# Patient Record
Sex: Male | Born: 1945 | Race: White | Hispanic: No | Smoking: Former smoker
Health system: Southern US, Community
[De-identification: ages and names within clinical notes are randomized; demographics above are authoritative.]

## PROBLEM LIST (undated history)

## (undated) DIAGNOSIS — I509 Heart failure, unspecified: Secondary | ICD-10-CM

## (undated) DIAGNOSIS — J84112 Idiopathic pulmonary fibrosis: Secondary | ICD-10-CM

## (undated) DIAGNOSIS — J449 Chronic obstructive pulmonary disease, unspecified: Secondary | ICD-10-CM

## (undated) DIAGNOSIS — E7800 Pure hypercholesterolemia, unspecified: Secondary | ICD-10-CM

## (undated) DIAGNOSIS — E119 Type 2 diabetes mellitus without complications: Secondary | ICD-10-CM

## (undated) DIAGNOSIS — R011 Cardiac murmur, unspecified: Secondary | ICD-10-CM

## (undated) DIAGNOSIS — F329 Major depressive disorder, single episode, unspecified: Secondary | ICD-10-CM

## (undated) DIAGNOSIS — I119 Hypertensive heart disease without heart failure: Secondary | ICD-10-CM

## (undated) DIAGNOSIS — Z659 Problem related to unspecified psychosocial circumstances: Secondary | ICD-10-CM

## (undated) DIAGNOSIS — R079 Chest pain, unspecified: Secondary | ICD-10-CM

## (undated) DIAGNOSIS — I10 Essential (primary) hypertension: Secondary | ICD-10-CM

## (undated) DIAGNOSIS — F32A Depression, unspecified: Secondary | ICD-10-CM

## (undated) DIAGNOSIS — K219 Gastro-esophageal reflux disease without esophagitis: Secondary | ICD-10-CM

## (undated) DIAGNOSIS — I2510 Atherosclerotic heart disease of native coronary artery without angina pectoris: Secondary | ICD-10-CM

## (undated) DIAGNOSIS — R001 Bradycardia, unspecified: Secondary | ICD-10-CM

## (undated) DIAGNOSIS — F419 Anxiety disorder, unspecified: Secondary | ICD-10-CM

## (undated) HISTORY — DX: Anxiety disorder, unspecified: F41.9

## (undated) HISTORY — PX: LUNG BIOPSY: SHX232

## (undated) HISTORY — DX: Pure hypercholesterolemia, unspecified: E78.00

## (undated) HISTORY — DX: Chest pain, unspecified: R07.9

## (undated) HISTORY — PX: CARDIAC CATHETERIZATION: SHX172

## (undated) HISTORY — DX: Major depressive disorder, single episode, unspecified: F32.9

## (undated) HISTORY — DX: Depression, unspecified: F32.A

## (undated) HISTORY — DX: Bradycardia, unspecified: R00.1

## (undated) HISTORY — DX: Hypertensive heart disease without heart failure: I11.9

## (undated) HISTORY — DX: Problem related to unspecified psychosocial circumstances: Z65.9

## (undated) HISTORY — PX: CORONARY ANGIOPLASTY: SHX604

## (undated) HISTORY — DX: Idiopathic pulmonary fibrosis: J84.112

## (undated) HISTORY — PX: OTHER SURGICAL HISTORY: SHX169

## (undated) HISTORY — PX: TESTICLE SURGERY: SHX794

## (undated) HISTORY — PX: EYE SURGERY: SHX253

## (undated) HISTORY — DX: Gastro-esophageal reflux disease without esophagitis: K21.9

## (undated) HISTORY — DX: Atherosclerotic heart disease of native coronary artery without angina pectoris: I25.10

---

## 2003-04-02 ENCOUNTER — Other Ambulatory Visit: Payer: Self-pay

## 2004-01-28 ENCOUNTER — Other Ambulatory Visit: Payer: Self-pay

## 2004-07-29 ENCOUNTER — Other Ambulatory Visit: Payer: Self-pay

## 2004-07-29 ENCOUNTER — Emergency Department: Payer: Self-pay | Admitting: Emergency Medicine

## 2005-02-02 DIAGNOSIS — F419 Anxiety disorder, unspecified: Secondary | ICD-10-CM | POA: Insufficient documentation

## 2005-02-02 DIAGNOSIS — F329 Major depressive disorder, single episode, unspecified: Secondary | ICD-10-CM | POA: Insufficient documentation

## 2006-01-27 ENCOUNTER — Encounter: Payer: Self-pay | Admitting: Cardiovascular Disease

## 2006-04-11 ENCOUNTER — Ambulatory Visit: Payer: Self-pay | Admitting: Ophthalmology

## 2006-04-18 ENCOUNTER — Ambulatory Visit: Payer: Self-pay | Admitting: Ophthalmology

## 2006-06-01 ENCOUNTER — Encounter: Payer: Self-pay | Admitting: Cardiovascular Disease

## 2006-06-07 ENCOUNTER — Encounter: Payer: Self-pay | Admitting: Cardiovascular Disease

## 2006-06-14 ENCOUNTER — Encounter: Payer: Self-pay | Admitting: Cardiovascular Disease

## 2006-06-14 ENCOUNTER — Inpatient Hospital Stay (HOSPITAL_COMMUNITY): Admission: EM | Admit: 2006-06-14 | Discharge: 2006-06-16 | Payer: Self-pay | Admitting: Emergency Medicine

## 2006-06-18 ENCOUNTER — Encounter: Payer: Self-pay | Admitting: Cardiovascular Disease

## 2006-07-05 ENCOUNTER — Encounter: Payer: Self-pay | Admitting: Cardiovascular Disease

## 2006-07-13 ENCOUNTER — Encounter: Payer: Self-pay | Admitting: Cardiovascular Disease

## 2006-09-10 ENCOUNTER — Encounter: Payer: Self-pay | Admitting: Cardiovascular Disease

## 2006-11-01 ENCOUNTER — Encounter: Payer: Self-pay | Admitting: Cardiovascular Disease

## 2006-11-19 ENCOUNTER — Inpatient Hospital Stay: Payer: Self-pay | Admitting: *Deleted

## 2006-11-19 ENCOUNTER — Other Ambulatory Visit: Payer: Self-pay

## 2006-11-26 ENCOUNTER — Encounter: Payer: Self-pay | Admitting: Cardiovascular Disease

## 2006-11-29 ENCOUNTER — Encounter: Payer: Self-pay | Admitting: Cardiovascular Disease

## 2007-01-24 ENCOUNTER — Encounter: Payer: Self-pay | Admitting: Cardiovascular Disease

## 2007-01-29 ENCOUNTER — Encounter: Payer: Self-pay | Admitting: Cardiovascular Disease

## 2007-02-05 ENCOUNTER — Ambulatory Visit: Payer: Self-pay | Admitting: *Deleted

## 2007-02-05 ENCOUNTER — Encounter: Payer: Self-pay | Admitting: Cardiovascular Disease

## 2007-02-13 ENCOUNTER — Other Ambulatory Visit: Payer: Self-pay

## 2007-02-14 ENCOUNTER — Inpatient Hospital Stay: Payer: Self-pay | Admitting: Internal Medicine

## 2007-04-02 ENCOUNTER — Encounter: Payer: Self-pay | Admitting: Cardiovascular Disease

## 2007-04-11 ENCOUNTER — Other Ambulatory Visit: Payer: Self-pay

## 2007-04-11 ENCOUNTER — Inpatient Hospital Stay: Payer: Self-pay | Admitting: *Deleted

## 2007-05-01 ENCOUNTER — Encounter: Payer: Self-pay | Admitting: Cardiovascular Disease

## 2007-06-05 ENCOUNTER — Encounter: Payer: Self-pay | Admitting: Cardiovascular Disease

## 2007-06-25 ENCOUNTER — Emergency Department: Payer: Self-pay | Admitting: Emergency Medicine

## 2007-06-25 ENCOUNTER — Other Ambulatory Visit: Payer: Self-pay

## 2007-06-26 ENCOUNTER — Encounter: Payer: Self-pay | Admitting: Cardiovascular Disease

## 2007-11-04 ENCOUNTER — Other Ambulatory Visit: Payer: Self-pay

## 2007-11-04 ENCOUNTER — Inpatient Hospital Stay: Payer: Self-pay | Admitting: *Deleted

## 2007-11-05 ENCOUNTER — Encounter: Payer: Self-pay | Admitting: Cardiovascular Disease

## 2007-11-05 LAB — CONVERTED CEMR LAB
Glucose, Bld: 84 mg/dL
HDL: 28 mg/dL
LDL Cholesterol: 24 mg/dL
Sodium: 142 meq/L

## 2007-11-18 ENCOUNTER — Encounter: Payer: Self-pay | Admitting: Cardiovascular Disease

## 2007-11-18 LAB — CONVERTED CEMR LAB
Chloride: 102 meq/L
Potassium: 4.1 meq/L

## 2007-11-20 ENCOUNTER — Ambulatory Visit: Payer: Self-pay | Admitting: Cardiovascular Disease

## 2007-11-20 ENCOUNTER — Encounter: Payer: Self-pay | Admitting: Cardiovascular Disease

## 2007-12-02 ENCOUNTER — Encounter: Payer: Self-pay | Admitting: Cardiovascular Disease

## 2007-12-31 ENCOUNTER — Encounter: Payer: Self-pay | Admitting: Cardiovascular Disease

## 2007-12-31 LAB — CONVERTED CEMR LAB
HDL: 32 mg/dL
Triglyceride fasting, serum: 109 mg/dL

## 2008-02-27 ENCOUNTER — Emergency Department: Payer: Self-pay | Admitting: Emergency Medicine

## 2008-04-01 ENCOUNTER — Encounter: Payer: Self-pay | Admitting: Cardiovascular Disease

## 2008-04-01 LAB — CONVERTED CEMR LAB
ALT: 23 units/L
AST: 27 units/L
Cholesterol: 117 mg/dL
LDL Cholesterol: 59 mg/dL
Total Bilirubin: 0.5 mg/dL
Total Protein: 7.3 g/dL
Triglyceride fasting, serum: 80 mg/dL

## 2008-04-03 ENCOUNTER — Encounter: Payer: Self-pay | Admitting: Cardiovascular Disease

## 2008-04-10 ENCOUNTER — Encounter: Payer: Self-pay | Admitting: Cardiovascular Disease

## 2008-07-13 ENCOUNTER — Ambulatory Visit: Payer: Self-pay | Admitting: Family Medicine

## 2008-07-20 ENCOUNTER — Encounter: Payer: Self-pay | Admitting: Cardiovascular Disease

## 2008-07-20 LAB — CONVERTED CEMR LAB
BUN: 12 mg/dL
Calcium: 9.7 mg/dL
Chloride: 104 meq/L
Cholesterol: 95 mg/dL
Creatinine, Ser: 1.2 mg/dL
Glucose, Bld: 85 mg/dL
HDL: 32 mg/dL
LDL Cholesterol: 36 mg/dL
Sodium: 142 meq/L
Triglyceride fasting, serum: 137 mg/dL

## 2008-08-13 ENCOUNTER — Encounter: Payer: Self-pay | Admitting: Cardiovascular Disease

## 2009-03-01 ENCOUNTER — Encounter: Payer: Self-pay | Admitting: Cardiovascular Disease

## 2009-03-01 LAB — CONVERTED CEMR LAB
BUN: 10 mg/dL
Creatinine, Ser: 1.11 mg/dL
Glucose, Bld: 98 mg/dL
Sodium: 147 meq/L

## 2009-03-08 ENCOUNTER — Encounter: Payer: Self-pay | Admitting: Cardiovascular Disease

## 2009-04-27 ENCOUNTER — Encounter: Payer: Self-pay | Admitting: Cardiovascular Disease

## 2009-05-31 ENCOUNTER — Encounter: Payer: Self-pay | Admitting: Cardiovascular Disease

## 2009-06-16 ENCOUNTER — Encounter: Payer: Self-pay | Admitting: Cardiovascular Disease

## 2009-06-18 ENCOUNTER — Encounter: Payer: Self-pay | Admitting: Cardiovascular Disease

## 2009-07-01 ENCOUNTER — Ambulatory Visit: Payer: Self-pay | Admitting: Cardiovascular Disease

## 2009-07-01 DIAGNOSIS — I2510 Atherosclerotic heart disease of native coronary artery without angina pectoris: Secondary | ICD-10-CM | POA: Insufficient documentation

## 2009-07-01 DIAGNOSIS — E785 Hyperlipidemia, unspecified: Secondary | ICD-10-CM | POA: Insufficient documentation

## 2009-07-01 DIAGNOSIS — R0602 Shortness of breath: Secondary | ICD-10-CM | POA: Insufficient documentation

## 2009-07-01 DIAGNOSIS — R0789 Other chest pain: Secondary | ICD-10-CM

## 2009-07-02 ENCOUNTER — Encounter: Payer: Self-pay | Admitting: Cardiovascular Disease

## 2009-07-12 ENCOUNTER — Telehealth (INDEPENDENT_AMBULATORY_CARE_PROVIDER_SITE_OTHER): Payer: Self-pay | Admitting: *Deleted

## 2009-07-13 ENCOUNTER — Ambulatory Visit: Payer: Self-pay

## 2009-07-13 ENCOUNTER — Encounter (HOSPITAL_COMMUNITY): Admission: RE | Admit: 2009-07-13 | Discharge: 2009-09-01 | Payer: Self-pay | Admitting: Cardiovascular Disease

## 2009-07-13 ENCOUNTER — Ambulatory Visit (HOSPITAL_COMMUNITY): Admission: RE | Admit: 2009-07-13 | Discharge: 2009-07-13 | Payer: Self-pay | Admitting: Cardiovascular Disease

## 2009-07-13 ENCOUNTER — Encounter: Payer: Self-pay | Admitting: Cardiovascular Disease

## 2009-07-13 ENCOUNTER — Ambulatory Visit: Payer: Self-pay | Admitting: Cardiology

## 2009-08-09 ENCOUNTER — Telehealth: Payer: Self-pay | Admitting: Cardiovascular Disease

## 2009-11-01 ENCOUNTER — Encounter: Payer: Self-pay | Admitting: Cardiovascular Disease

## 2009-11-02 ENCOUNTER — Telehealth (INDEPENDENT_AMBULATORY_CARE_PROVIDER_SITE_OTHER): Payer: Self-pay | Admitting: *Deleted

## 2009-11-09 ENCOUNTER — Ambulatory Visit: Payer: Self-pay | Admitting: Cardiovascular Disease

## 2009-11-11 ENCOUNTER — Encounter: Payer: Self-pay | Admitting: Cardiovascular Disease

## 2009-11-11 ENCOUNTER — Emergency Department: Payer: Medicare Other | Admitting: Emergency Medicine

## 2009-11-12 ENCOUNTER — Encounter: Payer: Self-pay | Admitting: Cardiovascular Disease

## 2009-11-15 ENCOUNTER — Telehealth: Payer: Self-pay | Admitting: Cardiovascular Disease

## 2009-11-15 ENCOUNTER — Encounter: Payer: Self-pay | Admitting: Cardiovascular Disease

## 2009-11-16 ENCOUNTER — Encounter: Payer: Self-pay | Admitting: Cardiovascular Disease

## 2010-02-03 ENCOUNTER — Ambulatory Visit: Payer: Medicare Other | Admitting: Family Medicine

## 2010-02-03 ENCOUNTER — Encounter: Payer: Self-pay | Admitting: Cardiovascular Disease

## 2010-03-09 ENCOUNTER — Telehealth: Payer: Self-pay | Admitting: Cardiovascular Disease

## 2010-03-09 ENCOUNTER — Ambulatory Visit: Payer: Self-pay | Admitting: Cardiology

## 2010-03-09 ENCOUNTER — Ambulatory Visit: Payer: Self-pay

## 2010-03-09 ENCOUNTER — Encounter: Payer: Self-pay | Admitting: Cardiovascular Disease

## 2010-03-09 ENCOUNTER — Ambulatory Visit (HOSPITAL_COMMUNITY): Admission: RE | Admit: 2010-03-09 | Discharge: 2010-03-09 | Payer: Self-pay

## 2010-03-14 ENCOUNTER — Telehealth: Payer: Self-pay | Admitting: Cardiovascular Disease

## 2010-05-05 ENCOUNTER — Encounter: Payer: Self-pay | Admitting: Cardiovascular Disease

## 2010-05-11 ENCOUNTER — Ambulatory Visit: Payer: Self-pay | Admitting: Cardiovascular Disease

## 2010-05-12 ENCOUNTER — Encounter: Payer: Self-pay | Admitting: Cardiovascular Disease

## 2010-05-12 DIAGNOSIS — R0989 Other specified symptoms and signs involving the circulatory and respiratory systems: Secondary | ICD-10-CM | POA: Insufficient documentation

## 2010-05-13 ENCOUNTER — Encounter: Payer: Self-pay | Admitting: Cardiovascular Disease

## 2010-05-13 LAB — CONVERTED CEMR LAB
ALT: 26 units/L (ref 0–53)
Bilirubin, Direct: 0.1 mg/dL (ref 0.0–0.3)
HDL: 33 mg/dL — ABNORMAL LOW (ref >39)

## 2010-05-16 ENCOUNTER — Encounter: Payer: Medicare Other | Admitting: Family Medicine

## 2010-05-24 ENCOUNTER — Telehealth: Payer: Self-pay | Admitting: Cardiovascular Disease

## 2010-05-29 ENCOUNTER — Encounter: Payer: Medicare Other | Admitting: Family Medicine

## 2010-05-31 ENCOUNTER — Telehealth: Payer: Self-pay | Admitting: Cardiovascular Disease

## 2010-06-04 ENCOUNTER — Emergency Department: Payer: Medicare Other | Admitting: Emergency Medicine

## 2010-06-07 ENCOUNTER — Ambulatory Visit: Admission: RE | Admit: 2010-06-07 | Discharge: 2010-06-07 | Payer: Self-pay | Source: Home / Self Care

## 2010-06-07 ENCOUNTER — Encounter: Payer: Self-pay | Admitting: Cardiovascular Disease

## 2010-06-13 ENCOUNTER — Encounter: Payer: Self-pay | Admitting: Cardiovascular Disease

## 2010-06-14 ENCOUNTER — Telehealth (INDEPENDENT_AMBULATORY_CARE_PROVIDER_SITE_OTHER): Payer: Self-pay | Admitting: *Deleted

## 2010-06-28 NOTE — Progress Notes (Signed)
Summary: [facility]  [facility]   Imported By: Harlon Flor 06/21/2009 11:45:34  _____________________________________________________________________  External Attachment:    Type:   Image     Comment:   External Document

## 2010-06-28 NOTE — Progress Notes (Signed)
Summary: Nuclear Pre-Procedure  Phone Note Outgoing Call   Call placed by: Milana Na, EMT-P,  July 12, 2009 4:02 PM Summary of Call: Reviewed information on Myoview Information Sheet (see scanned document for further details).  Spoke with patient.     Nuclear Med Background Indications for Stress Test: Evaluation for Ischemia   History: Asthma, COPD, Heart Catheterization, Myocardial Perfusion Study, Stents  History Comments: '04 STENTS numerous LAD  '08 Heart Cath EF 60% DX, distal LAD, residual RCA, CFX '08 MPS anterior ischemia '08 STENTS Prox/mid LAD  Symptoms: Chest Pain, DOE, Near Syncope, SOB    Nuclear Pre-Procedure Cardiac Risk Factors: Family History - CAD, Hypertension, Lipids Height (in): 69  Nuclear Med Study Referring MD:  T.Gollan

## 2010-06-28 NOTE — Progress Notes (Signed)
Summary: [facility]   Imported By: Harlon Flor 06/21/2009 10:42:20  _____________________________________________________________________  External Attachment:    Type:   Image     Comment:   External Document

## 2010-06-28 NOTE — Progress Notes (Signed)
Summary: [facility]  [facility]   Imported By: Harlon Flor 06/21/2009 11:50:28  _____________________________________________________________________  External Attachment:    Type:   Image     Comment:   External Document

## 2010-06-28 NOTE — Progress Notes (Signed)
Summary: The [facility]  The [facility]   Imported By: Harlon Flor 06/21/2009 11:39:53  _____________________________________________________________________  External Attachment:    Type:   Image     Comment:   External Document

## 2010-06-28 NOTE — Assessment & Plan Note (Signed)
Summary: ROV/GLC   Visit Type:  Follow-up Primary Provider:  Ezekiel Slocumb, MD  CC:  Chest pain and sob with exertion and left arm tingling.  History of Present Illness: Shane Reid is a 65 year old gentleman with a history of coronary artery disease, PCI with numerous stents, history of chronic chest pain which is musculoskeletal, history of traumatic injury to the chest, history of esophageal strictures, status post esophageal stretching several months ago, who presents for pain in his left neck radiating to the arm, numbness and tingling in his fingers.  Shane Reid states that his biggest complaint is pain radiating down the left side of his neck. If he turns his head far to the left he gets pain in a particular spot, this is worse with pushing on it. He has difficulty lifting his left arm straight up in the air that if he gets it with his good arm, on the right, he is able to lift it only up into the air. There is pain in his left neck when he raises his left arm.  He does report occasional episodes of chest discomfort, some shortness of breath with heavy exertion.  He had a stress test in February of 2011 that was negative.  Echocardiogram at the same time showed normal systolic function but no other significant abnormalities. this testing was done prior to his esophageal stretching and his symptoms seemed to resolve after his esophageal dilatation.  Current Medications (verified): 1)  Plavix 75 Mg Tabs (Clopidogrel Bisulfate) .... Take One Tablet By Mouth Daily 2)  Aspirin Ec 325 Mg Tbec (Aspirin) .... Take One Tablet By Mouth Daily 3)  Amlodipine Besy-Benazepril Hcl 5-10 Mg Caps (Amlodipine Besy-Benazepril Hcl) .[name].. 1 Tablet By Mouth Once Daily 4)  Crestor 20 Mg Tabs (Rosuvastatin Calcium) .... Take One Tablet By Mouth Daily. 5)  Zetia 10 Mg Tabs (Ezetimibe) .... Take One Tablet By Mouth Daily. 6)  Albuterol Sulfate (2.5 Mg/15ml) 0.083% Nebu (Albuterol Sulfate) 7)  Nitrostat 0.4 Mg  Subl (Nitroglycerin) .... As Needed 8)  Daily Vitamins  Tabs (Multiple Vitamin) .[name].. 1 Tab By Mouth Once Daily 9)  Omeprazole 20 Mg Cpdr (Omeprazole) .[name].. 1 Tab By Mouth Once Daily  Allergies (verified): 1)  ! Zoloft 2)  ! * Niaspan 3)  ! Metoprolol Succinate  Past History:  Past Medical History: Last updated: 06/18/2009 Anxiety Asthma C O P D - s/p stent 2008 Depression G E R D Social Stressors, wife with mental illness Hypercholesterolemia Bradycardia  Past Surgical History: Last updated: 07/01/2009 esophagus stretch testicle surgery  Family History: Last updated: 06/18/2009 Mother with CABG and CHF FH- Heart disease FH- Depression Father:  Mother:  Siblings:   Social History: Last updated: 06/18/2009 Divorced but lives with ex-wife On SSI disability, wife with mental illness Tobacco Use - No.  Alcohol Use - no  Risk Factors: Smoking Status: never (06/18/2009)  Review of Systems  The patient denies fever, weight loss, weight gain, vision loss, decreased hearing, hoarseness, chest pain, syncope, dyspnea on exertion, peripheral edema, prolonged cough, abdominal pain, incontinence, muscle weakness, depression, and enlarged lymph nodes.         left neck pain, arm numbness and tingling  Vital Signs:  Patient profile:   65 year old male Height:      69 inches Weight:      168 pounds BMI:     24.90 Pulse rate:   60 / minute BP sitting:   127 / 80  (left arm) Cuff size:  regular  Vitals Entered By: Bishop Dublin, CMA (November 09, 2009 2:30 PM)  Physical Exam  General:  Well developed, well nourished, in no acute distress. Head:  normocephalic and atraumatic Neck:  Neck supple, no JVD. No masses, thyromegaly or abnormal cervical nodes. pain with touching his left neck underneath his severe radiating halfway down the neck. Very tender to palpation. Chest Wall:  no deformities or breast masses noted Lungs:  Clear bilaterally to auscultation and  percussion. Heart:  Non-displaced PMI, chest non-tender; regular rate and rhythm, S1, S2 without murmurs, rubs or gallops. Carotid upstroke normal, no bruit.  Pedals normal pulses. No edema, no varicosities. Abdomen:  Bowel sounds positive; abdomen soft and non-tender without masses Msk:  Back normal, normal gait. Muscle strength and tone normal. Pulses:  pulses normal in all 4 extremities Extremities:  No clubbing or cyanosis. Neurologic:  Alert and oriented x 3. Skin:  Intact without lesions or rashes. Psych:  Normal affect.   New Orders:     1)  EKG w/ Interpretation (93000)   EKG  Procedure date:  11/09/2009  Findings:      sinus bradycardia with rate 54 beats a minute, no significant ST or T wave changes  Impression & Recommendations:  Problem # 1:  CAD, NATIVE VESSEL (ICD-414.01) history of coronary artery disease with intervention. Negative stress test in February 2011. Echo in February 2011 was essentially normal with normal systolic function. He did not have symptoms consistent today with angina. His main complaint is left neck pain which is clearly musculoskeletal. He has difficulty lifting his left arm, numbness and tingling and some of his fingers concerning for cervical disc disease. Have asked him to talk with his primary care physician or to seek a second opinion from orthopedics. it is okay for him to try ibuprofen on an occasional basis with his Vicodin and Flexeril that he already has. I also recommended that he ice his neck for discomfort.  His updated medication list for this problem includes:    Plavix 75 Mg Tabs (Clopidogrel bisulfate) .[name]... Take one tablet by mouth daily    Aspirin Ec 325 Mg Tbec (Aspirin) .[name]... Take one tablet by mouth daily    Amlodipine Besy-benazepril Hcl 5-10 Mg Caps (Amlodipine besy-benazepril hcl) .[name]... 1 tablet by mouth once daily    Nitrostat 0.4 Mg Subl (Nitroglycerin) .[name]... As needed  Orders: EKG w/ Interpretation  (93000)  Problem # 2:  HYPERLIPIDEMIA-MIXED (ICD-272.4) We'll continue him on his current medical regiment.  His updated medication list for this problem includes:    Crestor 20 Mg Tabs (Rosuvastatin calcium) .[name]... Take one tablet by mouth daily.    Zetia 10 Mg Tabs (Ezetimibe) .[name]... Take one tablet by mouth daily.  Patient Instructions: 1)  Your physician recommends that you continue on your current medications as directed. Please refer to the Current Medication list given to you today. 2)  Your physician wants you to follow-up in:   12 months You will receive a reminder letter in the mail two months in advance. If you don't receive a letter, please call our office to schedule the follow-up appointment.

## 2010-06-28 NOTE — Progress Notes (Signed)
Summary: The [facility]  [facility]   Imported By: Harlon Flor 06/21/2009 11:43:48  _____________________________________________________________________  External Attachment:    Type:   Image     Comment:   External Document

## 2010-06-28 NOTE — Progress Notes (Signed)
Summary: [facility]  [facility]   Imported By: Harlon Flor 07/01/2009 16:33:26  _____________________________________________________________________  External Attachment:    Type:   Image     Comment:   External Document

## 2010-06-28 NOTE — Assessment & Plan Note (Signed)
Summary: Cardiology Nuclear Study  Nuclear Med Background Indications for Stress Test: Evaluation for Ischemia   History: Asthma, COPD, Heart Catheterization, Myocardial Perfusion Study, Stents  History Comments: '04 STENTS numerous LAD  '08 Heart Cath EF 60% DX, distal LAD, residual RCA, CFX '08 MPS anterior ischemia '08 STENTS Prox/mid LAD  Symptoms: Chest Pain, DOE, Near Syncope, SOB    Nuclear Pre-Procedure Cardiac Risk Factors: Family History - CAD, Hypertension, Lipids Caffeine/Decaff Intake: None NPO After: 7:30 PM Lungs: clear IV 0.9% NS with Angio Cath: 18g     IV Site: (R) AC IV Started by: Stanton Kidney EMT-P Chest Size (in) 40     Height (in): 69 Weight (lb): 167 BMI: 24.75  Nuclear Med Study 1 or 2 day study:  1 day     Stress Test Type:  Eugenie Birks Reading MD:  Willa Rough, MD     Referring MD:  T.Gollan Resting Radionuclide:  Technetium 59m Tetrofosmin     Resting Radionuclide Dose:  11.0 mCi  Stress Radionuclide:  Technetium 37m Tetrofosmin     Stress Radionuclide Dose:  33.0 mCi   Stress Protocol   Lexiscan: 0.4 mg   Stress Test Technologist:  Milana Na EMT-P     Nuclear Technologist:  Harlow Asa CNMT  Rest Procedure  Myocardial perfusion imaging was performed at rest 45 minutes following the intravenous administration of Myoview Technetium 1m Tetrofosmin.  Stress Procedure  The patient received IV Lexiscan 0.4 mg over 15-seconds.  Myoview injected at 30-seconds.  There were no significant changes and a rare pac with infusion.  Quantitative spect images were obtained after a 45 minute delay.  QPS Raw Data Images:  Normal; no motion artifact; normal heart/lung ratio. Stress Images:  There is normal uptake in all areas. Rest Images:  Normal homogeneous uptake in all areas of the myocardium. Subtraction (SDS):  No evidence of ischemia. Transient Ischemic Dilatation:  1.00  (Normal <1.22)  Lung/Heart Ratio:  .31  (Normal  <0.45)  Quantitative Gated Spect Images QGS EDV:  107 ml QGS ESV:  45 ml QGS EF:  58 % QGS cine images:  Normal motion  Findings Normal nuclear study      Overall Impression  Exercise Capacity: lexiscan BP Response: Normal blood pressure response. Clinical Symptoms: SOB ECG Impression: No significant ST segment change suggestive of ischemia. Overall Impression: Normal stress nuclear study.

## 2010-06-28 NOTE — Progress Notes (Signed)
Summary: [facility]  The [facility]   Imported By: Harlon Flor 06/21/2009 11:49:56  _____________________________________________________________________  External Attachment:    Type:   Image     Comment:   External Document

## 2010-06-28 NOTE — Letter (Signed)
Summary: [facility]  [facility]   Imported By: Harlon Flor 11/17/2009 07:52:04  _____________________________________________________________________  External Attachment:    Type:   Image     Comment:   External Document

## 2010-06-28 NOTE — Miscellaneous (Signed)
  Clinical Lists Changes  Observations: Added new observation of TRIGLYCERIDE: 143 mg/dL (04/54/0981 19:14) Added new observation of HDL: 28 mg/dL (78/29/5621 30:86) Added new observation of LDL: 24 mg/dL (57/84/6962 95:28) Added new observation of CHOLESTEROL: 81 mg/dL (41/32/4401 02:72) Added new observation of CALCIUM: 8.5 mg/dL (53/66/4403 47:42) Added new observation of CREATININE: 1.20 mg/dL (59/56/3875 64:33) Added new observation of BUN: 9 mg/dL (29/51/8841 66:06) Added new observation of CO2 PLSM/SER: 29 meq/L (11/05/2007 12:35) Added new observation of CL SERUM: 107 meq/L (11/05/2007 12:35) Added new observation of K SERUM: 3.5 meq/L (11/05/2007 12:35) Added new observation of NA: 142 meq/L (11/05/2007 12:35) Added new observation of BG RANDOM: 84 mg/dL (30/16/0109 32:35)

## 2010-06-28 NOTE — Letter (Signed)
Summary: [facility]  [facility]   Imported By: Harlon Flor 11/17/2009 07:53:42  _____________________________________________________________________  External Attachment:    Type:   Image     Comment:   External Document

## 2010-06-28 NOTE — Progress Notes (Signed)
Summary: Pt has been in the ER  Phone Note Call from Patient   Caller: Spouse Summary of Call: The pt's wife called today stating the pt was seen in the [facility] ER on saturday she thought. She wants [place] to call Neysa Bonito -RN at the [facility] with Dr. Tenny Craw. Ms. Trevino states that Dr. Lucina Mellow told them the pt was very close to having a heart attack. I offered to make an appt for the pt. She wants [place] to get the hospital records first. I will try to contact Christy at [phone]. Initial call taken by: Sherri Rad, RN, BSN,  November 15, 2009 4:52 PM  Follow-up for Phone Call        I called the [facility]. Neysa Bonito has not been in today. I have left a message for Christy to call. Sherri Rad, RN, BSN  November 15, 2009 4:56 PM   recieved records from [place]. Benedict Needy, RN  November 18, 2009 10:53 AM

## 2010-06-28 NOTE — Progress Notes (Signed)
Summary: pt. calling chest and neck pain       Additional Follow-up for Phone Call Additional follow up Details #2::    cma s/w pt. regarding his chest and neck pain with exercise induced sob.  He was given a Rx. for Vicodin 5/500 mg and Flexeril by Dr. Butler Denmark at his appt. yesterday regarding neck and shoulder pain.  Patient states medication is not helping. He wants to see Dr. Mariah Milling for his chest and neck pain.  I suggested he go to ED if pain continues. The patient will call tomorrow if symptoms continue. Patient gave verbal understanding. Bishop Dublin, CMA  November 02, 2009 3:51 PM    Appended Document: pt. calling chest and neck pain         Additional Follow-up for Phone Call Additional follow up Details #2::    s/w pt's wife regarding pt's chest and neck pain with sob and today c/o left arm numbness and tingling.  s/w Dr. Mariah Milling regarding symptoms and he advised pt. to call Dr. Butler Denmark felt symptoms were non cardiac.  Pt's wife vebally understood. Bishop Dublin, CMA  November 03, 2009 4:34 PM

## 2010-06-28 NOTE — Letter (Signed)
Summary: [facility]   Imported By: Harlon Flor 02/11/2010 16:02:30  _____________________________________________________________________  External Attachment:    Type:   Image     Comment:   External Document

## 2010-06-28 NOTE — Procedures (Signed)
Summary: The [facility]  The [facility]   Imported By: Harlon Flor 06/21/2009 13:28:33  _____________________________________________________________________  External Attachment:    Type:   Image     Comment:   External Document

## 2010-06-28 NOTE — Progress Notes (Signed)
Summary: The [facility]  The [facility]   Imported By: Harlon Flor 06/21/2009 11:59:07  _____________________________________________________________________  External Attachment:    Type:   Image     Comment:   External Document

## 2010-06-28 NOTE — Cardiovascular Report (Signed)
Summary: The [facility]  The [facility]   Imported By: Harlon Flor 06/21/2009 13:09:54  _____________________________________________________________________  External Attachment:    Type:   Image     Comment:   External Document

## 2010-06-28 NOTE — Progress Notes (Signed)
Summary: [facility]  [facility]   Imported By: Harlon Flor 06/21/2009 10:42:58  _____________________________________________________________________  External Attachment:    Type:   Image     Comment:   External Document

## 2010-06-28 NOTE — Progress Notes (Signed)
Summary: The [facility] & [facility] Center  The [facility]   Imported By: Harlon Flor 06/21/2009 11:41:15  _____________________________________________________________________  External Attachment:    Type:   Image     Comment:   External Document

## 2010-06-28 NOTE — Progress Notes (Signed)
Summary: PHI  PHI   Imported By: Harlon Flor 07/01/2009 16:33:40  _____________________________________________________________________  External Attachment:    Type:   Image     Comment:   External Document

## 2010-06-28 NOTE — Progress Notes (Signed)
Summary: The [facility] & [facility] Center  The [facility]   Imported By: Harlon Flor 06/21/2009 11:52:54  _____________________________________________________________________  External Attachment:    Type:   Image     Comment:   External Document

## 2010-06-28 NOTE — Progress Notes (Signed)
Summary: The [facility] & [facility] Center  The [facility]   Imported By: Harlon Flor 06/21/2009 11:53:21  _____________________________________________________________________  External Attachment:    Type:   Image     Comment:   External Document

## 2010-06-28 NOTE — Letter (Signed)
Summary: [facility]   Imported By: Harlon Flor 11/17/2009 07:52:36  _____________________________________________________________________  External Attachment:    Type:   Image     Comment:   External Document

## 2010-06-28 NOTE — Progress Notes (Signed)
Summary: The [facility]  The [facility] Center   Imported By: Harlon Flor 06/21/2009 11:46:01  _____________________________________________________________________  External Attachment:    Type:   Image     Comment:   External Document

## 2010-06-28 NOTE — Progress Notes (Signed)
Summary: The [facility] & [facility] Center  The [facility]   Imported By: Harlon Flor 06/21/2009 11:48:52  _____________________________________________________________________  External Attachment:    Type:   Image     Comment:   External Document

## 2010-06-28 NOTE — Progress Notes (Signed)
  Phone Note Other Incoming   Summary of Call: 03/09/10--4pm--pt here for ECHO  Initial call taken by: Ledon Snare, RN,  March 09, 2010 4:57 PM  Follow-up for Phone Call        pt became dizzy in lobby when signing in for ECHO--pt states this happens 7-8 times per day and he just puts his head down until it passes--EKG done , which shows sinus rhythm--pulse 60--BP--150/80--pt taken to ECHO following EKG--appeared to be fine with no syncope --EKG and vitals shown to dr wall(DOD) and all information shared with dr Sallee Lange in [place]--per dr Sallee Lange, ok to send pt home after ECHO--nt Follow-up by: Ledon Snare, RN,  March 09, 2010 5:06 PM     Appended Document:  discussed, agree with plan.

## 2010-06-28 NOTE — Progress Notes (Signed)
Summary: The [facility]  The [facility]   Imported By: Harlon Flor 06/21/2009 11:53:49  _____________________________________________________________________  External Attachment:    Type:   Image     Comment:   External Document

## 2010-06-28 NOTE — Progress Notes (Signed)
Summary: The [facility]  The [facility]   Imported By: Harlon Flor 06/21/2009 11:52:29  _____________________________________________________________________  External Attachment:    Type:   Image     Comment:   External Document

## 2010-06-28 NOTE — Progress Notes (Signed)
Summary: The [facility]  The [facility]   Imported By: Harlon Flor 06/21/2009 11:58:33  _____________________________________________________________________  External Attachment:    Type:   Image     Comment:   External Document

## 2010-06-28 NOTE — Progress Notes (Signed)
Summary: The [facility] & [facility] Center  The [facility] Center   Imported By: Harlon Flor 06/21/2009 11:45:01  _____________________________________________________________________  External Attachment:    Type:   Image     Comment:   External Document

## 2010-06-28 NOTE — Progress Notes (Signed)
Summary: The [facility]  The [facility]   Imported By: Harlon Flor 06/21/2009 11:40:21  _____________________________________________________________________  External Attachment:    Type:   Image     Comment:   External Document

## 2010-06-28 NOTE — Progress Notes (Signed)
Summary: The [facility]  The [facility]   Imported By: Harlon Flor 06/21/2009 11:40:47  _____________________________________________________________________  External Attachment:    Type:   Image     Comment:   External Document

## 2010-06-28 NOTE — Progress Notes (Signed)
Summary: PROBLEMS  Phone Note Call from Patient Call back at Home Phone [phone]   Caller: [facility] Call For: [facility] Summary of Call: PTS WIFE SAID THAT THE PT HAS BEEN ACTING WIERD SINCE GETTING RADIATION FROM A PROCEDURE PUT IN HIM LAST WEEK-CANNOT USE THE BATHROOM-PLEASE CALL THE PT BUT DO NOT LET HIM KNOW THAT MARIA CALLED ABOUT HIM. Initial call taken by: Harlon Flor,  August 09, 2009 9:45 AM  Follow-up for Phone Call        Byrd Hesselbach states that since M. Blanke had stress test he is not aciting right- she is afraid for her safety.  pt is not eating, unable to have BM (is taking laxatives).  Informed Byrd Hesselbach that these are not side effects of lexiscan and that Mr. Utsey needs to be seen by PCP.  will call back and speak to Mr. Creely personally at 11:30. Follow-up by: Charlena Cross, RN, BSN,  August 09, 2009 10:01 AM  Additional Follow-up for Phone Call Additional follow up Details #1::        spoke with Mr. Milstein- states that he has had diarrhea and no appetite since Wednesday when he ate a TV dinner and nutty bar.  Instructed pt to increase fluid intake and that if symptoms persist to see PCP.   Additional Follow-up by: Charlena Cross, RN, BSN,  August 09, 2009 12:48 PM

## 2010-06-28 NOTE — Progress Notes (Signed)
Summary: [facility]  [facility]   Imported By: Harlon Flor 06/21/2009 10:44:02  _____________________________________________________________________  External Attachment:    Type:   Image     Comment:   External Document

## 2010-06-28 NOTE — Progress Notes (Signed)
Summary: APPT?  Phone Note Call from Patient Call back at Home Phone [phone]   Caller: SELF Call For: [facility] Summary of Call: PT WANTS TO KNOW IF HE NEEDS TO MAKE A F/U APPT BECAUSE [facility] CALLED AND TOLD THE PT THAT HIS TESTS WERE ABNORMAL Initial call taken by: Harlon Flor,  March 14, 2010 9:50 AM  Follow-up for Phone Call        [facility] Benedict Needy, RN  March 14, 2010 3:32 PM.  Mr. Kleve would like an appointment to discuss Echo & EKG.  He has an appt. for March 25, 2010. Follow-up by: Bishop Dublin, CMA,  March 15, 2010 9:31 AM

## 2010-06-28 NOTE — Progress Notes (Signed)
Summary: [facility]  [facility]   Imported By: Harlon Flor 06/21/2009 11:49:23  _____________________________________________________________________  External Attachment:    Type:   Image     Comment:   External Document

## 2010-06-28 NOTE — Medication Information (Signed)
Summary: MED LIST  MED LIST   Imported By: Harlon Flor 06/21/2009 12:20:55  _____________________________________________________________________  External Attachment:    Type:   Image     Comment:   External Document

## 2010-06-28 NOTE — Assessment & Plan Note (Signed)
Summary: NP6/AMD   Visit Type:  New Patient Primary Provider:  Ezekiel Slocumb, MD  CC:  chest pains, sob with little exertion, and feel like going to pass out - hard to catch breath.  History of Present Illness: Shane Reid is a 65 year old gentleman with a history of coronary artery disease, PCI with numerous stents, history of chronic chest pain which is musculoskeletal, history of traumatic injury to the chest, history of esophageal strictures, status post esophageal stretching, who presents for worsening shortness of breath.   Shane Reid states that his breathing is getting worse than his baseline. He is unable to walk from the office to his car or to get his mail without worsening shortness of breath. He has to take frequent breaks to recap she is breath. He also has been having worsening chest discomfort that is different from his chronic musculoskeletal chest pain. We took him off his metoprolol and his last clinic visit for bradycardia and this did not seem to help his symptoms. He has recently seen his physician and the retching of recurrence of his esophageal stricture was made no details on having a procedure done is not certain.  He describes the chest pain as in his left side of the chest, no radiation to the arm or neck. He states clearly that the shortness of breath is new and getting worse. He does have some component similar to how he presented before prior to stent placement.  Current Medications (verified): 1)  Plavix 75 Mg Tabs (Clopidogrel Bisulfate) .... Take One Tablet By Mouth Daily 2)  Aspirin Ec 325 Mg Tbec (Aspirin) .... Take One Tablet By Mouth Daily 3)  Amlodipine Besy-Benazepril Hcl 5-10 Mg Caps (Amlodipine Besy-Benazepril Hcl) .[name].. 1 Tablet By Mouth Once Daily 4)  Crestor 20 Mg Tabs (Rosuvastatin Calcium) .... Take One Tablet By Mouth Daily. 5)  Zetia 10 Mg Tabs (Ezetimibe) .... Take One Tablet By Mouth Daily. 6)  Albuterol Sulfate 2 Mg Tabs (Albuterol Sulfate) ....  As Needed 7)  Nitrostat 0.4 Mg Subl (Nitroglycerin) .... As Needed 8)  Daily Vitamins  Tabs (Multiple Vitamin) .[name].. 1 Tab By Mouth Once Daily 9)  Omeprazole 20 Mg Cpdr (Omeprazole) .[name].. 1 Tab By Mouth Once Daily  Allergies (verified): 1)  ! Zoloft 2)  ! * Niaspan  Past History:  Past Surgical History: esophagus stretch testicle surgery  Family History: Reviewed history from 06/18/2009 and no changes required. Mother with CABG and CHF FH- Heart disease FH- Depression Father:  Mother:  Siblings:   Social History: Reviewed history from 06/18/2009 and no changes required. Divorced but lives with ex-wife On SSI disability, wife with mental illness Tobacco Use - No.  Alcohol Use - no  Review of Systems       The patient complains of chest pain and dyspnea on exertion.  The patient denies anorexia, fever, weight loss, weight gain, vision loss, decreased hearing, hoarseness, syncope, peripheral edema, prolonged cough, headaches, hemoptysis, abdominal pain, melena, hematochezia, severe indigestion/heartburn, hematuria, incontinence, genital sores, muscle weakness, suspicious skin lesions, transient blindness, difficulty walking, depression, unusual weight change, abnormal bleeding, enlarged lymph nodes, angioedema, breast masses, and testicular masses.    Vital Signs:  Patient profile:   65 year old male Height:      69 inches Weight:      167.75 pounds BMI:     24.86 Pulse rate:   54 / minute Pulse rhythm:   regular BP sitting:   122 / 80  (left arm) Cuff size:  regular  Vitals Entered By: Shane Reid (July 01, 2009 11:02 AM)  Physical Exam  General:  thin gentleman appearing older than his stated age, in no apparent distress, HEENT exam is benign, oropharynx is clear, neck is supple with no JVP or carotid bruits, heart sounds are regular with normal S1-S2 and no murmurs appreciated, lung sounds are clear to auscultation with no wheezes Rales, abdominal exam is  benign with no tenderness on palpation otherwise benign, no significant lower extremity edema, pulses are equal and symmetrical in his upper and lower extremities, neurological exams grossly nonfocal, skin is warm and dry.   Impression & Recommendations:  Problem # 1:  SHORTNESS OF BREATH (ICD-786.05) Shane Reid has worsening shortness of breath and worsening episodes of chest discomfort concerning for angina. He has numerous stents and known coronary artery disease. We have set him up for an echocardiogram to rule out valvular pathology and new wall motion abnormality. We have scheduled him also for a pharmacologic stress test to rule out ischemia. We will call him with the results of the studies as soon as they are available. The following medications were removed from the medication list:    Metoprolol Succinate 25 Mg Xr24h-tab (Metoprolol succinate) .[name]... Take 1/2 tablet by mouth daily His updated medication list for this problem includes:    Aspirin Ec 325 Mg Tbec (Aspirin) .[name]... Take one tablet by mouth daily    Amlodipine Besy-benazepril Hcl 5-10 Mg Caps (Amlodipine besy-benazepril hcl) .[name]... 1 tablet by mouth once daily  Orders: Echocardiogram (Echo)  Problem # 2:  CHEST PAIN UNSPECIFIED (ICD-786.50) Shane Reid does have chronic chest pain is musculoskeletal in nature due to a history of traumatic injury. This is unchanged though his new presentation of shortness of breath and chest pain is different from his usual chest pain symptoms. The following medications were removed from the medication list:    Metoprolol Succinate 25 Mg Xr24h-tab (Metoprolol succinate) .[name]... Take 1/2 tablet by mouth daily His updated medication list for this problem includes:    Plavix 75 Mg Tabs (Clopidogrel bisulfate) .[name]... Take one tablet by mouth daily    Aspirin Ec 325 Mg Tbec (Aspirin) .[name]... Take one tablet by mouth daily    Amlodipine Besy-benazepril Hcl 5-10 Mg Caps (Amlodipine besy-benazepril hcl)  .[name]... 1 tablet by mouth once daily    Nitrostat 0.4 Mg Subl (Nitroglycerin) .[name]... As needed  Orders: Nuclear Stress Test (Nuc Stress Test)  Problem # 3:  CAD, NATIVE VESSEL (ICD-414.01) Mr. Donavan Foil and does have native vessel disease with history of PCI with numerous stents placed. The details of his catheterizations at [facility] are being faxed to the office for further evaluation. We will continue him on his aspirin and Plavix, metoprolol is being held due to bradycardia. The following medications were removed from the medication list:    Metoprolol Succinate 25 Mg Xr24h-tab (Metoprolol succinate) .[name]... Take 1/2 tablet by mouth daily His updated medication list for this problem includes:    Plavix 75 Mg Tabs (Clopidogrel bisulfate) .[name]... Take one tablet by mouth daily    Aspirin Ec 325 Mg Tbec (Aspirin) .[name]... Take one tablet by mouth daily    Amlodipine Besy-benazepril Hcl 5-10 Mg Caps (Amlodipine besy-benazepril hcl) .[name]... 1 tablet by mouth once daily    Nitrostat 0.4 Mg Subl (Nitroglycerin) .[name]... As needed  Problem # 4:  HYPERLIPIDEMIA-MIXED (ICD-272.4) we will try to obtain his most recent cholesterol panel from his prior office and from Dr. Butler Denmark  for our records. His updated medication list for this problem includes:    Crestor 20 Mg Tabs (Rosuvastatin calcium) .[name]... Take one tablet by mouth daily.    Zetia 10 Mg Tabs (Ezetimibe) .[name]... Take one tablet by mouth daily.  New Orders:     1)  Echocardiogram (Echo)     2)  Nuclear Stress Test (Nuc Stress Test)   Patient Instructions: 1)  Your physician recommends that you schedule a follow-up appointment in: 6 months 2)  Your physician has requested that you have an echocardiogram.  Echocardiography is a painless test that uses sound waves to create images of your heart. It provides your doctor with information about the size and shape of your heart and how well your heart's chambers and valves are working.  This procedure takes  approximately one hour. There are no restrictions for this procedure. 3)  Your physician has requested that you have a lexiscan myoview.  For further information please visit [email or web address]  Please follow instruction sheet, as given.  Appended Document: NP6/AMD    Clinical Lists Changes  Medications: Changed medication from ALBUTEROL SULFATE 2 MG TABS (ALBUTEROL SULFATE) as needed to ALBUTEROL SULFATE (2.5 MG/3ML) 0.083% NEBU (ALBUTEROL SULFATE)

## 2010-06-30 NOTE — Progress Notes (Signed)
Summary: Crestor  Phone Note Call from Patient Call back at Home Phone [phone]   Caller: Shane Reid Call For: Shane Reid Summary of Call: Pt has been having a headache ever since he was told to start taking Crestor.  Please press *[phone] before calling bc the phone number is private. Initial call taken by: Harlon Flor,  May 24, 2010 1:37 PM  Follow-up for Phone Call        Pt has been on Crestor >1year. We stopped Zetia and cut Crestor in HALF after lipid results were very low. Pt's wife states since then he has had HA's. Recc pt take Tylenol to see if resolves. Pt's wife states he refuses to take. Pt states BP is normal. Only c/o is HA. Advised he take Tylenol and if does not resolve pain then could look into further. Follow-up by: Lanny Hurst RN,  May 24, 2010 4:05 PM

## 2010-06-30 NOTE — Progress Notes (Signed)
Summary: Pt has cold symptoms  Phone Note Call from Patient Call back at Home Phone [phone]   Caller: Shane Reid (Wife) Call For: Shane Reid Summary of Call: Pt needs some medication for a bad cold.  Pt has tried to call [facility] and he cannot be seen.  Shane Reid wants the pt to call in [place].  Pt wants to know if quitting Zetia and taking half of a Crestor could cause these symptoms.  Pt is bleeding rectally from diarrhea. Initial call taken by: Harlon Flor,  May 31, 2010 3:21 PM  Follow-up for Phone Call        Spoke to pt's wife, she states pt saw pcp Dr. Lucina Mellow and received appropriate medication. Explained to her that his change in cholesterol meds should not have been causing his symptoms. She is ok with this. Follow-up by: Lanny Hurst RN,  June 03, 2010 9:36 AM

## 2010-06-30 NOTE — Letter (Signed)
Summary: Shane Reid [facility] [place]   Shane Reid [facility]   Imported By: Roderic Ovens 06/09/2010 13:57:05  _____________________________________________________________________  External Attachment:    Type:   Image     Comment:   External Document

## 2010-06-30 NOTE — Assessment & Plan Note (Signed)
Summary: F/U PER PCP   Visit Type:  Follow-up Primary Provider:  Ezekiel Slocumb, MD  CC:  c/o chest pain, head, and neck & jaw pain; has noticed for 5-6 months.  .  History of Present Illness: Shane Reid is a 65 year old gentleman with a history of coronary artery disease, PCI with numerous stents, history of chronic chest pain which is musculoskeletal, history of traumatic injury to the chest, history of esophageal strictures, status post esophageal stretching several months ago, who presents for pain in his left neck radiating to the arm, and shortness of breath.  He continues to have pain down the left side of his head into the left neck that is worse with turning his head and with palpation. He has been scheduled to start physical therapy next week. When asked about his chest pain, he did not comment that this was a significant problem at this time. he does comment on having significant shortness of breath and dizziness with exertion. He is unable to walk very far at all without having to stop. This has been coming on for over one year. He has not had pulmonary function tests. He does have a remote smoking history, significant exposure to kerosene smoke.  shortness of breath did not improve with diuretic. Echocardiogram 2 months ago was normal.  He was ambulated with monitoring in the office and maintained his oxygen level in the high 90s with exertion, heart rates stayed in the 70s. Blood pressure was stable with systolics in the 160s after exertion. Interestingly, he had significant symptoms of dizziness, severe shortness of breath and had to sit down.   He had a stress test in February of 2011 that was negative. Myoview study.  chest x-ray November 12 2009 shows hyperinflation of the lungs consistent with COPD, areas of fibrosis appear present as well  echocardiogram done in October 2011 showed normal systolic function, diastolic relaxation abnormality otherwise no significant  abnormalities  EKG shows normal sinus rhythm with rate 54 beats per minute, voltage consistent with LVH, no significant ST or T wave changes   Current Medications (verified): 1)  Plavix 75 Mg Tabs (Clopidogrel Bisulfate) .... Take One Tablet By Mouth Daily 2)  Aspirin Ec 325 Mg Tbec (Aspirin) .... Take One Tablet By Mouth Daily 3)  Amlodipine Besy-Benazepril Hcl 5-10 Mg Caps (Amlodipine Besy-Benazepril Hcl) .[name].. 1 Tablet By Mouth Once Daily 4)  Crestor 20 Mg Tabs (Rosuvastatin Calcium) .... Take One Tablet By Mouth Daily. 5)  Zetia 10 Mg Tabs (Ezetimibe) .... Take One Tablet By Mouth Daily. 6)  Albuterol Sulfate (2.5 Mg/19ml) 0.083% Nebu (Albuterol Sulfate) 7)  Nitrostat 0.4 Mg Subl (Nitroglycerin) .... As Needed 8)  Daily Vitamins  Tabs (Multiple Vitamin) .[name].. 1 Tab By Mouth Once Daily 9)  Omeprazole 20 Mg Cpdr (Omeprazole) .[name].. 1 Tab By Mouth Once Daily  Allergies (verified): 1)  ! Zoloft 2)  ! * Niaspan 3)  ! Metoprolol Succinate  Past History:  Past Surgical History: Last updated: 07/01/2009 esophagus stretch testicle surgery  Family History: Last updated: 06/18/2009 Mother with CABG and CHF FH- Heart disease FH- Depression Father:  Mother:  Siblings:   Social History: Last updated: 06/18/2009 Divorced but lives with ex-wife On SSI disability, wife with mental illness Tobacco Use - No.  Alcohol Use - no  Risk Factors: Smoking Status: never (06/18/2009)  Past Medical History: Anxiety Asthma CAD - s/p stent 2008 Depression [employer] Social Stressors, wife with mental illness Hypercholesterolemia Bradycardia  Review of  Systems       The patient complains of dyspnea on exertion.  The patient denies fever, weight loss, weight gain, vision loss, decreased hearing, hoarseness, chest pain, syncope, peripheral edema, prolonged cough, abdominal pain, incontinence, muscle weakness, depression, and enlarged lymph nodes.         Dizzy  Vital Signs:  Patient  profile:   65 year old male Height:      69 inches Weight:      172 pounds Pulse rate:   54 / minute BP sitting:   146 / 78  (left arm) Cuff size:   regular  Vitals Entered By: Lysbeth Galas CMA (May 12, 2010 10:09 AM)  Physical Exam  General:  Well developed, well nourished, in no acute distress. Head:  normocephalic and atraumatic Neck:  Neck supple, no JVD. No masses, thyromegaly or abnormal cervical nodes. pain with touching his left neck. Very tender to palpation. Lungs:  Clear bilaterally to auscultation and percussion. Heart:  Non-displaced PMI, chest non-tender; regular rate and rhythm, S1, S2 without murmurs, rubs or gallops. Carotid upstroke normal, 1+ bruit on the left.  Pedals normal pulses. No edema, no varicosities. Abdomen:  Bowel sounds positive; abdomen soft and non-tender without masses Msk:  Back normal, normal gait. Muscle strength and tone normal. Pulses:  pulses normal in all 4 extremities Extremities:  No clubbing or cyanosis. Neurologic:  Alert and oriented x 3. Skin:  Intact without lesions or rashes. Psych:  Normal affect.   Impression & Recommendations:  Problem # 1:  SHORTNESS OF BREATH (ICD-786.05) Cause of his shortness of breath is uncertain. He maintained his oxygen saturation and heart rate as well as blood pressure with exertion but was very symptomatic. He has had recent stress test and echocardiogram that looked normal. He does not have significant symptoms of chest pain with exertion at this time. he would not be able to perform a treadmill for further evaluation.  His updated medication list for this problem includes:    Aspirin Ec 325 Mg Tbec (Aspirin) .[name]... Take one tablet by mouth daily    Amlodipine Besy-benazepril Hcl 5-10 Mg Caps (Amlodipine besy-benazepril hcl) .[name]... 1 tablet by mouth once daily  Problem # 2:  CAROTID BRUIT, LEFT (ICD-785.9) he has a bruit on the left. We will order a carotid ultrasound.  Orders: Carotid Duplex  (Carotid Duplex)  Problem # 3:  CAD, NATIVE VESSEL (ICD-414.01) History of coronary artery disease with stent placed in 2008. Continue aggressive lipid management.  His updated medication list for this problem includes:    Plavix 75 Mg Tabs (Clopidogrel bisulfate) .[name]... Take one tablet by mouth daily    Aspirin Ec 325 Mg Tbec (Aspirin) .[name]... Take one tablet by mouth daily    Amlodipine Besy-benazepril Hcl 5-10 Mg Caps (Amlodipine besy-benazepril hcl) .[name]... 1 tablet by mouth once daily    Nitrostat 0.4 Mg Subl (Nitroglycerin) .[name]... As needed  Other Orders: EKG w/ Interpretation (93000) T-Lipid Profile ([id number]) T-Hepatic Function [id number])  Patient Instructions: 1)  Your physician recommends that you schedule a follow-up appointment in: 6 months 2)  Your physician recommends that you continue on your current medications as directed. Please refer to the Current Medication list given to you today. 3)  Your physician has requested that you have a carotid duplex. This test is an ultrasound of the carotid arteries in your neck. It looks at blood flow through these arteries that supply the brain with blood. Allow one hour for this exam. There are  no restrictions or special instructions.

## 2010-06-30 NOTE — Progress Notes (Signed)
Summary: u/s results  Phone Note Call from Patient Call back at Home Phone [phone]   Caller: Patient Call For: Jasmine December Summary of Call: pt wants to know results of u/s.  Initial call taken by: Lysbeth Galas CMA,  June 14, 2010 2:03 PM  Follow-up for Phone Call        Notified patient of Carotid U/S. Follow-up by: Bishop Dublin, CMA,  June 14, 2010 3:21 PM

## 2010-06-30 NOTE — Letter (Signed)
Summary: [employer] at [address]   [place], [place] [address]   Phone: [phone]  Fax: [phone]        June 13, 2010 MRN: [id number]    [facility] [address] [place], [place]  [address]    Dear Mr. Lacewell,  Dr. Mariah Milling has reviewed your carotid ultrasound and the results are normal.        Sincerely,  Lanny Hurst RN

## 2010-06-30 NOTE — Progress Notes (Signed)
Summary: Cold  Phone Note Call from Patient Call back at [phone]   Caller: Spouse Call For: Nurse/Gollan Summary of Call: Pt caught a cold from grandchildren and would like antibiotics. Pharmacy told them to call us. Initial call taken by: Lysbeth Galas CMA,  May 31, 2010 8:07 AM  Follow-up for Phone Call        Call PMD, Dr. Butler Denmark

## 2010-07-08 ENCOUNTER — Telehealth: Payer: Self-pay | Admitting: Cardiovascular Disease

## 2010-07-14 NOTE — Progress Notes (Signed)
Summary: RX problems  Phone Note Call from Patient Call back at Home Phone [phone]   Caller: Byrd Hesselbach Call For: Mariah Milling Summary of Call: Pt flushed Amlodepine, Crestor, and Plavix down the commode bc the RX was wrong.  Pt is supposed to take 1/2 a pill and the pharmacy won't fill it that way bc the RX does not say that.  [employer] # [phone] ask for Bufford Lope. Initial call taken by: [place],  July 08, 2010 1:47 PM  Follow-up for Phone Call        spoke with pharmacist needs to be on crestor 10mg  once daily.  The pharmacist will contact the patient. Follow-up by: Bishop Dublin, CMA,  July 08, 2010 2:28 PM    New/Updated Medications: CRESTOR 10 MG TABS (ROSUVASTATIN CALCIUM) one tablet once daily

## 2010-07-18 ENCOUNTER — Encounter: Payer: Self-pay | Admitting: Cardiovascular Disease

## 2010-07-19 LAB — PULMONARY FUNCTION TEST

## 2010-07-26 NOTE — Miscellaneous (Signed)
  Clinical Lists Changes  Medications: Removed medication of ZETIA 10 MG TABS (EZETIMIBE) Take one tablet by mouth daily.

## 2010-09-12 ENCOUNTER — Telehealth: Payer: Self-pay | Admitting: Cardiovascular Disease

## 2010-09-12 NOTE — Telephone Encounter (Signed)
Pt had a breathing test that revealed an enlarged pulmonary artery.  Pt states that Jasmine December was going to call him in regards as to what he needs to do.  Kendell Bane wants to send him to a different cardiologist.

## 2010-09-12 NOTE — Telephone Encounter (Signed)
Notified patient need to contact [facility] that I have already spoken to them and they were to contact him regarding this situation.

## 2010-09-21 ENCOUNTER — Telehealth: Payer: Self-pay | Admitting: Cardiovascular Disease

## 2010-09-21 NOTE — Telephone Encounter (Signed)
Dr. Mariah Milling has review his last test and has stated no cardiac intervention is needed.  We will ask him again about this information.

## 2010-09-21 NOTE — Telephone Encounter (Signed)
Pt would like a call from Jasmine December in regards to what he needs to do about [facility] wanting to refer him elsewhere.

## 2010-09-26 ENCOUNTER — Telehealth: Payer: Self-pay | Admitting: Cardiovascular Disease

## 2010-09-26 NOTE — Telephone Encounter (Signed)
Pt had a near episode of syncope over the weekend.  States that his lung is enlarged.  Having pain across his chest.  Pt's wife states that we need to do paperwork for him.  Please advise.

## 2010-09-27 NOTE — Telephone Encounter (Signed)
Spoke to pt, he does confirm that he nearly passed out over the weekend, and continues to have intermittent chest pain. Scheduled pt for follow up with Dr. Mariah Milling.

## 2010-09-28 ENCOUNTER — Encounter: Payer: Self-pay | Admitting: Cardiovascular Disease

## 2010-10-04 ENCOUNTER — Ambulatory Visit: Payer: Self-pay | Admitting: Cardiovascular Disease
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
## 2010-11-02 ENCOUNTER — Ambulatory Visit: Payer: Self-pay | Admitting: Cardiovascular Disease

## 2010-11-03 ENCOUNTER — Encounter: Payer: Self-pay | Admitting: Cardiovascular Disease

## 2010-11-08 ENCOUNTER — Ambulatory Visit (INDEPENDENT_AMBULATORY_CARE_PROVIDER_SITE_OTHER): Payer: Medicare Other | Admitting: Cardiovascular Disease

## 2010-11-08 ENCOUNTER — Encounter: Payer: Self-pay | Admitting: Cardiovascular Disease

## 2010-11-08 DIAGNOSIS — R079 Chest pain, unspecified: Secondary | ICD-10-CM

## 2010-11-08 DIAGNOSIS — R0989 Other specified symptoms and signs involving the circulatory and respiratory systems: Secondary | ICD-10-CM

## 2010-11-08 DIAGNOSIS — E785 Hyperlipidemia, unspecified: Secondary | ICD-10-CM

## 2010-11-08 DIAGNOSIS — R0602 Shortness of breath: Secondary | ICD-10-CM

## 2010-11-08 DIAGNOSIS — I2510 Atherosclerotic heart disease of native coronary artery without angina pectoris: Secondary | ICD-10-CM

## 2010-11-08 MED ORDER — AMLODIPINE BESY-BENAZEPRIL HCL 5-10 MG PO CAPS: 1.0000 | ORAL_CAPSULE | Freq: Every day | ORAL | Status: DC
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
## 2010-11-08 NOTE — Assessment & Plan Note (Signed)
Currently with no symptoms of angina. No further workup at this time. Continue current medication regimen. He has had chronic episodes of chest pain reproducible with pushing on his mediastinum that is  Noncardiac.

## 2010-11-08 NOTE — Patient Instructions (Signed)
  No medication changes were made. Please call us if you have new issues that need to be addressed before your next appt.  We will call you for a follow up Appt. In 6 months

## 2010-11-08 NOTE — Assessment & Plan Note (Signed)
Would continue aggressive cholesterol management with LDL goal less than 70.

## 2010-11-08 NOTE — Assessment & Plan Note (Addendum)
Recent CT scan of the chest shows hilar and mediastinal lymphadenopathy, diffuse interstitial lung disease. He has had abnormal PFTs. There was some mention her Mr. Goodie about a dilated pulmonary artery though this appears to be reasonable size. He does not have a dilated right ventricle, right atrium. Recent echocardiogram was normal and right ventricular systolic pressures were normal. This suggests no significant pulmonary hypertension. He has no abdominal fullness or lower extremity edema to suggest fluid overload.  I suspect he has underlying lung pathology contributing to his shortness of breath.  I suggested to him that he needs a high-resolution CT scan and further evaluation by pulmonology. Differential can include infiltrative disease and infection.

## 2010-11-08 NOTE — Assessment & Plan Note (Signed)
Bruit heard on auscultation the carotid ultrasound is negative for disease.

## 2010-11-09 ENCOUNTER — Telehealth: Payer: Self-pay | Admitting: *Deleted

## 2010-11-09 NOTE — Telephone Encounter (Signed)
Called pt to let him know Dr. Mariah Milling reviewed his CT scan and confirmed that pulmonary artery requires no intervention. Advised pt to f/u with pulmonologist per Dr. Mariah Milling because images showed some abnormalities with lymph nodes around lungs. Dr. Mariah Milling has discussed with pulmonologist, pt aware and states he already has appt.

## 2010-11-11 ENCOUNTER — Encounter: Payer: Self-pay | Admitting: Cardiovascular Disease

## 2010-11-12 DIAGNOSIS — N486 Induration penis plastica: Secondary | ICD-10-CM | POA: Insufficient documentation

## 2010-11-23 ENCOUNTER — Telehealth: Payer: Self-pay | Admitting: Cardiovascular Disease

## 2010-11-23 NOTE — Telephone Encounter (Signed)
Would like to discuss the surgery that [facility] wants to perform.  Patient's wife states that [facility] tells him that he is going to die if he does not have this surgery and that our office is prolonging it by not performing some tests here.  Patient's wife LMOM.  Please advise.

## 2010-11-23 NOTE — Telephone Encounter (Signed)
Notified patient of what type of surgery he needs to have done at [place] [facility].  He states the doctor at [facility] told him to have an ECHO. He said doctor at [facility] told him if does not have ECHO he is going to die.  Told patient to go ahead with the ECHO & to make sure they send [place] a copy so we will have for his chart. He is not having any type of surgery.

## 2010-11-25 ENCOUNTER — Telehealth: Payer: Self-pay | Admitting: *Deleted

## 2010-11-25 NOTE — Telephone Encounter (Signed)
Pt called this AM stating that he had an echo done at [facility], he will request these results be sent to our office for Dr. Mariah Milling to review.

## 2010-12-24 ENCOUNTER — Observation Stay: Payer: Medicare Other | Admitting: Internal Medicine

## 2010-12-24 DIAGNOSIS — R0602 Shortness of breath: Secondary | ICD-10-CM

## 2010-12-24 DIAGNOSIS — I2510 Atherosclerotic heart disease of native coronary artery without angina pectoris: Secondary | ICD-10-CM

## 2011-02-01 ENCOUNTER — Telehealth: Payer: Self-pay | Admitting: *Deleted

## 2011-02-01 NOTE — Telephone Encounter (Signed)
1330 Pt and his wife both on the phone calling stating that pt was mowing the yard and had chest pain, he took NTG x 2 and then went back out into the yard and he "fell." I advised that pt rest, check BP to make sure not too low and if he is symptomatic does not need to do any activity. Pt on the phone, I told pt if he is having CP or SOB actively and/or is unstable he will need to call 911. Pt hung up the phone.  1445 Pt's wife called again later requesting the doctor's name he saw at [facility], I gave her the name per our records, and she states "pt is stable at this time." Advised she call with any changes.

## 2011-02-06 ENCOUNTER — Telehealth: Payer: Self-pay

## 2011-02-06 ENCOUNTER — Other Ambulatory Visit: Payer: Self-pay | Admitting: *Deleted

## 2011-02-06 MED ORDER — ROSUVASTATIN CALCIUM 10 MG PO TABS: 10.0000 mg | ORAL_TABLET | Freq: Every day | ORAL | Status: DC

## 2011-02-06 NOTE — Telephone Encounter (Signed)
Refill sent for crestor to [facility] pharmacy.

## 2011-05-05 ENCOUNTER — Telehealth: Payer: Self-pay

## 2011-05-05 MED ORDER — CLOPIDOGREL BISULFATE 75 MG PO TABS: 75.0000 mg | ORAL_TABLET | Freq: Every day | ORAL | Status: DC

## 2011-05-05 NOTE — Telephone Encounter (Signed)
Refill sent for plavix  

## 2011-06-05 ENCOUNTER — Telehealth: Payer: Self-pay

## 2011-06-05 MED ORDER — AMLODIPINE BESY-BENAZEPRIL HCL 5-10 MG PO CAPS: 1.0000 | ORAL_CAPSULE | Freq: Every day | ORAL | Status: DC

## 2011-06-05 NOTE — Telephone Encounter (Signed)
Refill sent for amlodipine benazepril 5/10 mg take one tablet daily.

## 2011-06-14 ENCOUNTER — Telehealth: Payer: Self-pay | Admitting: *Deleted

## 2011-06-14 NOTE — Telephone Encounter (Signed)
Pt called and asked to schedule a f/u with Dr. Mariah Milling. Says his pulmonologist recc he see his cardiologist. I scheduled pt for f/u.

## 2011-06-27 ENCOUNTER — Ambulatory Visit (INDEPENDENT_AMBULATORY_CARE_PROVIDER_SITE_OTHER): Payer: Medicare Other | Admitting: Cardiovascular Disease

## 2011-06-27 ENCOUNTER — Encounter: Payer: Self-pay | Admitting: Cardiovascular Disease

## 2011-06-27 DIAGNOSIS — R079 Chest pain, unspecified: Secondary | ICD-10-CM

## 2011-06-27 DIAGNOSIS — R0602 Shortness of breath: Secondary | ICD-10-CM

## 2011-06-27 DIAGNOSIS — I2510 Atherosclerotic heart disease of native coronary artery without angina pectoris: Secondary | ICD-10-CM

## 2011-06-27 DIAGNOSIS — Z01810 Encounter for preprocedural cardiovascular examination: Secondary | ICD-10-CM

## 2011-06-27 DIAGNOSIS — E785 Hyperlipidemia, unspecified: Secondary | ICD-10-CM

## 2011-06-27 NOTE — Assessment & Plan Note (Signed)
Cholesterol is at goal on the current lipid regimen. No changes to the medications were made.  
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
## 2011-06-27 NOTE — Assessment & Plan Note (Signed)
He has had chronic chest pain symptoms. Worsening symptoms recently. Cardiac cath later this week

## 2011-06-27 NOTE — Patient Instructions (Signed)
Please decrease the aspirin to 81 mg x 2 with plavix We will schedule you for a cardiac cath on Friday 06/30/2011 at 10:30 AM in [place] at the Vascular center at [facility]  Please call us if you have new issues that need to be addressed before your next appt.  Your physician wants you to follow-up in: 6 months.  You will receive a reminder letter in the mail two months in advance. If you don't receive a letter, please call our office to schedule the follow-up appointment.

## 2011-06-27 NOTE — Assessment & Plan Note (Signed)
CT scan previously suggesting infiltrative lung disease and lymphadenopathy. We will complete a left and right heart catheterization this week to clear him from a cardiac perspective.

## 2011-06-27 NOTE — Assessment & Plan Note (Signed)
He reports worsening chest pain and shortness of breath. He would like to have his catheterization done through our office at a sooner date. He was scheduled for [facility] at the end of February. We have suggested he could be done at the end of this week and we would send the results to [facility]. We will do a left and right heart catheterization to rule out pulmonary hypertension as a cause of his shortness of breath.

## 2011-06-28 LAB — CBC WITH DIFFERENTIAL
Basos: 0 % (ref 0–3)
Eosinophils Absolute: 0.2 10*3/uL (ref 0.0–0.4)
HCT: 42.7 % (ref 37.5–51.0)
Hemoglobin: 14.8 g/dL (ref 12.6–17.7)
Lymphocytes Absolute: 1.8 10*3/uL (ref 0.7–4.5)
MCH: 28.7 pg (ref 26.6–33.0)
MCHC: 34.7 g/dL (ref 31.5–35.7)
MCV: 83 fL (ref 79–97)
Monocytes Absolute: 0.8 10*3/uL (ref 0.1–1.0)
Neutrophils Absolute: 5.2 10*3/uL (ref 1.8–7.8)
RDW: 12.9 % (ref 12.3–15.4)

## 2011-06-28 LAB — PROTIME-INR: INR: 1 (ref 0.8–1.2)

## 2011-06-28 LAB — BASIC METABOLIC PANEL
BUN/Creatinine Ratio: 12 (ref 10–22)
GFR calc Af Amer: 59 mL/min/{1.73_m2} — ABNORMAL LOW (ref >59)
GFR calc non Af Amer: 51 mL/min/{1.73_m2} — ABNORMAL LOW (ref >59)
Glucose: 124 mg/dL — ABNORMAL HIGH (ref 65–99)
Potassium: 4.1 mmol/L (ref 3.5–5.2)
Sodium: 143 mmol/L (ref 134–144)

## 2011-06-29 ENCOUNTER — Other Ambulatory Visit: Payer: Self-pay | Admitting: Cardiovascular Disease
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
## 2011-06-30 ENCOUNTER — Encounter (HOSPITAL_BASED_OUTPATIENT_CLINIC_OR_DEPARTMENT_OTHER)
Admission: RE | Disposition: A | Discharge status: Home / Self Care | Payer: Self-pay | Source: Ambulatory Visit | Attending: Cardiovascular Disease

## 2011-06-30 ENCOUNTER — Inpatient Hospital Stay (HOSPITAL_BASED_OUTPATIENT_CLINIC_OR_DEPARTMENT_OTHER)
Admission: RE | Admit: 2011-06-30 | Discharge: 2011-06-30 | Disposition: A | Discharge status: Home / Self Care | Payer: Medicare Other | Source: Ambulatory Visit | Attending: Cardiovascular Disease | Admitting: Cardiovascular Disease

## 2011-06-30 DIAGNOSIS — R599 Enlarged lymph nodes, unspecified: Secondary | ICD-10-CM | POA: Insufficient documentation

## 2011-06-30 DIAGNOSIS — Y831 Surgical operation with implant of artificial internal device as the cause of abnormal reaction of the patient, or of later complication, without mention of misadventure at the time of the procedure: Secondary | ICD-10-CM | POA: Insufficient documentation

## 2011-06-30 DIAGNOSIS — Z951 Presence of aortocoronary bypass graft: Secondary | ICD-10-CM | POA: Insufficient documentation

## 2011-06-30 DIAGNOSIS — R0789 Other chest pain: Secondary | ICD-10-CM | POA: Insufficient documentation

## 2011-06-30 DIAGNOSIS — I2510 Atherosclerotic heart disease of native coronary artery without angina pectoris: Secondary | ICD-10-CM | POA: Insufficient documentation

## 2011-06-30 DIAGNOSIS — T82897A Other specified complication of cardiac prosthetic devices, implants and grafts, initial encounter: Secondary | ICD-10-CM | POA: Insufficient documentation

## 2011-06-30 DIAGNOSIS — R0602 Shortness of breath: Secondary | ICD-10-CM | POA: Insufficient documentation

## 2011-06-30 LAB — POCT I-STAT 3, VENOUS BLOOD GAS (G3P V)
Acid-base deficit: 2 mmol/L (ref 0.0–2.0)
Bicarbonate: 23.2 meq/L (ref 20.0–24.0)
O2 Saturation: 62 %
TCO2: 24 mmol/L (ref 0–100)
pCO2, Ven: 40 mmHg — ABNORMAL LOW (ref 45.0–50.0)
pH, Ven: 7.371 — ABNORMAL HIGH (ref 7.250–7.300)
pO2, Ven: 33 mmHg (ref 30.0–45.0)

## 2011-06-30 LAB — POCT I-STAT 3, ART BLOOD GAS (G3+)
Acid-base deficit: 1 mmol/L (ref 0.0–2.0)
Bicarbonate: 21.9 meq/L (ref 20.0–24.0)
O2 Saturation: 99 %
TCO2: 23 mmol/L (ref 0–100)
pCO2 arterial: 30.1 mmHg — ABNORMAL LOW (ref 35.0–45.0)
pH, Arterial: 7.469 — ABNORMAL HIGH (ref 7.350–7.450)
pO2, Arterial: 119 mmHg — ABNORMAL HIGH (ref 80.0–100.0)

## 2011-06-30 SURGERY — JV LEFT AND RIGHT HEART CATHETERIZATION WITH CORONARY/GRAFT ANGIOGRAM
Anesthesia: Moderate Sedation | Laterality: Left

## 2011-06-30 MED ORDER — ONDANSETRON HCL 4 MG/2ML IJ SOLN: 4.0000 mg | Freq: Four times a day (QID) | INTRAMUSCULAR | Status: DC | PRN

## 2011-06-30 MED ORDER — ASPIRIN 81 MG PO CHEW: 324.0000 mg | CHEWABLE_TABLET | ORAL | Status: DC

## 2011-06-30 MED ORDER — SODIUM CHLORIDE 0.9 % IV SOLN: 250.0000 mL | INTRAVENOUS | Status: DC | PRN

## 2011-06-30 MED ORDER — ACETAMINOPHEN 325 MG PO TABS: 650.0000 mg | ORAL_TABLET | ORAL | Status: DC | PRN

## 2011-06-30 NOTE — OR Nursing (Signed)
Discharge instructions reviewed and signed, pt stated instructions,ambulated in hall without difficulty, site level 0, transported to wife's car via wheelchair

## 2011-06-30 NOTE — Op Note (Signed)
Cardiac Catheterization Procedure Note  Name: Shane Reid. MRN: [id number] DOB: 01/02/1946  Procedure: Left Heart Cath, Selective Coronary Angiography, LV angiography  Indication:    Procedural details: The right groin was prepped, draped, and anesthetized with 1% lidocaine. Using modified Seldinger technique, a 5 French sheath was introduced into the right femoral artery. Standard Judkins catheters were used for coronary angiography and left ventriculography. Catheter exchanges were performed over a guidewire. There were no immediate procedural complications. The patient was transferred to the post catheterization recovery area for further monitoring.  Procedural Findings:  Hemodynamics:     LV 121/12/24    Ao 132/73/98    RA 11/9/7    RV 43/4/12    PCW 15/15/12    PA 31/13/19    Ao saturation 99%, hemoglobin 14.8    PA saturation 62%    Resistances: Oxygen consumption 242.5, capacity 201.3    Cardiac output 3.3 L per minute,    cardiac index 1.7 L per minute/meter squared       Coronary angiography:  Coronary dominance: Right  Left mainstem:   Large vessel that bifurcates to the LAD and left circumflex. No significant disease noted.  Left anterior descending (LAD):   Moderate to large size vessel that extends to the apex. Proximal LAD stent is patent with mild in-stent restenosis at its proximal region, estimated at 30%. Moderate size diagonal branch with mild luminal irregularities.  Left circumflex (LCx):  Moderate size vessel with stent in the mid region that is patent with no significant in-stent restenosis. Moderate sized OM that takes off proximally with mild luminal irregularities.  Right coronary artery (RCA):  Dominant vessel that is large in size with distal PDA and PL. No significant disease noted.  Left ventriculography: Left ventricular systolic function is normal, LVEF is estimated at 55-65%, there is no significant mitral regurgitation,no significant  aortic valve stenosis.     Final Conclusions:  Right dominant coronary arterial system with mild in-stent restenosis of the proximal LAD stent, otherwise no significant disease. Etiology of his chest pain is likely noncardiac. Right heart catheterization shows normal pressures. Etiology of his shortness of breath he is not secondary to pulmonary hypertension.   Recommendations:  I suggested he stay on his current medication regimen. Routine groin check in one to 2 weeks   Julien Nordmann 06/30/2011, 2:11 PM

## 2011-07-07 ENCOUNTER — Telehealth: Payer: Self-pay | Admitting: Cardiovascular Disease

## 2011-07-07 NOTE — Telephone Encounter (Signed)
Error

## 2011-07-10 ENCOUNTER — Other Ambulatory Visit: Payer: Self-pay

## 2011-07-10 DIAGNOSIS — Z9889 Other specified postprocedural states: Secondary | ICD-10-CM

## 2011-07-10 DIAGNOSIS — R1031 Right lower quadrant pain: Secondary | ICD-10-CM

## 2011-07-11 ENCOUNTER — Encounter (INDEPENDENT_AMBULATORY_CARE_PROVIDER_SITE_OTHER): Payer: Medicare Other | Admitting: *Deleted

## 2011-07-11 DIAGNOSIS — M79609 Pain in unspecified limb: Secondary | ICD-10-CM

## 2011-07-11 DIAGNOSIS — R1031 Right lower quadrant pain: Secondary | ICD-10-CM

## 2011-07-11 DIAGNOSIS — Z9889 Other specified postprocedural states: Secondary | ICD-10-CM

## 2011-07-13 ENCOUNTER — Telehealth: Payer: Self-pay

## 2011-07-13 NOTE — Telephone Encounter (Signed)
Notified [facility] the amlodipine benaz hctz is approved.

## 2011-07-19 ENCOUNTER — Encounter: Payer: Medicare Other | Admitting: Cardiovascular Disease

## 2011-11-22 ENCOUNTER — Other Ambulatory Visit: Payer: Self-pay | Admitting: Cardiovascular Disease

## 2011-11-22 MED ORDER — CLOPIDOGREL BISULFATE 75 MG PO TABS: 75.0000 mg | ORAL_TABLET | Freq: Every day | ORAL | Status: DC

## 2011-11-22 NOTE — Telephone Encounter (Signed)
Refilled Clopidogrel. 

## 2012-01-23 ENCOUNTER — Other Ambulatory Visit: Payer: Self-pay

## 2012-01-23 MED ORDER — ROSUVASTATIN CALCIUM 10 MG PO TABS: 10.0000 mg | ORAL_TABLET | Freq: Every day | ORAL | Status: DC

## 2012-01-23 NOTE — Telephone Encounter (Signed)
Refill sent for crestor 10 mg  

## 2012-02-07 ENCOUNTER — Ambulatory Visit: Payer: Medicare Other | Admitting: Cardiovascular Disease

## 2012-02-26 ENCOUNTER — Other Ambulatory Visit: Payer: Self-pay

## 2012-02-26 MED ORDER — AMLODIPINE BESY-BENAZEPRIL HCL 5-10 MG PO CAPS: 1.0000 | ORAL_CAPSULE | Freq: Every day | ORAL | Status: DC

## 2012-02-26 NOTE — Telephone Encounter (Signed)
Refill sent for amlodipine/benazaepril 5/10 mg

## 2012-02-26 NOTE — Telephone Encounter (Signed)
Refill sent for amlodipine benazepril 5/10 mg take one tablet daily. 

## 2012-04-29 ENCOUNTER — Other Ambulatory Visit: Payer: Self-pay | Admitting: *Deleted

## 2012-04-29 MED ORDER — CLOPIDOGREL BISULFATE 75 MG PO TABS: 75.0000 mg | ORAL_TABLET | Freq: Every day | ORAL | Status: DC

## 2012-04-29 NOTE — Telephone Encounter (Signed)
LMTCB pt is overdue for 6 month f/u needs to reschedule a future appointment.Refilled Clopidogrel.

## 2012-05-02 ENCOUNTER — Ambulatory Visit (INDEPENDENT_AMBULATORY_CARE_PROVIDER_SITE_OTHER): Payer: Medicare Other | Admitting: Cardiovascular Disease

## 2012-05-02 ENCOUNTER — Encounter: Payer: Self-pay | Admitting: Cardiovascular Disease

## 2012-05-02 VITALS — BP 140/80 | HR 65 | Ht 69.0 in | Wt 167.0 lb

## 2012-05-02 DIAGNOSIS — M79609 Pain in unspecified limb: Secondary | ICD-10-CM

## 2012-05-02 DIAGNOSIS — R0789 Other chest pain: Secondary | ICD-10-CM

## 2012-05-02 DIAGNOSIS — I2510 Atherosclerotic heart disease of native coronary artery without angina pectoris: Secondary | ICD-10-CM

## 2012-05-02 DIAGNOSIS — E785 Hyperlipidemia, unspecified: Secondary | ICD-10-CM

## 2012-05-02 DIAGNOSIS — M79602 Pain in left arm: Secondary | ICD-10-CM

## 2012-05-02 DIAGNOSIS — R079 Chest pain, unspecified: Secondary | ICD-10-CM

## 2012-05-02 DIAGNOSIS — R0602 Shortness of breath: Secondary | ICD-10-CM

## 2012-05-02 MED ORDER — ROSUVASTATIN CALCIUM 10 MG PO TABS: 10.0000 mg | ORAL_TABLET | Freq: Every day | ORAL | Status: DC

## 2012-05-02 MED ORDER — ASPIRIN EC 325 MG PO TBEC: 325.0000 mg | DELAYED_RELEASE_TABLET | Freq: Every day | ORAL | Status: DC

## 2012-05-02 MED ORDER — ALBUTEROL SULFATE (2.5 MG/3ML) 0.083% IN NEBU: 2.5000 mg | INHALATION_SOLUTION | Freq: Four times a day (QID) | RESPIRATORY_TRACT | Status: DC | PRN

## 2012-05-02 MED ORDER — NITROGLYCERIN 0.4 MG SL SUBL: 0.4000 mg | SUBLINGUAL_TABLET | SUBLINGUAL | Status: DC | PRN

## 2012-05-02 MED ORDER — CLOPIDOGREL BISULFATE 75 MG PO TABS: 75.0000 mg | ORAL_TABLET | Freq: Every day | ORAL | Status: DC

## 2012-05-02 MED ORDER — AMLODIPINE BESY-BENAZEPRIL HCL 5-10 MG PO CAPS: 1.0000 | ORAL_CAPSULE | Freq: Every day | ORAL | Status: DC

## 2012-05-02 NOTE — Assessment & Plan Note (Signed)
Cholesterol is at goal on the current lipid regimen. No changes to the medications were made.  

## 2012-05-02 NOTE — Assessment & Plan Note (Signed)
Atypical chest pain. Also with neck, shoulder miscellaneous pains likely musculoskeletal.
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
## 2012-05-02 NOTE — Patient Instructions (Addendum)
You are doing well. No medication changes were made.  Please call us if you have new issues that need to be addressed before your next appt.  Your physician wants you to follow-up in: 6 months.  You will receive a reminder letter in the mail two months in advance. If you don't receive a letter, please call our office to schedule the follow-up appointment.   

## 2012-05-02 NOTE — Assessment & Plan Note (Signed)
Atypical chest pain, chronic issue. Prior cardiac catheterization earlier this year showing no progression of his coronary disease. No further workup needed.

## 2012-11-13 ENCOUNTER — Encounter: Payer: Self-pay | Admitting: Cardiovascular Disease

## 2012-11-13 ENCOUNTER — Ambulatory Visit (INDEPENDENT_AMBULATORY_CARE_PROVIDER_SITE_OTHER): Payer: Medicare Other | Admitting: Cardiovascular Disease

## 2012-11-13 VITALS — BP 120/70 | HR 59 | Ht 69.0 in | Wt 178.8 lb

## 2012-11-13 DIAGNOSIS — R079 Chest pain, unspecified: Secondary | ICD-10-CM

## 2012-11-13 DIAGNOSIS — I2510 Atherosclerotic heart disease of native coronary artery without angina pectoris: Secondary | ICD-10-CM

## 2012-11-13 DIAGNOSIS — E785 Hyperlipidemia, unspecified: Secondary | ICD-10-CM

## 2012-11-13 DIAGNOSIS — R0602 Shortness of breath: Secondary | ICD-10-CM

## 2012-11-13 NOTE — Assessment & Plan Note (Signed)
Does not report having any chest pain on today's visit
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
## 2012-11-13 NOTE — Patient Instructions (Addendum)
You are doing well. No medication changes were made.  We will check lipids today  Please call us if you have new issues that need to be addressed before your next appt.  Your physician wants you to follow-up in: 6 months.  You will receive a reminder letter in the mail two months in advance. If you don't receive a letter, please call our office to schedule the follow-up appointment.   

## 2012-11-13 NOTE — Assessment & Plan Note (Addendum)
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 He reports inhalers do not help. He has not had hypoxemia in the past. Would not qualify for oxygen based on previous numbers.

## 2012-11-13 NOTE — Assessment & Plan Note (Signed)
Encouraged him to stay on his Crestor. We will check his lipids today.

## 2012-11-13 NOTE — Assessment & Plan Note (Signed)
Currently with no symptoms of angina. No further workup at this time. Continue current medication regimen. 

## 2012-12-10 ENCOUNTER — Other Ambulatory Visit: Payer: Self-pay

## 2012-12-16 ENCOUNTER — Other Ambulatory Visit: Payer: Self-pay | Admitting: Cardiovascular Disease

## 2012-12-16 MED ORDER — CHOLINE FENOFIBRATE 135 MG PO CPDR: 135.0000 mg | DELAYED_RELEASE_CAPSULE | Freq: Every day | ORAL | Status: DC

## 2012-12-17 ENCOUNTER — Other Ambulatory Visit: Payer: Self-pay | Admitting: *Deleted

## 2012-12-17 MED ORDER — CHOLINE FENOFIBRATE 135 MG PO CPDR: 135.0000 mg | DELAYED_RELEASE_CAPSULE | Freq: Every day | ORAL | Status: DC

## 2013-03-31 ENCOUNTER — Emergency Department: Payer: Self-pay | Admitting: Emergency Medicine

## 2013-03-31 LAB — BASIC METABOLIC PANEL
Anion Gap: 3 — ABNORMAL LOW (ref 7–16)
BUN: 16 mg/dL (ref 7–18)
Chloride: 111 mmol/L — ABNORMAL HIGH (ref 98–107)
Co2: 27 mmol/L (ref 21–32)
Creatinine: 1.52 mg/dL — ABNORMAL HIGH (ref 0.60–1.30)
EGFR (African American): 54 — ABNORMAL LOW
EGFR (Non-African Amer.): 47 — ABNORMAL LOW
Sodium: 141 mmol/L (ref 136–145)

## 2013-03-31 LAB — CK TOTAL AND CKMB (NOT AT ARMC)
CK, Total: 260 U/L — ABNORMAL HIGH (ref 35–232)
CK-MB: 3.1 ng/mL (ref 0.5–3.6)

## 2013-03-31 LAB — CBC
MCHC: 34.4 g/dL (ref 32.0–36.0)
RBC: 4.53 10*6/uL (ref 4.40–5.90)
RDW: 13.4 % (ref 11.5–14.5)

## 2013-03-31 LAB — PROTIME-INR: INR: 1.1

## 2013-05-12 ENCOUNTER — Ambulatory Visit (INDEPENDENT_AMBULATORY_CARE_PROVIDER_SITE_OTHER): Payer: Medicare Other | Admitting: Cardiovascular Disease

## 2013-05-12 ENCOUNTER — Encounter: Payer: Self-pay | Admitting: Cardiovascular Disease

## 2013-05-12 VITALS — BP 120/72 | HR 59 | Ht 69.0 in | Wt 173.0 lb

## 2013-05-12 DIAGNOSIS — I2510 Atherosclerotic heart disease of native coronary artery without angina pectoris: Secondary | ICD-10-CM

## 2013-05-12 DIAGNOSIS — E785 Hyperlipidemia, unspecified: Secondary | ICD-10-CM

## 2013-05-12 DIAGNOSIS — R0602 Shortness of breath: Secondary | ICD-10-CM

## 2013-05-12 DIAGNOSIS — R079 Chest pain, unspecified: Secondary | ICD-10-CM

## 2013-05-12 MED ORDER — NAPROXEN 500 MG PO TBEC: 500.0000 mg | DELAYED_RELEASE_TABLET | Freq: Two times a day (BID) | ORAL | Status: DC | PRN

## 2013-05-12 NOTE — Patient Instructions (Addendum)
You are doing well. No medication changes were made.  When you have chest pain, take a naproxen as needed (you can take up to two at a time)  Please call us if you have new issues that need to be addressed before your next appt.  Your physician wants you to follow-up in: 6 months.  You will receive a reminder letter in the mail two months in advance. If you don't receive a letter, please call our office to schedule the follow-up appointment.

## 2013-05-12 NOTE — Assessment & Plan Note (Signed)
Currently with no symptoms of angina. No further workup at this time. Continue current medication regimen. Chest pain is atypical in nature. We have suggested he try naproxen

## 2013-05-12 NOTE — Progress Notes (Signed)
Patient ID: Shane Shirts., male    DOB: 1945-11-23, 67 y.o.   MRN: [id number]  HPI Comments: Shane Reid is a 67 year old gentleman with a history of coronary artery disease, PCI with LAD PCI in 2008, history of chronic chest pain which is musculoskeletal, history of traumatic injury to the chest, history of esophageal strictures, status post esophageal stretching, h/o left neck pain radiating to the arm Felt to be musculoskeletal, and Chronic shortness of breath With chest CT scan showing infiltrative disease and lymphadenopathy, presents for routine followup. 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   Recent evaluation in the emergency room 03/31/2013 for chest pain. He has periodic episodes of chest pain lasting for up to 2 or 3 days a time. In the center of his chest, reproducible with palpation. He did not stay for admission to the hospital, cardiac workup was essentially negative. In the past he has not wanted to take a pain medication or anti-inflammatory. Wife reports that he throws these medicines away.  cardiac catheterization in 2013 showing patent stent, no severe stenoses that would contribute to his chest pain. Right heart catheterization was done at the same time that showed normal right heart pressures (despite [facility] pulmonary insisting he had dilated pulmonary artery and high pressures).   he has had followup at [facility] pulmonary since then with right heart catheterization again confirming by his report, normal right heart pressures. He reports they do not know why he is short of breath .    He has not had recent followup at [facility] pulmonary. Reports his shortness of breath is chronic, stable. He does have previous exposure to coal dust, kerosene, worked in the [employer] [place] also worked with lumbar.  per his report has failed a 6 minute walk test. On this test, he had significant shortness of breath, lightheadedness, blurry vision.    On a previous trip to the office,He was ambulated  with monitoring and he maintained his oxygen level in the high 90s with exertion, heart rates stayed in the 70s. Blood pressure was stable with systolics in the 160s after exertion. Interestingly, he had significant symptoms of dizziness, severe shortness of breath and had to sit down.     He had a stress test in February of 2011 that was negative. Myoview study. Carotid ultrasound done  for bruits showed no significant atherosclerotic disease. chest x-ray November 12 2009 shows hyperinflation of the lungs consistent with COPD, areas of fibrosis appear present as well echocardiogram done in October 2011 showed normal systolic function, diastolic relaxation abnormality otherwise no significant abnormalities   EKG shows normal sinus rhythm with rate 59 beats per minute, voltage consistent with LVH, nonspecific ST abnormality      Outpatient Encounter Prescriptions as of 05/12/2013  Medication Sig  . albuterol (PROVENTIL) (2.5 MG/3ML) 0.083% nebulizer solution Take 3 mLs (2.5 mg total) by nebulization every 6 (six) hours as needed.  [name] amLODipine-benazepril (LOTREL) 5-10 MG per capsule Take 1 capsule by mouth daily.  [name] aspirin 81 MG tablet Take 81 mg by mouth daily.  . Choline Fenofibrate 135 MG capsule Take 1 capsule (135 mg total) by mouth daily.  . clopidogrel (PLAVIX) 75 MG tablet Take 1 tablet (75 mg total) by mouth daily.  . Multiple Vitamin (MULTIVITAMIN) tablet Take 1 tablet by mouth daily.    . nitroGLYCERIN (NITROSTAT) 0.4 MG SL tablet Place 1 tablet (0.4 mg total) under the tongue every 5 (five) minutes as needed.  . rosuvastatin (CRESTOR) 10 MG tablet  Take 1 tablet (10 mg total) by mouth daily.  . [DISCONTINUED] aspirin EC 325 MG tablet Take 1 tablet (325 mg total) by mouth daily.     Review of Systems  Constitutional: Negative.   HENT: Negative.   Eyes: Negative.   Respiratory: Positive for shortness of breath.   Cardiovascular: Positive for chest pain.  Gastrointestinal:  Negative.   Endocrine: Negative.   Musculoskeletal: Negative.   Skin: Negative.   Allergic/Immunologic: Negative.   Neurological: Negative.   Hematological: Negative.   Psychiatric/Behavioral: Positive for sleep disturbance.  All other systems reviewed and are negative.    BP 120/72  Pulse 59  Ht 5\' 9"  (1.753 m)  Wt 173 lb (78.472 kg)  BMI 25.54 kg/m2  Physical Exam  Nursing note and vitals reviewed. Constitutional: He is oriented to person, place, and time. He appears well-developed and well-nourished.  Reproducible chest pain with palpation.   HENT:  Head: Normocephalic.  Nose: Nose normal.  Mouth/Throat: Oropharynx is clear and moist.  Eyes: Conjunctivae are normal. Pupils are equal, round, and reactive to light.  Neck: Normal range of motion. Neck supple. No JVD present.  Cardiovascular: Normal rate, regular rhythm, S1 normal, S2 normal and intact distal pulses.  Exam reveals no gallop and no friction rub.   Murmur heard.  Crescendo systolic murmur is present with a grade of 1/6  Pulmonary/Chest: Effort normal and breath sounds normal. No respiratory distress. He has no wheezes. He has no rales. He exhibits no tenderness.  Abdominal: Soft. Bowel sounds are normal. He exhibits no distension. There is no tenderness.  Musculoskeletal: Normal range of motion. He exhibits no edema and no tenderness.  Lymphadenopathy:    He has no cervical adenopathy.  Neurological: He is alert and oriented to person, place, and time. Coordination normal.  Skin: Skin is warm and dry. No rash noted. No erythema.  Psychiatric: He has a normal mood and affect. His behavior is normal. Judgment and thought content normal.      Assessment and Plan

## 2013-05-12 NOTE — Assessment & Plan Note (Signed)
Prior trauma to his chest. No further workup at this time. Atypical chest pain lasting for days at a time. Suggested he take NSAIDs

## 2013-05-12 NOTE — Assessment & Plan Note (Addendum)
Suggest he continue on his Crestor. Goal LDL less than 70

## 2013-05-12 NOTE — Assessment & Plan Note (Signed)
Chronic shortness of breath, stable, likely from occupational exposure

## 2013-05-30 ENCOUNTER — Other Ambulatory Visit: Payer: Self-pay | Admitting: *Deleted

## 2013-05-30 MED ORDER — ROSUVASTATIN CALCIUM 10 MG PO TABS: 10.0000 mg | ORAL_TABLET | Freq: Every day | ORAL | Status: DC

## 2013-05-30 MED ORDER — CLOPIDOGREL BISULFATE 75 MG PO TABS: 75.0000 mg | ORAL_TABLET | Freq: Every day | ORAL | Status: DC

## 2013-05-30 MED ORDER — AMLODIPINE BESY-BENAZEPRIL HCL 5-10 MG PO CAPS: 1.0000 | ORAL_CAPSULE | Freq: Every day | ORAL | Status: DC

## 2013-05-30 NOTE — Telephone Encounter (Signed)
Requested Prescriptions   Signed Prescriptions Disp Refills  . amLODipine-benazepril (LOTREL) 5-10 MG per capsule 30 capsule 12    Sig: Take 1 capsule by mouth daily.    Authorizing Provider: Antonieta Iba    Ordering User: Iverson Alamin C  . clopidogrel (PLAVIX) 75 MG tablet 30 tablet 12    Sig: Take 1 tablet (75 mg total) by mouth daily.    Authorizing Provider: Antonieta Iba    Ordering User: Shawnie Dapper, MARINA C  . rosuvastatin (CRESTOR) 10 MG tablet 30 tablet 12    Sig: Take 1 tablet (10 mg total) by mouth daily.    Authorizing Provider: Antonieta Iba    Ordering User: Kendrick Fries

## 2013-06-26 ENCOUNTER — Other Ambulatory Visit: Payer: Self-pay

## 2013-06-26 MED ORDER — CLOPIDOGREL BISULFATE 75 MG PO TABS: 75.0000 mg | ORAL_TABLET | Freq: Every day | ORAL | Status: DC

## 2013-06-26 MED ORDER — CHOLINE FENOFIBRATE 135 MG PO CPDR: 135.0000 mg | DELAYED_RELEASE_CAPSULE | Freq: Every day | ORAL | Status: DC

## 2013-06-26 MED ORDER — AMLODIPINE BESY-BENAZEPRIL HCL 5-10 MG PO CAPS: 1.0000 | ORAL_CAPSULE | Freq: Every day | ORAL | Status: DC

## 2013-06-26 MED ORDER — ROSUVASTATIN CALCIUM 10 MG PO TABS: 10.0000 mg | ORAL_TABLET | Freq: Every day | ORAL | Status: DC

## 2013-06-26 NOTE — Telephone Encounter (Signed)
Requested Prescriptions   Signed Prescriptions Disp Refills  . amLODipine-benazepril (LOTREL) 5-10 MG per capsule 30 capsule 3    Sig: Take 1 capsule by mouth daily.    Authorizing Provider: Antonieta Iba    Ordering User: Iverson Alamin C  . clopidogrel (PLAVIX) 75 MG tablet 30 tablet 3    Sig: Take 1 tablet (75 mg total) by mouth daily.    Authorizing Provider: Antonieta Iba    Ordering User: Shawnie Dapper, MARINA C  . rosuvastatin (CRESTOR) 10 MG tablet 30 tablet 3    Sig: Take 1 tablet (10 mg total) by mouth daily.    Authorizing Provider: Antonieta Iba    Ordering User: Kendrick Fries Choline Fenofibrate 135 MG capsule 30 capsule 3    Sig: Take 1 capsule (135 mg total) by mouth daily.    Authorizing Provider: Antonieta Iba    Ordering User: Kendrick Fries

## 2013-08-05 ENCOUNTER — Telehealth: Payer: Self-pay

## 2013-08-05 MED ORDER — AMLODIPINE BESY-BENAZEPRIL HCL 5-10 MG PO CAPS: 1.0000 | ORAL_CAPSULE | Freq: Every day | ORAL | Status: DC

## 2013-08-05 NOTE — Telephone Encounter (Signed)
[employer] has approved the Amlodipine Besylate/Benazepril HCTZ 5/10 mg capsule; approved through 05/28/2014.

## 2013-08-06 ENCOUNTER — Emergency Department: Payer: Self-pay

## 2013-08-06 LAB — BASIC METABOLIC PANEL
Anion Gap: 4 — ABNORMAL LOW (ref 7–16)
BUN: 15 mg/dL (ref 7–18)
CALCIUM: 8.7 mg/dL (ref 8.5–10.1)
CO2: 24 mmol/L (ref 21–32)
Chloride: 110 mmol/L — ABNORMAL HIGH (ref 98–107)
Creatinine: 1.38 mg/dL — ABNORMAL HIGH (ref 0.60–1.30)
EGFR (African American): >60
EGFR (Non-African Amer.): 52 — ABNORMAL LOW
GLUCOSE: 92 mg/dL (ref 65–99)
Osmolality: 276 (ref 275–301)
POTASSIUM: 3.9 mmol/L (ref 3.5–5.1)
SODIUM: 138 mmol/L (ref 136–145)

## 2013-08-06 LAB — CBC
HCT: 40.3 % (ref 40.0–52.0)
HGB: 13.8 g/dL (ref 13.0–18.0)
MCH: 29.4 pg (ref 26.0–34.0)
MCHC: 34.3 g/dL (ref 32.0–36.0)
MCV: 86 fL (ref 80–100)
PLATELETS: 221 10*3/uL (ref 150–440)
RBC: 4.7 10*6/uL (ref 4.40–5.90)
RDW: 13.3 % (ref 11.5–14.5)
WBC: 9.5 10*3/uL (ref 3.8–10.6)

## 2013-08-06 LAB — TROPONIN I

## 2013-08-20 ENCOUNTER — Telehealth: Payer: Self-pay

## 2013-08-20 NOTE — Telephone Encounter (Signed)
Pt's wife called stating that pt was "in [place] about his private parts", had chest pain and was told to go immediately to [facility] ED. Pt went as instructed, but reports that the wait was too long and he left. Wife states that pt has been experiencing chest pain, his left arm has been numb and he "doesn't look good". Advised her to hang up the phone and call 911, but she states that "he's been laying there like that for the past 5 days" and "he'll kill me if I take him back to that dog doctor". Repeated the need for pt to seek immediate medical attention, but wife used profane language to describe how pt would react if she did so. She states that pt wants to see Dr. Mariah Milling today or tomorrow.  Advised her that we have no openings in the office and that pt does not need to wait. She states that she no longer drives and pt would drive himself.   Strongly advised against this.   Advised her to call 911 and have EMS perform an EKG and if sx are indeed heart related, have the ambulance take them to [facility]. She is not agreeable to this, as she states that EMS all work for [facility] and they will take him there. She refuses to seek attention, as she repeatedly states that "he'll beat my ass if he knows I'm calling". Again repeated the need to seek attention, but she refuses.   Spoke w/ Dr. Mariah Milling who recommends that pt take a nitro, as he has a long h/o chest pain that is not cardiac related, and speak w/ his PCP.  Called and spoke w/ pt.  Advised him of Dr. Windell Hummingbird recommendations.  He states that he felt a "pop" in his neck and now his left arm is numb. Advised him that this may be nerve related and that he should see his PCP. Pt states "no, no, no.  I know it's my heart cause my whole arm and fingers are numb.  I know what I gotta do.  I'll do it, I'll do it." Attempted to discuss w/ pt, but he raised his voice and told me I was wrong.  Pt is quite adamant that he is having a heart attack and will  call 911.

## 2013-10-23 ENCOUNTER — Emergency Department: Payer: Self-pay | Admitting: Emergency Medicine

## 2013-10-23 LAB — URINALYSIS, COMPLETE
Bacteria: NONE SEEN
Bilirubin,UR: NEGATIVE
Glucose,UR: NEGATIVE mg/dL (ref 0–75)
KETONE: NEGATIVE
Leukocyte Esterase: NEGATIVE
Nitrite: NEGATIVE
PROTEIN: NEGATIVE
Ph: 6 (ref 4.5–8.0)
RBC,UR: 4 /HPF (ref 0–5)
Specific Gravity: 1.02 (ref 1.003–1.030)
Squamous Epithelial: NONE SEEN
WBC UR: 4 /HPF (ref 0–5)

## 2013-10-23 LAB — COMPREHENSIVE METABOLIC PANEL
ALBUMIN: 3.6 g/dL (ref 3.4–5.0)
ANION GAP: 8 (ref 7–16)
AST: 24 U/L (ref 15–37)
Alkaline Phosphatase: 55 U/L
BILIRUBIN TOTAL: 0.5 mg/dL (ref 0.2–1.0)
BUN: 14 mg/dL (ref 7–18)
CALCIUM: 8.9 mg/dL (ref 8.5–10.1)
Chloride: 105 mmol/L (ref 98–107)
Co2: 25 mmol/L (ref 21–32)
Creatinine: 1.42 mg/dL — ABNORMAL HIGH (ref 0.60–1.30)
GFR CALC AF AMER: 58 — AB
GFR CALC NON AF AMER: 50 — AB
Glucose: 208 mg/dL — ABNORMAL HIGH (ref 65–99)
OSMOLALITY: 282 (ref 275–301)
Potassium: 4.2 mmol/L (ref 3.5–5.1)
SGPT (ALT): 32 U/L (ref 12–78)
SODIUM: 138 mmol/L (ref 136–145)
Total Protein: 7.3 g/dL (ref 6.4–8.2)

## 2013-10-23 LAB — CBC WITH DIFFERENTIAL/PLATELET
BASOS PCT: 0.4 %
Basophil #: 0 10*3/uL (ref 0.0–0.1)
Eosinophil #: 0.1 10*3/uL (ref 0.0–0.7)
Eosinophil %: 0.8 %
HCT: 41.2 % (ref 40.0–52.0)
HGB: 13.3 g/dL (ref 13.0–18.0)
Lymphocyte #: 1.7 10*3/uL (ref 1.0–3.6)
Lymphocyte %: 13.5 %
MCH: 28.1 pg (ref 26.0–34.0)
MCHC: 32.2 g/dL (ref 32.0–36.0)
MCV: 87 fL (ref 80–100)
Monocyte #: 0.9 x10 3/mm (ref 0.2–1.0)
Monocyte %: 7.2 %
Neutrophil #: 10.1 10*3/uL — ABNORMAL HIGH (ref 1.4–6.5)
Neutrophil %: 78.1 %
Platelet: 229 10*3/uL (ref 150–440)
RBC: 4.73 10*6/uL (ref 4.40–5.90)
RDW: 13.1 % (ref 11.5–14.5)
WBC: 12.9 10*3/uL — AB (ref 3.8–10.6)

## 2013-11-20 ENCOUNTER — Encounter: Payer: Self-pay | Admitting: Cardiovascular Disease

## 2013-11-20 ENCOUNTER — Ambulatory Visit (INDEPENDENT_AMBULATORY_CARE_PROVIDER_SITE_OTHER): Payer: Medicare Other | Admitting: Cardiovascular Disease

## 2013-11-20 VITALS — BP 130/70 | HR 56 | Ht 69.0 in | Wt 171.0 lb

## 2013-11-20 DIAGNOSIS — E785 Hyperlipidemia, unspecified: Secondary | ICD-10-CM

## 2013-11-20 DIAGNOSIS — I209 Angina pectoris, unspecified: Secondary | ICD-10-CM

## 2013-11-20 DIAGNOSIS — I2510 Atherosclerotic heart disease of native coronary artery without angina pectoris: Secondary | ICD-10-CM

## 2013-11-20 DIAGNOSIS — R262 Difficulty in walking, not elsewhere classified: Secondary | ICD-10-CM

## 2013-11-20 DIAGNOSIS — R0602 Shortness of breath: Secondary | ICD-10-CM

## 2013-11-20 DIAGNOSIS — I25118 Atherosclerotic heart disease of native coronary artery with other forms of angina pectoris: Secondary | ICD-10-CM

## 2013-11-20 DIAGNOSIS — R079 Chest pain, unspecified: Secondary | ICD-10-CM

## 2013-11-20 NOTE — Assessment & Plan Note (Signed)
Chronic shortness of breath. Prior exposure to kerosene fumes. Did not complain about shortness of breath on today's visit

## 2013-11-20 NOTE — Assessment & Plan Note (Signed)
Cholesterol is at goal on the current lipid regimen. No changes to the medications were made.  

## 2013-11-20 NOTE — Assessment & Plan Note (Signed)
Chest pain over the past several weeks. He has had numerous recurrences of chest pain over the years he reports this one is different. 3 years since his last stress test. He is unable to treadmill secondary to shortness of breath. Myoview has been ordered. Suspect atypical chest pain

## 2013-11-20 NOTE — Patient Instructions (Addendum)
We will schedule you for a lexiscan myoview Stop the amlodipine the night before and morning of the test  Please call us if you have new issues that need to be addressed before your next appt.  Your physician wants you to follow-up in: 6 months.  You will receive a reminder letter in the mail two months in advance. If you don't receive a letter, please call our office to schedule the follow-up appointment.  [facility] MYOVIEW  Your caregiver has ordered a Stress Test with nuclear imaging. The purpose of this test is to evaluate the blood supply to your heart muscle. This procedure is referred to as a "Non-Invasive Stress Test." This is because other than having an IV started in your vein, nothing is inserted or "invades" your body. Cardiac stress tests are done to find areas of poor blood flow to the heart by determining the extent of coronary artery disease (CAD). Some patients exercise on a treadmill, which naturally increases the blood flow to your heart, while others who are  unable to walk on a treadmill due to physical limitations have a pharmacologic/chemical stress agent called Lexiscan . This medicine will mimic walking on a treadmill by temporarily increasing your coronary blood flow.   Please note: these test may take anywhere between 2-4 hours to complete  PLEASE REPORT TO [facility]  THE VOLUNTEERS AT THE FIRST DESK WILL DIRECT YOU WHERE TO GO  Date of Procedure:_______Thursday, July 2____________  Arrival Time for Procedure:______9:45am______________  Instructions regarding medication:    __X__:  Hold other medications as follows:_____amlodipine-benazepril the night before and morning of your procedure__________________  PLEASE NOTIFY THE OFFICE AT LEAST 24 HOURS IN ADVANCE IF YOU ARE UNABLE TO KEEP YOUR APPOINTMENT.  [phone] AND  PLEASE NOTIFY NUCLEAR MEDICINE AT [facility] AT LEAST 24 HOURS IN ADVANCE IF YOU ARE UNABLE TO KEEP YOUR APPOINTMENT. [phone]  How  to prepare for your Myoview test:  1. Do not eat or drink after midnight 2. No caffeine for 24 hours prior to test 3. No smoking 24 hours prior to test. 4. Your medication may be taken with water.  If your doctor stopped a medication because of this test, do not take that medication. 5. Ladies, please do not wear dresses.  Skirts or pants are appropriate. Please wear a short sleeve shirt. 6. No perfume, cologne or lotion. 7. Wear comfortable walking shoes. No heels!

## 2013-11-20 NOTE — Progress Notes (Signed)
Patient ID: Shane Shirtsaymond S Elizondo Jr., male    DOB: 1945/12/13, 68 y.o.   MRN: [id number]  HPI Comments: Shane Reid is a 68 year old gentleman with a history of coronary artery disease, PCI with LAD PCI in 2008, history of chronic chest pain which is musculoskeletal, history of traumatic injury to the chest, history of esophageal strictures, status post esophageal stretching, h/o left neck pain radiating to the arm Felt to be musculoskeletal, and Chronic shortness of breath With chest CT scan showing infiltrative disease and lymphadenopathy, presents for routine followup. 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   In followup today, he reports that he has chest pain. He has had this for the past month, it has been chronic, at rest and with exertion. Some discomfort with palpation. He reports that it feels different from prior episodes of chest pain. Less reproducibility with palpation. He is sleeping in the heat at home, no air conditioning. He reports he has not seen pulmonary in one year. By his report, they did not know what was wrong with him, did not have a definitive diagnosis for his shortness of breath Reports having recent testicular pain may 2015. Ultrasound did not show torsion of the testicle Workup for chest pain 08/06/2013. Workup was negative  Previous trips to the emergency room 03/31/2013 for chest pain. He has periodic episodes of chest pain lasting for up to 2 or 3 days a time. In the center of his chest, reproducible with palpation. He did not stay for admission to the hospital, cardiac workup was essentially negative. In the past he has not wanted to take a pain medication or anti-inflammatory. Wife reports that he throws these medicines away.  cardiac catheterization in 2013 showing patent stent, no severe stenoses that would contribute to his chest pain. Right heart catheterization was done at the same time that showed normal right heart pressures (despite [facility] insisting he had  dilated pulmonary artery and high pressures).   [facility] since then with right heart catheterization again confirming by his report, normal right heart pressures. He reports they do not know why he is short of breath .     On a previous trip to the office,He was ambulated with monitoring and he maintained his oxygen level in the high 90s with exertion, heart rates stayed in the 70s. Blood pressure was stable with systolics in the 160s after exertion. Interestingly, he had significant symptoms of dizziness, severe shortness of breath and had to sit down.     He had a stress test in February of 2011 that was negative. Myoview study. Carotid ultrasound done  for bruits showed no significant atherosclerotic disease. chest x-ray November 12 2009 shows hyperinflation of the lungs consistent with COPD, areas of fibrosis appear present as well echocardiogram done in October 2011 showed normal systolic function, diastolic relaxation abnormality otherwise no significant abnormalities   EKG shows normal sinus rhythm with rate 56 beats per minute, voltage consistent with LVH, nonspecific ST abnormality      Outpatient Encounter Prescriptions as of 11/20/2013  Medication Sig  . albuterol (PROVENTIL) (2.5 MG/3ML) 0.083% nebulizer solution Take 3 mLs (2.5 mg total) by nebulization every 6 (six) hours as needed.  [name]. amLODipine-benazepril (LOTREL) 5-10 MG per capsule Take 1 capsule by mouth daily.  [name]. aspirin 81 MG tablet Take 81 mg by mouth daily.  . Choline Fenofibrate 135 MG capsule Take 1 capsule (135 mg total) by mouth daily.  . clopidogrel (PLAVIX) 75 MG  tablet Take 1 tablet (75 mg total) by mouth daily.  . Multiple Vitamin (MULTIVITAMIN) tablet Take 1 tablet by mouth daily.    . naproxen (EC NAPROSYN) 500 MG EC tablet Take 1 tablet (500 mg total) by mouth 2 (two) times daily as needed.  . nitroGLYCERIN (NITROSTAT) 0.4 MG SL tablet Place 1 tablet (0.4 mg total) under the tongue  every 5 (five) minutes as needed.  . rosuvastatin (CRESTOR) 10 MG tablet Take 1 tablet (10 mg total) by mouth daily.     Review of Systems  Constitutional: Negative.   HENT: Negative.   Eyes: Negative.   Respiratory: Positive for shortness of breath.   Cardiovascular: Positive for chest pain.  Gastrointestinal: Negative.   Endocrine: Negative.   Musculoskeletal: Negative.   Skin: Negative.   Allergic/Immunologic: Negative.   Neurological: Negative.   Hematological: Negative.   Psychiatric/Behavioral: Positive for sleep disturbance.  All other systems reviewed and are negative.   BP 130/70  Pulse 56  Ht 5\' 9"  (1.753 m)  Wt 171 lb (77.565 kg)  BMI 25.24 kg/m2  Physical Exam  Nursing note and vitals reviewed. Constitutional: He is oriented to person, place, and time. He appears well-developed and well-nourished.  HENT:  Head: Normocephalic.  Nose: Nose normal.  Mouth/Throat: Oropharynx is clear and moist.  Eyes: Conjunctivae are normal. Pupils are equal, round, and reactive to light.  Neck: Normal range of motion. Neck supple. No JVD present.  Cardiovascular: Normal rate, regular rhythm, S1 normal, S2 normal and intact distal pulses.  Exam reveals no gallop and no friction rub.   Murmur heard.  Crescendo systolic murmur is present with a grade of 1/6  Pulmonary/Chest: Effort normal and breath sounds normal. No respiratory distress. He has no wheezes. He has no rales. He exhibits no tenderness.  Abdominal: Soft. Bowel sounds are normal. He exhibits no distension. There is no tenderness.  Musculoskeletal: Normal range of motion. He exhibits no edema and no tenderness.  Lymphadenopathy:    He has no cervical adenopathy.  Neurological: He is alert and oriented to person, place, and time. Coordination normal.  Skin: Skin is warm and dry. No rash noted. No erythema.  Psychiatric: He has a normal mood and affect. His behavior is normal. Judgment and thought content normal.       Assessment and Plan

## 2013-11-27 ENCOUNTER — Other Ambulatory Visit: Payer: Self-pay

## 2013-11-27 ENCOUNTER — Ambulatory Visit: Payer: Self-pay | Admitting: Cardiovascular Disease

## 2013-11-27 DIAGNOSIS — R079 Chest pain, unspecified: Secondary | ICD-10-CM

## 2013-11-27 MED ORDER — CHOLINE FENOFIBRATE 135 MG PO CPDR: 135.0000 mg | DELAYED_RELEASE_CAPSULE | Freq: Every day | ORAL | Status: DC

## 2013-11-27 NOTE — Telephone Encounter (Signed)
Refill sent for fenofibrate 134 mg

## 2013-12-01 ENCOUNTER — Telehealth: Payer: Self-pay

## 2013-12-01 ENCOUNTER — Inpatient Hospital Stay: Payer: Self-pay | Admitting: Specialist

## 2013-12-01 ENCOUNTER — Other Ambulatory Visit: Payer: Self-pay

## 2013-12-01 DIAGNOSIS — R262 Difficulty in walking, not elsewhere classified: Secondary | ICD-10-CM

## 2013-12-01 DIAGNOSIS — I25118 Atherosclerotic heart disease of native coronary artery with other forms of angina pectoris: Secondary | ICD-10-CM

## 2013-12-01 DIAGNOSIS — R079 Chest pain, unspecified: Secondary | ICD-10-CM

## 2013-12-01 DIAGNOSIS — R0602 Shortness of breath: Secondary | ICD-10-CM

## 2013-12-01 LAB — BASIC METABOLIC PANEL
ANION GAP: 9 (ref 7–16)
BUN: 12 mg/dL (ref 7–18)
CHLORIDE: 107 mmol/L (ref 98–107)
CREATININE: 1.69 mg/dL — AB (ref 0.60–1.30)
Calcium, Total: 8.5 mg/dL (ref 8.5–10.1)
Co2: 22 mmol/L (ref 21–32)
EGFR (African American): 47 — ABNORMAL LOW
GFR CALC NON AF AMER: 41 — AB
Glucose: 160 mg/dL — ABNORMAL HIGH (ref 65–99)
Osmolality: 279 (ref 275–301)
POTASSIUM: 3.6 mmol/L (ref 3.5–5.1)
SODIUM: 138 mmol/L (ref 136–145)

## 2013-12-01 LAB — URINALYSIS, COMPLETE
BACTERIA: NONE SEEN
Bilirubin,UR: NEGATIVE
Glucose,UR: NEGATIVE mg/dL (ref 0–75)
Ketone: NEGATIVE
Leukocyte Esterase: NEGATIVE
Nitrite: NEGATIVE
Ph: 5 (ref 4.5–8.0)
Protein: NEGATIVE
RBC,UR: 13 /HPF (ref 0–5)
Specific Gravity: 1.025 (ref 1.003–1.030)

## 2013-12-01 LAB — PRO B NATRIURETIC PEPTIDE: B-TYPE NATIURETIC PEPTID: 100 pg/mL (ref 0–125)

## 2013-12-01 LAB — CBC
HCT: 39.3 % — ABNORMAL LOW (ref 40.0–52.0)
HGB: 13.3 g/dL (ref 13.0–18.0)
MCH: 29.1 pg (ref 26.0–34.0)
MCHC: 33.7 g/dL (ref 32.0–36.0)
MCV: 86 fL (ref 80–100)
Platelet: 185 10*3/uL (ref 150–440)
RBC: 4.56 10*6/uL (ref 4.40–5.90)
RDW: 13.6 % (ref 11.5–14.5)
WBC: 8.1 10*3/uL (ref 3.8–10.6)

## 2013-12-01 LAB — CK TOTAL AND CKMB (NOT AT ARMC)
CK, Total: 120 U/L
CK-MB: 0.8 ng/mL (ref 0.5–3.6)

## 2013-12-01 LAB — PROTIME-INR
INR: 1.1
Prothrombin Time: 14.4 secs (ref 11.5–14.7)

## 2013-12-01 LAB — TROPONIN I: Troponin-I: <0.02 ng/mL

## 2013-12-01 LAB — MAGNESIUM: Magnesium: 1.5 mg/dL — ABNORMAL LOW

## 2013-12-01 NOTE — Telephone Encounter (Signed)
Pt called, states "I don't want to hear anything about my test results, I don't want to know my stress test results, don't call me"

## 2013-12-02 ENCOUNTER — Telehealth: Payer: Self-pay

## 2013-12-02 NOTE — Telephone Encounter (Signed)
Pt wife called and states pt is in hospital, states he "passed out at the ED door". States he had chest pain. Pt is in  Rm 108.

## 2013-12-03 ENCOUNTER — Ambulatory Visit (INDEPENDENT_AMBULATORY_CARE_PROVIDER_SITE_OTHER): Payer: Medicare Other | Admitting: Cardiovascular Disease

## 2013-12-03 ENCOUNTER — Encounter: Payer: Self-pay | Admitting: Cardiovascular Disease

## 2013-12-03 VITALS — BP 122/84 | HR 53 | Temp 97.6°F | Ht 68.0 in | Wt 169.0 lb

## 2013-12-03 DIAGNOSIS — I2510 Atherosclerotic heart disease of native coronary artery without angina pectoris: Secondary | ICD-10-CM

## 2013-12-03 DIAGNOSIS — I25118 Atherosclerotic heart disease of native coronary artery with other forms of angina pectoris: Secondary | ICD-10-CM

## 2013-12-03 DIAGNOSIS — R0602 Shortness of breath: Secondary | ICD-10-CM

## 2013-12-03 DIAGNOSIS — J069 Acute upper respiratory infection, unspecified: Secondary | ICD-10-CM

## 2013-12-03 DIAGNOSIS — I209 Angina pectoris, unspecified: Secondary | ICD-10-CM

## 2013-12-03 DIAGNOSIS — E785 Hyperlipidemia, unspecified: Secondary | ICD-10-CM

## 2013-12-03 LAB — URINE CULTURE

## 2013-12-03 MED ORDER — LEVOFLOXACIN 500 MG PO TABS: 500.0000 mg | ORAL_TABLET | Freq: Every day | ORAL | Status: DC

## 2013-12-03 NOTE — Assessment & Plan Note (Signed)
Hospital records were reviewed including lab work, discharge summary, x-rays. On clinical exam he has crackles at the right base concerning for pneumonia or bronchitis. He was not discharged on antibiotics. We will write a prescription for Levaquin 500 mg daily for the next 14 days.

## 2013-12-03 NOTE — Patient Instructions (Signed)
You likely have bronchitis or pneumonia Please start levaquin one pill a day for the next 2 weeks If symptoms do not start to improve,  Please call the office  Please call us if you have new issues that need to be addressed before your next appt.  Your physician wants you to follow-up in: 6 months.  You will receive a reminder letter in the mail two months in advance. If you don't receive a letter, please call our office to schedule the follow-up appointment.

## 2013-12-03 NOTE — Assessment & Plan Note (Signed)
Encouraged him to stay on his Crestor 

## 2013-12-03 NOTE — Assessment & Plan Note (Addendum)
Currently with no symptoms of angina. No further workup at this time. Continue current medication regimen. Recent negative stress test last week with no ischemia
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
## 2013-12-03 NOTE — Assessment & Plan Note (Signed)
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 Previous CT by pulmonary. No active cardiac issues

## 2013-12-06 LAB — CULTURE, BLOOD (SINGLE)

## 2013-12-08 ENCOUNTER — Encounter: Payer: Self-pay | Admitting: Cardiovascular Disease

## 2014-03-19 ENCOUNTER — Emergency Department: Payer: Self-pay | Admitting: Emergency Medicine

## 2014-03-23 ENCOUNTER — Emergency Department: Payer: Self-pay | Admitting: Emergency Medicine

## 2014-03-24 LAB — PRO B NATRIURETIC PEPTIDE: B-TYPE NATIURETIC PEPTID: 22 pg/mL (ref 0–125)

## 2014-03-24 LAB — BASIC METABOLIC PANEL
Anion Gap: 7 (ref 7–16)
BUN: 23 mg/dL — ABNORMAL HIGH (ref 7–18)
CALCIUM: 8.8 mg/dL (ref 8.5–10.1)
CHLORIDE: 105 mmol/L (ref 98–107)
CREATININE: 1.71 mg/dL — AB (ref 0.60–1.30)
Co2: 27 mmol/L (ref 21–32)
EGFR (African American): 52 — ABNORMAL LOW
GFR CALC NON AF AMER: 43 — AB
Glucose: 102 mg/dL — ABNORMAL HIGH (ref 65–99)
Osmolality: 281 (ref 275–301)
POTASSIUM: 4.6 mmol/L (ref 3.5–5.1)
SODIUM: 139 mmol/L (ref 136–145)

## 2014-03-24 LAB — CBC WITH DIFFERENTIAL/PLATELET
Basophil #: 0 10*3/uL (ref 0.0–0.1)
Basophil %: 0.4 %
Eosinophil #: 0.2 10*3/uL (ref 0.0–0.7)
Eosinophil %: 2 %
HCT: 42.6 % (ref 40.0–52.0)
HGB: 13.8 g/dL (ref 13.0–18.0)
LYMPHS ABS: 1.9 10*3/uL (ref 1.0–3.6)
LYMPHS PCT: 21.3 %
MCH: 28.3 pg (ref 26.0–34.0)
MCHC: 32.3 g/dL (ref 32.0–36.0)
MCV: 88 fL (ref 80–100)
MONO ABS: 1.3 x10 3/mm — AB (ref 0.2–1.0)
MONOS PCT: 13.9 %
Neutrophil #: 5.6 10*3/uL (ref 1.4–6.5)
Neutrophil %: 62.4 %
PLATELETS: 212 10*3/uL (ref 150–440)
RBC: 4.86 10*6/uL (ref 4.40–5.90)
RDW: 13 % (ref 11.5–14.5)
WBC: 9 10*3/uL (ref 3.8–10.6)

## 2014-03-24 LAB — TROPONIN I

## 2014-03-25 ENCOUNTER — Emergency Department: Payer: Self-pay | Admitting: Emergency Medicine

## 2014-05-11 ENCOUNTER — Telehealth: Payer: Self-pay | Admitting: Cardiovascular Disease

## 2014-05-11 MED ORDER — ROSUVASTATIN CALCIUM 10 MG PO TABS: 10.0000 mg | ORAL_TABLET | Freq: Every day | ORAL | Status: DC

## 2014-05-11 MED ORDER — CLOPIDOGREL BISULFATE 75 MG PO TABS: 75.0000 mg | ORAL_TABLET | Freq: Every day | ORAL | Status: DC

## 2014-05-11 MED ORDER — CHOLINE FENOFIBRATE 135 MG PO CPDR: 135.0000 mg | DELAYED_RELEASE_CAPSULE | Freq: Every day | ORAL | Status: DC

## 2014-05-11 MED ORDER — AMLODIPINE BESY-BENAZEPRIL HCL 5-10 MG PO CAPS: 1.0000 | ORAL_CAPSULE | Freq: Every day | ORAL | Status: DC

## 2014-05-11 NOTE — Telephone Encounter (Signed)
He'll need to see primary care for Xanax

## 2014-05-11 NOTE — Telephone Encounter (Signed)
Pt ex wife calling for patient needs zantac sent in for patient is not getting any sleep. So she gave him 3 pills of her supply of zantac but she did it to help him.   Pt needs refill on medication below.  Amlodipine Crestor change to Lipitor  Clopidogrel Bisulfate fenolivarate    While being on phone she knew pt needed an apt but he "Yelled" at her saying he would not come in. That we needed to just send in the RX and that would be it. Please call them if we have any questions.

## 2014-05-11 NOTE — Telephone Encounter (Signed)
Refill sent in for plavix, fenofibrate, lotrel and crestor. The patient will stay on crestor. The patient's wife states Shane Reid is not sleeping and needs some xanax not zantac to help him sleep. I told the patient Dr. Mariah Milling doesn't usually prescribe xanax. Please advise if you will send a Rx for xanax.

## 2014-05-12 NOTE — Telephone Encounter (Signed)
Spoke w/ Rosanne Ashing, pharmacist at [facility].  Advised him of Dr. Windell Hummingbird recommendation.  He reports that pt was told to try Zantac for his stomach, pt's wife misunderstood and gave him a Xanax, which worked for him and she is requesting rx for him.  Rosanne Ashing will relay message to them and have them call w/ any questions or concerns.

## 2014-06-22 ENCOUNTER — Encounter: Payer: Self-pay | Admitting: Cardiovascular Disease

## 2014-06-22 ENCOUNTER — Ambulatory Visit (INDEPENDENT_AMBULATORY_CARE_PROVIDER_SITE_OTHER): Payer: Medicare Other | Admitting: Cardiovascular Disease

## 2014-06-22 ENCOUNTER — Encounter (INDEPENDENT_AMBULATORY_CARE_PROVIDER_SITE_OTHER): Payer: Self-pay

## 2014-06-22 VITALS — BP 112/72 | HR 55 | Ht 69.0 in | Wt 166.0 lb

## 2014-06-22 DIAGNOSIS — R0789 Other chest pain: Secondary | ICD-10-CM

## 2014-06-22 DIAGNOSIS — J849 Interstitial pulmonary disease, unspecified: Secondary | ICD-10-CM

## 2014-06-22 DIAGNOSIS — R079 Chest pain, unspecified: Secondary | ICD-10-CM

## 2014-06-22 DIAGNOSIS — R0602 Shortness of breath: Secondary | ICD-10-CM

## 2014-06-22 DIAGNOSIS — E785 Hyperlipidemia, unspecified: Secondary | ICD-10-CM

## 2014-06-22 DIAGNOSIS — I25118 Atherosclerotic heart disease of native coronary artery with other forms of angina pectoris: Secondary | ICD-10-CM

## 2014-06-22 NOTE — Assessment & Plan Note (Signed)
Pain with palpation. No further workup at this time

## 2014-06-22 NOTE — Patient Instructions (Addendum)
You are doing well. No medication changes were made.  We will set up a visit with [facility] You are sched to see Dr. Dema Severin 06/23/14 @ 2:00  Please call us if you have new issues that need to be addressed before your next appt.  Your physician wants you to follow-up in: 6 months.  You will receive a reminder letter in the mail two months in advance. If you don't receive a letter, please call our office to schedule the follow-up appointment.

## 2014-06-22 NOTE — Assessment & Plan Note (Signed)
Symptomatic shortness of breath, crackling appreciated on exam worse on the right than the left, also chest x-ray with fibrosis/ILD. We'll refer to pulmonary in [place]
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
## 2014-06-22 NOTE — Assessment & Plan Note (Signed)
Chronic issue, getting worse over the past several years. Now with clearly appreciated crackles on exam. Previous right heart cath was normal. No ischemia. Seems to be a pulmonary issue. Will refer to pulmonary

## 2014-06-22 NOTE — Assessment & Plan Note (Signed)
Currently with no symptoms of angina. No further workup at this time. Continue current medication regimen. Recent negative stress test last week with no ischemia. He continues to have atypical chest pain

## 2014-06-22 NOTE — Assessment & Plan Note (Signed)
Encouraged him to stay on his Crestor. Goal LDL less than 70 

## 2014-06-23 ENCOUNTER — Encounter: Payer: Self-pay | Admitting: Internal Medicine

## 2014-06-23 ENCOUNTER — Ambulatory Visit (INDEPENDENT_AMBULATORY_CARE_PROVIDER_SITE_OTHER): Payer: Medicare Other | Admitting: Internal Medicine

## 2014-06-23 VITALS — BP 126/68 | HR 80 | Ht 69.0 in | Wt 167.6 lb

## 2014-06-23 DIAGNOSIS — J849 Interstitial pulmonary disease, unspecified: Secondary | ICD-10-CM

## 2014-06-23 DIAGNOSIS — R0602 Shortness of breath: Secondary | ICD-10-CM

## 2014-06-23 DIAGNOSIS — I25118 Atherosclerotic heart disease of native coronary artery with other forms of angina pectoris: Secondary | ICD-10-CM

## 2014-06-23 NOTE — Addendum Note (Signed)
Addended by: Alease Frame on: 06/23/2014 05:25 PM   Modules accepted: Orders

## 2014-06-23 NOTE — Patient Instructions (Signed)
Follow up with Dr. Dema Severin in 1 month 1. Pulmonary function testing, 6 minute walk test and high resolution CT chest scan with contrast prior to follow up visit. 2. Continue with your cardiac meds.
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
## 2014-06-29 ENCOUNTER — Telehealth: Payer: Self-pay | Admitting: Internal Medicine

## 2014-06-29 NOTE — Telephone Encounter (Signed)
Dr. Wilma Flavin order CT 1/26. Please advise PCC's thanks

## 2014-06-29 NOTE — Telephone Encounter (Signed)
Spoke with pt , he was confused about appts, he has a ct sched 07/01/14 @ [facility] location. And walk,pfts and appt 07/21/14 in [place]. Nothing cancelled, pt says he will be there .Kandice Hams'

## 2014-07-01 ENCOUNTER — Ambulatory Visit: Payer: Self-pay | Admitting: Internal Medicine

## 2014-07-21 ENCOUNTER — Ambulatory Visit: Payer: Medicare Other

## 2014-07-21 ENCOUNTER — Ambulatory Visit: Payer: Medicare Other | Admitting: Internal Medicine

## 2014-07-27 ENCOUNTER — Encounter: Payer: Self-pay | Admitting: Internal Medicine

## 2014-07-30 ENCOUNTER — Ambulatory Visit (INDEPENDENT_AMBULATORY_CARE_PROVIDER_SITE_OTHER): Payer: Medicare Other | Admitting: Internal Medicine

## 2014-07-30 ENCOUNTER — Ambulatory Visit: Payer: Medicare Other | Admitting: Internal Medicine

## 2014-07-30 ENCOUNTER — Ambulatory Visit: Payer: Medicare Other

## 2014-07-30 ENCOUNTER — Encounter: Payer: Self-pay | Admitting: Internal Medicine

## 2014-07-30 VITALS — BP 130/84 | HR 70 | Temp 97.9°F | Ht 69.0 in | Wt 166.0 lb

## 2014-07-30 DIAGNOSIS — J849 Interstitial pulmonary disease, unspecified: Secondary | ICD-10-CM

## 2014-07-30 DIAGNOSIS — I25118 Atherosclerotic heart disease of native coronary artery with other forms of angina pectoris: Secondary | ICD-10-CM

## 2014-07-30 DIAGNOSIS — R0602 Shortness of breath: Secondary | ICD-10-CM

## 2014-07-30 DIAGNOSIS — R0600 Dyspnea, unspecified: Secondary | ICD-10-CM

## 2014-07-30 LAB — PULMONARY FUNCTION TEST
DL/VA % pred: 85 %
DL/VA: 3.9 ml/min/mmHg/L
DLCO unc % pred: 57 %
DLCO unc: 17.95 ml/min/mmHg
FEF 25-75 Post: 5.13 L/sec
FEF 25-75 Pre: 4.19 L/sec
FEF2575-%Change-Post: 22 %
FEF2575-%PRED-POST: 212 %
FEF2575-%PRED-PRE: 173 %
FEV1-%Change-Post: 4 %
FEV1-%PRED-POST: 90 %
FEV1-%Pred-Pre: 86 %
FEV1-POST: 2.87 L
FEV1-Pre: 2.74 L
FEV1FVC-%Change-Post: 2 %
FEV1FVC-%PRED-PRE: 116 %
FEV6-%Change-Post: 2 %
FEV6-%Pred-Post: 80 %
FEV6-%Pred-Pre: 78 %
FEV6-Post: 3.26 L
FEV6-Pre: 3.19 L
FEV6FVC-%Change-Post: 0 %
FEV6FVC-%PRED-POST: 106 %
FEV6FVC-%Pred-Pre: 106 %
FVC-%Change-Post: 2 %
FVC-%Pred-Post: 76 %
FVC-%Pred-Pre: 74 %
FVC-PRE: 3.19 L
FVC-Post: 3.26 L
Post FEV1/FVC ratio: 88 %
Post FEV6/FVC ratio: 100 %
Pre FEV1/FVC ratio: 86 %
Pre FEV6/FVC Ratio: 100 %
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
## 2014-07-30 NOTE — Progress Notes (Signed)
PFT performed today. 

## 2014-08-01 NOTE — Assessment & Plan Note (Signed)
Chronic issue getting worse over the past several years.  Previous right heart cath was normal. No ischemia.   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 Dalbert Mayotte for UIP treatment plan of care and further workup

## 2014-08-01 NOTE — Assessment & Plan Note (Signed)
Chronic issue getting worse over the past several years.  Previous right heart cath was normal. No ischemia.   DDx - ILD, UIP  Plan: - secondary to underlying ILD in combination with dCHF - continue follow up with Dr. Dalbert Mayotte for [facility] treatment plan of care

## 2014-08-26 ENCOUNTER — Telehealth: Payer: Self-pay | Admitting: Internal Medicine
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
## 2014-08-27 ENCOUNTER — Other Ambulatory Visit: Payer: Self-pay | Admitting: Cardiovascular Disease
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
## 2014-09-03 NOTE — Telephone Encounter (Signed)
Pt rescheduled for 4/8. Nothing further needed at this time.

## 2014-09-04 ENCOUNTER — Ambulatory Visit (INDEPENDENT_AMBULATORY_CARE_PROVIDER_SITE_OTHER): Payer: Medicare Other | Admitting: Internal Medicine

## 2014-09-04 ENCOUNTER — Encounter: Payer: Self-pay | Admitting: Internal Medicine

## 2014-09-04 ENCOUNTER — Ambulatory Visit: Payer: Medicare Other | Admitting: Internal Medicine

## 2014-09-04 ENCOUNTER — Other Ambulatory Visit (INDEPENDENT_AMBULATORY_CARE_PROVIDER_SITE_OTHER): Payer: Medicare Other

## 2014-09-04 VITALS — BP 132/78 | HR 69 | Ht 69.0 in | Wt 166.6 lb

## 2014-09-04 DIAGNOSIS — J849 Interstitial pulmonary disease, unspecified: Secondary | ICD-10-CM | POA: Diagnosis not present

## 2014-09-04 DIAGNOSIS — I25118 Atherosclerotic heart disease of native coronary artery with other forms of angina pectoris: Secondary | ICD-10-CM | POA: Diagnosis not present

## 2014-09-04 DIAGNOSIS — R0602 Shortness of breath: Secondary | ICD-10-CM

## 2014-09-04 LAB — SEDIMENTATION RATE: Sed Rate: 17 mm/hr (ref 0–22)
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
## 2014-09-04 NOTE — Patient Instructions (Addendum)
ICD-9-CM ICD-10-CM   1. ILD (interstitial lung disease) 515 J84.9   2. Shortness of breath 786.05 R06.02      -  You have Interstitial Lung Disease (ILD) - This is definitely contributing to your shortness of breath  -  There are > 100 varieties of this - To narrow down possibilities and assess severity please do the following tests  - do autoimmune panel: Serum: ESR, ANA, ACE, DS-DNA, RF, anti-CCP, ssA, ssB, scl-70, ANCA, MPO and PR-3 antibodies, Total CK,  Aldolase,  Hypersensitivity Pneumonitis Panel  Followup  - next few weeks with Dr Chase Caller to discuss test results and followup

## 2014-09-05 LAB — RHEUMATOID FACTOR

## 2014-09-05 LAB — CK TOTAL AND CKMB (NOT AT ARMC)
CK, MB: 2.1 ng/mL (ref 0.0–5.0)
RELATIVE INDEX: 1.4 (ref 0.0–4.0)
Total CK: 145 U/L (ref 7–232)

## 2014-09-07 LAB — ANA: Anti Nuclear Antibody(ANA): NEGATIVE

## 2014-09-07 LAB — CYCLIC CITRUL PEPTIDE ANTIBODY, IGG: Cyclic Citrullin Peptide Ab: <2 U/mL (ref 0.0–5.0)

## 2014-09-07 LAB — ANTI-SCLERODERMA ANTIBODY: Scleroderma (Scl-70) (ENA) Antibody, IgG: <1

## 2014-09-07 LAB — ANGIOTENSIN CONVERTING ENZYME: Angiotensin-Converting Enzyme: 7 U/L — ABNORMAL LOW (ref 8–52)

## 2014-09-07 LAB — SJOGREN'S SYNDROME ANTIBODS(SSA + SSB)
SSA (Ro) (ENA) Antibody, IgG: <1
SSB (La) (ENA) Antibody, IgG: <1

## 2014-09-07 LAB — ANTI-DNA ANTIBODY, DOUBLE-STRANDED: ds DNA Ab: <1 IU/mL

## 2014-09-07 LAB — MPO/PR-3 (ANCA) ANTIBODIES
Myeloperoxidase Abs: <1
Serine Protease 3: <1

## 2014-09-07 LAB — ANCA SCREEN W REFLEX TITER
Atypical p-ANCA Screen: NEGATIVE
C-ANCA SCREEN: NEGATIVE
p-ANCA Screen: NEGATIVE

## 2014-09-08 LAB — ALDOLASE: Aldolase: 6.1 U/L (ref <=8.1)

## 2014-09-09 LAB — HYPERSENSITIVITY PNEUMONITIS
A. Pullulans Abs: NEGATIVE
A.Fumigatus #1 Abs: NEGATIVE
Micropolyspora faeni, IgG: NEGATIVE
Pigeon Serum Abs: NEGATIVE
Thermoact. Saccharii: NEGATIVE
Thermoactinomyces vulgaris, IgG: NEGATIVE

## 2014-09-19 NOTE — Discharge Summary (Signed)
PATIENT NAME:  Shane Reid, Shane Reid MR#:  [id number] DATE OF BIRTH:  1945/06/29  DATE OF ADMISSION:  12/01/2013 DATE OF DISCHARGE:  12/02/2013  The patient left AGAINST MEDICAL ADVICE the morning of 12/02/2013.   For a detailed note, please take a look at the history and physical done on admission by Dr. Angelica Ran.   BRIEF HOSPITAL COURSE: This is a 69 year old male with past medical history of GERD,  hypertension, asthma, coronary artery disease status post PCI and stent 2% who presented to the hospital due to a fever of 103 with a productive cough. The patient was admitted to the hospital with a working diagnosis of sepsis likely secondary to possible underlying pulmonary condition thought to be possible pneumonia, although the chest x-ray was negative. The patient was also noted to have acute renal failure and was started on IV fluid hydration. The patient was being maintained on IV ceftriaxone and Zithromax for underlying pneumonia. On the morning of 12/02/2013, the patient was feeling better, and before being seen by a physician, wanted to leave against medical advice, and therefore left on the morning of July 7. The patient did sign the AGAINST MEDICAL ADVICE form. He was not discharged on any antibiotics.   TIME SPENT: 35 minutes.    ____________________________ Rolly Pancake. Cherlynn Kaiser, MD vjs:ts D: 12/02/2013 15:44:12 ET T: 12/02/2013 18:46:08 ET JOB#: [id number]  cc: Rolly Pancake. Cherlynn Kaiser, MD, <Dictator> Houston Siren MD ELECTRONICALLY SIGNED 12/12/2013 20:32

## 2014-09-19 NOTE — H&P (Signed)
PATIENT NAME:  Shane Reid, Shane Reid MR#:  [id number] DATE OF BIRTH:  [birth year]  DATE OF ADMISSION:  12/01/2013  REFERRING PHYSICIAN: Margarita Grizzle.   PRIMARY CARE PHYSICIAN: Lucina Mellow.   CHIEF COMPLAINT: Shortness of breath.   HISTORY OF PRESENT ILLNESS: A 69 year old Caucasian gentleman with history of coronary artery disease status post PCI with stent placement, hypertension, asthma, presenting with fever, shortness of breath. Describes one day duration of fever and shortness of breath as well as associated cough productive of white sputum. Fever measured at home 103.5 degrees Fahrenheit. He has also been complaining of chest pain; however, this has been constant for 1 month's duration, unchanged, dull pressure, retrosternal in location, nonradiating. No worsening or relieving factors. Actually had stress test performed 11/27/2013 with Dr. Mariah Milling and had a normal stress.   REVIEW OF SYSTEMS:   CONSTITUTIONAL: Positive fevers, chills, fatigue. Denies weakness.  EYES: Denies blurred vision, double vision, eye pain.  EARS, NOSE, THROAT: Denies tinnitus, ear pain, hearing loss.   RESPIRATORY: Positive for cough, shortness of breath as described above.  CARDIOVASCULAR: Positive for chest pain as described above. Denies any palpitations, edema.  GASTROINTESTINAL: Denies nausea, vomiting, diarrhea, abdominal pain.  GENITOURINARY: Denies dysuria or hematuria.  ENDOCRINE: Denies nocturia or thyroid problems.  HEMATOLOGIC AND LYMPHATIC: Denies easy bruising or bleeding.  SKIN: Denies rash or lesion.  MUSCULOSKELETAL: Denies pain in neck, back, shoulder, knees, hips or arthritic symptoms.  NEUROLOGIC: Denies paralysis, paresthesias.  PSYCHIATRIC: Denies anxiety or depressive symptoms.   Otherwise, full review of systems performed by me is negative.   PAST MEDICAL HISTORY: Hypertension, asthma, GERD, coronary artery disease status post PCI and stent placement.   SOCIAL HISTORY: Remote tobacco use. Denies any  alcohol or drug use. Ex-construction worker with multiple dust exposures.   FAMILY HISTORY: Positive for coronary artery disease.   ALLERGIES: No known drug allergies.   HOME MEDICATIONS: Include aspirin 81 mg p.o. daily, Crestor 10 mg p.o. daily, fenofibrate 134 mg p.o. daily, amlodipine/benazepril 5/10 mg p.o. daily, Plavix 75 mg p.o. daily, multivitamin 1 tablet p.o. daily.   PHYSICAL EXAMINATION:  VITAL SIGNS: Temperature 103.1, heart rate 91, respirations 32, blood pressure 129/73, saturating 95% on room air. Weight 71.7 kg, BMI 23.4.  GENERAL: Well-nourished Caucasian gentleman, currently in no acute distress.  HEAD: Normocephalic, atraumatic.  EYES: Pupils equal, round and reactive to light. Extraocular muscles intact. No scleral icterus.  MOUTH: Moist mucosal membranes. Dentition intact. No abscess noted.  EARS, NOSE, THROAT: Clear without exudates. No external lesions.  NECK: Supple. No thyromegaly. No nodules. No JVD.  PULMONARY: Diffuse coarse breath sounds bilaterally with scattered rhonchi. No use of accessory muscles, though tachypneic. Good respiratory effort.  CHEST: Nontender to palpation.  CARDIOVASCULAR: S1, S2, regular rate and rhythm. No murmurs, rubs or gallops. No edema. Pedal pulses 2+ bilaterally.  GASTROINTESTINAL: Soft, nontender, nondistended. No masses. Positive bowel sounds. No hepatosplenomegaly.  MUSCULOSKELETAL: No swelling, clubbing or edema. Range of motion full in all extremities.  NEUROLOGIC: Cranial nerves II through XII intact. No gross focal neurologic deficits. Sensation intact. Reflexes intact.  SKIN: No ulceration, lesions, rash, cyanosis. Skin warm, dry. Turgor intact.  PSYCHIATRIC: Mood and affect within normal limits. The patient is awake, alert and oriented x 3. Insight and judgment intact.   LABORATORY DATA: Sodium 138, potassium 3.6, chloride 107, bicarb 22, BUN 12, creatinine 1.69, glucose 160. Magnesium 1.5. WBC 8.1, hemoglobin 13.3,  platelets 185. Urinalysis negative for evidence of infection. Chest x-ray performed revealing diffuse interstitial pattern  to the lungs, probably representing chronic fibrosis, as well as superimposed edema or pneumonitis not excluded.   ASSESSMENT AND PLAN: A 69 year old gentleman with history of coronary artery disease status post percutaneous coronary intervention and stent placement, presenting with fever and shortness of breath.  1. Sepsis: Meeting sepsis criteria by temperature, respiratory rate, heart rate. Panculture including blood and urine. Antibiotic coverage with azithromycin and ceftriaxone for presumptive pulmonary etiology, though no definitive pneumonia on chest x-ray. Intravenous fluid hydration. Keep mean arterial pressure greater than 65.  2. Acute kidney injury: Intravenous fluid hydration. Follow urine output and renal function.  3. Hypomagnesemia: Replace to goal of 2.  4. Hyperlipidemia: Continue statin therapy.  5. Venous thromboembolism prophylaxis with heparin subcutaneous.   The patient is FULL CODE.   TIME SPENT: 45 minutes.   ____________________________ Cletis Athens. Hower, MD dkh:gb D: 12/01/2013 23:14:08 ET T: 12/02/2013 01:34:26 ET JOB#: [id number]  cc: Cletis Athens. Hower, MD, <Dictator> DAVID Synetta Shadow MD ELECTRONICALLY SIGNED 12/03/2013 3:24

## 2014-09-25 NOTE — Progress Notes (Signed)
Quick Note:  Called and spoke to pt. Informed pt of the results and recs per MR. Pt verbalized understanding and denied any further questions or concerns at this time. ______ 

## 2014-10-05 ENCOUNTER — Encounter: Payer: Self-pay | Admitting: Internal Medicine

## 2014-10-05 ENCOUNTER — Ambulatory Visit (INDEPENDENT_AMBULATORY_CARE_PROVIDER_SITE_OTHER): Payer: Medicare Other | Admitting: Internal Medicine

## 2014-10-05 ENCOUNTER — Telehealth: Payer: Self-pay | Admitting: *Deleted

## 2014-10-05 VITALS — BP 118/70 | HR 65 | Ht 69.0 in | Wt 167.0 lb

## 2014-10-05 DIAGNOSIS — J849 Interstitial pulmonary disease, unspecified: Secondary | ICD-10-CM | POA: Diagnosis not present

## 2014-10-05 DIAGNOSIS — I25118 Atherosclerotic heart disease of native coronary artery with other forms of angina pectoris: Secondary | ICD-10-CM

## 2014-10-05 NOTE — Patient Instructions (Addendum)
ICD-9-CM ICD-10-CM   1. ILD (interstitial lung disease) 515 J84.9    Top suspicion for interstitial lung disease is either hypersensitivity pneumonitis or idiopathic pulmonary fibrosis  Autoimmune interstitial lung disease has been ruled out  Plan - Recommend surgical lung biopsy to differentiate between the 2 - Might need cardiac clearance prior to lung biopsy - We'll discuss with Dr. Vivien Rota and see if he can have this done at [facility]
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
## 2014-10-05 NOTE — Telephone Encounter (Signed)
Pt x wife called to let us know that pt just called her to let us know he was diagnosed with IPF lung disease.  She mention Dr Mariah Milling is there favorite doctor and they want him to know No need to call unless we have questions or concerns

## 2014-10-06 ENCOUNTER — Telehealth: Payer: Self-pay | Admitting: Internal Medicine

## 2014-10-06 DIAGNOSIS — J849 Interstitial pulmonary disease, unspecified: Secondary | ICD-10-CM

## 2014-10-06 NOTE — Telephone Encounter (Signed)
Shane Reid (cc to Dr Dema Severin)  Please refer him to Dr Inez Catalina - Chief Strategy Officer at [place], [place]. Biiopsy is to be sent to Dr Mearl Latin in [facility] or [facility] for opinion. Needs it in 3 lobes. Biopsy is NOT to be sent to [facility]. Patient is to come and see me 3-4 weeks after bx.   Also get me Dr Inez Catalina number  Thanks  Dr. Kalman Shan, M.D., [facility].C.P Pulmonary and Critical Care Medicine Staff Physician [facility] [place] [facility] Pager: [phone], If no answer or between  15:00h - 7:00h: call [phone]  10/06/2014 3:32 PM

## 2014-10-06 NOTE — Telephone Encounter (Signed)
Wife does not want husband to know she called.  Patient is outside mowing the yard and wife says he is not supposed to do this b/c he has lung disease and he needs surgery.  Per wife patient needs surgery and does not want to and is refusing to have it in  b/c he would rather be at [facility] as this is close to home and family.

## 2014-10-07 NOTE — Telephone Encounter (Signed)
Called and spoke to pt. Informed him of the recs per MR. Order placed. Pt verbalized understanding and is aware to contact us once a biopsy date has been placed. Nothing further needed.

## 2014-10-20 ENCOUNTER — Ambulatory Visit: Payer: Medicare Other | Admitting: Internal Medicine

## 2014-10-20 ENCOUNTER — Telehealth: Payer: Self-pay | Admitting: Internal Medicine

## 2014-10-20 NOTE — Telephone Encounter (Signed)
Spoke with Collette at [facility] and she will call pt today to give him appt time with Dr Thelma Barge.  Spoke with pt and informed him that she will be calling with appt.

## 2014-10-29 ENCOUNTER — Encounter: Payer: Self-pay | Admitting: Cardiothoracic Surgery

## 2014-10-29 ENCOUNTER — Other Ambulatory Visit: Payer: Self-pay | Admitting: Cardiothoracic Surgery

## 2014-10-29 ENCOUNTER — Inpatient Hospital Stay: Payer: Medicare Other | Attending: Cardiothoracic Surgery | Admitting: Cardiothoracic Surgery

## 2014-10-29 ENCOUNTER — Inpatient Hospital Stay: Payer: Medicare Other

## 2014-10-29 DIAGNOSIS — Z01818 Encounter for other preprocedural examination: Secondary | ICD-10-CM

## 2014-10-29 DIAGNOSIS — J849 Interstitial pulmonary disease, unspecified: Secondary | ICD-10-CM | POA: Insufficient documentation

## 2014-10-29 DIAGNOSIS — R911 Solitary pulmonary nodule: Secondary | ICD-10-CM

## 2014-10-29 DIAGNOSIS — I25118 Atherosclerotic heart disease of native coronary artery with other forms of angina pectoris: Secondary | ICD-10-CM

## 2014-10-29 LAB — CBC WITH DIFFERENTIAL/PLATELET
BASOS ABS: 0.1 10*3/uL (ref 0–0.1)
BASOS PCT: 1 %
EOS PCT: 3 %
Eosinophils Absolute: 0.3 10*3/uL (ref 0–0.7)
HEMATOCRIT: 42.8 % (ref 40.0–52.0)
HEMOGLOBIN: 14 g/dL (ref 13.0–18.0)
LYMPHS PCT: 25 %
Lymphs Abs: 2.2 10*3/uL (ref 1.0–3.6)
MCH: 28 pg (ref 26.0–34.0)
MCHC: 32.7 g/dL (ref 32.0–36.0)
MCV: 85.7 fL (ref 80.0–100.0)
MONO ABS: 0.9 10*3/uL (ref 0.2–1.0)
MONOS PCT: 10 %
Neutro Abs: 5.4 10*3/uL (ref 1.4–6.5)
Neutrophils Relative %: 61 %
Platelets: 243 10*3/uL (ref 150–440)
RBC: 4.99 MIL/uL (ref 4.40–5.90)
RDW: 14 % (ref 11.5–14.5)
WBC: 8.9 10*3/uL (ref 3.8–10.6)

## 2014-10-29 LAB — COMPREHENSIVE METABOLIC PANEL
ALT: 30 U/L (ref 17–63)
ANION GAP: 4 — AB (ref 5–15)
AST: 34 U/L (ref 15–41)
Albumin: 4 g/dL (ref 3.5–5.0)
Alkaline Phosphatase: 44 U/L (ref 38–126)
BUN: 12 mg/dL (ref 6–20)
CHLORIDE: 109 mmol/L (ref 101–111)
CO2: 25 mmol/L (ref 22–32)
Calcium: 8.5 mg/dL — ABNORMAL LOW (ref 8.9–10.3)
Creatinine, Ser: 1.24 mg/dL (ref 0.61–1.24)
GFR calc Af Amer: >60 mL/min (ref >60)
GFR calc non Af Amer: 58 mL/min — ABNORMAL LOW (ref >60)
GLUCOSE: 102 mg/dL — AB (ref 65–99)
POTASSIUM: 3.9 mmol/L (ref 3.5–5.1)
Sodium: 138 mmol/L (ref 135–145)
Total Bilirubin: 0.5 mg/dL (ref 0.3–1.2)
Total Protein: 7.4 g/dL (ref 6.5–8.1)

## 2014-10-29 LAB — PROTIME-INR
INR: 0.98
Prothrombin Time: 13.2 seconds (ref 11.4–15.0)

## 2014-10-29 LAB — APTT: aPTT: 27 seconds (ref 24–36)

## 2014-10-29 NOTE — Progress Notes (Signed)
Patient ID: Shane Shirts., male   DOB: July 29, 1945, 69 y.o.   MRN: [id number]  Chief Complaint  Patient presents with  . PT Initial Evaluation    ILD    HPI Location, Quality, Duration, Severity, Timing, Context, Modifying Factors, Associated Signs and Symptoms.  Shane Culleton. is a 69 y.o. male.  This patient is a 69 year old gentleman with a long-standing history of interstitial lung disease. The patient states that initially he was seen at [facility] in 2004 where he underwent some type of cardiac workup as well as some pulmonary function studies. He states that his pulmonary function studies were normal at that time. He was being evaluated for disability secondary to his underlying cardiac disease. Over the course of the last several years he is developing increasing shortness of breath occasionally accompanied by a cough. He's had no hemoptysis or weight loss. Over the course of these last 2 years he's been extensively evaluated by both his cardiologist and a pulmonologist. He seen several pulmonologist here in our office and he was ultimately recommended to undergo a CT scan of the chest. Revealed evidence of interstitial lung disease most consistent with pulmonary fibrosis. He is sent here today for consideration of an open lung biopsy. The patient does see Dr. Marcial Pacas: As his primary cardiologist and he does take aspirin and Plavix. He has not had any chest pain. He states that he is unable to walk more than 50 feet without significant shortness of breath. He was able to walk up one flight of stairs today and had significant discomfort with that. He does not use oxygen. He is aware of his underlying diagnosis and would like to pursue a lung biopsy for definitive diagnosis and management.   Past Medical History  Diagnosis Date  . Anxiety   . Asthma   . Depression   . GERD (gastroesophageal reflux disease)   . Hypercholesteremia   . Bradycardia   . Coronary artery disease      s/p stent 2008  . Other activity(E029.9)     Social stressor, wife with mental illness  . Anginal pain     Past Surgical History  Procedure Laterality Date  . Cardiac catheterization    . Testicle surgery    . Esophagus stretch      Family History  Problem Relation Age of Onset  . Coronary artery disease Mother     s/p CABG  . Heart failure Mother   . Heart disease Other   . Depression Other     Social History History  Substance Use Topics  . Smoking status: Former Smoker -- 0.50 packs/day for 1 years    Types: Cigarettes    Quit date: 05/09/1968  . Smokeless tobacco: Never Used  . Alcohol Use: No    Allergies  Allergen Reactions  . Metoprolol Succinate     REACTION: Intol.  . Niacin   . Sertraline Hcl     Current Outpatient Prescriptions  Medication Sig Dispense Refill  . albuterol (PROVENTIL HFA;VENTOLIN HFA) 108 (90 BASE) MCG/ACT inhaler Inhale 2 puffs into the lungs every 6 (six) hours as needed for wheezing or shortness of breath.    [name] albuterol (PROVENTIL) (2.5 MG/3ML) 0.083% nebulizer solution Take 3 mLs (2.5 mg total) by nebulization every 6 (six) hours as needed. 75 mL 11  . amLODipine-benazepril (LOTREL) 5-10 MG per capsule Take 1 capsule by mouth daily. 90 capsule 1  . aspirin 81 MG tablet Take 81 mg by mouth  daily.    . Choline Fenofibrate (FENOFIBRIC ACID) 135 MG CPDR TAKE ONE CAPSULE BY MOUTH DAILY 30 capsule 3  . clopidogrel (PLAVIX) 75 MG tablet TAKE ONE (1) TABLET BY MOUTH EVERY DAY 30 tablet 3  . CRESTOR 10 MG tablet TAKE ONE (1) TABLET BY MOUTH EVERY DAY 30 tablet 3  . Multiple Vitamin (MULTIVITAMIN) tablet Take 1 tablet by mouth daily.      . naproxen (EC NAPROSYN) 500 MG EC tablet Take 1 tablet (500 mg total) by mouth 2 (two) times daily as needed. 60 tablet 3  . nitroGLYCERIN (NITROSTAT) 0.4 MG SL tablet Place 1 tablet (0.4 mg total) under the tongue every 5 (five) minutes as needed. 25 tablet 6   No current facility-administered  medications for this visit.      Review of Systems A 10 point review of systems was asked and was negative except for the following positive findings hearing difficulty, frequent cough, shortness of breath  Blood pressure 146/79, pulse 61, temperature 96.7 F (35.9 C), temperature source Tympanic, resp. rate 20, height  (1.753 m), weight 168 lb 10.4 oz (76.5 kg), SpO2 98 %.  Physical Exam CONSTITUTIONAL:  Pleasant, well-developed, well-nourished, and in no acute distress. EYES: Pupils equal and reactive to light, Sclera non-icteric EARS, NOSE, MOUTH AND THROAT:  The oropharynx was clear.  Dentition is good repair.  Oral mucosa pink and moist. LYMPH NODES:  Lymph nodes in the neck and axillae were normal RESPIRATORY:  Lungs showed by basilar Velcro rales..  Normal respiratory effort without pathologic use of accessory muscles of respiration CARDIOVASCULAR: Heart was regular without murmurs.  There were no carotid bruits. GI: The abdomen was soft, nontender, and nondistended. There were no palpable masses. There was no hepatosplenomegaly. There were normal bowel sounds in all quadrants. GU:  Rectal deferred.   MUSCULOSKELETAL:  Normal muscle strength and tone.  No clubbing or cyanosis.   SKIN:  There were no pathologic skin lesions.  There were no nodules on palpation. NEUROLOGIC:  Sensation is normal.  Cranial nerves are grossly intact. PSYCH:  Oriented to person, place and time.  Mood and affect are normal.  Data Reviewed I have independently reviewed the patient's CT scan.  I have personally reviewed the patient's imaging, laboratory findings and medical records.    Assessment    I have independently reviewed the patient's CT scan. I believe he most likely has interstitial initial lung disease most consistent with idiopathic pulmonary fibrosis. I have recommended that he undergo a right thoracoscopy with lung biopsy. I explained to him the indications and risks of the surgery.  Risks of bleeding infection air leak and death were all reviewed. He would like [place] to proceed. A routine preoperative lab studies today. I will schedule his surgery as soon as he is able to stop his aspirin and Plavix. He will see Dr. Marcial Pacas: For preoperative cardiac clearance. I will keep Dr. Dema Severin and Dr. Marchelle Gearing aware.    Plan    We will schedule surgery as soon as possible. He will see Dr. Lewie Loron next week. I have reviewed all this with the patient and he is in agreement.        Hulda Marin 10/29/2014, 10:24 AM

## 2014-11-02 ENCOUNTER — Ambulatory Visit (INDEPENDENT_AMBULATORY_CARE_PROVIDER_SITE_OTHER): Payer: Medicare Other | Admitting: Internal Medicine

## 2014-11-02 ENCOUNTER — Encounter: Payer: Self-pay | Admitting: Internal Medicine

## 2014-11-02 ENCOUNTER — Telehealth: Payer: Self-pay | Admitting: Cardiovascular Disease

## 2014-11-02 VITALS — BP 140/84 | HR 69 | Temp 98.6°F | Ht 69.0 in | Wt 169.0 lb

## 2014-11-02 DIAGNOSIS — J849 Interstitial pulmonary disease, unspecified: Secondary | ICD-10-CM | POA: Diagnosis not present

## 2014-11-02 DIAGNOSIS — I25118 Atherosclerotic heart disease of native coronary artery with other forms of angina pectoris: Secondary | ICD-10-CM

## 2014-11-02 MED ORDER — CLOPIDOGREL BISULFATE 75 MG PO TABS: ORAL_TABLET | ORAL | Status: DC

## 2014-11-02 MED ORDER — ROSUVASTATIN CALCIUM 10 MG PO TABS: ORAL_TABLET | ORAL | Status: DC

## 2014-11-02 MED ORDER — FENOFIBRIC ACID 135 MG PO CPDR: 1.0000 | DELAYED_RELEASE_CAPSULE | Freq: Every day | ORAL | Status: DC

## 2014-11-02 MED ORDER — AMLODIPINE BESY-BENAZEPRIL HCL 5-10 MG PO CAPS: 1.0000 | ORAL_CAPSULE | Freq: Every day | ORAL | Status: DC
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
## 2014-11-02 NOTE — Telephone Encounter (Signed)
Refill sent for crestor, plavix, lotrel and fenofibic

## 2014-11-02 NOTE — Telephone Encounter (Signed)
°  1. Which medications need to be refilled? Crestor 10 mg PO Daily , Plavix 75 mg PO Daily, Lotrel 5-10 mg Daily , Fenofibric Acid 135 mg CPDR Once Daily   2. Which pharmacy is medication to be sent to?[facility]   3. Do they need a 30 day or 90 day supply? Did not know  4. Would they like a call back once the medication has been sent to the pharmacy? yes

## 2014-11-02 NOTE — Assessment & Plan Note (Signed)
Chronic issue getting worse over the past several years.  Previous right heart cath was normal. No ischemia.   DDx - ILD, UIP  07/30/14 - 327m, 97%  HRCT with traction bronchiectasis and subpleural fibrosis pattern consistent with Usual Interstitial Pneumonitis (UIP)  Plan: - Patient has already had preoperative visit with cardiothoracic surgery, currently awaiting cardiology clearance. -Patient advised to continue avoid any forms of tobacco, burning, wood-burning. -Patient will follow-up with Dr. Marchelle Gearing after surgical lung biopsy.

## 2014-11-02 NOTE — Patient Instructions (Signed)
Follow-up with Dr. Dema Severin in 3 months. -Follow-up with cardiology for clearance prior to surgical lung biopsy -Keep scheduled appointment with Dr. Jeni Salles after surgical lung biopsy

## 2014-11-05 ENCOUNTER — Telehealth: Payer: Self-pay | Admitting: *Deleted

## 2014-11-05 ENCOUNTER — Encounter: Payer: Self-pay | Admitting: Cardiovascular Disease

## 2014-11-05 ENCOUNTER — Ambulatory Visit (INDEPENDENT_AMBULATORY_CARE_PROVIDER_SITE_OTHER): Payer: Medicare Other | Admitting: Cardiovascular Disease

## 2014-11-05 VITALS — BP 120/68 | HR 59 | Ht 69.0 in | Wt 170.8 lb

## 2014-11-05 DIAGNOSIS — I25118 Atherosclerotic heart disease of native coronary artery with other forms of angina pectoris: Secondary | ICD-10-CM

## 2014-11-05 DIAGNOSIS — Z01810 Encounter for preprocedural cardiovascular examination: Secondary | ICD-10-CM

## 2014-11-05 NOTE — Assessment & Plan Note (Signed)
The patient has known history of coronary artery disease with previous stenting most recently in 2008. He has been on dual antiplatelets therapy since then. He has no symptoms suggestive of angina and has reasonable functional capacity. EKG is unremarkable. He had a stress test done within the last year which was normal. Thus, no further cardiac workup is recommended. The patient can proceed at an overall low risk from a cardiac standpoint. Aspirin and Plavix can be held one week before biopsy and should be resumed as soon as safe from a bleeding standpoint.
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
## 2014-11-05 NOTE — Patient Instructions (Signed)
You are at low risk for lung biopsy from a cardiac standpoint.   Stop Aspirin and Plavix 1 week before biopsy and resume after after checking with Dr. Thelma Barge.   Keep follow up with Dr. Mariah Milling as planned.

## 2014-11-05 NOTE — Assessment & Plan Note (Signed)
No anginal symptoms. Continue medical therapy. 

## 2014-11-05 NOTE — Telephone Encounter (Signed)
Patient has obtained cardiac clearance. "pt is at 'low-risk' for lung bx from cardiac standpoint.
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
## 2014-11-11 ENCOUNTER — Telehealth: Payer: Self-pay | Admitting: *Deleted

## 2014-11-11 NOTE — Telephone Encounter (Signed)
Pt sleeping. Wife states that he has been vomiting today due to "eating bad food. My husband has been sleeping all afternoon. I think he ate something bad. I will give him the message that he has these appointments."

## 2014-11-12 ENCOUNTER — Encounter
Admission: RE | Admit: 2014-11-12 | Discharge: 2014-11-12 | Disposition: A | Discharge status: Home / Self Care | Payer: Medicare Other | Source: Ambulatory Visit | Attending: Cardiothoracic Surgery | Admitting: Cardiothoracic Surgery

## 2014-11-12 ENCOUNTER — Telehealth: Payer: Self-pay

## 2014-11-12 DIAGNOSIS — J849 Interstitial pulmonary disease, unspecified: Secondary | ICD-10-CM | POA: Insufficient documentation

## 2014-11-12 NOTE — Telephone Encounter (Signed)
Pre-operative cardiovascular examination - [name], MD at 11/05/2014 5:33 PM     Status: Written Related Problem: Pre-operative cardiovascular examination   Expand All Collapse All   The patient has known history of coronary artery disease with previous stenting most recently in 2008. He has been on dual antiplatelets therapy since then. He has no symptoms suggestive of angina and has reasonable functional capacity. EKG is unremarkable. He had a stress test done within the last year which was normal. Thus, no further cardiac workup is recommended. The patient can proceed at an overall low risk from a cardiac standpoint. Aspirin and Plavix can be held one week before biopsy and should be resumed as soon as safe from a bleeding standpoint.

## 2014-11-12 NOTE — Patient Instructions (Signed)
  Your procedure is scheduled on:11/18/14 Report to Day Surgery. To find out your arrival time please call [phone] between 1PM - 3PM on 11/17/14.  Remember: Instructions that are not followed completely may result in serious medical risk, up to and including death, or upon the discretion of your surgeon and anesthesiologist your surgery may need to be rescheduled.    _x___ 1. Do not eat food or drink liquids after midnight. No gum chewing or hard candies.     __x__ 2. No Alcohol for 24 hours before or after surgery.   ____ 3. Bring all medications with you on the day of surgery if instructed.    __x__ 4. Notify your doctor if there is any change in your medical condition     (cold, fever, infections).     Do not wear jewelry, make-up, hairpins, clips or nail polish.  Do not wear lotions, powders, or perfumes. You may wear deodorant.  Do not shave 48 hours prior to surgery. Men may shave face and neck.  Do not bring valuables to the hospital.    [facility] is not responsible for any belongings or valuables.               Contacts, dentures or bridgework may not be worn into surgery.  Leave your suitcase in the car. After surgery it may be brought to your room.  For patients admitted to the hospital, discharge time is determined by your                treatment team.   Patients discharged the day of surgery will not be allowed to drive home.   Please read over the following fact sheets that you were given:   Surgical Site Infection Prevention   ____ Take these medicines the morning of surgery with A SIP OF WATER:    1. lotrel  2. fenofibrate  3.   4.  5.  6.  ____ Fleet Enema (as directed)   _x___ Use CHG Soap as directed  _x___ Use inhalers on the day of surgery      Do nebulizer treatment and bring inhaler  ____ Stop metformin 2 days prior to surgery    ____ Take 1/2 of usual insulin dose the night before surgery and none on the morning of surgery.   _x___ Stop  Coumadin/Plavix/aspirin on     plavix and aspirin stopped ____ Stop Anti-inflammatories on  ____ Stop supplements until after surgery.    ____ Bring C-Pap to the hospital.

## 2014-11-12 NOTE — Telephone Encounter (Signed)
Anesthesia called, states that 11/05/2014 note is not complete. Not sure if pt is cleared for surgery Please call.

## 2014-11-13 ENCOUNTER — Other Ambulatory Visit: Payer: Medicare Other

## 2014-11-17 NOTE — OR Nursing (Signed)
Note regarding cardiac clearance under Rhea Belton Rn dated  11/12/14 patient is low cardiac risk for upcoming surgery with Dr. Thelma Barge

## 2014-11-18 ENCOUNTER — Encounter: Payer: Self-pay | Admitting: *Deleted

## 2014-11-18 ENCOUNTER — Encounter
Admission: RE | Disposition: A | Discharge status: Home / Self Care | Payer: Self-pay | Source: Ambulatory Visit | Attending: Cardiothoracic Surgery

## 2014-11-18 ENCOUNTER — Ambulatory Visit: Payer: Medicare Other

## 2014-11-18 ENCOUNTER — Ambulatory Visit: Payer: Medicare Other | Admitting: Anesthesiology

## 2014-11-18 ENCOUNTER — Inpatient Hospital Stay
Admission: RE | Admit: 2014-11-18 | Discharge: 2014-11-22 | DRG: 168 | Disposition: A | Discharge status: Home / Self Care | Payer: Medicare Other | Source: Ambulatory Visit | Attending: Cardiothoracic Surgery | Admitting: Cardiothoracic Surgery

## 2014-11-18 DIAGNOSIS — Z09 Encounter for follow-up examination after completed treatment for conditions other than malignant neoplasm: Secondary | ICD-10-CM

## 2014-11-18 DIAGNOSIS — J939 Pneumothorax, unspecified: Secondary | ICD-10-CM

## 2014-11-18 DIAGNOSIS — I2510 Atherosclerotic heart disease of native coronary artery without angina pectoris: Secondary | ICD-10-CM | POA: Diagnosis present

## 2014-11-18 DIAGNOSIS — J84112 Idiopathic pulmonary fibrosis: Principal | ICD-10-CM | POA: Diagnosis present

## 2014-11-18 DIAGNOSIS — Z8249 Family history of ischemic heart disease and other diseases of the circulatory system: Secondary | ICD-10-CM | POA: Diagnosis not present

## 2014-11-18 DIAGNOSIS — Z87891 Personal history of nicotine dependence: Secondary | ICD-10-CM

## 2014-11-18 DIAGNOSIS — K219 Gastro-esophageal reflux disease without esophagitis: Secondary | ICD-10-CM | POA: Diagnosis present

## 2014-11-18 DIAGNOSIS — J849 Interstitial pulmonary disease, unspecified: Secondary | ICD-10-CM | POA: Diagnosis present

## 2014-11-18 DIAGNOSIS — Z955 Presence of coronary angioplasty implant and graft: Secondary | ICD-10-CM | POA: Diagnosis not present

## 2014-11-18 DIAGNOSIS — Z7982 Long term (current) use of aspirin: Secondary | ICD-10-CM

## 2014-11-18 DIAGNOSIS — J8410 Pulmonary fibrosis, unspecified: Secondary | ICD-10-CM | POA: Diagnosis present

## 2014-11-18 DIAGNOSIS — Z7902 Long term (current) use of antithrombotics/antiplatelets: Secondary | ICD-10-CM | POA: Diagnosis not present

## 2014-11-18 HISTORY — DX: Essential (primary) hypertension: I10

## 2014-11-18 HISTORY — DX: Cardiac murmur, unspecified: R01.1

## 2014-11-18 HISTORY — PX: VIDEO ASSISTED THORACOSCOPY (VATS)/THOROCOTOMY: SHX6173

## 2014-11-18 HISTORY — DX: Chronic obstructive pulmonary disease, unspecified: J44.9

## 2014-11-18 LAB — MRSA PCR SCREENING: MRSA by PCR: NEGATIVE

## 2014-11-18 LAB — TYPE AND SCREEN
ABO/RH(D): O NEG
ANTIBODY SCREEN: NEGATIVE

## 2014-11-18 LAB — PROTIME-INR
INR: 1.04
PROTHROMBIN TIME: 13.8 s (ref 11.4–15.0)

## 2014-11-18 LAB — ABO/RH: ABO/RH(D): O NEG

## 2014-11-18 SURGERY — VIDEO ASSISTED THORACOSCOPY (VATS)/THOROCOTOMY
Anesthesia: General | Laterality: Right | Wound class: Clean

## 2014-11-18 MED ORDER — FAMOTIDINE 20 MG PO TABS
ORAL_TABLET | ORAL | Status: AC
  Filled 2014-11-18: qty 1

## 2014-11-18 MED ORDER — GLYCOPYRROLATE 0.2 MG/ML IJ SOLN
INTRAMUSCULAR | Status: DC | PRN
  Administered 2014-11-18: 0.6 mg via INTRAVENOUS
  Administered 2014-11-18: 0.2 mg via INTRAVENOUS

## 2014-11-18 MED ORDER — LACTATED RINGERS IV SOLN
INTRAVENOUS | Status: DC
  Administered 2014-11-18: 13:00:00 via INTRAVENOUS
  Administered 2014-11-18: 75 mL/h via INTRAVENOUS

## 2014-11-18 MED ORDER — CEFAZOLIN SODIUM 1-5 GM-% IV SOLN
1.0000 g | Freq: Three times a day (TID) | INTRAVENOUS | Status: AC
  Administered 2014-11-18 – 2014-11-20 (×6): 1 g via INTRAVENOUS
  Filled 2014-11-18 (×8): qty 50

## 2014-11-18 MED ORDER — PROPOFOL 10 MG/ML IV BOLUS
INTRAVENOUS | Status: DC | PRN
  Administered 2014-11-18: 150 mg via INTRAVENOUS

## 2014-11-18 MED ORDER — OXYCODONE HCL 5 MG PO TABS
5.0000 mg | ORAL_TABLET | ORAL | Status: DC | PRN
  Administered 2014-11-18: 10 mg via ORAL
  Administered 2014-11-19: 5 mg via ORAL
  Administered 2014-11-19: 10 mg via ORAL
  Administered 2014-11-19 (×2): 5 mg via ORAL
  Administered 2014-11-20 – 2014-11-22 (×6): 10 mg via ORAL
  Filled 2014-11-18 (×7): qty 2
  Filled 2014-11-18: qty 10
  Filled 2014-11-18: qty 1
  Filled 2014-11-18: qty 2
  Filled 2014-11-18: qty 1

## 2014-11-18 MED ORDER — PHENYLEPHRINE HCL 10 MG/ML IJ SOLN
INTRAMUSCULAR | Status: DC | PRN
  Administered 2014-11-18 (×2): 100 ug via INTRAVENOUS
  Administered 2014-11-18: 50 ug via INTRAVENOUS
  Administered 2014-11-18: 100 ug via INTRAVENOUS

## 2014-11-18 MED ORDER — ALBUTEROL SULFATE (2.5 MG/3ML) 0.083% IN NEBU
2.5000 mg | INHALATION_SOLUTION | RESPIRATORY_TRACT | Status: DC
  Administered 2014-11-18 – 2014-11-22 (×17): 2.5 mg via RESPIRATORY_TRACT
  Filled 2014-11-18 (×16): qty 3

## 2014-11-18 MED ORDER — BENAZEPRIL HCL 20 MG PO TABS
10.0000 mg | ORAL_TABLET | Freq: Every day | ORAL | Status: DC
  Administered 2014-11-19 – 2014-11-22 (×4): 10 mg via ORAL
  Filled 2014-11-18: qty 2
  Filled 2014-11-18 (×2): qty 1
  Filled 2014-11-18: qty 2

## 2014-11-18 MED ORDER — BUPIVACAINE HCL (PF) 0.25 % IJ SOLN
INTRAMUSCULAR | Status: DC | PRN
  Administered 2014-11-18: 7 mL

## 2014-11-18 MED ORDER — ROSUVASTATIN CALCIUM 10 MG PO TABS
10.0000 mg | ORAL_TABLET | Freq: Every day | ORAL | Status: DC
  Administered 2014-11-18 – 2014-11-22 (×5): 10 mg via ORAL
  Filled 2014-11-18 (×5): qty 1

## 2014-11-18 MED ORDER — BUPIVACAINE HCL (PF) 0.25 % IJ SOLN
INTRAMUSCULAR | Status: AC
  Filled 2014-11-18: qty 30

## 2014-11-18 MED ORDER — ONDANSETRON HCL 4 MG/2ML IJ SOLN: 4.0000 mg | Freq: Four times a day (QID) | INTRAMUSCULAR | Status: DC | PRN

## 2014-11-18 MED ORDER — NEOSTIGMINE METHYLSULFATE 10 MG/10ML IV SOLN
INTRAVENOUS | Status: DC | PRN
  Administered 2014-11-18: 4 mg via INTRAVENOUS

## 2014-11-18 MED ORDER — FENOFIBRIC ACID 135 MG PO CPDR
1.0000 | DELAYED_RELEASE_CAPSULE | Freq: Every day | ORAL | Status: DC
  Filled 2014-11-18 (×3): qty 1

## 2014-11-18 MED ORDER — MORPHINE SULFATE 2 MG/ML IJ SOLN
2.0000 mg | INTRAMUSCULAR | Status: DC | PRN
  Administered 2014-11-18 – 2014-11-19 (×5): 2 mg via INTRAVENOUS
  Filled 2014-11-18 (×5): qty 1

## 2014-11-18 MED ORDER — METOPROLOL TARTRATE 1 MG/ML IV SOLN
INTRAVENOUS | Status: DC | PRN
  Administered 2014-11-18: 3 mg via INTRAVENOUS

## 2014-11-18 MED ORDER — ALBUTEROL SULFATE HFA 108 (90 BASE) MCG/ACT IN AERS: 2.0000 | INHALATION_SPRAY | Freq: Four times a day (QID) | RESPIRATORY_TRACT | Status: DC | PRN

## 2014-11-18 MED ORDER — MORPHINE SULFATE 2 MG/ML IJ SOLN
INTRAMUSCULAR | Status: AC
  Administered 2014-11-18: 2 mg via INTRAVENOUS
  Filled 2014-11-18: qty 1

## 2014-11-18 MED ORDER — HYDROMORPHONE HCL 1 MG/ML IJ SOLN
0.2500 mg | INTRAMUSCULAR | Status: DC | PRN
  Administered 2014-11-18 (×2): 0.5 mg via INTRAVENOUS

## 2014-11-18 MED ORDER — HYDROMORPHONE HCL 1 MG/ML IJ SOLN
INTRAMUSCULAR | Status: AC
  Administered 2014-11-18: 0.5 mg via INTRAVENOUS
  Filled 2014-11-18: qty 1

## 2014-11-18 MED ORDER — ACETAMINOPHEN 10 MG/ML IV SOLN
INTRAVENOUS | Status: AC
  Filled 2014-11-18: qty 100

## 2014-11-18 MED ORDER — CEFAZOLIN SODIUM-DEXTROSE 2-3 GM-% IV SOLR
INTRAVENOUS | Status: AC
  Filled 2014-11-18: qty 50

## 2014-11-18 MED ORDER — DEXTROSE-NACL 5-0.45 % IV SOLN
INTRAVENOUS | Status: DC
  Administered 2014-11-18 – 2014-11-19 (×2): via INTRAVENOUS

## 2014-11-18 MED ORDER — KETAMINE HCL 50 MG/ML IJ SOLN
INTRAMUSCULAR | Status: DC | PRN
  Administered 2014-11-18: 50 mg via INTRAMUSCULAR

## 2014-11-18 MED ORDER — ALBUTEROL SULFATE (2.5 MG/3ML) 0.083% IN NEBU
INHALATION_SOLUTION | RESPIRATORY_TRACT | Status: AC
  Administered 2014-11-18: 2.5 mg via RESPIRATORY_TRACT
  Filled 2014-11-18: qty 3

## 2014-11-18 MED ORDER — FENTANYL CITRATE (PF) 100 MCG/2ML IJ SOLN
INTRAMUSCULAR | Status: DC | PRN
Start: 2014-11-18 — End: 2014-11-18
  Administered 2014-11-18 (×2): 250 ug via INTRAVENOUS

## 2014-11-18 MED ORDER — CEFAZOLIN SODIUM-DEXTROSE 2-3 GM-% IV SOLR
INTRAVENOUS | Status: DC | PRN
  Administered 2014-11-18: 2 g via INTRAVENOUS

## 2014-11-18 MED ORDER — ONDANSETRON HCL 4 MG/2ML IJ SOLN
INTRAMUSCULAR | Status: DC | PRN
  Administered 2014-11-18: 4 mg via INTRAVENOUS

## 2014-11-18 MED ORDER — MINERAL OIL LIGHT 100 % EX OIL
TOPICAL_OIL | CUTANEOUS | Status: AC
  Filled 2014-11-18: qty 25

## 2014-11-18 MED ORDER — AMLODIPINE BESYLATE 5 MG PO TABS
5.0000 mg | ORAL_TABLET | Freq: Every day | ORAL | Status: DC
  Administered 2014-11-18 – 2014-11-22 (×5): 5 mg via ORAL
  Filled 2014-11-18 (×5): qty 1

## 2014-11-18 MED ORDER — ALBUTEROL SULFATE (2.5 MG/3ML) 0.083% IN NEBU: 2.5000 mg | INHALATION_SOLUTION | RESPIRATORY_TRACT | Status: DC

## 2014-11-18 MED ORDER — STERILE WATER FOR IRRIGATION IR SOLN
Status: DC | PRN
  Administered 2014-11-18: 650 mL

## 2014-11-18 MED ORDER — ACETAMINOPHEN 10 MG/ML IV SOLN
INTRAVENOUS | Status: DC | PRN
  Administered 2014-11-18: 1000 mg via INTRAVENOUS

## 2014-11-18 MED ORDER — ONDANSETRON HCL 4 MG/2ML IJ SOLN: 4.0000 mg | Freq: Once | INTRAMUSCULAR | Status: DC | PRN

## 2014-11-18 MED ORDER — FAMOTIDINE 20 MG PO TABS
20.0000 mg | ORAL_TABLET | Freq: Once | ORAL | Status: AC
  Administered 2014-11-18: 20 mg via ORAL

## 2014-11-18 MED ORDER — LIDOCAINE HCL (PF) 4 % IJ SOLN
INTRAMUSCULAR | Status: DC | PRN
  Administered 2014-11-18: 200 mg

## 2014-11-18 MED ORDER — AMLODIPINE BESY-BENAZEPRIL HCL 5-10 MG PO CAPS: 1.0000 | ORAL_CAPSULE | Freq: Every day | ORAL | Status: DC

## 2014-11-18 MED ORDER — BISACODYL 5 MG PO TBEC
10.0000 mg | DELAYED_RELEASE_TABLET | Freq: Every day | ORAL | Status: DC
  Administered 2014-11-18 – 2014-11-21 (×4): 10 mg via ORAL
  Filled 2014-11-18 (×5): qty 2

## 2014-11-18 MED ORDER — EPHEDRINE SULFATE 50 MG/ML IJ SOLN
INTRAMUSCULAR | Status: DC | PRN
  Administered 2014-11-18: 15 mg via INTRAVENOUS

## 2014-11-18 MED ORDER — ROCURONIUM BROMIDE 100 MG/10ML IV SOLN
INTRAVENOUS | Status: DC | PRN
  Administered 2014-11-18: 30 mg via INTRAVENOUS
  Administered 2014-11-18 (×2): 10 mg via INTRAVENOUS

## 2014-11-18 SURGICAL SUPPLY — 52 items
ATOMIZER TRACHEAL (MISCELLANEOUS) ×1 IMPLANT
BNDG COHESIVE 4X5 TAN STRL (GAUZE/BANDAGES/DRESSINGS) IMPLANT
BRONCHOSCOPE PED SLIM DISP (MISCELLANEOUS) ×2 IMPLANT
CATH THOR STR 28F  SOFT WA (CATHETERS) ×1
CATH THOR STR 28F SOFT WA (CATHETERS) ×1 IMPLANT
CHLORAPREP W/TINT 26ML (MISCELLANEOUS) ×4 IMPLANT
CNTNR SPEC 2.5X3XGRAD LEK (MISCELLANEOUS) ×2
CONT SPEC 4OZ STER OR WHT (MISCELLANEOUS) ×2
CONT SPEC 4OZ STRL OR WHT (MISCELLANEOUS) ×2
CONTAINER SPEC 2.5X3XGRAD LEK (MISCELLANEOUS) IMPLANT
CUTTER ECHEON FLEX ENDO 45 340 (ENDOMECHANICALS) ×2 IMPLANT
DEFOGGER SCOPE WARMER CLEARIFY (MISCELLANEOUS) ×2 IMPLANT
DRAIN CHEST DRY SUCT SGL (MISCELLANEOUS) ×2 IMPLANT
DRAPE C-SECTION (MISCELLANEOUS) ×2 IMPLANT
DRESSING TELFA 4X3 1S ST N-ADH (GAUZE/BANDAGES/DRESSINGS) ×2 IMPLANT
DRSG TEGADERM 2-3/8X2-3/4 SM (GAUZE/BANDAGES/DRESSINGS) ×4 IMPLANT
ELECT BLADE 6 FLAT ULTRCLN (ELECTRODE) ×1 IMPLANT
ELECT CAUTERY BLADE TIP 2.5 (TIP) ×2
ELECTRODE CAUTERY BLDE TIP 2.5 (TIP) ×1 IMPLANT
GAUZE SPONGE 4X4 12PLY STRL (GAUZE/BANDAGES/DRESSINGS) ×2 IMPLANT
GLOVE EXAM LX STRL 7.5 (GLOVE) ×16 IMPLANT
GOWN STRL REUS W/ TWL LRG LVL3 (GOWN DISPOSABLE) ×4 IMPLANT
GOWN STRL REUS W/TWL LRG LVL3 (GOWN DISPOSABLE) ×8
HANDLE YANKAUER SUCT BULB TIP (MISCELLANEOUS) ×2 IMPLANT
KIT RM TURNOVER STRD PROC AR (KITS) ×2 IMPLANT
LABEL OR SOLS (LABEL) ×2 IMPLANT
NDL SPNL 20GX3.5 QUINCKE YW (NEEDLE) ×1 IMPLANT
NEEDLE SPNL 20GX3.5 QUINCKE YW (NEEDLE) ×2 IMPLANT
NS IRRIG 500ML POUR BTL (IV SOLUTION) ×2 IMPLANT
PACK BASIN MAJOR ARMC (MISCELLANEOUS) ×2 IMPLANT
PAD GROUND ADULT SPLIT (MISCELLANEOUS) ×2 IMPLANT
RELOAD GOLD ECHELON 45 (STAPLE) ×8 IMPLANT
SCISSORS METZENBAUM CVD 33 (INSTRUMENTS) ×2 IMPLANT
SPONGE KITTNER 5P (MISCELLANEOUS) ×3 IMPLANT
STAPLE ECHEON FLEX 60 POW ENDO (STAPLE) ×1 IMPLANT
STAPLER VASCULAR ECHELON 35 (CUTTER) IMPLANT
SUT ETHILON 4-0 (SUTURE) ×4
SUT ETHILON 4-0 FS2 18XMFL BLK (SUTURE) ×2
SUT SILK 1 SH (SUTURE) ×8 IMPLANT
SUT VIC AB 0 CT1 36 (SUTURE) ×4 IMPLANT
SUT VIC AB 2-0 CT1 27 (SUTURE)
SUT VIC AB 2-0 CT1 TAPERPNT 27 (SUTURE) IMPLANT
SUT VIC AB 2-0 CT2 27 (SUTURE) ×4 IMPLANT
SUT VIC AB 4-0 FS2 27 (SUTURE) ×2 IMPLANT
SUT VICRYL 2 TP 1 (SUTURE) ×4 IMPLANT
SUTURE ETHLN 4-0 FS2 18XMF BLK (SUTURE) ×2 IMPLANT
SYR BULB IRRIG 60ML STRL (SYRINGE) ×2 IMPLANT
TAPE ADH 3 LX (MISCELLANEOUS) ×2 IMPLANT
TROCAR FLEXIPATH 20X80 (ENDOMECHANICALS) ×2 IMPLANT
TROCAR FLEXIPATH THORACIC 15MM (ENDOMECHANICALS) IMPLANT
WATER STERILE IRR 1000ML POUR (IV SOLUTION) ×2 IMPLANT
YANKAUER SUCT BULB TIP FLEX NO (MISCELLANEOUS) ×2 IMPLANT

## 2014-11-18 NOTE — H&P (Signed)
Updated history and physical done on 6.22.16 at 1130 hours.  No changes.  Reviewed surgical procedure again.  All questions answered.

## 2014-11-18 NOTE — Progress Notes (Signed)
Pt is resting more comfortably at this time. Will continue to reassess.

## 2014-11-18 NOTE — Progress Notes (Signed)
Called dr Thelma Barge to see if he wanted to order any labs before AM labs, he stated he didn't want labs ordered at this time.

## 2014-11-18 NOTE — Transfer of Care (Signed)
Immediate Anesthesia Transfer of Care Note  Patient: Shane Reid.  Procedure(s) Performed: Procedure(s): VIDEO ASSISTED THORACOSCOPY (VATS)/ wedge resection biopsy (Right)  Patient Location: PACU  Anesthesia Type:General  Level of Consciousness: sedated  Airway & Oxygen Therapy: Patient Spontanous Breathing and Patient connected to face mask oxygen  Post-op Assessment: Report given to RN and Post -op Vital signs reviewed and stable  Post vital signs: Reviewed and stable  Last Vitals:  Filed Vitals:   11/18/14 1408  BP: 129/74  Pulse:   Temp: 36.2 C  Resp: 14    Complications: No apparent anesthesia complications

## 2014-11-18 NOTE — Progress Notes (Signed)
Dr rice aware of heart rate 43 blood pressure goood

## 2014-11-18 NOTE — Anesthesia Preprocedure Evaluation (Signed)
Anesthesia Evaluation  Patient identified by MRN, date of birth, ID band Patient awake    Reviewed: Allergy & Precautions, NPO status , Patient's Chart, lab work & pertinent test results  Airway Mallampati: I  TM Distance: >3 FB Neck ROM: Limited    Dental  (+) Edentulous Upper, Edentulous Lower   Pulmonary shortness of breath, with exertion and at rest, asthma , former smoker,  May have interstitial lung ds. Very SOB both at rest and with activity. breath sounds clear to auscultation  + decreased breath sounds      Cardiovascular Exercise Tolerance: Poor + angina + CAD and + Cardiac Stents Rhythm:Regular Rate:Normal  4 cardiac stents, plavix and ASA both held for 7 days, rarely uses NTG, does not go up stairs.   Neuro/Psych    GI/Hepatic   Endo/Other    Renal/GU      Musculoskeletal   Abdominal (+) + scaphoid  Abdomen: soft.    Peds  Hematology   Anesthesia Other Findings   Reproductive/Obstetrics                             Anesthesia Physical Anesthesia Plan  ASA: IV  Anesthesia Plan: General   Post-op Pain Management:    Induction: Intravenous  Airway Management Planned: Oral ETT  Additional Equipment:   Intra-op Plan:   Post-operative Plan: Extubation in OR  Informed Consent: I have reviewed the patients History and Physical, chart, labs and discussed the procedure including the risks, benefits and alternatives for the proposed anesthesia with the patient or authorized representative who has indicated his/her understanding and acceptance.     Plan Discussed with: CRNA  Anesthesia Plan Comments:         Anesthesia Quick Evaluation

## 2014-11-18 NOTE — Anesthesia Postprocedure Evaluation (Signed)
  Anesthesia Post-op Note  Patient: Shane Reid.  Procedure(s) Performed: Procedure(s): VIDEO ASSISTED THORACOSCOPY (VATS)/ wedge resection biopsy (Right)  Anesthesia type:General  Patient location: PACU  Post pain: Pain level controlled  Post assessment: Post-op Vital signs reviewed, Patient's Cardiovascular Status Stable, Respiratory Function Stable, Patent Airway and No signs of Nausea or vomiting  Post vital signs: Reviewed and stable  Last Vitals:  Filed Vitals:   11/18/14 1408  BP: 129/74  Pulse:   Temp: 36.2 C  Resp: 14    Level of consciousness: awake, alert  and patient cooperative  Complications: No apparent anesthesia complications

## 2014-11-18 NOTE — Op Note (Signed)
11/18/2014  5:04 PM  PATIENT:  Shane Reid.  69 y.o. male  PRE-OPERATIVE DIAGNOSIS:  INTERSTITIAL LUNG DISEASE  POST-OPERATIVE DIAGNOSIS:  INTERSTITIAL LUNG DISEASE  PROCEDURE:  Procedure(s): VIDEO ASSISTED THORACOSCOPY (VATS)/ wedge resection biopsy (Right)   Preoperative Diagnosis - Pulmonary fibrosis  Postoperative Diagnosis - Same  Operation Performed - 1.  Preoperative bronchoscopy to assess endobronchial anatomy  2.  Right thoracoscopy with biopsy of right lower lobe  SURGEON:  Surgeon(s) and Role:    * Hulda Marin, MD - Primary  ASSISTANTS: None   ANESTHESIA: Gen.  INDICATIONS FOR PROCEDURE this patient is a 69 year old white male with a history of progressive shortness of breath and a CT scan consistent with interstitial lung disease. Because there is the potential for possible therapy he was offered an open lung biopsy for definitive diagnosis and possible treatment. Indications and risks of the procedure were explained to the patient gave his informed consent.  DICTATION: The patient was brought to the operating suite and placed in the supine position. Gen. endotracheal anesthesia was given with a double-lumen tube. Preoperative bronchoscopy was carried out. I was particularly interested in the tracheal right upper lobe bronchus which is a normal variant however there also appeared to be an area at the very top of the trachea that might be a small diverticulum. However during the bronchoscopy the endotracheal tube cuff obscured the most proximal portion of the trachea but I was able to visualize the tracheal bronchus. Remaining portion of the airway showed some thin mucoid secretions but there is no evidence of tumor.  The patient was positioned for right thoracoscopy. All pressure points were carefully padded. Patient was prepped and draped in usual sterile fashion. We began by making a single incision to accommodate a 15 mm port in the anterior axillary line at about  the fourth interspace. The chest was entered. We could then place a scope and made 2 additional ports 1 a small puncture site posteriorly and another 15 mm port site inferiorly. We then transferred the camera to the most inferior port and then use the anterior port for our stapler. A generous portion of the right lower lobe was resected using an endoscopic stapler. The staple line was excised from the specimen and sent for appropriate cultures. The remaining portion of the lung was sent for histologic examination.  The gross appearance of the lungs was consistent with pulmonary fibrosis. There were small cystic areas in the subpleural space upon cut sectioning.  Hemostasis was complete throughout all our port sites and a single 28 chest tube was inserted through the anterior incision and brought out through a separate stab wound. All the wounds were then closed with running absorbable sutures. Skin was closed with interrupted nylon. The chest tube was secured with silk. Sterile dressings were applied. The patient was then next extubated and taken to the recovery room in stable condition.

## 2014-11-18 NOTE — Progress Notes (Signed)
Pt is currently in pain, 2 mg morphine administered, pain wasn't relieved, 10 mg immediate release oxycodone administered, if no relief will notify Dr. Thelma Barge.
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
## 2014-11-18 NOTE — Progress Notes (Signed)
Nasal airway removed at 1439  Scant amt of blood

## 2014-11-18 NOTE — Brief Op Note (Signed)
11/18/2014  2:24 PM  PATIENT:  Shane Reid.  69 y.o. male  PRE-OPERATIVE DIAGNOSIS:  INTERSTITIAL LUNG DISEASE  POST-OPERATIVE DIAGNOSIS:  INTERSTITIAL LUNG DISEASE  PROCEDURE:  Procedure(s): VIDEO ASSISTED THORACOSCOPY (VATS)/ wedge resection biopsy (Right)  SURGEON:  Surgeon(s) and Role:    * Hulda Marin, MD - Primary  PHYSICIAN ASSISTANT:   ASSISTANTS: none  ANESTHESIA:   general  EBL:  Total I/O In: 1150 [I.V.:1150] Out: 210 [Urine:200; Blood:10]  BLOOD ADMINISTERED:none  DRAINS: 1 Chest Tube(s) in the pleural space   LOCAL MEDICATIONS USED:  MARCAINE    and BUPIVICAINE   SPECIMEN:  No Specimen  DISPOSITION OF SPECIMEN:  PATHOLOGY  COUNTS:  YES  TOURNIQUET:  * No tourniquets in log *  DICTATION: .Dragon Dictation  PLAN OF CARE: Admit to inpatient   PATIENT DISPOSITION:  PACU - hemodynamically stable.   Delay start of Pharmacological VTE agent (>24hrs) due to surgical blood loss or risk of bleeding: yes

## 2014-11-19 LAB — BASIC METABOLIC PANEL
ANION GAP: 6 (ref 5–15)
BUN: 10 mg/dL (ref 6–20)
CALCIUM: 8.5 mg/dL — AB (ref 8.9–10.3)
CO2: 26 mmol/L (ref 22–32)
Chloride: 104 mmol/L (ref 101–111)
Creatinine, Ser: 1.23 mg/dL (ref 0.61–1.24)
GFR calc non Af Amer: 58 mL/min — ABNORMAL LOW (ref >60)
Glucose, Bld: 207 mg/dL — ABNORMAL HIGH (ref 65–99)
Potassium: 3.9 mmol/L (ref 3.5–5.1)
SODIUM: 136 mmol/L (ref 135–145)

## 2014-11-19 LAB — CBC
HEMATOCRIT: 40.6 % (ref 40.0–52.0)
HEMOGLOBIN: 13.3 g/dL (ref 13.0–18.0)
MCH: 28.4 pg (ref 26.0–34.0)
MCHC: 32.8 g/dL (ref 32.0–36.0)
MCV: 86.7 fL (ref 80.0–100.0)
Platelets: 193 10*3/uL (ref 150–440)
RBC: 4.68 MIL/uL (ref 4.40–5.90)
RDW: 14 % (ref 11.5–14.5)
WBC: 12.5 10*3/uL — ABNORMAL HIGH (ref 3.8–10.6)

## 2014-11-19 MED ORDER — HYDROCODONE-ACETAMINOPHEN 5-325 MG PO TABS
1.0000 | ORAL_TABLET | ORAL | Status: DC | PRN
  Administered 2014-11-19 – 2014-11-21 (×7): 2 via ORAL
  Filled 2014-11-19 (×7): qty 2

## 2014-11-19 NOTE — Progress Notes (Signed)
Patient ID: Shane Reid., male   DOB: Dec 11, 1945, 69 y.o.   MRN: [id number]  HISTORY: He states that his pain has been under reasonably good control. He is not short of breath.   PERTINENT REVIEW OF SYSTEMS: No shortness of breath  Filed Vitals:   11/19/14 0713  BP:   Pulse: 59  Temp:   Resp: 23   Wt Readings from Last 3 Encounters:  11/18/14 77.111 kg (170 lb)  11/05/14 77.452 kg (170 lb 12 oz)  11/02/14 76.658 kg (169 lb)    EXAM:  Resp: Lungs are clear bilaterally.  No respiratory distress, normal effort. Heart:  Regular without murmurs  No air leak seen.  Dressings are clean and dry.     ASSESSMENT: Doing well postoperative day #1 following right thoracoscopy.   PLAN:   We will transfer the patient to the floor. We will advance his diet. We will discontinue his intravenous fluids.    Hulda Marin, MD

## 2014-11-19 NOTE — Progress Notes (Signed)
Report called to Shane Spare, RN informed pt alert and oriented has right midaxillary chest tube in place with dressing having old dried drainage; vss; foley removed pt has not voided; c/o pain administered pain medication will monitor for s/s of pain; sinus rhythm/sinus bradycardia will continue to monitor and assess pt

## 2014-11-19 NOTE — Progress Notes (Signed)
Pt is alert and oriented. inj morphine  Given x2 for pain. VSS.Chest tube site dry and intact.draining serosa. Fluid.site looks good. .foley in place. Lung sound diminished.sao2 97%. Resting comfortably in bed without any distress. Continue to observeclosely.

## 2014-11-20 ENCOUNTER — Inpatient Hospital Stay: Payer: Medicare Other

## 2014-11-20 MED ORDER — FENOFIBRATE 145 MG PO TABS
145.0000 mg | ORAL_TABLET | Freq: Every day | ORAL | Status: DC
  Administered 2014-11-20 – 2014-11-22 (×3): 145 mg via ORAL
  Filled 2014-11-20 (×3): qty 1

## 2014-11-20 NOTE — Progress Notes (Signed)
[facility] Post-Op Note  Patient ID: Shane Reid., male   DOB: 1946-03-25, 69 y.o.   MRN: [id number]  HISTORY: Did well.  Ambulating in halls without issue.  Did desaturate to less than 90 with exertion   Filed Vitals:   11/20/14 0820  BP: 121/76  Pulse: 72  Temp: 98.4 F (36.9 C)  Resp:      EXAM: Resp: Lungs are clear bilaterally.  No respiratory distress, normal effort. Heart:  Regular without murmurs  No air leak seen    ASSESSMENT: CXRay today shows small apical pneumothorax  Path reviewed with Dr. Oneita Kras.  Most consistent with IPF.   PLAN:   Chest tube to suction for today and water seal in the morning.     Hulda Marin, MD

## 2014-11-20 NOTE — Care Management (Signed)
Met with patient to discuss discharge planning. He wanted to go home today but per patient "stay extended due to xray". He lives with his ex-wife and she transports him to appointments when needed. O2 is new. Home health is okay but he wasn't excited about the offer if needed. He goes to [facility] for PCP. He uses Engineer, structural for Rx.

## 2014-11-21 ENCOUNTER — Inpatient Hospital Stay: Payer: Medicare Other

## 2014-11-21 NOTE — Progress Notes (Signed)
Surgery Progress Note  S: Feels good O: AF/VSS, good uop GEN: NAD/A&Ox3 RESP: good cough, no air leak  CXR: no appreciable pneumo on my read of CXR  A/P 69 yo s/p vats wedge, doing well - f/u cxr read - pain control

## 2014-11-21 NOTE — Progress Notes (Signed)
Pt has been uncomfortable all day long.  He has been hot and cold.  Sweating at time.  Thermostat has been adjusted many times by myself and nurse aide.  Fresh linens given.  Temperature checked. Door closed.  Pt is very unhappy with situation.  He states he cant keep going on like this.  Says he may call patient relations or have [place] call the doctor to come take the tube out of his side so he can go home.  Charge nurse Marcelino Duster aware that pt is unhappy. For now, door is closed, minor adjustment made to thermostat and blanket pulled up.  Pt states he is cold and sweating at the same time. No other complaints at this time.

## 2014-11-22 ENCOUNTER — Inpatient Hospital Stay: Payer: Medicare Other

## 2014-11-22 LAB — ANAEROBIC CULTURE: Culture: NO GROWTH

## 2014-11-22 MED ORDER — HYDROCODONE-ACETAMINOPHEN 5-325 MG PO TABS: 1.0000 | ORAL_TABLET | ORAL | Status: DC | PRN

## 2014-11-22 NOTE — Progress Notes (Signed)
Chest tube pulled.  No obvious complications.  Will obtain CXR and if lung up will likely be discharged.

## 2014-11-22 NOTE — Progress Notes (Signed)
Surgery Progress Note  S: No acute issues O: AF/VSS, good uop GEN: NAD/A&Ox3 RESP: moving air well, no air leak  A/P 69 yo s/p VATS/wedge, doing well - CXR - possible CT out later, home later

## 2014-11-22 NOTE — Discharge Instructions (Signed)
Do not drive on pain medications Do not lift greater than 15 lbs for a period of 6 weeks Call or return to ER if you develop fever greater than 101.5, nausea/vomiting, shortness of breath, increased pain, redness/drainage from incisions Take bandages off in 48 hours.  Okay to shower with bandages on or after they come off, no tub baths

## 2014-11-22 NOTE — Progress Notes (Signed)
A&O. VSS. Tolerating diet well. No complaints of pain or nausea. Resting comfortably. Chest tube removed per MD. Tolerated well. IV removed per policy. Prescription given to pt. Discharged per MD orders. Discharge instructions reviewed with pt and pt verbalized understanding. Discharged via wheelchair escorted by nursing staff.

## 2014-11-23 ENCOUNTER — Encounter: Payer: Self-pay | Admitting: Cardiothoracic Surgery

## 2014-11-23 LAB — TISSUE CULTURE: Culture: NO GROWTH

## 2014-11-23 NOTE — Progress Notes (Signed)
Physician Discharge Summary  Patient ID: Shane Reid. MRN: [id number] DOB/AGE: 70-29-1947 70 y.o.  Admit date: (Not on file) Discharge date: 11/23/2014   Discharge Diagnoses:  Active Problems:   * No active hospital problems. *   ProceduresRight VATS with lung biopsy  Hospital Course: Underwent a thoracoscopy with VATS of RLL biopsy.  Path consistent with IPF.  He did well in the hospital and had his chest tube removed on POD3 and discharged to home  Disposition: 01-Home or Self Care     Medication List       This list is accurate as of: 11/23/14  3:00 PM.  Always use your most recent med list.               albuterol 108 (90 BASE) MCG/ACT inhaler  Commonly known as:  PROVENTIL HFA;VENTOLIN HFA  Inhale 2 puffs into the lungs every 6 (six) hours as needed for wheezing or shortness of breath.     albuterol (2.5 MG/3ML) 0.083% nebulizer solution  Commonly known as:  PROVENTIL  Take 3 mLs (2.5 mg total) by nebulization every 6 (six) hours as needed.     amLODipine-benazepril 5-10 MG per capsule  Commonly known as:  LOTREL  Take 1 capsule by mouth daily.     aspirin 81 MG tablet  Take 81 mg by mouth daily.     clopidogrel 75 MG tablet  Commonly known as:  PLAVIX  TAKE ONE (1) TABLET BY MOUTH EVERY DAY     Fenofibric Acid 135 MG Cpdr  Take 1 capsule by mouth daily.     HYDROcodone-acetaminophen 5-325 MG per tablet  Commonly known as:  NORCO/VICODIN  Take 1-2 tablets by mouth every 4 (four) hours as needed for moderate pain or severe pain.     multivitamin tablet  Take 1 tablet by mouth daily.     nitroGLYCERIN 0.4 MG SL tablet  Commonly known as:  NITROSTAT  Place 1 tablet (0.4 mg total) under the tongue every 5 (five) minutes as needed.     rosuvastatin 10 MG tablet  Commonly known as:  CRESTOR  TAKE ONE (1) TABLET BY MOUTH EVERY DAY         Hulda Marin

## 2014-11-24 LAB — SURGICAL PATHOLOGY

## 2014-11-26 ENCOUNTER — Ambulatory Visit
Admission: RE | Admit: 2014-11-26 | Discharge: 2014-11-26 | Disposition: A | Discharge status: Home / Self Care | Payer: Medicare Other | Source: Ambulatory Visit | Attending: Cardiothoracic Surgery | Admitting: Cardiothoracic Surgery

## 2014-11-26 ENCOUNTER — Inpatient Hospital Stay: Payer: Medicare Other | Attending: Cardiothoracic Surgery | Admitting: Cardiothoracic Surgery

## 2014-11-26 ENCOUNTER — Encounter: Payer: Self-pay | Admitting: Cardiothoracic Surgery

## 2014-11-26 VITALS — BP 132/89 | HR 69 | Temp 96.5°F | Resp 20 | Wt 160.7 lb

## 2014-11-26 DIAGNOSIS — J849 Interstitial pulmonary disease, unspecified: Secondary | ICD-10-CM

## 2014-11-26 DIAGNOSIS — R918 Other nonspecific abnormal finding of lung field: Secondary | ICD-10-CM

## 2014-11-26 NOTE — Progress Notes (Signed)
[facility] Inpatient Post-Op Note  Patient ID: Shane Reid., male   DOB: [birth year], 69 y.o.   MRN: [id number]  HISTORY: He returns today in follow-up. He states she's not had any significant problems with shortness of breath fevers or chills. He wants to [employer] and uses a Environmental consultant. He feels that he can certainly do that kind of activity now.   Filed Vitals:   11/26/14 1150  BP: 132/89  Pulse: 69  Temp: 96.5 F (35.8 C)  Resp: 20     EXAM: Resp: Lungs are clear bilaterally.  No respiratory distress, normal effort. Heart:  Regular without murmurs Abd:  Abdomen is soft, non distended and non tender. No masses are palpable.  There is no rebound and no guarding.  Neurological: Alert and oriented to person, place, and time. Coordination normal.  Skin: Skin is warm and dry. No rash noted. No diaphoretic. No erythema. No pallor.  Psychiatric: Normal mood and affect. Normal behavior. Judgment and thought content normal.    ASSESSMENT: He did have a chest x-ray made today which looks good. Final pathology was reviewed with Dr. Donnie Coffin as an this was consistent with idiopathic pulmonary fibrosis. I discussed his care today with Dr. Tinnie Gens. We will refer the patient to [facility] for continued observation and then subsequent care back here at [facility].   PLAN:   We will remove all the sutures today. We will Steri-Strip his wound. We will refer the patient back to pulmonary medicine.    Hulda Marin, MD

## 2014-11-26 NOTE — Progress Notes (Signed)
Patient's wife has called multiple times today to request for narcotics & xanax on behalf of her husband. The patient was made aware of these phone calls. He has asked that our clinic not communicate with his wife about his care. Pt does not desire any xanax or narcotic at this visit. The patient will return to the care of his primary medical doctor and his pulmonologist. He does not need to return to any further office visits. His sutures were removed today while in the clinic. Steristrips applied per policy.  Pt discharged from clinic without further complaints.

## 2014-11-28 ENCOUNTER — Encounter: Payer: Self-pay | Admitting: Emergency Medicine

## 2014-11-28 ENCOUNTER — Emergency Department: Payer: Medicare Other

## 2014-11-28 ENCOUNTER — Emergency Department
Admission: EM | Admit: 2014-11-28 | Discharge: 2014-11-28 | Disposition: A | Discharge status: Home / Self Care | Payer: Medicare Other | Attending: Emergency Medicine | Admitting: Emergency Medicine

## 2014-11-28 DIAGNOSIS — Z7902 Long term (current) use of antithrombotics/antiplatelets: Secondary | ICD-10-CM | POA: Diagnosis not present

## 2014-11-28 DIAGNOSIS — I10 Essential (primary) hypertension: Secondary | ICD-10-CM | POA: Diagnosis not present

## 2014-11-28 DIAGNOSIS — Z79899 Other long term (current) drug therapy: Secondary | ICD-10-CM | POA: Diagnosis not present

## 2014-11-28 DIAGNOSIS — G8918 Other acute postprocedural pain: Secondary | ICD-10-CM | POA: Insufficient documentation

## 2014-11-28 DIAGNOSIS — Z87891 Personal history of nicotine dependence: Secondary | ICD-10-CM | POA: Insufficient documentation

## 2014-11-28 DIAGNOSIS — Z7982 Long term (current) use of aspirin: Secondary | ICD-10-CM | POA: Diagnosis not present

## 2014-11-28 DIAGNOSIS — R0789 Other chest pain: Secondary | ICD-10-CM | POA: Insufficient documentation

## 2014-11-28 DIAGNOSIS — R911 Solitary pulmonary nodule: Secondary | ICD-10-CM | POA: Diagnosis present

## 2014-11-28 MED ORDER — OXYCODONE-ACETAMINOPHEN 5-325 MG PO TABS
1.0000 | ORAL_TABLET | Freq: Once | ORAL | Status: AC
  Administered 2014-11-28: 1 via ORAL

## 2014-11-28 MED ORDER — OXYCODONE-ACETAMINOPHEN 5-325 MG PO TABS: 1.0000 | ORAL_TABLET | ORAL | Status: DC | PRN

## 2014-11-28 MED ORDER — OXYCODONE-ACETAMINOPHEN 5-325 MG PO TABS
ORAL_TABLET | ORAL | Status: AC
  Administered 2014-11-28: 1 via ORAL
  Filled 2014-11-28: qty 1
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
## 2014-11-28 NOTE — Discharge Instructions (Signed)
Pain Relief Preoperatively and Postoperatively °Being a good patient does not mean being a silent one. If you have questions, problems, or concerns about the pain you may feel after surgery, let your caregiver know. Patients have the right to assessment and management of pain. The treatment of pain after surgery is important to speed up recovery and return to normal activities. Severe pain after surgery, and the fear or anxiety associated with that pain, may cause extreme discomfort that: °· Prevents sleep. °· Decreases the ability to breathe deeply and cough. This can cause pneumonia or other upper airway infections. °· Causes your heart to beat faster and your blood pressure to be higher. °· Increases the risk for constipation and bloating. °· Decreases the ability of wounds to heal. °· May result in depression, increased anxiety, and feelings of helplessness. °Relief of pain before surgery is also important because it will lessen the pain after surgery. Patients who receive both pain relief before and after surgery experience greater pain relief than those who only receive pain relief after surgery. Let your caregiver know if you are having uncontrolled pain. This is very important. Pain after surgery is more difficult to manage if it is permitted to become severe, so prompt and adequate treatment of acute pain is necessary. °PAIN CONTROL METHODS °Your caregivers follow policies and procedures about the management of patient pain. These guidelines should be explained to you before surgery. Plans for pain control after surgery must be mutually decided upon and instituted with your full understanding and agreement. Do not be afraid to ask questions regarding the care you are receiving. There are many different ways your caregivers will attempt to control your pain, including the following methods. °As needed pain control °· You may be given pain medicine either through your intravenous (IV) tube, or as a pill or  liquid you can swallow. You will need to let your caregiver know when you are having pain. Then, your caregiver will give you the pain medicine ordered for you. °· Your pain medicine may make you constipated. If constipation occurs, drink more liquids if you can. Your caregiver may have you take a mild laxative. °IV patient-controlled analgesia pump (PCA pump) °· You can get your pain medicine through the IV tube which goes into your vein. You are able to control the amount of pain medicine that you get. The pain medicine flows in through an IV tube and is controlled by a pump. This pump gives you a set amount of pain medicine when you push the button hooked up to it. Nobody should push this button but you or someone specifically assigned by you to do so. It is set up to keep you from accidentally giving yourself too much pain medicine. You will be able to start using your pain pump in the recovery room after your surgery. This method can be helpful for most types of surgery. °· If you are still having too much pain, tell your caregiver. Also, tell your caregiver if you are feeling too sleepy or nauseous. °Continuous epidural pain control °· A thin, soft tube (catheter) is put into your back. Pain medicine flows through the catheter to lessen pain in the part of your body where the surgery is done. Continuous epidural pain control may work best for you if you are having surgery on your chest, abdomen, hip area, or legs. The epidural catheter is usually put into your back just before surgery. The catheter is left in until you can eat and take medicine by mouth. In most cases,   this may take 2 to 3 days.  Giving pain medicine through the epidural catheter may help you heal faster because:  Your bowel gets back to normal faster.  You can get back to eating sooner.  You can be up and walking sooner. Medicine that numbs the area (local anesthetic)  You may receive an injection of pain medicine near where the  pain is (local infiltration).  You may receive an injection of pain medicine near the nerve that controls the sensation to a specific part of the body (peripheral nerve block).  Medicine may be put in the spine to block pain (spinal block). Opioids  Moderate to moderately severe acute pain after surgery may respond to opioids.Opioids are narcotic pain medicine. Opioids are often combined with non-narcotic medicines to improve pain relief, diminish the risk of side effects, and reduce the chance of addiction.  If you follow your caregiver's directions about taking opioids and you do not have a history of substance abuse, your risk of becoming addicted is exceptionally small.Opioids are given for short periods of time in careful doses to prevent addiction. Other methods of pain control include:  Steroids.  Physical therapy.  Heat and cold therapy.  Compression, such as wrapping an elastic bandage around the area of pain.  Massage. These various ways of controlling pain may be used together. Combining different methods of pain control is called multimodal analgesia. Using this approach has many benefits, including being able to eat, move around, and leave the hospital sooner. Document Released: 08/05/2002 Document Revised: 08/07/2011 Document Reviewed: 08/09/2010 [facility] Patient Information 2015 [place], [place]. This information is not intended to replace advice given to you by your health care provider. Make sure you discuss any questions you have with your health care provider.  Use the percocet 1 pill 4 x a day as needed for pain. Return for worsening shortness of breath, pain or fever.
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
## 2014-12-04 ENCOUNTER — Ambulatory Visit: Payer: Medicare Other | Admitting: Internal Medicine

## 2014-12-04 ENCOUNTER — Ambulatory Visit (INDEPENDENT_AMBULATORY_CARE_PROVIDER_SITE_OTHER): Payer: Medicare Other | Admitting: Cardiovascular Disease

## 2014-12-04 ENCOUNTER — Encounter: Payer: Self-pay | Admitting: Cardiovascular Disease

## 2014-12-04 VITALS — BP 120/64 | HR 60 | Ht 69.0 in | Wt 160.2 lb

## 2014-12-04 DIAGNOSIS — J849 Interstitial pulmonary disease, unspecified: Secondary | ICD-10-CM

## 2014-12-04 DIAGNOSIS — I25118 Atherosclerotic heart disease of native coronary artery with other forms of angina pectoris: Secondary | ICD-10-CM

## 2014-12-04 DIAGNOSIS — R0602 Shortness of breath: Secondary | ICD-10-CM | POA: Diagnosis not present

## 2014-12-04 DIAGNOSIS — E785 Hyperlipidemia, unspecified: Secondary | ICD-10-CM

## 2014-12-04 NOTE — Assessment & Plan Note (Signed)
Recent lung biopsy. He has follow-up with pulmonary. He reports breathing is stable, far from normal

## 2014-12-04 NOTE — Assessment & Plan Note (Signed)
Recent biopsy. He is close follow-up with pulmonary
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
## 2014-12-04 NOTE — Assessment & Plan Note (Signed)
Shortness of breath secondary to interstitial lung disease, followed by pulmonary

## 2014-12-04 NOTE — Patient Instructions (Signed)
You are doing well. No medication changes were made.  Please call us if you have new issues that need to be addressed before your next appt.  Your physician wants you to follow-up in: 6 months.  You will receive a reminder letter in the mail two months in advance. If you don't receive a letter, please call our office to schedule the follow-up appointment.   

## 2014-12-04 NOTE — Assessment & Plan Note (Signed)
Currently with no symptoms of angina. No further workup at this time. Continue current medication regimen. 

## 2014-12-04 NOTE — Assessment & Plan Note (Signed)
No changes to the medications were made. 

## 2014-12-10 ENCOUNTER — Ambulatory Visit: Payer: Medicare Other | Admitting: Cardiovascular Disease

## 2014-12-10 LAB — FUNGUS CULTURE W SMEAR
Culture: NO GROWTH
FUNGAL SMEAR: NO GROWTH

## 2014-12-15 ENCOUNTER — Other Ambulatory Visit: Payer: Self-pay

## 2014-12-15 MED ORDER — ROSUVASTATIN CALCIUM 10 MG PO TABS: ORAL_TABLET | ORAL | Status: DC

## 2014-12-15 MED ORDER — FENOFIBRIC ACID 135 MG PO CPDR: 1.0000 | DELAYED_RELEASE_CAPSULE | Freq: Every day | ORAL | Status: DC

## 2014-12-15 MED ORDER — AMLODIPINE BESY-BENAZEPRIL HCL 5-10 MG PO CAPS: 1.0000 | ORAL_CAPSULE | Freq: Every day | ORAL | Status: DC

## 2014-12-15 MED ORDER — CLOPIDOGREL BISULFATE 75 MG PO TABS
ORAL_TABLET | ORAL | Status: DC
Start: 2014-12-15 — End: 2016-03-23

## 2014-12-21 ENCOUNTER — Ambulatory Visit: Payer: Medicare Other | Admitting: Cardiovascular Disease

## 2014-12-23 ENCOUNTER — Encounter: Payer: Self-pay | Admitting: Internal Medicine

## 2014-12-23 ENCOUNTER — Ambulatory Visit: Payer: Medicare Other | Admitting: Internal Medicine

## 2014-12-23 ENCOUNTER — Ambulatory Visit (INDEPENDENT_AMBULATORY_CARE_PROVIDER_SITE_OTHER): Payer: Medicare Other | Admitting: Internal Medicine

## 2014-12-23 VITALS — BP 132/82 | HR 66 | Ht 69.0 in | Wt 162.4 lb

## 2014-12-23 DIAGNOSIS — J84112 Idiopathic pulmonary fibrosis: Secondary | ICD-10-CM

## 2014-12-23 DIAGNOSIS — I25118 Atherosclerotic heart disease of native coronary artery with other forms of angina pectoris: Secondary | ICD-10-CM
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
## 2014-12-23 NOTE — Progress Notes (Signed)
   Subjective:    Patient ID: Shane Reid., male    DOB: 03-15-1946, 69 y.o.   MRN: [id number]  HPI    Review of Systems  Constitutional: Negative for fever and unexpected weight change.  HENT: Negative for congestion, dental problem, ear pain, nosebleeds, postnasal drip, rhinorrhea, sinus pressure, sneezing, sore throat and trouble swallowing.   Eyes: Negative for redness and itching.  Respiratory: Positive for cough and shortness of breath. Negative for chest tightness and wheezing.   Cardiovascular: Negative for palpitations and leg swelling.  Gastrointestinal: Negative for nausea and vomiting.  Genitourinary: Negative for dysuria.  Musculoskeletal: Negative for joint swelling.  Skin: Negative for rash.  Neurological: Negative for headaches.  Hematological: Does not bruise/bleed easily.  Psychiatric/Behavioral: Negative for dysphoric mood. The patient is not nervous/anxious.        Objective:   Physical Exam        Assessment & Plan:

## 2014-12-23 NOTE — Patient Instructions (Addendum)
ICD-9-CM ICD-10-CM   1. IPF (idiopathic pulmonary fibrosis) 516.31 J84.112    You have IPF diseae; specific type pf pulmonary fibrosis Generally progressive with worsening shortness of breath and cough over time There is a medication to slow this down Recommend starting Esbriet as discussed - life long therapy  Followup 6 weeks do LFT blood test 6 weeks with NP tammy  Or me to ensure titration going well Refer Pulmonary Rehab at [facility] Once you are doing well on tablet, then will have DR Mungal follow you more closely

## 2014-12-25 ENCOUNTER — Encounter: Payer: Self-pay | Admitting: *Deleted

## 2014-12-25 ENCOUNTER — Emergency Department
Admission: EM | Admit: 2014-12-25 | Discharge: 2014-12-25 | Disposition: A | Discharge status: Home / Self Care | Payer: Medicare Other | Attending: Emergency Medicine | Admitting: Emergency Medicine

## 2014-12-25 DIAGNOSIS — Z0000 Encounter for general adult medical examination without abnormal findings: Secondary | ICD-10-CM | POA: Diagnosis not present

## 2014-12-25 DIAGNOSIS — F4325 Adjustment disorder with mixed disturbance of emotions and conduct: Secondary | ICD-10-CM

## 2014-12-25 DIAGNOSIS — Z7902 Long term (current) use of antithrombotics/antiplatelets: Secondary | ICD-10-CM | POA: Insufficient documentation

## 2014-12-25 DIAGNOSIS — I10 Essential (primary) hypertension: Secondary | ICD-10-CM | POA: Diagnosis not present

## 2014-12-25 DIAGNOSIS — F1510 Other stimulant abuse, uncomplicated: Secondary | ICD-10-CM | POA: Diagnosis not present

## 2014-12-25 DIAGNOSIS — Z79899 Other long term (current) drug therapy: Secondary | ICD-10-CM | POA: Diagnosis not present

## 2014-12-25 DIAGNOSIS — F919 Conduct disorder, unspecified: Secondary | ICD-10-CM | POA: Diagnosis present

## 2014-12-25 DIAGNOSIS — Z87891 Personal history of nicotine dependence: Secondary | ICD-10-CM | POA: Insufficient documentation

## 2014-12-25 LAB — ETHANOL: Alcohol, Ethyl (B): <5 mg/dL (ref <5)

## 2014-12-25 LAB — COMPREHENSIVE METABOLIC PANEL
ALT: 28 U/L (ref 17–63)
AST: 37 U/L (ref 15–41)
Albumin: 4.4 g/dL (ref 3.5–5.0)
Alkaline Phosphatase: 46 U/L (ref 38–126)
Anion gap: 9 (ref 5–15)
BUN: 8 mg/dL (ref 6–20)
CHLORIDE: 106 mmol/L (ref 101–111)
CO2: 24 mmol/L (ref 22–32)
CREATININE: 1.3 mg/dL — AB (ref 0.61–1.24)
Calcium: 9.2 mg/dL (ref 8.9–10.3)
GFR calc Af Amer: >60 mL/min (ref >60)
GFR, EST NON AFRICAN AMERICAN: 54 mL/min — AB (ref >60)
Glucose, Bld: 130 mg/dL — ABNORMAL HIGH (ref 65–99)
Potassium: 3.6 mmol/L (ref 3.5–5.1)
Sodium: 139 mmol/L (ref 135–145)
TOTAL PROTEIN: 8 g/dL (ref 6.5–8.1)
Total Bilirubin: 0.4 mg/dL (ref 0.3–1.2)

## 2014-12-25 LAB — URINE DRUG SCREEN, QUALITATIVE (ARMC ONLY)
AMPHETAMINES, UR SCREEN: NOT DETECTED
BENZODIAZEPINE, UR SCRN: NOT DETECTED
Barbiturates, Ur Screen: NOT DETECTED
CANNABINOID 50 NG, UR ~~LOC~~: NOT DETECTED
COCAINE METABOLITE, UR ~~LOC~~: NOT DETECTED
MDMA (Ecstasy)Ur Screen: POSITIVE — AB
METHADONE SCREEN, URINE: NOT DETECTED
Opiate, Ur Screen: NOT DETECTED
Phencyclidine (PCP) Ur S: NOT DETECTED
TRICYCLIC, UR SCREEN: NOT DETECTED

## 2014-12-25 LAB — ACETAMINOPHEN LEVEL: Acetaminophen (Tylenol), Serum: <10 ug/mL — ABNORMAL LOW (ref 10–30)

## 2014-12-25 LAB — CBC
HCT: 41.6 % (ref 40.0–52.0)
Hemoglobin: 14.1 g/dL (ref 13.0–18.0)
MCH: 28.9 pg (ref 26.0–34.0)
MCHC: 33.8 g/dL (ref 32.0–36.0)
MCV: 85.4 fL (ref 80.0–100.0)
Platelets: 276 10*3/uL (ref 150–440)
RBC: 4.88 MIL/uL (ref 4.40–5.90)
RDW: 13.6 % (ref 11.5–14.5)
WBC: 9.9 10*3/uL (ref 3.8–10.6)

## 2014-12-25 LAB — SALICYLATE LEVEL: Salicylate Lvl: <4 mg/dL (ref 2.8–30.0)

## 2014-12-25 NOTE — ED Notes (Signed)
BEHAVIORAL HEALTH ROUNDING Patient sleeping: No. Patient alert and oriented: yes Behavior appropriate: Yes.  ; If no, describe:  Nutrition and fluids offered: Yes  Toileting and hygiene offered: Yes  Sitter present: yes Law enforcement present: Yes  

## 2014-12-25 NOTE — Discharge Instructions (Signed)
Be careful what you say when you are angry!  Return for any problems.

## 2014-12-25 NOTE — BH Assessment (Addendum)
Tele Assessment Note   Shane Bonder. is an 69 y.o. male that presented to [facility] under IVC for SI.  Pt seen for tele assessment by this clinician.  Per ED notes, pt placed under IVC for SI.  Pt adamantly denies wanting to hurt himself or anyone else.  Pt stated, "I stuck my foot in my mouth."  Pt denies any hx of self-harm or harm to others.  Pt denies HI or AVH.  No delusions noted.  Pt denies depressive sx, although pt's chart shows hx of depression.  Pt denies any previous mental health or SA treatment.  Pt denies SA.  Pt cooperative, pleasant, oriented x 4, has logical/coherent thought processes, good eye contact, euthymic mood, appropriate affect.  Pt stated he has 2 granddaughters that he wants to be there for and also takes care of his ex-wife (he lives with her).  Pt asking to go home and stated he does not want to harm himself or others.  Per [facility] staff at [facility], pt to be seen by psych there to determine pt disposition.  Updated TTS staff.  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 Anxiety   . Asthma   . Depression   . GERD (gastroesophageal reflux disease)   . Hypercholesteremia   . Bradycardia   . Coronary artery disease     s/p stent 2008  . Other activity(E029.9)     Social stressor, wife with mental illness  . Anginal pain   . Hypertension   . Heart murmur   . COPD (chronic obstructive pulmonary disease)    Axis IV: other psychosocial or environmental problems Axis V: 51-60 moderate symptoms  Past Medical History:  Past Medical History  Diagnosis Date  . Anxiety   . Asthma   . Depression   . GERD (gastroesophageal reflux disease)   . Hypercholesteremia   . Bradycardia   . Coronary artery disease     s/p stent 2008  . Other activity(E029.9)     Social stressor, wife with mental illness  . Anginal pain   . Hypertension   . Heart murmur   . COPD (chronic obstructive  pulmonary disease)     Past Surgical History  Procedure Laterality Date  . Cardiac catheterization    . Testicle surgery    . Esophagus stretch    . Coronary angioplasty      stents times 4  . Eye surgery    . Video assisted thoracoscopy (vats)/thorocotomy Right 11/18/2014    Procedure: VIDEO ASSISTED THORACOSCOPY (VATS)/ wedge resection biopsy;  Surgeon: Hulda Marin, MD;  Location: [facility] ORS;  Service: General;  Laterality: Right;  . Lung biopsy      Family History:  Family History  Problem Relation Age of Onset  . Coronary artery disease Mother     s/p CABG  . Heart failure Mother   . Heart disease Other   . Depression Other     Social History:  reports that he quit smoking about 46 years ago. His smoking use included Cigarettes. He has a .5 pack-year smoking history. He has never used smokeless tobacco. He reports that he does not drink alcohol or use illicit drugs.  Additional Social History:  Alcohol / Drug Use Pain Medications: see med list Prescriptions: see med list Over the Counter: see med list History of alcohol / drug use?: No history of alcohol / drug abuse Longest period  of sobriety (when/how long):  (na) Negative Consequences of Use:  (na) Withdrawal Symptoms:  (na)  CIWA: CIWA-Ar BP: (!) 164/99 mmHg Pulse Rate: 88 COWS:    PATIENT STRENGTHS: (choose at least two) Capable of independent living Communication skills General fund of knowledge  Allergies:  Allergies  Allergen Reactions  . Metoprolol Succinate Other (See Comments)    Reaction:  Unknown   . Niacin Other (See Comments)    Reaction:  Unknown   . Sertraline Hcl Other (See Comments)    Reaction:  Unknown     Home Medications:  (Not in a hospital admission)  OB/GYN Status:  No LMP for male patient.  General Assessment Data Location of Assessment: [facility] ED TTS Assessment: In system Is this a Tele or Face-to-Face Assessment?: Tele Assessment Is this an Initial Assessment or a  Re-assessment for this encounter?: Initial Assessment Marital status: Divorced [place] name: na Is patient pregnant?:  (na) Pregnancy Status:  (na) Living Arrangements: Spouse/significant other Can pt return to current living arrangement?: Yes Admission Status: Involuntary (Pt states he was mad at the police for investigating at this home, pt denies any thoughts of SI or HI, states "my mouth got away from me, I was stupid". Pt alert and oriented in room. ) Is patient capable of signing voluntary admission?: Yes Referral Source: Other Insurance type: Medicare  Medical Screening Exam [facility] Walk-in ONLY) Medical Exam completed: No Reason for MSE not completed:  (na)  Crisis Care Plan Living Arrangements: Spouse/significant other Name of Psychiatrist: na Name of Therapist: na  Education Status Is patient currently in school?: No Current Grade: na Highest grade of school patient has completed: 9 Name of school: na Contact person: na  Risk to self with the past 6 months Suicidal Ideation: No Has patient been a risk to self within the past 6 months prior to admission? : No Suicidal Intent: No Has patient had any suicidal intent within the past 6 months prior to admission? : No Is patient at risk for suicide?: No Suicidal Plan?: No Has patient had any suicidal plan within the past 6 months prior to admission? : No Access to Means: No What has been your use of drugs/alcohol within the last 12 months?: na-pt denies Previous Attempts/Gestures: No How many times?: 0 Other Self Harm Risks: na Triggers for Past Attempts: None known Intentional Self Injurious Behavior: None Family Suicide History: No Recent stressful life event(s): Conflict (Comment) Persecutory voices/beliefs?: No Depression:  (na-pt denies) Depression Symptoms:  (pt denies) Substance abuse history and/or treatment for substance abuse?: No Suicide prevention information given to non-admitted patients: Not  applicable  Risk to Others within the past 6 months Homicidal Ideation: No Does patient have any lifetime risk of violence toward others beyond the six months prior to admission? : No Thoughts of Harm to Others: No Current Homicidal Intent: No Current Homicidal Plan: No Access to Homicidal Means: No Identified Victim: na-pt denies History of harm to others?: No Assessment of Violence: None Noted Violent Behavior Description: na-pt cooperative Does patient have access to weapons?: No Criminal Charges Pending?: No Does patient have a court date: No Is patient on probation?: No  Psychosis Hallucinations: None noted Delusions: None noted  Mental Status Report Appearance/Hygiene: Disheveled, In scrubs Eye Contact: Good Motor Activity: Freedom of movement, Unremarkable Speech: Logical/coherent Level of Consciousness: Alert Mood: Euthymic Affect: Appropriate to circumstance Anxiety Level: None Thought Processes: Coherent, Relevant Judgement: Unimpaired Orientation: Person, Place, Time, Situation Obsessive Compulsive Thoughts/Behaviors: None  Cognitive Functioning  Concentration: Normal Memory: Recent Intact, Remote Intact IQ: Average Insight: Fair Impulse Control: Fair Appetite: Fair Weight Loss: 8 Weight Gain: 0 Sleep: No Change Total Hours of Sleep:  (varies) Vegetative Symptoms: None  ADLScreening [facility] Assessment Services) Patient's cognitive ability adequate to safely complete daily activities?: Yes Patient able to express need for assistance with ADLs?: Yes Independently performs ADLs?: Yes (appropriate for developmental age)  Prior Inpatient Therapy Prior Inpatient Therapy: No Prior Therapy Dates: na Prior Therapy Facilty/Provider(s): na Reason for Treatment: na  Prior Outpatient Therapy Prior Outpatient Therapy: No Prior Therapy Dates: na Prior Therapy Facilty/Provider(s): na Reason for Treatment: na Does patient have an ACCT team?: No Does patient  have Intensive In-House Services?  : No Does patient have [facility] services? : No Does patient have P4CC services?: No  ADL Screening (condition at time of admission) Patient's cognitive ability adequate to safely complete daily activities?: Yes Is the patient deaf or have difficulty hearing?: No Does the patient have difficulty seeing, even when wearing glasses/contacts?: No Does the patient have difficulty concentrating, remembering, or making decisions?: No Patient able to express need for assistance with ADLs?: Yes Does the patient have difficulty dressing or bathing?: No Independently performs ADLs?: Yes (appropriate for developmental age) Does the patient have difficulty walking or climbing stairs?: No  Home Assistive Devices/Equipment Home Assistive Devices/Equipment: None    Abuse/Neglect Assessment (Assessment to be complete while patient is alone) Physical Abuse: Denies Verbal Abuse: Denies Sexual Abuse: Denies Exploitation of patient/patient's resources: Denies Self-Neglect: Denies Values / Beliefs Cultural Requests During Hospitalization: None Spiritual Requests During Hospitalization: None Consults Spiritual Care Consult Needed: No Social Work Consult Needed: No Merchant navy officer (For Healthcare) Does patient have an advance directive?: No Would patient like information on creating an advanced directive?: No - patient declined information    Additional Information 1:1 In Past 12 Months?: No CIRT Risk: No Elopement Risk: No Does patient have medical clearance?: Yes     Disposition:  Disposition Initial Assessment Completed for this Encounter: Yes Disposition of Patient: Other dispositions Other disposition(s): Other (Comment) (pending disposition)  Casimer Lanius, MS, [facility] Therapeutic Triage Specialist [facility]   12/25/2014 5:31 PM

## 2014-12-25 NOTE — ED Notes (Signed)
IVC pt came with papers °

## 2014-12-25 NOTE — ED Notes (Signed)

## 2014-12-25 NOTE — ED Provider Notes (Signed)
[facility] Provider Note  ____________________________________________  Time seen: Approximately 6:11 PM  I have reviewed the triage vital signs and the nursing notes.   HISTORY  Chief Complaint Behavior Problem   HPI [facility]. is a 70 y.o. male had a recent pulmonary biopsy diagnosed him with idiopathic pulmonary fibrosis. Patient was at home and a detective came into his house and followed around and wouldn't leave he says. Patient got upset and said he would kill himself of the detective didn't leave. Patient really did not mean he was given a kill himself he says. Patient says he has no intention of hurting himself. He said the same thing to Dr. Mat [facility] packs Dr. [facility] packs will has discontinue the commitment  \  Past Medical History  Diagnosis Date  . Anxiety   . Asthma   . Depression   . GERD (gastroesophageal reflux disease)   . Hypercholesteremia   . Bradycardia   . Coronary artery disease     s/p stent 2008  . Other activity(E029.9)     Social stressor, wife with mental illness  . Anginal pain   . Hypertension   . Heart murmur   . COPD (chronic obstructive pulmonary disease)     Patient Active Problem List   Diagnosis Date Noted  . 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 IPF (idiopathic pulmonary fibrosis) 12/23/2014  . Pre-operative cardiovascular examination 11/05/2014  . ILD (interstitial lung disease) 09/04/2014  . Interstitial lung disease 06/22/2014  . Upper respiratory infection 12/03/2013  . CAROTID BRUIT, LEFT 05/12/2010  . Hyperlipidemia 07/01/2009  . CAD, NATIVE VESSEL 07/01/2009  . SHORTNESS OF BREATH 07/01/2009  . Atypical chest pain 07/01/2009    Past Surgical History  Procedure Laterality Date  . Cardiac catheterization    . Testicle surgery    . Esophagus stretch    . Coronary angioplasty      stents times 4  . Eye surgery    . Video  assisted thoracoscopy (vats)/thorocotomy Right 11/18/2014    Procedure: VIDEO ASSISTED THORACOSCOPY (VATS)/ wedge resection biopsy;  Surgeon: Shane Marin, MD;  Location: [facility] ORS;  Service: General;  Laterality: Right;  . Lung biopsy      Current Outpatient Rx  Name  Route  Sig  Dispense  Refill  . amLODipine-benazepril (LOTREL) 5-10 MG per capsule   Oral   Take 1 capsule by mouth daily.   90 capsule   3   . Choline Fenofibrate (FENOFIBRIC ACID) 135 MG CPDR   Oral   Take 1 capsule by mouth daily.   30 capsule   3   . clopidogrel (PLAVIX) 75 MG tablet      TAKE ONE (1) TABLET BY MOUTH EVERY DAY Patient taking differently: Take 75 mg by mouth daily.    30 tablet   3   . oxyCODONE-acetaminophen (PERCOCET/ROXICET) 5-325 MG per tablet   Oral   Take 1 tablet by mouth every 4 (four) hours as needed for severe pain.         . rosuvastatin (CRESTOR) 10 MG tablet      TAKE ONE (1) TABLET BY MOUTH EVERY DAY Patient taking differently: Take 10 mg by mouth daily.    30 tablet   3   . albuterol (PROVENTIL) (2.5 MG/3ML) 0.083% nebulizer solution   Nebulization   Take 3 mLs (2.5 mg total) by nebulization every 6 (six) hours as needed. Patient not taking: Reported on  12/25/2014   75 mL   11   . nitroGLYCERIN (NITROSTAT) 0.4 MG SL tablet   Sublingual   Place 1 tablet (0.4 mg total) under the tongue every 5 (five) minutes as needed. Patient not taking: Reported on 12/25/2014   25 tablet   6     Allergies Metoprolol succinate; Niacin; and Sertraline hcl  Family History  Problem Relation Age of Onset  . Coronary artery disease Mother     s/p CABG  . Heart failure Mother   . Heart disease Other   . Depression Other     Social History History  Substance Use Topics  . Smoking status: Former Smoker -- 0.50 packs/day for 1 years    Types: Cigarettes    Quit date: 05/09/1968  . Smokeless tobacco: Never Used  . Alcohol Use: No    Review of Systems Constitutional: No  fever/chills Eyes: No visual changes. ENT: No sore throat. Cardiovascular: Denies chest pain. Respiratory: Denies shortness of breath. Gastrointestinal: No abdominal pain.  No nausea, no vomiting.  No diarrhea.  No constipation. Genitourinary: Negative for dysuria. Musculoskeletal: Negative for back pain. Skin: Negative for rash. Neurological: Negative for headaches, focal weakness or numbness.  10-point ROS otherwise negative.  ____________________________________________   PHYSICAL EXAM:  VITAL SIGNS: ED Triage Vitals  Enc Vitals Group     BP 12/25/14 1512 164/99 mmHg     Pulse Rate 12/25/14 1512 88     Resp 12/25/14 1512 18     Temp 12/25/14 1512 98.4 F (36.9 C)     Temp Source 12/25/14 1512 Oral     SpO2 12/25/14 1512 98 %     Weight 12/25/14 1512 162 lb (73.483 kg)     Height 12/25/14 1512 5\' 9"  (1.753 m)     Head Cir --      Peak Flow --      Pain Score --      Pain Loc --      Pain Edu? --      Excl. in GC? --     Constitutional: Alert and oriented. Well appearing and in no acute distress. Eyes: Conjunctivae are normal. PERRL. EOMI. Head: Atraumatic. Nose: No congestion/rhinnorhea. Mouth/Throat: Mucous membranes are moist.  Oropharynx non-erythematous. Neck: No stridor. Cardiovascular: Normal rate, regular rhythm. Grossly normal heart sounds.  Good peripheral circulation. Respiratory: Normal respiratory effort.  No retractions. Lungs CTAB. Gastrointestinal: Soft and nontender. No distention. No abdominal bruits. No CVA tenderness. Musculoskeletal: No lower extremity tenderness nor edema.  No joint effusions. Neurologic:  Normal speech and language. No gross focal neurologic deficits are appreciated. No gait instability. Skin:  Skin is warm, dry and intact. No rash noted. Psychiatric: Mood and affect are normal. Speech and behavior are normal.  ____________________________________________   LABS (all labs ordered are listed, but only abnormal results are  displayed)  Labs Reviewed  COMPREHENSIVE METABOLIC PANEL - Abnormal; Notable for the following:    Glucose, Bld 130 (*)    Creatinine, Ser 1.30 (*)    GFR calc non Af Amer 54 (*)    All other components within normal limits  ACETAMINOPHEN LEVEL - Abnormal; Notable for the following:    Acetaminophen (Tylenol), Serum <10 (*)    All other components within normal limits  URINE DRUG SCREEN, QUALITATIVE ([facility] ONLY) - Abnormal; Notable for the following:    MDMA (Ecstasy)Ur Screen POSITIVE (*)    All other components within normal limits  ETHANOL  SALICYLATE LEVEL  CBC  ____________________________________________  EKG   ____________________________________________  RADIOLOGY  ____________________________________________   PROCEDURES    ____________________________________________   INITIAL IMPRESSION / ASSESSMENT AND PLAN / ED COURSE  Pertinent labs & imaging results that were available during my care of the patient were reviewed by me and considered in my medical decision making (see chart for details).   ____________________________________________   FINAL CLINICAL IMPRESSION(S) / ED DIAGNOSES  Final diagnoses:  Normal physical exam      Arnaldo Natal, MD 12/25/14 [phone]

## 2014-12-25 NOTE — ED Notes (Signed)
Pt brought in by [employer] under IVC for suicidal ideation, pt denies SI/HI, pt reports " police officer made me mad; I do not want to hurt myself"

## 2014-12-25 NOTE — Consult Note (Signed)
[facility] Face-to-Face Psychiatry Consult   Reason for Consult:  Consult for this 69 year old man brought in under involuntary commitment by law enforcement Referring Physician:  Cinda Quest Patient Identification: [name]. MRN:  [id number] Principal Diagnosis: Adjustment disorder with mixed disturbance of emotions and conduct Diagnosis:   Patient Active Problem List   Diagnosis Date Noted  . Adjustment disorder with mixed disturbance of emotions and conduct [F43.25] 12/25/2014  . IPF (idiopathic pulmonary fibrosis) [J84.112] 12/23/2014  . Pre-operative cardiovascular examination [Z01.810] 11/05/2014  . ILD (interstitial lung disease) [J84.9] 09/04/2014  . Interstitial lung disease [J84.9] 06/22/2014  . Upper respiratory infection [J06.9] 12/03/2013  . CAROTID BRUIT, LEFT [R09.89] 05/12/2010  . Hyperlipidemia [E78.5] 07/01/2009  . CAD, NATIVE VESSEL [I25.10] 07/01/2009  . SHORTNESS OF BREATH [R06.02] 07/01/2009  . Atypical chest pain [R07.89] 07/01/2009    Total Time spent with patient: 1 hour  Subjective:   Lawerence Dery. is a 69 y.o. male patient admitted with "I put my foot in my mouth".  HPI:  Information from the patient and the chart. Commitment paperwork states that he made a statement about how he wishes that he had killed himself. The patient admits this but tells the whole context of the story. Police officers had come to his house to interview his ex-wife with whom he lives regarding some issue that primarily involves her. The patient states that he does not like talking to police officers as it makes him very anxious. The police officers tried to ask questions of him and he refused to talk with them. He says that they continued to badger him and that ultimately he simply said that he wishes that he would commit suicide. At that point they insisted that he come to the hospital. Patient says he said it only because he was losing his temper and frustrated with the police.  He denies having any suicidal thought or intent at all. He says his mood recently has been fine. He sleeps fine. His appetite is fine. He does have some chronic stress related to his care for his ex-wife and also to his own multiple medical problems but totally denies suicidal thought. Denies psychotic symptoms.  Past psychiatric history: Patient denies any history of psychiatric hospitalization. Denies any mental health treatment whatsoever. Says he has not been on any medication for depression. Evidently according to the other evaluator there was a mention somewhere in his chart of depression but nothing recently and no detail about it. As he's never tried to hurt himself in the past. No history of violence.  Social history: He lives with his ex-wife who is even older than he is. He takes care of her multiple medical problems. It sounds like a rather odd relationship and somewhat explosive but stable.  Medical history: Wide range of multiple medical problems including coronary artery disease and a new diagnosis of pulmonary fibrosis may just recently.  Substance abuse history: Says that he does not drink alcohol does not use drugs and doesn't have any alcohol or drug history.  Family history: He is not aware of any family history of mental illness HPI Elements:   Quality:  Impulsive statement about suicidality. Severity:  Fairly minimal. Timing:  Happened at the climax along dispute he was having with Administrator. Duration:  Very transient. Context:  Irritability about being badgered in his home..  Past Medical History:  Past Medical History  Diagnosis Date  . Anxiety   . Asthma   . Depression   .  GERD (gastroesophageal reflux disease)   . Hypercholesteremia   . Bradycardia   . Coronary artery disease     s/p stent 2008  . Other activity(E029.9)     Social stressor, wife with mental illness  . Anginal pain   . Hypertension   . Heart murmur   . COPD (chronic obstructive  pulmonary disease)     Past Surgical History  Procedure Laterality Date  . Cardiac catheterization    . Testicle surgery    . Esophagus stretch    . Coronary angioplasty      stents times 4  . Eye surgery    . Video assisted thoracoscopy (vats)/thorocotomy Right 11/18/2014    Procedure: VIDEO ASSISTED THORACOSCOPY (VATS)/ wedge resection biopsy;  Surgeon: Nestor Lewandowsky, MD;  Location: [facility] ORS;  Service: General;  Laterality: Right;  . Lung biopsy     Family History:  Family History  Problem Relation Age of Onset  . Coronary artery disease Mother     s/p CABG  . Heart failure Mother   . Heart disease Other   . Depression Other    Social History:  History  Alcohol Use No     History  Drug Use No    History   Social History  . Marital Status: Single    Spouse Name: N/A  . Number of Children: N/A  . Years of Education: N/A   Social History Main Topics  . Smoking status: Former Smoker -- 0.50 packs/day for 1 years    Types: Cigarettes    Quit date: 05/09/1968  . Smokeless tobacco: Never Used  . Alcohol Use: No  . Drug Use: No  . Sexual Activity: No   Other Topics Concern  . None   Social History Narrative   Divorced but lives with ex-wife   On SSI disability, wife with mental illness         Additional Social History:    Pain Medications: see med list Prescriptions: see med list Over the Counter: see med list History of alcohol / drug use?: No history of alcohol / drug abuse Longest period of sobriety (when/how long):  (na) Negative Consequences of Use:  (na) Withdrawal Symptoms:  (na)                     Allergies:   Allergies  Allergen Reactions  . Metoprolol Succinate Other (See Comments)    Reaction:  Unknown   . Niacin Other (See Comments)    Reaction:  Unknown   . Sertraline Hcl Other (See Comments)    Reaction:  Unknown     Labs:  Results for orders placed or performed during the hospital encounter of 12/25/14 (from the past 48  hour(s))  Comprehensive metabolic panel     Status: Abnormal   Collection Time: 12/25/14  3:15 PM  Result Value Ref Range   Sodium 139 135 - 145 mmol/L   Potassium 3.6 3.5 - 5.1 mmol/L   Chloride 106 101 - 111 mmol/L   CO2 24 22 - 32 mmol/L   Glucose, Bld 130 (H) 65 - 99 mg/dL   BUN 8 6 - 20 mg/dL   Creatinine, Ser 1.30 (H) 0.61 - 1.24 mg/dL   Calcium 9.2 8.9 - 10.3 mg/dL   Total Protein 8.0 6.5 - 8.1 g/dL   Albumin 4.4 3.5 - 5.0 g/dL   AST 37 15 - 41 U/L   ALT 28 17 - 63 U/L   Alkaline Phosphatase 46 38 -  126 U/L   Total Bilirubin 0.4 0.3 - 1.2 mg/dL   GFR calc non Af Amer 54 (L) >60 mL/min   GFR calc Af Amer >60 >60 mL/min    Comment: (NOTE) The eGFR has been calculated using the CKD EPI equation. This calculation has not been validated in all clinical situations. eGFR's persistently <60 mL/min signify possible Chronic Kidney Disease.    Anion gap 9 5 - 15  Ethanol (ETOH)     Status: None   Collection Time: 12/25/14  3:15 PM  Result Value Ref Range   Alcohol, Ethyl (B) <5 <5 mg/dL    Comment:        LOWEST DETECTABLE LIMIT FOR SERUM ALCOHOL IS 5 mg/dL FOR MEDICAL PURPOSES ONLY   Salicylate level     Status: None   Collection Time: 12/25/14  3:15 PM  Result Value Ref Range   Salicylate Lvl <4.8 2.8 - 30.0 mg/dL  Acetaminophen level     Status: Abnormal   Collection Time: 12/25/14  3:15 PM  Result Value Ref Range   Acetaminophen (Tylenol), Serum <10 (L) 10 - 30 ug/mL    Comment:        THERAPEUTIC CONCENTRATIONS VARY SIGNIFICANTLY. A RANGE OF 10-30 ug/mL MAY BE AN EFFECTIVE CONCENTRATION FOR MANY PATIENTS. HOWEVER, SOME ARE BEST TREATED AT CONCENTRATIONS OUTSIDE THIS RANGE. ACETAMINOPHEN CONCENTRATIONS >150 ug/mL AT 4 HOURS AFTER INGESTION AND >50 ug/mL AT 12 HOURS AFTER INGESTION ARE OFTEN ASSOCIATED WITH TOXIC REACTIONS.   CBC     Status: None   Collection Time: 12/25/14  3:15 PM  Result Value Ref Range   WBC 9.9 3.8 - 10.6 K/uL   RBC 4.88 4.40 - 5.90  MIL/uL   Hemoglobin 14.1 13.0 - 18.0 g/dL   HCT 41.6 40.0 - 52.0 %   MCV 85.4 80.0 - 100.0 fL   MCH 28.9 26.0 - 34.0 pg   MCHC 33.8 32.0 - 36.0 g/dL   RDW 13.6 11.5 - 14.5 %   Platelets 276 150 - 440 K/uL  Urine Drug Screen, Qualitative ([facility] only)     Status: Abnormal   Collection Time: 12/25/14  3:15 PM  Result Value Ref Range   Tricyclic, Ur Screen NONE DETECTED NONE DETECTED   Amphetamines, Ur Screen NONE DETECTED NONE DETECTED   MDMA (Ecstasy)Ur Screen POSITIVE (A) NONE DETECTED   Cocaine Metabolite,Ur [place] NONE DETECTED NONE DETECTED   Opiate, Ur Screen NONE DETECTED NONE DETECTED   Phencyclidine (PCP) Ur S NONE DETECTED NONE DETECTED   Cannabinoid 50 Ng, Ur [place] NONE DETECTED NONE DETECTED   Barbiturates, Ur Screen NONE DETECTED NONE DETECTED   Benzodiazepine, Ur Scrn NONE DETECTED NONE DETECTED   Methadone Scn, Ur NONE DETECTED NONE DETECTED    Comment: (NOTE) 016  Tricyclics, urine               Cutoff 1000 ng/mL 200  Amphetamines, urine             Cutoff 1000 ng/mL 300  MDMA (Ecstasy), urine           Cutoff 500 ng/mL 400  Cocaine Metabolite, urine       Cutoff 300 ng/mL 500  Opiate, urine                   Cutoff 300 ng/mL 600  Phencyclidine (PCP), urine      Cutoff 25 ng/mL 700  Cannabinoid, urine  Cutoff 50 ng/mL 800  Barbiturates, urine             Cutoff 200 ng/mL 900  Benzodiazepine, urine           Cutoff 200 ng/mL 1000 Methadone, urine                Cutoff 300 ng/mL 1100 1200 The urine drug screen provides only a preliminary, unconfirmed 1300 analytical test result and should not be used for non-medical 1400 purposes. Clinical consideration and professional judgment should 1500 be applied to any positive drug screen result due to possible 1600 interfering substances. A more specific alternate chemical method 1700 must be used in order to obtain a confirmed analytical result.  1800 Gas chromato graphy / mass spectrometry (GC/MS) is the  preferred 1900 confirmatory method.     Vitals: Blood pressure 164/99, pulse 88, temperature 98.4 F (36.9 C), temperature source Oral, resp. rate 18, height 5' 9"  (1.753 m), weight 73.483 kg (162 lb), SpO2 98 %.  Risk to Self: Suicidal Ideation: No Suicidal Intent: No Is patient at risk for suicide?: No Suicidal Plan?: No Access to Means: No What has been your use of drugs/alcohol within the last 12 months?: na-pt denies How many times?: 0 Other Self Harm Risks: na Triggers for Past Attempts: None known Intentional Self Injurious Behavior: None Risk to Others: Homicidal Ideation: No Thoughts of Harm to Others: No Current Homicidal Intent: No Current Homicidal Plan: No Access to Homicidal Means: No Identified Victim: na-pt denies History of harm to others?: No Assessment of Violence: None Noted Violent Behavior Description: na-pt cooperative Does patient have access to weapons?: No Criminal Charges Pending?: No Does patient have a court date: No Prior Inpatient Therapy: Prior Inpatient Therapy: No Prior Therapy Dates: na Prior Therapy Facilty/Provider(s): na Reason for Treatment: na Prior Outpatient Therapy: Prior Outpatient Therapy: No Prior Therapy Dates: na Prior Therapy Facilty/Provider(s): na Reason for Treatment: na Does patient have an ACCT team?: No Does patient have Intensive In-House Services?  : No Does patient have [facility] services? : No Does patient have P4[facility] services?: No  No current facility-administered medications for this encounter.   Current Outpatient Prescriptions  Medication Sig Dispense Refill  . amLODipine-benazepril (LOTREL) 5-10 MG per capsule Take 1 capsule by mouth daily. 90 capsule 3  . Choline Fenofibrate (FENOFIBRIC ACID) 135 MG CPDR Take 1 capsule by mouth daily. 30 capsule 3  . clopidogrel (PLAVIX) 75 MG tablet TAKE ONE (1) TABLET BY MOUTH EVERY DAY (Patient taking differently: Take 75 mg by mouth daily. ) 30 tablet 3  .  oxyCODONE-acetaminophen (PERCOCET/ROXICET) 5-325 MG per tablet Take 1 tablet by mouth every 4 (four) hours as needed for severe pain.    . rosuvastatin (CRESTOR) 10 MG tablet TAKE ONE (1) TABLET BY MOUTH EVERY DAY (Patient taking differently: Take 10 mg by mouth daily. ) 30 tablet 3  . albuterol (PROVENTIL) (2.5 MG/3ML) 0.083% nebulizer solution Take 3 mLs (2.5 mg total) by nebulization every 6 (six) hours as needed. (Patient not taking: Reported on 12/25/2014) 75 mL 11  . nitroGLYCERIN (NITROSTAT) 0.4 MG SL tablet Place 1 tablet (0.4 mg total) under the tongue every 5 (five) minutes as needed. (Patient not taking: Reported on 12/25/2014) 25 tablet 6    Musculoskeletal: Strength & Muscle Tone: within normal limits Gait & Station: normal Patient leans: N/A  Psychiatric Specialty Exam: Physical Exam  Constitutional: He appears well-developed and well-nourished.  HENT:  Head: Normocephalic and atraumatic.  Eyes: Conjunctivae  are normal. Pupils are equal, round, and reactive to light.  Neck: Normal range of motion.  Cardiovascular: Normal heart sounds.   Respiratory: Effort normal.  GI: Soft.  Musculoskeletal: Normal range of motion.  Neurological: He is alert.  Skin: Skin is warm and dry.  Psychiatric: He has a normal mood and affect. His speech is normal and behavior is normal. Thought content normal. Cognition and memory are normal. He expresses impulsivity.    Review of Systems  Constitutional: Negative.   HENT: Negative.   Eyes: Negative.   Respiratory: Negative.   Cardiovascular: Negative.   Gastrointestinal: Negative.   Musculoskeletal: Negative.   Skin: Negative.   Neurological: Negative.   Psychiatric/Behavioral: Negative for depression, suicidal ideas, hallucinations, memory loss and substance abuse. The patient is not nervous/anxious and does not have insomnia.     Blood pressure 164/99, pulse 88, temperature 98.4 F (36.9 C), temperature source Oral, resp. rate 18, height  5' 9"  (1.753 m), weight 73.483 kg (162 lb), SpO2 98 %.Body mass index is 23.91 kg/(m^2).  General Appearance: Disheveled  Eye Contact::  Good  Speech:  Clear and Coherent  Volume:  Normal  Mood:  Euthymic  Affect:  Congruent  Thought Process:  Tangential  Orientation:  Full (Time, Place, and Person)  Thought Content:  Negative  Suicidal Thoughts:  No  Homicidal Thoughts:  No  Memory:  Immediate;   Good Recent;   Fair Remote;   Good  Judgement:  Intact  Insight:  Fair  Psychomotor Activity:  Normal  Concentration:  Fair  Recall:  [employer] of Knowledge:Fair  Language: Fair  Akathisia:  No  Handed:  Right  AIMS (if indicated):     Assets:  Communication Skills Housing Resilience Social Support  ADL's:  Intact  Cognition: WNL  Sleep:      Medical Decision Making: New problem, with additional work up planned, Review of Psycho-Social Stressors (1), Review or order clinical lab tests (1), Discuss test with performing physician (1) and Review and summation of old records (2)  Treatment Plan Summary: Plan On further evaluation the patient's story seems to stand up. We contacted his ex-wife who confirms that she has no concern that he is suicidal. Old records don't reveal any history of serious mental illness or suicidal behavior. His current presentation does not appear to be suicidal. He has multiple positive things he very much enjoys about his life and is looking forward to. This appears to of just been an impulsive statement. He no longer meets commitment criteria. IVC discontinued and he can be discharged with no specific mental health follow-up plan  Plan:  Patient does not meet criteria for psychiatric inpatient admission. Supportive therapy provided about ongoing stressors. Disposition: Case discussed with emergency room doctor. IVC discontinued. Supportive counseling to the patient. Discharge from the emergency room without specific mental health follow-up. He Artie has  medical follow-up.  Olita Takeshita 12/25/2014 6:47 PM

## 2014-12-31 ENCOUNTER — Telehealth: Payer: Self-pay | Admitting: Internal Medicine

## 2014-12-31 LAB — ACID FAST SMEAR+CULTURE W/RFLX (ARMC ONLY)
ACID FAST CULTURE: NEGATIVE
ACID FAST SMEAR: NEGATIVE

## 2014-12-31 NOTE — Telephone Encounter (Signed)
Pt called back. He reports he called [employer] and was told they have not received his application for his esbriet. Pt reports he filled this out last week. Please advise Robynn Pane if you still have this? thanks

## 2014-12-31 NOTE — Telephone Encounter (Signed)
[id number] x1 

## 2015-01-01 ENCOUNTER — Telehealth: Payer: Self-pay | Admitting: *Deleted

## 2015-01-01 NOTE — Telephone Encounter (Signed)
Pt wife calling stating that pt is having pains and states he needs to be seen. But pt does not feel like he needs to be seen.  She is just letting us know about all this.  Please advise.

## 2015-01-01 NOTE — Telephone Encounter (Signed)
Pt has the right to refuse medical treatment. Appt made to f/u w/ Dr. Mariah Milling.

## 2015-01-01 NOTE — Telephone Encounter (Addendum)
Form faxed on 7.29.2016. Form refaxed to [employer] on 8.5.2016. Called and made pt's wife aware I will call with an update.

## 2015-01-04 ENCOUNTER — Telehealth (HOSPITAL_COMMUNITY): Payer: Self-pay

## 2015-01-04 NOTE — Telephone Encounter (Signed)
Called patient to discuss Pulmonary Rehab.  Patient states that he lives closer to [facility]. I will fax order over to them. Patient is aware.

## 2015-01-07 NOTE — Telephone Encounter (Signed)
Received form on 8.11.16 to correct the date on the pt assistance form (the date was illegible) . Completed the form and faxed back.

## 2015-01-07 NOTE — Telephone Encounter (Signed)
Robynn Pane please advise if you've received any updates.  Thanks!

## 2015-01-08 ENCOUNTER — Telehealth: Payer: Self-pay | Admitting: *Deleted

## 2015-01-08 NOTE — Telephone Encounter (Signed)
PA completed (see phone note from 8.12.16). Will await a decision.

## 2015-01-08 NOTE — Telephone Encounter (Signed)
Called for PA on pt esbriet [phone] This went into clinical review and will receive a notice in 72 hrs Ref # [id number]

## 2015-01-11 NOTE — Telephone Encounter (Signed)
Robynn Pane - have you received decision for this yet?

## 2015-01-12 NOTE — Telephone Encounter (Signed)
lmtcb for pt. Received notification from [employer] stating the "order has been closed per pt's request". Will need to inquire if this is the pt's wishes.

## 2015-01-12 NOTE — Telephone Encounter (Signed)
Esbriet approved. Please see phone from 8.4.2016. Nothing further needed.

## 2015-01-12 NOTE — Telephone Encounter (Signed)
Received fax stating the pt's Esbriet has been approved until 01/08/17. Called and spoke to pt and informed him of the approval of Esbriet and he will be contacted to schedule a shipment delivery date, pt stated he has already been contacted. Pt's significant other took phone and stated she would like a second opnion and then the line disconnected. Called pt back and asked to speak with pt and the line was disconnected again. WCB to try and speak with pt.

## 2015-01-13 ENCOUNTER — Telehealth: Payer: Self-pay | Admitting: Internal Medicine

## 2015-01-13 ENCOUNTER — Telehealth: Payer: Self-pay
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
## 2015-01-13 NOTE — Telephone Encounter (Signed)
Per earlier message: 01/13/2015 10:08 AM Phone (Incoming) Toney Reil (Emergency Contact) [phone] (H)    pt calling wanting to speak to dr/nurse about a second opinion not really sure what she was in refrence to please advise   Called and LMTCB x1 for [facility]
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
## 2015-01-13 NOTE — Telephone Encounter (Signed)
Forward to Dr. Gollan 

## 2015-01-13 NOTE — Telephone Encounter (Signed)
See prior phone note on this

## 2015-01-13 NOTE — Telephone Encounter (Signed)
Pt wife called, states Dr. Elder Cyphers pt, after lung biopsy,  he had "2 years to live". She wanted to make Dr. Mariah Milling aware of this. She also wanted Dr. Mariah Milling to be aware that the medication that pt has to be on costs $1000 per month, but the pharmaceutical company has it reduced to $7/month.

## 2015-01-13 NOTE — Telephone Encounter (Signed)
Shane Reid returned call. Stated we have no right to cut on patient. Pt does not eat or sleep since we cut on patient. We have ruined him. The day after he came home from hospital he cut the yard and now he is useless. Pt wife states that she does not trust foreign doctors. She will not go anywhere else.  Shane Reid would like a return call back at [phone]. She needs a nice nurse to explain why we called her earlier since pt does not want anymore help.   Pt has taken the home phone off the hook. No need to call him.   This message was taken by Shane Reid at [facility]- Team Lead

## 2015-01-25 ENCOUNTER — Telehealth: Payer: Self-pay | Admitting: Internal Medicine

## 2015-01-25 NOTE — Telephone Encounter (Signed)
He had nrmal LFT June 2016 and end July 2016. HE can start esbriet and then fu with DR [facility] 02/15/15.  Mungal can ddecide who will do further pulm fu - MR the 2nd opinion., Mungal the 1st opinion or refer to [facility] for 3rd opnioin

## 2015-01-25 NOTE — Telephone Encounter (Signed)
Called # provided. Mrs. Papa answered the phone and reports they are wanting a 2nd opinion. I advised MR was the second opinion. She doesn't understand how pt has been to all of these doctors patient has seen no one ever found anything until he see's [place]. I asked if I could speak with pt and she refused to let me. I then advised I was hanging up and calling pt on alternate. I called pt on home #. Spoke with pt. He did receive his esbriet in the mail and he is wanting to take the medication. He had a paper in the box that stated he needed lab work before starting this medication. He wants to make sure he takes the right steps before starting this. Please advise MR thanks

## 2015-01-25 NOTE — Telephone Encounter (Signed)
Called and spoke to pt. Informed pt of the recs per MR. Pt verbalized understanding and denied any further questions or concerns at this time.  

## 2015-01-26 ENCOUNTER — Emergency Department: Payer: Medicare Other

## 2015-01-26 ENCOUNTER — Telehealth: Payer: Self-pay | Admitting: Internal Medicine

## 2015-01-26 ENCOUNTER — Other Ambulatory Visit: Payer: Self-pay

## 2015-01-26 DIAGNOSIS — I10 Essential (primary) hypertension: Secondary | ICD-10-CM | POA: Insufficient documentation

## 2015-01-26 DIAGNOSIS — Z87891 Personal history of nicotine dependence: Secondary | ICD-10-CM | POA: Insufficient documentation

## 2015-01-26 DIAGNOSIS — R079 Chest pain, unspecified: Secondary | ICD-10-CM | POA: Diagnosis not present

## 2015-01-26 LAB — BASIC METABOLIC PANEL
ANION GAP: 7 (ref 5–15)
BUN: 11 mg/dL (ref 6–20)
CO2: 26 mmol/L (ref 22–32)
Calcium: 9.6 mg/dL (ref 8.9–10.3)
Chloride: 108 mmol/L (ref 101–111)
Creatinine, Ser: 1.37 mg/dL — ABNORMAL HIGH (ref 0.61–1.24)
GFR calc non Af Amer: 51 mL/min — ABNORMAL LOW (ref >60)
GFR, EST AFRICAN AMERICAN: 59 mL/min — AB (ref >60)
Glucose, Bld: 139 mg/dL — ABNORMAL HIGH (ref 65–99)
Potassium: 3.7 mmol/L (ref 3.5–5.1)
SODIUM: 141 mmol/L (ref 135–145)

## 2015-01-26 LAB — CBC
HCT: 40 % (ref 40.0–52.0)
HEMOGLOBIN: 13.3 g/dL (ref 13.0–18.0)
MCH: 28.4 pg (ref 26.0–34.0)
MCHC: 33.2 g/dL (ref 32.0–36.0)
MCV: 85.7 fL (ref 80.0–100.0)
PLATELETS: 255 10*3/uL (ref 150–440)
RBC: 4.66 MIL/uL (ref 4.40–5.90)
RDW: 13.7 % (ref 11.5–14.5)
WBC: 10.9 10*3/uL — AB (ref 3.8–10.6)

## 2015-01-26 LAB — TROPONIN I

## 2015-01-26 NOTE — Telephone Encounter (Signed)
Patient calling to discuss Esbriet. He said that he does not want to take this medication because he already takes a lot of medications and he would have to take 100 pills a week and he doesn't think he can handle that.  He says that he has a heart condition as well and he doesn't think this much medication would be good for his heart.  Dr. Marchelle Gearing - please advise.

## 2015-01-26 NOTE — Telephone Encounter (Signed)
Patient notified. Patient states he has appointment scheduled with Dr. Dema Severin. Nothing further needed.

## 2015-01-26 NOTE — Telephone Encounter (Signed)
Medication will not interfere with his heart but yes he has to take a lot of pills and yes it can cause other side effects. He can hold off taking the meds till he sees  Mungal whowas 1st opinion  Or since I was the one who prescribe and was 2nd opinion you can give him an appt to see me first available. HE can bring his wife with him on this visit since she seems to be upset. But let him/ the wife know that I am a "foreign" doctor even though I  My training is here in [place] and I carry [place] passport (apparently she has some issues against "foreign docs"). If he wants a 3rd opinion we can send him to [facility]

## 2015-01-26 NOTE — ED Notes (Signed)
Patient ambulatory to triage with steady gait, without difficulty or distress noted; pt reports mid CP today, nonradiating; denies any accomp symptoms; st hx card stent

## 2015-01-27 ENCOUNTER — Telehealth: Payer: Self-pay

## 2015-01-27 ENCOUNTER — Emergency Department
Admission: EM | Admit: 2015-01-27 | Discharge: 2015-01-27 | Discharge status: Against Medical Advice | Payer: Medicare Other | Attending: Emergency Medicine | Admitting: Emergency Medicine

## 2015-01-27 NOTE — Telephone Encounter (Signed)
S/w pt wife who states pt "hurting around his chest for 2-3 days and it's getting worse and he can't breathe". Wants pt to be seen in our office today. Pt in the background answering my questions. States he can't sleep. Reports chest pressure 5/10 Denies left arm pain States he has been vomiting for two days and "can't have a bowel movement but just took a suppository and has had one" Wife states they will not go to [facility] and does not have transportation to [facility]. Advised pt wife that if he is experiencing these symptoms, go directly to ED. States they went last night and " they left him sitting out there for 5 hours and took other people in front of him" so they left. Informed pt wife that neither Dr. Mariah Milling or Alycia Rossetti had openings today and can have scheduling call to set up appt. Pt states she wants him to be seen today.  Per Dr. Mariah Milling, pt has IPF and verbal order to send phone note to Dr. Marchelle Gearing for review.  Forwarded as instructed.

## 2015-01-27 NOTE — ED Notes (Signed)
No answer when called from lobby and outside 
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
## 2015-01-27 NOTE — Telephone Encounter (Signed)
Pt wife called, states pt was at the ED with chest pain. STates he is no better this morning.  Pt c/o of Chest Pain: STAT if CP now or developed within 24 hours  1. Are you having CP right now? yes  2. Are you experiencing any other symptoms (ex. SOB, nausea, vomiting, sweating)? Yes, all of those  3. How long have you been experiencing CP? Since last night  4. Is your CP continuous or coming and going? Continuous  5. Have you taken Nitroglycerin? Yes, did not help. ?

## 2015-02-03 ENCOUNTER — Ambulatory Visit: Payer: Medicare Other | Admitting: Adult Health

## 2015-02-15 ENCOUNTER — Encounter: Payer: Self-pay | Admitting: Internal Medicine

## 2015-02-15 ENCOUNTER — Ambulatory Visit (INDEPENDENT_AMBULATORY_CARE_PROVIDER_SITE_OTHER): Payer: Medicare Other | Admitting: Internal Medicine

## 2015-02-15 VITALS — BP 128/70 | HR 69 | Ht 69.0 in | Wt 161.0 lb

## 2015-02-15 DIAGNOSIS — J84112 Idiopathic pulmonary fibrosis: Secondary | ICD-10-CM

## 2015-02-15 DIAGNOSIS — R0602 Shortness of breath: Secondary | ICD-10-CM | POA: Diagnosis not present

## 2015-02-15 DIAGNOSIS — I25118 Atherosclerotic heart disease of native coronary artery with other forms of angina pectoris: Secondary | ICD-10-CM

## 2015-02-15 NOTE — Patient Instructions (Addendum)
Follow up with Dr. Dema Severin in 3 months Follow up with Dr. 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 Marchelle Gearing for this.  After today's visit you have decided to continue follow up with Dr. Marchelle Gearing for your ILD.  - Dr. Marchelle Gearing nurse/CMA will contact you and re-educate you on the administration of your ILD meds.  - you will start Esibert (which you have already received) - cont with avoidance of smoke and any forms of smoking - follow up with GI for your upper endoscopy - keep follow up with Dr. Marchelle Gearing - call our office back to update your records once you get your personal cell phone.

## 2015-02-15 NOTE — Assessment & Plan Note (Signed)
Chronic and stable since last visit  Secondary to ILD, specially UIP.  Plan - Following with ILD specialist -Dr. Marchelle Gearing - will start ILD meds (Esibert) - once on meds will start [facility] pulmonary Rehab

## 2015-02-15 NOTE — Assessment & Plan Note (Signed)
IPF  Plan  - following with Dr. Marchelle Gearing - has received Esibert, will start medication with the next 24-48 hrs - Dr. Marchelle Gearing or his RN/CMA will call and re-educate patient on medication use and administartion.
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
## 2015-02-24 ENCOUNTER — Telehealth: Payer: Self-pay | Admitting: *Deleted

## 2015-02-24 NOTE — Telephone Encounter (Signed)
Pt ex wife calling stating that pt is not taking Esibert anymore. She stated that they found out it was an experimental drug and police came and destoryed them.   They are letting us know and would like to know what to do now since they have stopped it.  Please advise

## 2015-02-24 NOTE — Telephone Encounter (Signed)
Ex-wife and states that pt is not taking Esbriet anymore. States they found it was an experimental drug and they called the police and they came and got it. States pt is having trouble breathing.

## 2015-02-24 NOTE — Telephone Encounter (Signed)
LMOM for ex-wife or pt to call back.

## 2015-02-25 NOTE — Telephone Encounter (Signed)
Pt states he will not be coming back to this office because we are trying to kill him. Informed him and ex-wife that they could do what they felt the need to do.

## 2015-02-25 NOTE — Telephone Encounter (Signed)
Patient has ILD, proven by biopsy. He requires treatment, as outlined by Dr. Marchelle Gearing. His symptoms are related to his ILD. Medical decision making is determined by the patient, and not other family members, unless that family member has been given HCPOA (usually in the form of legal documentation) by patient.  At this point, if the patient does not wish to continue with Esbriet, then he can stop.  This drug, and others, only slow down the progression of the ILD, it is not a cure. Patient and his ex-wife may seek another opinion if they wish to.    Thank you Dr. Dema Severin

## 2015-02-25 NOTE — Telephone Encounter (Signed)
Nothing further needed 

## 2015-02-25 NOTE — Telephone Encounter (Signed)
Thanks for update. FYI: PAtient got a 2nd opinion from me. Moving fwd he cannot engage with me due to events so far including lack of confidence in my opinion . REcommend he see another MD - preferrably [facility], [facility], [facility]. At end of day is his choice what he wants to do   Thanks for update Vishal  Will close message

## 2015-02-25 NOTE — Telephone Encounter (Signed)
Tried to call pt's cell number but it kept ringing with no VM. Called ex-spouses number and LM for her to have the pt to give me a call back. Will await call.

## 2015-02-26 ENCOUNTER — Encounter: Payer: Self-pay | Admitting: *Deleted

## 2015-03-01 ENCOUNTER — Telehealth: Payer: Self-pay | Admitting: Internal Medicine

## 2015-03-01 NOTE — Telephone Encounter (Signed)
Patient dismissed from [facility] by Kalman Shan MD , effective February 26, 2015. Dismissal letter sent out by certified / registered mail.  DAJ  Received signed domestic return receipt verifying delivery of certified letter on March 09, 2015. Article number [id number]

## 2015-03-09 NOTE — Telephone Encounter (Signed)
Also by Dr Dema Severin

## 2015-03-31 ENCOUNTER — Ambulatory Visit: Payer: Medicare Other | Admitting: Internal Medicine

## 2015-04-28 ENCOUNTER — Emergency Department: Payer: Medicare Other

## 2015-04-28 ENCOUNTER — Emergency Department
Admission: EM | Admit: 2015-04-28 | Discharge: 2015-04-28 | Disposition: A | Discharge status: Home / Self Care | Payer: Medicare Other | Attending: Student | Admitting: Student

## 2015-04-28 ENCOUNTER — Encounter: Payer: Self-pay | Admitting: Emergency Medicine

## 2015-04-28 DIAGNOSIS — R05 Cough: Secondary | ICD-10-CM | POA: Diagnosis not present

## 2015-04-28 DIAGNOSIS — Z7902 Long term (current) use of antithrombotics/antiplatelets: Secondary | ICD-10-CM | POA: Diagnosis not present

## 2015-04-28 DIAGNOSIS — Z79899 Other long term (current) drug therapy: Secondary | ICD-10-CM | POA: Insufficient documentation

## 2015-04-28 DIAGNOSIS — I10 Essential (primary) hypertension: Secondary | ICD-10-CM | POA: Diagnosis not present

## 2015-04-28 DIAGNOSIS — R55 Syncope and collapse: Secondary | ICD-10-CM | POA: Diagnosis present

## 2015-04-28 DIAGNOSIS — Z87891 Personal history of nicotine dependence: Secondary | ICD-10-CM | POA: Insufficient documentation

## 2015-04-28 DIAGNOSIS — R42 Dizziness and giddiness: Secondary | ICD-10-CM | POA: Diagnosis not present

## 2015-04-28 DIAGNOSIS — R059 Cough, unspecified: Secondary | ICD-10-CM

## 2015-04-28 LAB — COMPREHENSIVE METABOLIC PANEL
ALK PHOS: 43 U/L (ref 38–126)
ALT: 23 U/L (ref 17–63)
AST: 33 U/L (ref 15–41)
Albumin: 3.8 g/dL (ref 3.5–5.0)
Anion gap: 9 (ref 5–15)
BUN: 16 mg/dL (ref 6–20)
CALCIUM: 9.5 mg/dL (ref 8.9–10.3)
CHLORIDE: 109 mmol/L (ref 101–111)
CO2: 21 mmol/L — AB (ref 22–32)
CREATININE: 1.41 mg/dL — AB (ref 0.61–1.24)
GFR, EST AFRICAN AMERICAN: 57 mL/min — AB (ref >60)
GFR, EST NON AFRICAN AMERICAN: 49 mL/min — AB (ref >60)
Glucose, Bld: 114 mg/dL — ABNORMAL HIGH (ref 65–99)
Potassium: 3.9 mmol/L (ref 3.5–5.1)
Sodium: 139 mmol/L (ref 135–145)
Total Bilirubin: 0.5 mg/dL (ref 0.3–1.2)
Total Protein: 7.4 g/dL (ref 6.5–8.1)

## 2015-04-28 LAB — CBC WITH DIFFERENTIAL/PLATELET
BASOS ABS: 0.1 10*3/uL (ref 0–0.1)
Basophils Relative: 1 %
Eosinophils Absolute: 0.4 10*3/uL (ref 0–0.7)
Eosinophils Relative: 3 %
HCT: 38.2 % — ABNORMAL LOW (ref 40.0–52.0)
HEMOGLOBIN: 13 g/dL (ref 13.0–18.0)
LYMPHS ABS: 1.5 10*3/uL (ref 1.0–3.6)
LYMPHS PCT: 14 %
MCH: 28.7 pg (ref 26.0–34.0)
MCHC: 34.1 g/dL (ref 32.0–36.0)
MCV: 84.2 fL (ref 80.0–100.0)
Monocytes Absolute: 1 10*3/uL (ref 0.2–1.0)
Monocytes Relative: 9 %
NEUTROS ABS: 8 10*3/uL — AB (ref 1.4–6.5)
NEUTROS PCT: 73 %
PLATELETS: 232 10*3/uL (ref 150–440)
RBC: 4.53 MIL/uL (ref 4.40–5.90)
RDW: 13.9 % (ref 11.5–14.5)
WBC: 11 10*3/uL — AB (ref 3.8–10.6)

## 2015-04-28 LAB — URINALYSIS COMPLETE WITH MICROSCOPIC (ARMC ONLY)
Bacteria, UA: NONE SEEN
Bilirubin Urine: NEGATIVE
GLUCOSE, UA: NEGATIVE mg/dL
Ketones, ur: NEGATIVE mg/dL
Leukocytes, UA: NEGATIVE
Nitrite: NEGATIVE
PROTEIN: NEGATIVE mg/dL
SPECIFIC GRAVITY, URINE: 1.017 (ref 1.005–1.030)
SQUAMOUS EPITHELIAL / LPF: NONE SEEN
pH: 5 (ref 5.0–8.0)

## 2015-04-28 LAB — TROPONIN I

## 2015-04-28 MED ORDER — IPRATROPIUM-ALBUTEROL 0.5-2.5 (3) MG/3ML IN SOLN
3.0000 mL | Freq: Once | RESPIRATORY_TRACT | Status: AC
  Administered 2015-04-28: 3 mL via RESPIRATORY_TRACT
  Filled 2015-04-28: qty 3

## 2015-04-28 MED ORDER — SODIUM CHLORIDE 0.9 % IV BOLUS (SEPSIS)
500.0000 mL | Freq: Once | INTRAVENOUS | Status: AC
  Administered 2015-04-28: 500 mL via INTRAVENOUS

## 2015-04-28 MED ORDER — BENZONATATE 100 MG PO CAPS: 100.0000 mg | ORAL_CAPSULE | Freq: Three times a day (TID) | ORAL | Status: DC | PRN

## 2015-04-28 NOTE — ED Notes (Signed)
Pts wife called [employer] and reports pt has had "pneumonia shot and I wanted you all to know."

## 2015-04-28 NOTE — ED Notes (Signed)
MD at bedside. 

## 2015-04-28 NOTE — ED Provider Notes (Signed)
[facility] Emergency Department Provider Note  ____________________________________________  Time seen: Approximately 7:50 PM  I have reviewed the triage vital signs and the nursing notes.   HISTORY  Chief Complaint Loss of Consciousness    HPI Shane Schlotter. is a 69 y.o. male history of coronary artery disease, COPD/pulmonary fibrosis, hypertension, and hyperlipidemia who presents for evaluation of a syncopal episode which occurred suddenly this evening just prior to arrival, gradual onset, currently resolved, no modifying factors. Patient reports that he has been ill with runny nose and nonproductive cough for the past several days, no fever. He reports that he has been coughing quite a bit today coughed so hard that he rolled out of bed. He did not hit his head. He got up to walk to the kitchen to get a bottle of water and began feeling lightheaded and fainted however his family member caught him he did not injure himself. There is no chest pain or shortness of breath that preceded this, no palpitations. No vomiting, diarrhea, fevers or chills. He has had problems with lightheadedness and fainting in the past.   Past Medical History  Diagnosis Date  . Anxiety   . Asthma   . Depression   . GERD (gastroesophageal reflux disease)   . Hypercholesteremia   . Bradycardia   . Coronary artery disease     s/p stent 2008  . Other activity(E029.9)     Social stressor, wife with mental illness  . Anginal pain (HCC)   . Hypertension   . Heart murmur   . COPD (chronic obstructive pulmonary disease) [facility])     Patient Active Problem List   Diagnosis Date Noted  . 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 IPF (idiopathic pulmonary fibrosis) (HCC) 12/23/2014  . Pre-operative cardiovascular examination 11/05/2014  . ILD (interstitial lung disease) (HCC) 09/04/2014  . Interstitial lung disease (HCC) 06/22/2014  . Upper  respiratory infection 12/03/2013  . CAROTID BRUIT, LEFT 05/12/2010  . Hyperlipidemia 07/01/2009  . CAD, NATIVE VESSEL 07/01/2009  . SHORTNESS OF BREATH 07/01/2009  . Atypical chest pain 07/01/2009    Past Surgical History  Procedure Laterality Date  . Cardiac catheterization    . Testicle surgery    . Esophagus stretch    . Coronary angioplasty      stents times 4  . Eye surgery    . Video assisted thoracoscopy (vats)/thorocotomy Right 11/18/2014    Procedure: VIDEO ASSISTED THORACOSCOPY (VATS)/ wedge resection biopsy;  Surgeon: Hulda Marin, MD;  Location: [facility] ORS;  Service: General;  Laterality: Right;  . Lung biopsy      Current Outpatient Rx  Name  Route  Sig  Dispense  Refill  . albuterol (PROVENTIL) (2.5 MG/3ML) 0.083% nebulizer solution   Nebulization   Take 3 mLs (2.5 mg total) by nebulization every 6 (six) hours as needed.   75 mL   11   . amLODipine-benazepril (LOTREL) 5-10 MG per capsule   Oral   Take 1 capsule by mouth daily.   90 capsule   3   . Choline Fenofibrate (FENOFIBRIC ACID) 135 MG CPDR   Oral   Take 1 capsule by mouth daily.   30 capsule   3   . clopidogrel (PLAVIX) 75 MG tablet      TAKE ONE (1) TABLET BY MOUTH EVERY DAY Patient taking differently: Take 75 mg by mouth daily.    30 tablet   3   . nitroGLYCERIN (NITROSTAT)  0.4 MG SL tablet   Sublingual   Place 1 tablet (0.4 mg total) under the tongue every 5 (five) minutes as needed.   25 tablet   6   . oxyCODONE-acetaminophen (PERCOCET/ROXICET) 5-325 MG per tablet   Oral   Take 1 tablet by mouth every 4 (four) hours as needed for severe pain.         . rosuvastatin (CRESTOR) 10 MG tablet      TAKE ONE (1) TABLET BY MOUTH EVERY DAY Patient taking differently: Take 10 mg by mouth daily.    30 tablet   3     Allergies Metoprolol succinate; Niacin; and Sertraline hcl  Family History  Problem Relation Age of Onset  . Coronary artery disease Mother     s/p CABG  . Heart  failure Mother   . Heart disease Other   . Depression Other     Social History Social History  Substance Use Topics  . Smoking status: Former Smoker -- 0.50 packs/day for 1 years    Types: Cigarettes    Quit date: 05/09/1968  . Smokeless tobacco: Never Used  . Alcohol Use: No    Review of Systems Constitutional: No fever/chills Eyes: No visual changes. ENT: No sore throat. Cardiovascular: Denies chest pain. Respiratory: Denies shortness of breath. Gastrointestinal: No abdominal pain.  No nausea, no vomiting.  No diarrhea.  No constipation. Genitourinary: Negative for dysuria. Musculoskeletal: Negative for back pain. Skin: Negative for rash. Neurological: Negative for headaches, focal weakness or numbness.  10-point ROS otherwise negative.  ____________________________________________   PHYSICAL EXAM:  Filed Vitals:   04/28/15 2100 04/28/15 2130 04/28/15 2200 04/28/15 2236  BP: 134/73 125/68 114/69   Pulse: 74 75 72   Temp:    98.2 F (36.8 C)  TempSrc:    Oral  Resp:  22    SpO2: 97% 92% 92%      Constitutional: Alert and oriented. Well appearing and in no acute distress. frequent dry cough.  Eyes: Conjunctivae are normal. PERRL. EOMI. Head: Atraumatic. Nose: No congestion/rhinnorhea. Mouth/Throat: Mucous membranes are moist.  Oropharynx non-erythematous. Neck: No stridor.   Cardiovascular: Normal rate, regular rhythm. Grossly normal heart sounds.  Good peripheral circulation. Respiratory: Normal respiratory effort.  No retractions. Rhonchi bilateral bases. Gastrointestinal: Soft and nontender. No distention. No abdominal bruits. No CVA tenderness. Genitourinary: deferred Musculoskeletal: No lower extremity tenderness nor edema.  No joint effusions. Neurologic:  Normal speech and language. No gross focal neurologic deficits are appreciated. No gait instability. Skin:  Skin is warm, dry and intact. No rash noted. Psychiatric: Mood and affect are normal.  Speech and behavior are normal.  ____________________________________________   LABS (all labs ordered are listed, but only abnormal results are displayed)  Labs Reviewed  CBC WITH DIFFERENTIAL/PLATELET - Abnormal; Notable for the following:    WBC 11.0 (*)    HCT 38.2 (*)    Neutro Abs 8.0 (*)    All other components within normal limits  COMPREHENSIVE METABOLIC PANEL - Abnormal; Notable for the following:    CO2 21 (*)    Glucose, Bld 114 (*)    Creatinine, Ser 1.41 (*)    GFR calc non Af Amer 49 (*)    GFR calc Af Amer 57 (*)    All other components within normal limits  URINALYSIS COMPLETEWITH MICROSCOPIC ([facility] ONLY) - Abnormal; Notable for the following:    Color, Urine YELLOW (*)    APPearance CLEAR (*)    Hgb urine dipstick 1+ (*)  All other components within normal limits  TROPONIN I   ____________________________________________  EKG  ED ECG REPORT I, [name], the attending physician, personally viewed and interpreted this ECG.   Date: 04/28/2015  EKG Time: 19:57  Rate: 73  Rhythm: normal sinus rhythm  Axis: normal  Intervals:none  ST&T Change: No acute ST elevation. LVH.  ____________________________________________  RADIOLOGY  CXR IMPRESSION: No active disease.  CT head IMPRESSION: Stable atrophy without acute intracranial process by noncontrast CT.  Mild chronic sinus disease. ____________________________________________   PROCEDURES  Procedure(s) performed: None  Critical Care performed: No  ____________________________________________   INITIAL IMPRESSION / ASSESSMENT AND PLAN / ED COURSE  Pertinent labs & imaging results that were available during my care of the patient were reviewed by me and considered in my medical decision making (see chart for details).  *Shane Anfinson. is a 69 y.o. male history of coronary artery disease, COPD/pulmonary fibrosis, hypertension, and hyperlipidemia who presents for evaluation of  a syncopal episode which occurred suddenly this evening just prior to arrival, gradual onset, currently resolved, no modifying factors.ON EXAM, HE IS VERY WELL-APPEARING AND IN NO ACUTE DISTRESS. VITAL SIGNS STABLE. CURRENTLY HE IS ASYMPTOMATIC AND REPORTED HE FEELS WELL WITH THE EXCEPTION OF COUGH. HE HAS RHONCHOROUS BREATH SOUNDS IN BILATERAL BASES HOWEVER SUSPECT THIS IS CHRONIC. NO AGREES WORK OF BREATHING, NO HYPOXIA. WE'LL GIVE DUONEB TREATMENT. SUSPECT HIS SYMPTOMS TODAY ARE SECONDARY TO VASOVAGAL SYNCOPE GIVEN SIGNIFICANT PRODROME. HE MAY BE MILDLY DEHYDRATED IN THE SETTING OF A VIRAL ILLNESS. EKG IS REASSURING, DOUBT CARDIOGENIC SYNCOPE. INTACT NEUROLOGICAL EXAMINATION, DOUBT PURELY NEUROGENIC SYNCOPE. PLAN FOR SCREENING LABS, CHEST X-RAY, CT HEAD, URINALYSIS, WILL GIVE LIGHT IV FLUIDS. REASSESS FOR DISPOSITION.   ----------------------------------------- 10:49 PM on 04/28/2015 -----------------------------------------  She reports he feels well at this time with the exception of his continued cough. He reports DuoNeb treatment did help briefly but his cough returned. Labs reviewed. Mild leukocytosis with white blood cell count 11,000 however this appears chronic on chart review. CMP notable for mild creatinine elevation with creatinine 1.41. This also appears to be his baseline. Troponin negative. Urinalysis not consistent with urinary tract infection. Chest x-ray negative. CT head shows no acute cardio process. Discussed return precautions, need for close PCP follow-up and the patient is, trouble with discharge. We'll DC with Tessalon Perles for cough. ____________________________________________   FINAL CLINICAL IMPRESSION(S) / ED DIAGNOSES  Final diagnoses:  Syncope, unspecified syncope type  Cough      [name], MD 04/28/15 2256

## 2015-04-28 NOTE — ED Notes (Signed)
Pt comes into the ED via EMS from home c/o syncopal episode.  Patient denies hitting his head and was coughing right before it occurred.  Patient has h/o pulmonary fibrosis.  CBG 121. Patient in no apparent distress at this time.  A&Ox4.  Patient originally declined coming but was convinced by family.

## 2015-04-28 NOTE — ED Notes (Signed)
Patient transported to CT 

## 2015-04-28 NOTE — ED Notes (Signed)
Patient returned from radiology

## 2015-05-12 ENCOUNTER — Telehealth: Payer: Self-pay | Admitting: Cardiovascular Disease

## 2015-05-12 MED ORDER — NITROGLYCERIN 0.4 MG SL SUBL: 0.4000 mg | SUBLINGUAL_TABLET | SUBLINGUAL | Status: DC | PRN

## 2015-05-12 MED ORDER — ROSUVASTATIN CALCIUM 10 MG PO TABS: 10.0000 mg | ORAL_TABLET | Freq: Every day | ORAL | Status: DC

## 2015-05-12 MED ORDER — AMLODIPINE BESY-BENAZEPRIL HCL 5-10 MG PO CAPS: 1.0000 | ORAL_CAPSULE | Freq: Every day | ORAL | Status: DC

## 2015-05-12 NOTE — Telephone Encounter (Signed)
Rx have been sent to local pharmacy by RN.

## 2015-05-12 NOTE — Telephone Encounter (Signed)
°*  STAT* If patient is at the pharmacy, call can be transferred to refill team.   1. Which medications need to be refilled? (please list name of each medication and dose if known) ALL OF THEM patient refused to tell me specifics  2. Which pharmacy/location (including street and city if local pharmacy) is medication to be sent to? Not sure patient did not want to come to phone had family relay msg  3. Do they need a 30 day or 90 day supply? 90

## 2015-05-12 NOTE — Telephone Encounter (Signed)
Spoke w/ Byrd Hesselbach.  She reports that Mccray needs refills on all of his meds, but he will not come out of his room to speak w/ me.   Advised her that I am sending refills for him on his cardiac meds.  Pt has appt w/ Eula Listen, PA on 06/14/14, but she reports that pt will most likely not come to this appt, as he will only see Dr. Mariah Milling.  Asked her about pt's chest pain, she reports that he is not having any.  She states that pt will no longer be seeing "that fellow across the hall gave him some medicine that cost $10,000 and we looked it up, it's a scam and he called the police on them". She asks that I mail her an appt for pt to see Dr. Mariah Milling when they can both come in at the same time, as they want to save gas money.

## 2015-05-12 NOTE — Telephone Encounter (Signed)
Pt c/o of Chest Pain: STAT if CP now or developed within 24 hours  1. Are you having CP right now?   2. Are you experiencing any other symptoms (ex. SOB, nausea, vomiting, sweating)?   3. How long have you been experiencing CP?   4. Is your CP continuous or coming and going?   5. Have you taken Nitroglycerin?   PER MARIA PATIENT HAVING BAD CHEST PAIN BUT WILL NOT COME TO PHONE TO ANSWER QUESTIONS ?

## 2015-06-09 ENCOUNTER — Ambulatory Visit (INDEPENDENT_AMBULATORY_CARE_PROVIDER_SITE_OTHER): Payer: Medicare Other | Admitting: Nurse Practitioner

## 2015-06-09 ENCOUNTER — Encounter: Payer: Self-pay | Admitting: Nurse Practitioner

## 2015-06-09 VITALS — BP 130/80 | HR 60 | Ht 69.0 in | Wt 169.0 lb

## 2015-06-09 DIAGNOSIS — I2510 Atherosclerotic heart disease of native coronary artery without angina pectoris: Secondary | ICD-10-CM | POA: Diagnosis not present

## 2015-06-09 DIAGNOSIS — R0789 Other chest pain: Secondary | ICD-10-CM | POA: Diagnosis not present

## 2015-06-09 DIAGNOSIS — I119 Hypertensive heart disease without heart failure: Secondary | ICD-10-CM

## 2015-06-09 DIAGNOSIS — R079 Chest pain, unspecified: Secondary | ICD-10-CM | POA: Insufficient documentation

## 2015-06-09 DIAGNOSIS — I259 Chronic ischemic heart disease, unspecified: Secondary | ICD-10-CM | POA: Insufficient documentation

## 2015-06-09 DIAGNOSIS — E7800 Pure hypercholesterolemia, unspecified: Secondary | ICD-10-CM

## 2015-06-09 NOTE — Patient Instructions (Signed)
Medication Instructions:  Your physician recommends that you continue on your current medications as directed. Please refer to the Current Medication list given to you today.   Labwork: none  Testing/Procedures: none  Follow-Up: Your physician wants you to follow-up in: six months with Dr. Gollan. You will receive a reminder letter in the mail two months in advance. If you don't receive a letter, please call our office to schedule the follow-up appointment.   Any Other Special Instructions Will Be Listed Below (If Applicable).     If you need a refill on your cardiac medications before your next appointment, please call your pharmacy.   

## 2015-06-09 NOTE — Progress Notes (Signed)
Office Visit    Patient Name: Shane Reid. Date of Encounter: 06/09/2015  Primary Care Provider:  Mickel Fuchs, MD Primary Cardiologist:  Concha Se, MD   Chief Complaint    70 year old male with a history of CAD status post relatively remote stenting, and chronic chest pain, who presents for follow-up.  Past Medical History    Past Medical History  Diagnosis Date  . Anxiety   . Asthma   . Depression   . GERD (gastroesophageal reflux disease)   . Hypercholesteremia   . Bradycardia   . Coronary artery disease     a. 2008 s/p PCI to LAD;  b. 2013 Cath: patent stent->Med Rx;  c. 11/2013 MV: EF 67%, no ischemia/infarct.  . Other social stressor     a. wife with mental illness  . Chronic Chest Pain   . Hypertensive heart disease   . Heart murmur     a. 02/2010 Echo: EF 55-60%, no rwma, Gr 2 DD, triv MR, mildly dil LA, nl RV.  [name] COPD (chronic obstructive pulmonary disease) (HCC)   . Idiopathic pulmonary fibrosis (HCC)     a. Seen by Dr. Dema Severin 01/2015 and Rx Esbriet - pt cannot afford.   Past Surgical History  Procedure Laterality Date  . Cardiac catheterization    . Testicle surgery    . Esophagus stretch    . Coronary angioplasty      stents times 4  . Eye surgery    . Video assisted thoracoscopy (vats)/thorocotomy Right 11/18/2014    Procedure: VIDEO ASSISTED THORACOSCOPY (VATS)/ wedge resection biopsy;  Surgeon: Hulda Marin, MD;  Location: [facility] ORS;  Service: General;  Laterality: Right;  . Lung biopsy      Allergies  Allergies  Allergen Reactions  . Metoprolol Succinate Other (See Comments)    Reaction:  Unknown   . Niacin Other (See Comments)    Reaction:  Unknown   . Sertraline Hcl Other (See Comments)    Reaction:  Unknown     History of Present Illness    70 year old male with the above complex past medical history. He is status post PCI of the LAD in 2008 with patency of that stent noted in 2013, followed by a nonischemic Myoview in July  2015. He also has a history of chronic chest pain, hypertension, hyperlipidemia, and idiopathic pulmonary fibrosis. He has been evaluated by pulmonology with recommendation to initiate Esbriet however patient says that it is too expensive and has too many side effects and refuses to take it. In that setting, he does have some chronic dyspnea on exertion though this appears to be relatively stable. He has not had any chest pain recently. He denies PND, orthopnea, dizziness, syncope, edema, palpitations, or early satiety.  Home Medications    Prior to Admission medications   Medication Sig Start Date End Date Taking? Authorizing Provider  albuterol (PROVENTIL) (2.5 MG/3ML) 0.083% nebulizer solution Take 3 mLs (2.5 mg total) by nebulization every 6 (six) hours as needed. 05/02/12  Yes Antonieta Iba, MD  amLODipine-benazepril (LOTREL) 5-10 MG capsule Take 1 capsule by mouth daily. 05/12/15  Yes Antonieta Iba, MD  benzonatate (TESSALON PERLES) 100 MG capsule Take 1 capsule (100 mg total) by mouth 3 (three) times daily as needed for cough. 04/28/15 04/27/16 Yes [name], MD  Choline Fenofibrate (FENOFIBRIC ACID) 135 MG CPDR Take 1 capsule by mouth daily. 12/15/14  Yes Antonieta Iba, MD  clopidogrel (PLAVIX) 75 MG  tablet TAKE ONE (1) TABLET BY MOUTH EVERY DAY Patient taking differently: Take 75 mg by mouth daily.  12/15/14  Yes Antonieta Iba, MD  nitroGLYCERIN (NITROSTAT) 0.4 MG SL tablet Place 1 tablet (0.4 mg total) under the tongue every 5 (five) minutes as needed. 05/12/15  Yes Antonieta Iba, MD  rosuvastatin (CRESTOR) 10 MG tablet Take 1 tablet (10 mg total) by mouth daily. 05/12/15  Yes Antonieta Iba, MD  oxyCODONE-acetaminophen (PERCOCET/ROXICET) 5-325 MG per tablet Take 1 tablet by mouth every 4 (four) hours as needed for severe pain. Reported on 06/09/2015    Historical Provider, MD  Aspirin 81 mg daily  Review of Systems    Chronic dyspnea on exertion, which appears to be  stable. He denies chest pain, palpitations, PND, orthopnea, dizziness, syncopal, edema, or early satiety. He reports compliance with all medications.  All other systems reviewed and are otherwise negative except as noted above.  Physical Exam    VS:  BP 130/80 mmHg  Pulse 60  Ht 5\' 9"  (1.753 m)  Wt 169 lb (76.658 kg)  BMI 24.95 kg/m2 , BMI Body mass index is 24.95 kg/(m^2). GEN: Well nourished, well developed, in no acute distress. HEENT: normal. Neck: Supple, no JVD, carotid bruits, or masses. Cardiac: RRR, no murmurs, rubs, or gallops. No clubbing, cyanosis, edema.  Radials/DP/PT 2+ and equal bilaterally.  Respiratory:  Respirations regular and unlabored, mildly diminished in the bases but otherwise clear to auscultation bilaterally. GI: Soft, nontender, nondistended, BS + x 4. MS: no deformity or atrophy. Skin: warm and dry, no rash. Neuro:  Strength and sensation are intact. Psych: Normal affect.  Accessory Clinical Findings    ECG - regular sinus rhythm, 60, LVH, no acute ST or T changes.  Assessment & Plan    1.  Coronary artery disease: Patient is doing well from this standpoint. He has not had any recent chest pain and says overall he is doing well. He has some degree of chronic dyspnea on exertion, especially when hauling wood in and around his yard, but he treats this to his pulmonary fibrosis and says that overall this is stable. He remains on aspirin, Plavix, statin, and when necessary nitrate therapy. He has not required any when necessary nitrates recently.  2. Hypertensive heart disease: Blood pressure stable today on calcium channel blocker therapy.  3. Hyperlipidemia: Followed by primary care. Last LDL that we have on file was 29 in December 2011.  Nl LFT's in November.  4.  Pulmonary Fibrosis:  Followed by pulmonology.  Prev Rx Esbriet but he chooses not to take it - saying that he can't afford it and it has too many side effects.  He has chronic DOE, but this  appears to be stable.   Nicolasa Ducking, NP 06/09/2015, 9:14 AM

## 2015-06-15 ENCOUNTER — Ambulatory Visit: Payer: Medicare Other | Admitting: Physician Assistant

## 2015-08-03 ENCOUNTER — Telehealth: Payer: Self-pay | Admitting: Cardiovascular Disease

## 2015-08-03 NOTE — Telephone Encounter (Signed)
Spoke w/ Byrd Hesselbach.  She reports that Shane Reid passed out in the yard last night around 2 am. "He had a green bowel movement and we haven't eaten anything green". She states that she needs him to be well, as they have a wood stove and he's the only one that can bring the wood up to the porch.  Offered pt appt to see Ward Givens, NP today @ 1:30, but pt can be heard in the background yelling that he is fine and is not going anywhere. She states that our office "tells lies", as she have not been "mailed an appt" for her continued chest pain. Offered her the appt w/ Thayer Ohm today, but she declines, as she has the flu and does not want to get out of bed.  Pt is yelling in the background that he does not want to come in, that he does not approve of her calling our office. She tells him to go to the ED, but he uses profanity to tell her that he will not. She states that she is afraid that she will find him passed out again and wants [place] to do something.  She whispers into the phone and requests that I have Dr. Mariah Milling "come out here"; advised her that we cannot do that.  Advised her that pt does have the right to refuse treatment.  Advised her that I will make Dr. Mariah Milling aware of pt's situation and call if he has any recommendations, to which she replied "bye" and hung up.

## 2015-08-03 NOTE — Telephone Encounter (Signed)
Pt wife called states he has been "passing out in the yard", states he is hurting in his chest, and has taken 2 nitro this moring at 2 am. No relief.

## 2015-09-21 DIAGNOSIS — R1312 Dysphagia, oropharyngeal phase: Secondary | ICD-10-CM | POA: Insufficient documentation

## 2015-09-22 ENCOUNTER — Other Ambulatory Visit: Payer: Self-pay | Admitting: Cardiovascular Disease

## 2015-09-28 ENCOUNTER — Telehealth: Payer: Self-pay | Admitting: Cardiovascular Disease

## 2015-09-28 NOTE — Telephone Encounter (Signed)
Okay to hold Plavix 5 days prior to procedure, would restart after

## 2015-09-28 NOTE — Telephone Encounter (Signed)
Received cardiac clearance request for pt to proceed w/ EGD due to dysphagia. [facility] is also requesting Dr. Windell Hummingbird recommendation for holding Plavix prior to procedure, if necessary. Placed on Dr. Windell Hummingbird desk for review.  Fax back to Triage Nurse [phone]

## 2015-09-29 NOTE — Telephone Encounter (Signed)
Cardiac clearance form with cover sheet faxed to [facility]. Original forms placed in "Faxed" bin on Pepper Wyndham's desk.

## 2015-09-29 NOTE — Telephone Encounter (Signed)
Left detailed message on voicemail letting patient know that cardiac clearance form had been signed and faxed over to [facility] and to call back if any questions.

## 2015-10-29 ENCOUNTER — Encounter: Payer: Self-pay | Admitting: Emergency Medicine

## 2015-10-29 ENCOUNTER — Emergency Department
Admission: EM | Admit: 2015-10-29 | Discharge: 2015-10-29 | Disposition: A | Discharge status: Home / Self Care | Payer: Medicare Other | Attending: Emergency Medicine | Admitting: Emergency Medicine

## 2015-10-29 ENCOUNTER — Emergency Department: Payer: Medicare Other

## 2015-10-29 DIAGNOSIS — R1013 Epigastric pain: Secondary | ICD-10-CM | POA: Diagnosis present

## 2015-10-29 DIAGNOSIS — I2510 Atherosclerotic heart disease of native coronary artery without angina pectoris: Secondary | ICD-10-CM | POA: Insufficient documentation

## 2015-10-29 DIAGNOSIS — I110 Hypertensive heart disease with heart failure: Secondary | ICD-10-CM | POA: Diagnosis not present

## 2015-10-29 DIAGNOSIS — Z87891 Personal history of nicotine dependence: Secondary | ICD-10-CM | POA: Insufficient documentation

## 2015-10-29 DIAGNOSIS — I509 Heart failure, unspecified: Secondary | ICD-10-CM | POA: Insufficient documentation

## 2015-10-29 DIAGNOSIS — F329 Major depressive disorder, single episode, unspecified: Secondary | ICD-10-CM | POA: Diagnosis not present

## 2015-10-29 DIAGNOSIS — K209 Esophagitis, unspecified without bleeding: Secondary | ICD-10-CM

## 2015-10-29 DIAGNOSIS — E785 Hyperlipidemia, unspecified: Secondary | ICD-10-CM | POA: Insufficient documentation

## 2015-10-29 DIAGNOSIS — R0789 Other chest pain: Secondary | ICD-10-CM | POA: Diagnosis not present

## 2015-10-29 DIAGNOSIS — J449 Chronic obstructive pulmonary disease, unspecified: Secondary | ICD-10-CM | POA: Insufficient documentation

## 2015-10-29 DIAGNOSIS — J45909 Unspecified asthma, uncomplicated: Secondary | ICD-10-CM | POA: Insufficient documentation

## 2015-10-29 HISTORY — DX: Heart failure, unspecified: I50.9

## 2015-10-29 LAB — HEPATIC FUNCTION PANEL
ALBUMIN: 4 g/dL (ref 3.5–5.0)
ALT: 39 U/L (ref 17–63)
AST: 42 U/L — AB (ref 15–41)
Alkaline Phosphatase: 41 U/L (ref 38–126)
BILIRUBIN DIRECT: 0.1 mg/dL (ref 0.1–0.5)
BILIRUBIN TOTAL: 0.8 mg/dL (ref 0.3–1.2)
Indirect Bilirubin: 0.7 mg/dL (ref 0.3–0.9)
Total Protein: 7.4 g/dL (ref 6.5–8.1)

## 2015-10-29 LAB — BASIC METABOLIC PANEL
ANION GAP: 12 (ref 5–15)
BUN: 11 mg/dL (ref 6–20)
CALCIUM: 9.2 mg/dL (ref 8.9–10.3)
CO2: 19 mmol/L — AB (ref 22–32)
CREATININE: 1.32 mg/dL — AB (ref 0.61–1.24)
Chloride: 106 mmol/L (ref 101–111)
GFR, EST NON AFRICAN AMERICAN: 53 mL/min — AB (ref >60)
Glucose, Bld: 185 mg/dL — ABNORMAL HIGH (ref 65–99)
Potassium: 4 mmol/L (ref 3.5–5.1)
Sodium: 137 mmol/L (ref 135–145)

## 2015-10-29 LAB — CBC
HCT: 39 % — ABNORMAL LOW (ref 40.0–52.0)
HEMOGLOBIN: 13.3 g/dL (ref 13.0–18.0)
MCH: 28.3 pg (ref 26.0–34.0)
MCHC: 34.1 g/dL (ref 32.0–36.0)
MCV: 83 fL (ref 80.0–100.0)
PLATELETS: 249 10*3/uL (ref 150–440)
RBC: 4.7 MIL/uL (ref 4.40–5.90)
RDW: 13.5 % (ref 11.5–14.5)
WBC: 12.2 10*3/uL — ABNORMAL HIGH (ref 3.8–10.6)

## 2015-10-29 LAB — LIPASE, BLOOD: LIPASE: 22 U/L (ref 11–51)

## 2015-10-29 LAB — TROPONIN I

## 2015-10-29 MED ORDER — IOPAMIDOL (ISOVUE-300) INJECTION 61%
100.0000 mL | Freq: Once | INTRAVENOUS | Status: AC | PRN
  Administered 2015-10-29: 100 mL via INTRAVENOUS

## 2015-10-29 MED ORDER — ALUM & MAG HYDROXIDE-SIMETH 200-200-20 MG/5ML PO SUSP
30.0000 mL | Freq: Once | ORAL | Status: AC
  Administered 2015-10-29: 30 mL via ORAL
  Filled 2015-10-29: qty 30

## 2015-10-29 MED ORDER — DIATRIZOATE MEGLUMINE & SODIUM 66-10 % PO SOLN
15.0000 mL | Freq: Once | ORAL | Status: AC
  Administered 2015-10-29: 15 mL via ORAL

## 2015-10-29 NOTE — ED Notes (Signed)
Pt's wife called requesting update.  W/ pt's verbal consent, wife given update.  Wife request to be called when more information available about when pt will be discharged.  Best number for wife, Hilda Lias [phone]

## 2015-10-29 NOTE — ED Notes (Signed)
Pt presents to ED with c/o chest pain, onset last night about 2300. Pt [facility]. diaphoresis noted. Pt states he was told to stopped plavix ASA temporarily since 10/21/15 and started taking yesterday. Pt alerts and oriented x4 at this time.

## 2015-10-29 NOTE — ED Provider Notes (Signed)
[facility] Emergency Department Provider Note  ____________________________________________  Time seen: Approximately 5:16 AM  I have reviewed the triage vital signs and the nursing notes.   HISTORY  Chief Complaint Chest Pain    HPI Shane Reid. is a 70 y.o. male presents for evaluation of upper abdominal pain, also lower chest pain. Patient reports that about 11 PM he had had something to eat, and had iced tea and suddenly started developing discomfort in the upper abdomen as he describes a sharp stabbing pain. This then radiated up into his lower chest and continues on as a rather sharp stabbing discomfort.  He reports that he took 3 nitroglycerin at home with no relief, as he does have a history of previous cardiac stents. He denies heavy chest pain or pressure or radiation. He has had some nausea but reports this is been persistent since the time of having a procedure done yesterday for which she had an esophageal dilation. Pain reported as moderate to severe. No change since starting at about 11  He has not had any fevers or chills. He denies any shortness of breath but does tell me he has a history of pulmonary fibrosis.  No pain in the lower abdomen or back. Reports primarily very sharp local pain just underneath the lower portion of the breast bone upper stomach. He is not sure but thinks his esophagus might be inflamed or causing pain.  Past Medical History  Diagnosis Date  . Anxiety   . Asthma   . Depression   . GERD (gastroesophageal reflux disease)   . Hypercholesteremia   . Bradycardia   . Coronary artery disease     a. 2008 s/p PCI to LAD;  b. 2013 Cath: patent stent->Med Rx;  c. 11/2013 MV: EF 67%, no ischemia/infarct.  . Other social stressor     a. wife with mental illness  . Chronic Chest Pain   . Hypertensive heart disease   . Heart murmur     a. 02/2010 Echo: EF 55-60%, no rwma, Gr 2 DD, triv MR, mildly dil LA, nl RV.  [name]  COPD (chronic obstructive pulmonary disease) (HCC)   . Idiopathic pulmonary fibrosis (HCC)     a. Seen by Dr. Dema Severin 01/2015 and Rx Esbriet - pt cannot afford.  . CHF (congestive heart failure) (HCC)   . Hypertension     Patient Active Problem List   Diagnosis Date Noted  . Hypertensive heart disease   . Chronic Chest Pain   . Coronary artery disease   . Hypercholesteremia   . 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 IPF (idiopathic pulmonary fibrosis) (HCC) 12/23/2014  . Pre-operative cardiovascular examination 11/05/2014  . ILD (interstitial lung disease) (HCC) 09/04/2014  . Interstitial lung disease (HCC) 06/22/2014  . Upper respiratory infection 12/03/2013  . CAROTID BRUIT, LEFT 05/12/2010  . Hyperlipidemia 07/01/2009  . CAD, NATIVE VESSEL 07/01/2009  . SHORTNESS OF BREATH 07/01/2009  . Atypical chest pain 07/01/2009    Past Surgical History  Procedure Laterality Date  . Cardiac catheterization    . Testicle surgery    . Esophagus stretch    . Coronary angioplasty      stents times 4  . Eye surgery    . Video assisted thoracoscopy (vats)/thorocotomy Right 11/18/2014    Procedure: VIDEO ASSISTED THORACOSCOPY (VATS)/ wedge resection biopsy;  Surgeon: Hulda Marin, MD;  Location: [facility] ORS;  Service: General;  Laterality: Right;  . Lung biopsy  Current Outpatient Rx  Name  Route  Sig  Dispense  Refill  . albuterol (PROVENTIL) (2.5 MG/3ML) 0.083% nebulizer solution   Nebulization   Take 3 mLs (2.5 mg total) by nebulization every 6 (six) hours as needed.   75 mL   11   . amLODipine-benazepril (LOTREL) 5-10 MG capsule   Oral   Take 1 capsule by mouth daily.   90 capsule   3   . benzonatate (TESSALON PERLES) 100 MG capsule   Oral   Take 1 capsule (100 mg total) by mouth 3 (three) times daily as needed for cough.   12 capsule   0   . Choline Fenofibrate (FENOFIBRIC ACID) 135 MG CPDR   Oral   Take 1 capsule by mouth  daily.   30 capsule   3   . Choline Fenofibrate (FENOFIBRIC ACID) 135 MG CPDR      TAKE ONE CAPSULE BY MOUTH DAILY   30 capsule   3   . clopidogrel (PLAVIX) 75 MG tablet      TAKE ONE (1) TABLET BY MOUTH EVERY DAY Patient taking differently: Take 75 mg by mouth daily.    30 tablet   3   . clopidogrel (PLAVIX) 75 MG tablet      TAKE ONE (1) TABLET BY MOUTH EVERY DAY   30 tablet   3   . nitroGLYCERIN (NITROSTAT) 0.4 MG SL tablet   Sublingual   Place 1 tablet (0.4 mg total) under the tongue every 5 (five) minutes as needed.   25 tablet   6   . oxyCODONE-acetaminophen (PERCOCET/ROXICET) 5-325 MG per tablet   Oral   Take 1 tablet by mouth every 4 (four) hours as needed for severe pain. Reported on 06/09/2015         . rosuvastatin (CRESTOR) 10 MG tablet   Oral   Take 1 tablet (10 mg total) by mouth daily.   30 tablet   3     Allergies Metoprolol succinate; Niacin; and Sertraline hcl  Family History  Problem Relation Age of Onset  . Coronary artery disease Mother     s/p CABG  . Heart failure Mother   . Heart disease Other   . Depression Other     Social History Social History  Substance Use Topics  . Smoking status: Former Smoker -- 0.50 packs/day for 1 years    Types: Cigarettes    Quit date: 05/09/1968  . Smokeless tobacco: Never Used  . Alcohol Use: No    Review of Systems Constitutional: No fever/chills Eyes: No visual changes. ENT: No sore throat. Cardiovascular: See history of present illness Respiratory: Denies shortness of breath. Gastrointestinal:  See history of present illness no vomiting.  No diarrhea.  No constipation. Genitourinary: Negative for dysuria. Musculoskeletal: Negative for back pain. Skin: Negative for rash. Neurological: Negative for headaches, focal weakness or numbness.  10-point ROS otherwise negative.  ____________________________________________   PHYSICAL EXAM:  VITAL SIGNS: ED Triage Vitals  Enc Vitals  Group     BP 10/29/15 0419 127/87 mmHg     Pulse Rate 10/29/15 0419 59     Resp 10/29/15 0419 22     Temp 10/29/15 0419 97.4 F (36.3 C)     Temp Source 10/29/15 0413 Oral     SpO2 10/29/15 0419 98 %     Weight 10/29/15 0419 168 lb (76.204 kg)     Height 10/29/15 0419 5\' 9"  (1.753 m)     Head Cir --  Peak Flow --      Pain Score 10/29/15 0413 9     Pain Loc --      Pain Edu? --      Excl. in GC? --    Constitutional: Alert and oriented. Well appearing and in no acute distress. Eyes: Conjunctivae are normal. PERRL. EOMI. Head: Atraumatic. Nose: No congestion/rhinnorhea. Mouth/Throat: Mucous membranes are moist.  Oropharynx non-erythematous. Neck: No stridor.   Cardiovascular: Normal rate, regular rhythm. Grossly normal heart sounds.  Good peripheral circulation. Respiratory: Normal respiratory effort.  No retractions. Lungs CTAB. Gastrointestinal: Soft and nontender except for some moderate point tenderness located in the extreme severe portion of the epigastrium. No distention. No abdominal bruits. No rebound or guarding or peritonitis. Musculoskeletal: No lower extremity tenderness nor edema.  No joint effusions. Neurologic:  Normal speech and language. No gross focal neurologic deficits are appreciated. Skin:  Skin is warm, dry and intact. No rash noted. Psychiatric: Mood and affect are normal. Speech and behavior are normal.  Discussed and offered the patient pain control  ____________________________________________   LABS (all labs ordered are listed, but only abnormal results are displayed)  Labs Reviewed  BASIC METABOLIC PANEL - Abnormal; Notable for the following:    CO2 19 (*)    Glucose, Bld 185 (*)    Creatinine, Ser 1.32 (*)    GFR calc non Af Amer 53 (*)    All other components within normal limits  CBC - Abnormal; Notable for the following:    WBC 12.2 (*)    HCT 39.0 (*)    All other components within normal limits  TROPONIN I  TROPONIN I  HEPATIC  FUNCTION PANEL  LIPASE, BLOOD   ____________________________________________  EKG  Reviewed and interpreted by me at 4:10 AM Ventricular rate 60 PR 136 QRS 80 QTc 400 Normal sinus rhythm, very minimal nonspecific T-wave abnormality possibly slight artifactual. No evidence of acute ST elevation MI or obvious ischemia. ____________________________________________  RADIOLOGY   CT Chest W Contrast (Final result) Result time: 10/29/15 05:49:10   Final result by Rad Results In Interface (10/29/15 05:49:10)   Narrative:   CLINICAL DATA: Epigastric and chest pain post esophageal dilatation yesterday.  EXAM: CT CHEST, ABDOMEN, AND PELVIS WITH CONTRAST  TECHNIQUE: Multidetector CT imaging of the chest, abdomen and pelvis was performed following the standard protocol during bolus administration of intravenous contrast.  CONTRAST: ISOVUE-300 IOPAMIDOL (ISOVUE-300) INJECTION 61%  COMPARISON: Radiograph earlier this day. Chest CT 07/01/2014  FINDINGS: CT CHEST FINDINGS  Esophagus: No evidence of acute injury. Minimal fluid in the upper esophagus at the level of the great vessel takeoff. No definite esophageal wall thickening. No extraluminal air wall contrast. No pneumomediastinum.  Mediastinum/Lymph Nodes: Upper normal caliber ascending thoracic aorta. Mild atherosclerosis. No dissection. Prominent prevascular nodes measuring 7 mm short axis. Lower paratracheal node measures 10 mm. These are unchanged to minimally decreased in size from prior chest CT. No definite hilar adenopathy. Coronary artery calcifications versus stents.  Lungs/Pleura: No pneumothorax. Subpleural and perifissural honeycombing consistent with fibrosis. There is lower lobe bronchiectasis. No focal airspace disease. No evidence of pulmonary edema. Right apical air-filled structure may be in apical bleb versus tracheal diverticulum but is unchanged from prior chest CT. No pleural effusion.  Right upper lobe bronchus arises directly from the trachea.  Musculoskeletal: There are no acute or suspicious osseous abnormalities. Degenerative change in the spine.  CT ABDOMEN PELVIS FINDINGS  Hepatobiliary: Subcentimeter hypodensities in the right hepatic dome and subcapsular  left lobe are too small to accurately characterize but likely small cysts. There is background low density consistent with steatosis. Gallstones within physiologically distended gallbladder. No biliary dilatation.  Pancreas: No ductal dilatation or inflammation.  Spleen: Normal.  Adrenals/Urinary Tract: Adrenal glands are normal. Homogeneous symmetric enhancement of the kidneys. No focal lesion. Symmetric excretion on delayed phase imaging. Urinary bladder is physiologically distended without wall thickening.  Stomach/Bowel: Oral contrast distends the stomach, no extraluminal contrast or perforation. There are no dilated or thickened bowel loops. Liquid and solid stool throughout the colon. Diverticulosis of the sigmoid colon without acute diverticulitis. The appendix is not confidently identified, no pericecal or right lower quadrant inflammation.  Vascular/Lymphatic: Moderate atherosclerosis of the abdominal aorta without aneurysm. No retroperitoneal adenopathy. No retroperitoneal fluid.  Reproductive: Prostate gland normal in size.  Other: No free air, free fluid, or intra-abdominal fluid collection.  Musculoskeletal: There are no acute or suspicious osseous abnormalities.  IMPRESSION: 1. No acute abnormality in the chest, abdomen, or pelvis. No adverse sequela of esophageal dilatation. 2. Pulmonary fibrosis. 3. Cholelithiasis. Hepatic steatosis. 4. Atherosclerosis. Coronary artery calcifications versus stents.   Electronically Signed By: Rubye Oaks M.D. On: 10/29/2015 05:49          CT Abdomen W Contrast (Final result) Result time: 10/29/15 05:49:10   Final result  by Rad Results In Interface (10/29/15 05:49:10)   Narrative:   CLINICAL DATA: Epigastric and chest pain post esophageal dilatation yesterday.  EXAM: CT CHEST, ABDOMEN, AND PELVIS WITH CONTRAST  TECHNIQUE: Multidetector CT imaging of the chest, abdomen and pelvis was performed following the standard protocol during bolus administration of intravenous contrast.  CONTRAST: ISOVUE-300 IOPAMIDOL (ISOVUE-300) INJECTION 61%  COMPARISON: Radiograph earlier this day. Chest CT 07/01/2014  FINDINGS: CT CHEST FINDINGS  Esophagus: No evidence of acute injury. Minimal fluid in the upper esophagus at the level of the great vessel takeoff. No definite esophageal wall thickening. No extraluminal air wall contrast. No pneumomediastinum.  Mediastinum/Lymph Nodes: Upper normal caliber ascending thoracic aorta. Mild atherosclerosis. No dissection. Prominent prevascular nodes measuring 7 mm short axis. Lower paratracheal node measures 10 mm. These are unchanged to minimally decreased in size from prior chest CT. No definite hilar adenopathy. Coronary artery calcifications versus stents.  Lungs/Pleura: No pneumothorax. Subpleural and perifissural honeycombing consistent with fibrosis. There is lower lobe bronchiectasis. No focal airspace disease. No evidence of pulmonary edema. Right apical air-filled structure may be in apical bleb versus tracheal diverticulum but is unchanged from prior chest CT. No pleural effusion. Right upper lobe bronchus arises directly from the trachea.  Musculoskeletal: There are no acute or suspicious osseous abnormalities. Degenerative change in the spine.  CT ABDOMEN PELVIS FINDINGS  Hepatobiliary: Subcentimeter hypodensities in the right hepatic dome and subcapsular left lobe are too small to accurately characterize but likely small cysts. There is background low density consistent with steatosis. Gallstones within physiologically  distended gallbladder. No biliary dilatation.  Pancreas: No ductal dilatation or inflammation.  Spleen: Normal.  Adrenals/Urinary Tract: Adrenal glands are normal. Homogeneous symmetric enhancement of the kidneys. No focal lesion. Symmetric excretion on delayed phase imaging. Urinary bladder is physiologically distended without wall thickening.  Stomach/Bowel: Oral contrast distends the stomach, no extraluminal contrast or perforation. There are no dilated or thickened bowel loops. Liquid and solid stool throughout the colon. Diverticulosis of the sigmoid colon without acute diverticulitis. The appendix is not confidently identified, no pericecal or right lower quadrant inflammation.  Vascular/Lymphatic: Moderate atherosclerosis of the abdominal aorta without aneurysm. No retroperitoneal  adenopathy. No retroperitoneal fluid.  Reproductive: Prostate gland normal in size.  Other: No free air, free fluid, or intra-abdominal fluid collection.  Musculoskeletal: There are no acute or suspicious osseous abnormalities.  IMPRESSION: 1. No acute abnormality in the chest, abdomen, or pelvis. No adverse sequela of esophageal dilatation. 2. Pulmonary fibrosis. 3. Cholelithiasis. Hepatic steatosis. 4. Atherosclerosis. Coronary artery calcifications versus stents.   Electronically Signed By: Rubye Oaks M.D. On: 10/29/2015 05:49          DG Chest 2 View (Final result) Result time: 10/29/15 04:48:22   Final result by Rad Results In Interface (10/29/15 04:48:22)   Narrative:   CLINICAL DATA: Chest pain for 5 hours. Endoscopy yesterday.  EXAM: CHEST 2 VIEW  COMPARISON: Radiographs 04/28/2015. Chest CT 07/01/2014  FINDINGS: Coarse interstitial opacities are again seen suggesting pulmonary fibrosis. Heart size and mediastinal contours are normal. No focal airspace disease, pleural effusion or pneumothorax. No evidence  of pneumomediastinum.  IMPRESSION: Pulmonary fibrosis. No evidence of acute process.   Electronically Signed By: Rubye Oaks M.D. On: 10/29/2015 04:48    ____________________________________________   PROCEDURES  Procedure(s) performed: None  Critical Care performed: No  ____________________________________________   INITIAL IMPRESSION / ASSESSMENT AND PLAN / ED COURSE  Pertinent labs & imaging results that were available during my care of the patient were reviewed by me and considered in my medical decision making (see chart for details).  In consideration of the patient's procedure today, primary concern given his epigastric and lower chest pain is esophageal etiology including possible spasm versus less likely perforation or inflammatory condition. I discussed with Dr. Debe Coder GI who advises that if a CT of the chest is performed and shows no perforation this would be extremely reassuring, and the patient likely could be discharged with regard to possible complication from endoscopy. [facility] will also follow up with the patient later today. However, given the patient's cardiac history we will also obtain cardiac biomarkers and basic labs. Those symptoms seem very atypical of acute coronary syndrome, he does have a history of and we will observe him in the ED though I suspect that if he has 2 normal troponins within a few hours of each other this is all extremely unlikely to represent acute coronary syndrome. The patient has a history of pulmonary fibrosis as well but denies any shortness of breath, wheezing. Seems highly unlikely he would've developed a pulmonary embolism after a routine outpatient procedure, and given his recent procedure gastroenterology recommends CT to evaluate primarily for perforation and I think this is agreeable.  The patient was offered pain control but does not want anything except for maybe some sort of Maalox, he reports he does not want  anything like morphine or stronger pain medicine at this time.  ----------------------------------------- 5:30 AM on 10/29/2015 -----------------------------------------  The patient is resting comfortably at this time. First labs including first troponin very reassuring.  ----------------------------------------- 7:19 AM on 10/29/2015 -----------------------------------------  Patient reports pain and all symptoms are gone away. Currently resting comfortably. Given atypical nature of his discomfort as well as his GI procedure I suspect likely esophageal spasm or inflammatory process. No evidence of perforation or significant complication at this time. Discussed with the patient, and he will follow up closely with his gastroenterologist and primary care doctor. Careful return precautions discussed, and also plan to obtain a second blood test here. Second troponin is normal patient remains comfortable, would recommend discharge to home with close follow-up. Ongoing care assigned to Dr. Inocencio Homes. ____________________________________________  FINAL CLINICAL IMPRESSION(S) / ED DIAGNOSES  Final diagnoses:  Epigastric abdominal pain  Esophagitis, unspecified      Sharyn Creamer, MD 10/29/15 [phone]

## 2015-10-29 NOTE — Discharge Instructions (Signed)
You have been seen in the Emergency Department (ED) today for chest pain.  As we have discussed todays test results are normal, but you need follow up with your doctor at [facility]   Return to the Emergency Department (ED) if you experience any further or severe upper abdominal pain, vomiting blood, can't eat, experience chest pain/pressure/tightness, difficulty breathing, or sudden sweating, or other symptoms that concern you.   Abdominal Pain, Adult Many things can cause abdominal pain. Usually, abdominal pain is not caused by a disease and will improve without treatment. It can often be observed and treated at home. Your health care provider will do a physical exam and possibly order blood tests and X-rays to help determine the seriousness of your pain. However, in many cases, more time must pass before a clear cause of the pain can be found. Before that point, your health care provider may not know if you need more testing or further treatment. HOME CARE INSTRUCTIONS Monitor your abdominal pain for any changes. The following actions may help to alleviate any discomfort you are experiencing:  Only take over-the-counter or prescription medicines as directed by your health care provider.  Do not take laxatives unless directed to do so by your health care provider.  Try a clear liquid diet (broth, tea, or water) as directed by your health care provider. Slowly move to a bland diet as tolerated. SEEK MEDICAL CARE IF:  You have unexplained abdominal pain.  You have abdominal pain associated with nausea or diarrhea.  You have pain when you urinate or have a bowel movement.  You experience abdominal pain that wakes you in the night.  You have abdominal pain that is worsened or improved by eating food.  You have abdominal pain that is worsened with eating fatty foods.  You have a fever. SEEK IMMEDIATE MEDICAL CARE IF:  Your pain does not go away within 2 hours.  You keep throwing up  (vomiting).  Your pain is felt only in portions of the abdomen, such as the right side or the left lower portion of the abdomen.  You pass bloody or black tarry stools. MAKE SURE YOU:  Understand these instructions.  Will watch your condition.  Will get help right away if you are not doing well or get worse.   This information is not intended to replace advice given to you by your health care provider. Make sure you discuss any questions you have with your health care provider.   Document Released: 02/22/2005 Document Revised: 02/03/2015 Document Reviewed: 01/22/2013 Elsevier Interactive Patient Education [employer]

## 2015-10-29 NOTE — ED Provider Notes (Signed)
-----------------------------------------   7:55 AM on 10/29/2015 -----------------------------------------  Care was assumed from Dr. Fanny Bien at 7 AM pending second troponin which is negative. The patient remains pain-free and is requesting discharge. His vital signs are stable. We'll DC with return precautions and close follow-up. He is comfortable with the discharge plan.  [name], MD 10/29/15 [phone]

## 2015-11-03 ENCOUNTER — Telehealth: Payer: Self-pay | Admitting: Cardiovascular Disease

## 2015-11-03 NOTE — Telephone Encounter (Signed)
Patients ex wife calling and states that she does not want her husband to know that she is calling. She states that he had his procedure last Friday 10/29/15 and then on Saturday he was in the hospital with chest pian. On Sunday she states that he was out mowing the yard and passed out twice and is still taking nitroglycerine. She is concerned about him but does not want him to know that she is calling. Let her know that I needed to speak with him and she said he wasn't there and that she again didn't want him to know she was calling about him. Let her know that his visit to the ED on Saturday showed that things at that time were not abnormal. Let her know that if he should have continued chest pain then he needs to be evaluated. She states that he refuses to go back and that she wants him to come in to be seen for follow up. Let her know that someone would be calling to schedule a follow up visit for him. Instructed that patient should return to the ED if he has continued shortness of breath, chest pain, or syncope. She verbalized understanding with no further questions regarding him. She also is calling about herself and separate phone note entered for that.

## 2015-11-03 NOTE — Telephone Encounter (Signed)
Pt ex calling stating she doesn't want patient to know that she is calling  But patient had surgery on upper part of throat on 10/29/15  Pt was having CP the next morning so they took him to ED  She states she could hardly drive but took him He is not admitting to it but she saw him take a nirto She thinks he is also having CP  Please advise.

## 2015-11-07 NOTE — Telephone Encounter (Signed)
Suspect he will need a follow up visit Prior ischemia workup negative, long hx of chronic chest pain He has pulmonary fibrosis causing SOB, likely has hypoxia if mowing without oxygen

## 2015-11-22 ENCOUNTER — Other Ambulatory Visit: Payer: Self-pay | Admitting: Cardiovascular Disease

## 2015-12-13 ENCOUNTER — Encounter: Payer: Self-pay | Admitting: Cardiovascular Disease

## 2015-12-13 ENCOUNTER — Ambulatory Visit (INDEPENDENT_AMBULATORY_CARE_PROVIDER_SITE_OTHER): Payer: Medicare Other | Admitting: Cardiovascular Disease

## 2015-12-13 VITALS — BP 128/87 | HR 55 | Ht 69.0 in | Wt 165.8 lb

## 2015-12-13 DIAGNOSIS — R079 Chest pain, unspecified: Secondary | ICD-10-CM

## 2015-12-13 DIAGNOSIS — R55 Syncope and collapse: Secondary | ICD-10-CM

## 2015-12-13 DIAGNOSIS — J849 Interstitial pulmonary disease, unspecified: Secondary | ICD-10-CM

## 2015-12-13 DIAGNOSIS — I2510 Atherosclerotic heart disease of native coronary artery without angina pectoris: Secondary | ICD-10-CM | POA: Diagnosis not present

## 2015-12-13 DIAGNOSIS — I119 Hypertensive heart disease without heart failure: Secondary | ICD-10-CM

## 2015-12-13 DIAGNOSIS — R0602 Shortness of breath: Secondary | ICD-10-CM

## 2015-12-13 NOTE — Progress Notes (Signed)
Patient ID: Shane Reid., male   DOB: Apr 28, 1946, 70 y.o.   MRN: [id number] Cardiology Office Note  Date:  12/13/2015   ID:  Shane Reid., DOB 05/01/1946, MRN [id number]  PCP:  Mickel Fuchs, MD   Chief Complaint  Patient presents with  . other    Pt denies chest pain today C/o sob and dizziness. Meds reviewed verbally with pt.    HPI:  Shane Reid is a 70 year old gentleman with a history of coronary artery disease, PCI with LAD PCI in 2008, history of chronic chest pain which is musculoskeletal, history of traumatic injury to the chest, history of esophageal strictures, status post esophageal stretching, h/o left neck pain radiating to the arm Felt to be musculoskeletal, and Chronic shortness of breath With CT scan documenting pulmonary fibrosis who follows up for his coronary artery disease  Recent CT scan documenting Pulmonary fibrosis (11/23/15)  He has worsening SOB with hot weather and humidity Otherwise feels his breathing is stable Difficulty doing exertional activity such as moving his garbage out to the street  Had recent EGD and Esophageal stretching at [facility] early  10/2015 Had severe pain following the procedure, went to the emergency room the next day for symptom relief  Reports he is Seeing  Pulmonary in [place], records not available Previously seen by pulmonary at [facility] and [place] and is refusing to take the "9 pills a day" for pulmonary fibrosis  Reports having pain in the left side of his neck, tender on palpation. Had similar symptoms previously, seemed to go away on their own, now back again  Has air-conditioning in one room in his house, using fans Denies any anginal pain  EKG on today's visit shows normal sinus rhythm with rate 60 bpm, no significant ST or T changes orthostatics done in the office today showing stable blood pressure and heart rate, systolic pressures 130s to 140   Other past medical history Previous lung biopsy by Dr.  Inez Catalina, has follow-up with pulmonary in [place].  Prior trauma to his mediastinum, Had severe rib pain, upper left mediastinal area,   previous stress test for chest pain 11/27/2013. There was no ischemia, ejection fraction 67%. Overall a low risk scan  admitted to the hospital 12/01/2013 with shortness of breath, cough, fever of 103.5. He was started on antibiotics for possible pneumonia. He left AMA today without any antibiotics. Hospital records were reviewed that showed x-ray with diffuse interstitial pattern in the lungs likely representing chronic fibrosis, unable to exclude pneumonitis. Magnesium was low. Creatinine 1.69, BUN 12. He was given several doses of ceftriaxone blood cultures were negative  Workup for chest pain 08/06/2013. Workup was negative  Previous trips to the emergency room 03/31/2013 for chest pain. He has periodic episodes of chest pain lasting for up to 2 or 3 days a time. In the center of his chest, reproducible with palpation. He did not stay for admission to the hospital, cardiac workup was essentially negative. In the past he has not wanted to take a pain medication or anti-inflammatory. Wife reports that he throws these medicines away.  cardiac catheterization in 2013 showing patent stent, no severe stenoses that would contribute to his chest pain. Right heart catheterization was done at the same time that showed normal right heart pressures (despite [facility] pulmonary insisting he had dilated pulmonary artery and high pressures).  he has had followup at [facility] pulmonary since then with right heart catheterization again confirming by his report, normal right  heart pressures. He reports they do not know why he is short of breath .   He had a stress test in February of 2011 that was negative. Myoview study. Carotid ultrasound done for bruits showed no significant atherosclerotic disease. chest x-ray November 12 2009 shows hyperinflation of the lungs consistent with  COPD, areas of fibrosis appear present as well echocardiogram done in October 2011 showed normal systolic function, diastolic relaxation abnormality otherwise no significant abnormalities  PMH:   has a past medical history of Anxiety; Asthma; Depression; GERD (gastroesophageal reflux disease); Hypercholesteremia; Bradycardia; Coronary artery disease; Other social stressor; Chronic Chest Pain; Hypertensive heart disease; Heart murmur; COPD (chronic obstructive pulmonary disease) (HCC); Idiopathic pulmonary fibrosis (HCC); CHF (congestive heart failure) (HCC); and Hypertension.  PSH:    Past Surgical History  Procedure Laterality Date  . Cardiac catheterization    . Testicle surgery    . Esophagus stretch    . Coronary angioplasty      stents times 4  . Eye surgery    . Video assisted thoracoscopy (vats)/thorocotomy Right 11/18/2014    Procedure: VIDEO ASSISTED THORACOSCOPY (VATS)/ wedge resection biopsy;  Surgeon: Hulda Marin, MD;  Location: [facility] ORS;  Service: General;  Laterality: Right;  . Lung biopsy      Current Outpatient Prescriptions  Medication Sig Dispense Refill  . albuterol (PROVENTIL) (2.5 MG/3ML) 0.083% nebulizer solution Take 3 mLs (2.5 mg total) by nebulization every 6 (six) hours as needed. 75 mL 11  . amLODipine-benazepril (LOTREL) 5-10 MG capsule Take 1 capsule by mouth daily. 90 capsule 3  . Choline Fenofibrate (FENOFIBRIC ACID) 135 MG CPDR Take 1 capsule by mouth daily. 30 capsule 3  . Choline Fenofibrate (FENOFIBRIC ACID) 135 MG CPDR TAKE ONE CAPSULE BY MOUTH DAILY 30 capsule 3  . clopidogrel (PLAVIX) 75 MG tablet TAKE ONE (1) TABLET BY MOUTH EVERY DAY (Patient taking differently: Take 75 mg by mouth daily. ) 30 tablet 3  . clopidogrel (PLAVIX) 75 MG tablet TAKE ONE (1) TABLET BY MOUTH EVERY DAY 30 tablet 3  . nitroGLYCERIN (NITROSTAT) 0.4 MG SL tablet Place 1 tablet (0.4 mg total) under the tongue every 5 (five) minutes as needed. 25 tablet 6  . rosuvastatin  (CRESTOR) 10 MG tablet Take 1 tablet (10 mg total) by mouth daily. 30 tablet 3  . rosuvastatin (CRESTOR) 10 MG tablet TAKE ONE (1) TABLET BY MOUTH EVERY DAY 30 tablet 3   No current facility-administered medications for this visit.     Allergies:   Metoprolol succinate; Niacin; and Sertraline hcl   Social History:  The patient  reports that he quit smoking about 47 years ago. His smoking use included Cigarettes. He has a .5 pack-year smoking history. He has never used smokeless tobacco. He reports that he does not drink alcohol or use illicit drugs.   Family History:   family history includes Coronary artery disease in his mother; Depression in his other; Heart disease in his other; Heart failure in his mother.    Review of Systems: Review of Systems  Constitutional: Negative.   Respiratory: Positive for shortness of breath.   Cardiovascular: Negative.   Gastrointestinal: Negative.   Musculoskeletal: Positive for joint pain and neck pain.       Right shoulder  Neurological: Negative.   Psychiatric/Behavioral: Negative.   All other systems reviewed and are negative.    PHYSICAL EXAM: VS:  BP 128/87 mmHg  Pulse 55  Ht  (1.753 m)  Wt 165 lb 12 oz (  75.184 kg)  BMI 24.47 kg/m2 , BMI Body mass index is 24.47 kg/(m^2). GEN: Well nourished, well developed, in no acute distress HEENT: normal Neck: no JVD, carotid bruits, or masses Cardiac: RRR; no murmurs, rubs, or gallops,no edema  Respiratory: b/l rhonchi bases to 1/2 up, normal work of breathing GI: soft, nontender, nondistended, + BS MS: no deformity or atrophy, right shoulder decreased ROM Skin: warm and dry, no rash Neuro:  Strength and sensation are intact Psych: euthymic mood, full affect    Recent Labs: 10/29/2015: ALT 39; BUN 11; Creatinine, Ser 1.32*; Hemoglobin 13.3; Platelets 249; Potassium 4.0; Sodium 137    Lipid Panel Lab Results  Component Value Date   CHOL 90 05/12/2010   HDL 33* 05/12/2010    LDLCALC 29 05/12/2010   TRIG 140 05/12/2010      Wt Readings from Last 3 Encounters:  12/13/15 165 lb 12 oz (75.184 kg)  10/29/15 168 lb (76.204 kg)  06/09/15 169 lb (76.658 kg)       ASSESSMENT AND PLAN:  Chest pain, unspecified chest pain type - Plan: EKG 12-Lead Currently with no symptoms of angina. No further workup at this time. Continue current medication regimen.  Syncope, unspecified syncope type - Plan: EKG 12-Lead None recently No orthostasis by numbers today  Hypertensive heart disease without heart failure Blood pressure is well controlled on today's visit. No changes made to the medications.  Shortness of breath Stable sx, longstanding SOB fro >5 years  ILD (interstitial lung disease) (HCC) Seen by pulmonary Not on therapy   Total encounter time more than 25 minutes  Greater than 50% was spent in counseling and coordination of care with the patient   Disposition:   F/U  6 months   Orders Placed This Encounter  Procedures  . EKG 12-Lead     Signed, Dossie Arbour, M.D., Ph.D. 12/13/2015  [facility] [place], [place] [phone]

## 2015-12-13 NOTE — Patient Instructions (Signed)
Medication Instructions:   No change to your medications  Labwork:  No labs needed  Testing/Procedures:  No testing needed  Follow-Up: It was a pleasure seeing you in the office today. Please call us if you have new issues that need to be addressed before your next appt.  [phone]  Your physician wants you to follow-up in: 12 months.  You will receive a reminder letter in the mail two months in advance. If you don't receive a letter, please call our office to schedule the follow-up appointment.  If you need a refill on your cardiac medications before your next appointment, please call your pharmacy.

## 2015-12-29 ENCOUNTER — Emergency Department: Payer: Medicare Other

## 2015-12-29 ENCOUNTER — Emergency Department
Admission: EM | Admit: 2015-12-29 | Discharge: 2015-12-29 | Disposition: A | Discharge status: Home / Self Care | Payer: Medicare Other | Attending: Emergency Medicine | Admitting: Emergency Medicine

## 2015-12-29 ENCOUNTER — Encounter: Payer: Self-pay | Admitting: Emergency Medicine

## 2015-12-29 DIAGNOSIS — R1032 Left lower quadrant pain: Secondary | ICD-10-CM | POA: Diagnosis present

## 2015-12-29 DIAGNOSIS — I2510 Atherosclerotic heart disease of native coronary artery without angina pectoris: Secondary | ICD-10-CM | POA: Diagnosis not present

## 2015-12-29 DIAGNOSIS — J45909 Unspecified asthma, uncomplicated: Secondary | ICD-10-CM | POA: Diagnosis not present

## 2015-12-29 DIAGNOSIS — J449 Chronic obstructive pulmonary disease, unspecified: Secondary | ICD-10-CM | POA: Insufficient documentation

## 2015-12-29 DIAGNOSIS — I110 Hypertensive heart disease with heart failure: Secondary | ICD-10-CM | POA: Insufficient documentation

## 2015-12-29 DIAGNOSIS — Z87891 Personal history of nicotine dependence: Secondary | ICD-10-CM | POA: Insufficient documentation

## 2015-12-29 DIAGNOSIS — Z79899 Other long term (current) drug therapy: Secondary | ICD-10-CM | POA: Insufficient documentation

## 2015-12-29 DIAGNOSIS — K5732 Diverticulitis of large intestine without perforation or abscess without bleeding: Secondary | ICD-10-CM | POA: Insufficient documentation

## 2015-12-29 DIAGNOSIS — I509 Heart failure, unspecified: Secondary | ICD-10-CM | POA: Insufficient documentation

## 2015-12-29 LAB — COMPREHENSIVE METABOLIC PANEL
ALK PHOS: 40 U/L (ref 38–126)
ALT: 29 U/L (ref 17–63)
ANION GAP: 7 (ref 5–15)
AST: 29 U/L (ref 15–41)
Albumin: 4.1 g/dL (ref 3.5–5.0)
BILIRUBIN TOTAL: 0.9 mg/dL (ref 0.3–1.2)
BUN: 10 mg/dL (ref 6–20)
CALCIUM: 9.1 mg/dL (ref 8.9–10.3)
CO2: 22 mmol/L (ref 22–32)
CREATININE: 1.16 mg/dL (ref 0.61–1.24)
Chloride: 108 mmol/L (ref 101–111)
Glucose, Bld: 134 mg/dL — ABNORMAL HIGH (ref 65–99)
Potassium: 3.7 mmol/L (ref 3.5–5.1)
SODIUM: 137 mmol/L (ref 135–145)
TOTAL PROTEIN: 7.8 g/dL (ref 6.5–8.1)

## 2015-12-29 LAB — CBC
HCT: 38.8 % — ABNORMAL LOW (ref 40.0–52.0)
HEMOGLOBIN: 13.5 g/dL (ref 13.0–18.0)
MCH: 29.3 pg (ref 26.0–34.0)
MCHC: 34.8 g/dL (ref 32.0–36.0)
MCV: 84.4 fL (ref 80.0–100.0)
PLATELETS: 246 10*3/uL (ref 150–440)
RBC: 4.6 MIL/uL (ref 4.40–5.90)
RDW: 13.6 % (ref 11.5–14.5)
WBC: 13.4 10*3/uL — ABNORMAL HIGH (ref 3.8–10.6)

## 2015-12-29 LAB — URINALYSIS COMPLETE WITH MICROSCOPIC (ARMC ONLY)
BILIRUBIN URINE: NEGATIVE
Bacteria, UA: NONE SEEN
Glucose, UA: NEGATIVE mg/dL
KETONES UR: NEGATIVE mg/dL
Leukocytes, UA: NEGATIVE
NITRITE: NEGATIVE
PH: 6 (ref 5.0–8.0)
PROTEIN: NEGATIVE mg/dL
SPECIFIC GRAVITY, URINE: 1.02 (ref 1.005–1.030)

## 2015-12-29 LAB — LIPASE, BLOOD: Lipase: 14 U/L (ref 11–51)

## 2015-12-29 MED ORDER — METRONIDAZOLE 500 MG PO TABS: 500.0000 mg | ORAL_TABLET | Freq: Three times a day (TID) | ORAL | 0 refills | Status: DC

## 2015-12-29 MED ORDER — IOPAMIDOL (ISOVUE-300) INJECTION 61%
100.0000 mL | Freq: Once | INTRAVENOUS | Status: AC | PRN
  Administered 2015-12-29: 100 mL via INTRAVENOUS
  Filled 2015-12-29: qty 100

## 2015-12-29 MED ORDER — DIATRIZOATE MEGLUMINE & SODIUM 66-10 % PO SOLN
15.0000 mL | Freq: Once | ORAL | Status: AC
  Administered 2015-12-29: 15 mL via ORAL

## 2015-12-29 MED ORDER — TRAMADOL HCL 50 MG PO TABS: 50.0000 mg | ORAL_TABLET | Freq: Four times a day (QID) | ORAL | 0 refills | Status: DC | PRN

## 2015-12-29 MED ORDER — CIPROFLOXACIN HCL 500 MG PO TABS: 500.0000 mg | ORAL_TABLET | Freq: Two times a day (BID) | ORAL | 0 refills | Status: AC

## 2015-12-29 MED ORDER — METRONIDAZOLE 500 MG PO TABS
500.0000 mg | ORAL_TABLET | Freq: Once | ORAL | Status: AC
  Administered 2015-12-29: 500 mg via ORAL
  Filled 2015-12-29: qty 1

## 2015-12-29 MED ORDER — CIPROFLOXACIN HCL 500 MG PO TABS
500.0000 mg | ORAL_TABLET | Freq: Once | ORAL | Status: AC
  Administered 2015-12-29: 500 mg via ORAL
  Filled 2015-12-29: qty 1

## 2015-12-29 NOTE — ED Provider Notes (Signed)
[facility] Emergency Department Provider Note        Time seen: ----------------------------------------- 2:07 PM on 12/29/2015 -----------------------------------------    I have reviewed the triage vital signs and the nursing notes.   HISTORY  Chief Complaint Abdominal Pain    HPI Shane Reid. is a 70 y.o. male who presents to the ERfor abdominal pain for last week. Patient states the pain is been worsening. Initially thought he was constipated so he took laxatives with good results. He's had difficulty moving his bowels since Monday. Today is Wednesday. He do not the laxative last night and he moved his mouth this morning but noticed slight blood on the tissue after wiping. He's never had abdominal pain like this before, describes it as a dull ache. He denies fever or vomiting.   Past Medical History:  Diagnosis Date  . Anxiety   . Asthma   . Bradycardia   . CHF (congestive heart failure) (HCC)   . Chronic Chest Pain   . COPD (chronic obstructive pulmonary disease) (HCC)   . Coronary artery disease    a. 2008 s/p PCI to LAD;  b. 2013 Cath: patent stent->Med Rx;  c. 11/2013 MV: EF 67%, no ischemia/infarct.  . Depression   . GERD (gastroesophageal reflux disease)   . Heart murmur    a. 02/2010 Echo: EF 55-60%, no rwma, Gr 2 DD, triv MR, mildly dil LA, nl RV.  [name] Hypercholesteremia   . Hypertension   . Hypertensive heart disease   . Idiopathic pulmonary fibrosis (HCC)    a. Seen by Dr. Dema Severin 01/2015 and Rx Esbriet - pt cannot afford.  . Other social stressor    a. wife with mental illness    Patient Active Problem List   Diagnosis Date Noted  . Hypertensive heart disease   . Chronic Chest Pain   . Coronary artery disease   . Hypercholesteremia   . 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 IPF (idiopathic pulmonary fibrosis) (HCC) 12/23/2014  . Pre-operative cardiovascular examination  11/05/2014  . ILD (interstitial lung disease) (HCC) 09/04/2014  . Interstitial lung disease (HCC) 06/22/2014  . Upper respiratory infection 12/03/2013  . CAROTID BRUIT, LEFT 05/12/2010  . Hyperlipidemia 07/01/2009  . CAD, NATIVE VESSEL 07/01/2009  . SHORTNESS OF BREATH 07/01/2009  . Atypical chest pain 07/01/2009    Past Surgical History:  Procedure Laterality Date  . CARDIAC CATHETERIZATION    . CORONARY ANGIOPLASTY     stents times 4  . Esophagus stretch    . EYE SURGERY    . LUNG BIOPSY    . TESTICLE SURGERY    . VIDEO ASSISTED THORACOSCOPY (VATS)/THOROCOTOMY Right 11/18/2014   Procedure: VIDEO ASSISTED THORACOSCOPY (VATS)/ wedge resection biopsy;  Surgeon: Hulda Marin, MD;  Location: [facility] ORS;  Service: General;  Laterality: Right;    Allergies Metoprolol succinate; Niacin; and Sertraline hcl  Social History Social History  Substance Use Topics  . Smoking status: Former Smoker    Packs/day: 0.50    Years: 1.00    Types: Cigarettes    Quit date: 05/09/1968  . Smokeless tobacco: Never Used  . Alcohol use No    Review of Systems Constitutional: Negative for fever. Cardiovascular: Negative for chest pain. Respiratory: Negative for shortness of breath. Gastrointestinal: Positive for abdominal pain, constipation Genitourinary: Negative for dysuria. Musculoskeletal: Negative for back pain. Skin: Negative for rash. Neurological: Negative for headaches, focal weakness or numbness.  10-point ROS otherwise negative.  ____________________________________________   PHYSICAL EXAM:  VITAL SIGNS: ED Triage Vitals  Enc Vitals Group     BP 12/29/15 1352 (!) 143/69     Pulse Rate 12/29/15 1352 77     Resp 12/29/15 1352 18     Temp 12/29/15 1352 98 F (36.7 C)     Temp Source 12/29/15 1352 Oral     SpO2 12/29/15 1352 98 %     Weight 12/29/15 1352 165 lb (74.8 kg)     Height 12/29/15 1352 5\' 9"  (1.753 m)     Head Circumference --      Peak Flow --      Pain Score  12/29/15 1351 5     Pain Loc --      Pain Edu? --      Excl. in GC? --     Constitutional: Alert and oriented. Well appearing and in no distress. Eyes: Conjunctivae are normal. PERRL. Normal extraocular movements. ENT   Head: Normocephalic and atraumatic.   Nose: No congestion/rhinnorhea.   Mouth/Throat: Mucous membranes are moist.   Neck: No stridor. Cardiovascular: Normal rate, regular rhythm. No murmurs, rubs, or gallops. Respiratory: Normal respiratory effort without tachypnea nor retractions. Breath sounds are clear and equal bilaterally. No wheezes/rales/rhonchi. Gastrointestinal:Normal bowel sounds, suprapubic and left lower quadrant tenderness, no rebound or guarding. Musculoskeletal: Nontender with normal range of motion in all extremities. No lower extremity tenderness nor edema. Neurologic:  Normal speech and language. No gross focal neurologic deficits are appreciated.  Skin:  Skin is warm, dry and intact. No rash noted. Psychiatric: Mood and affect are normal. Speech and behavior are normal.  ____________________________________________  ED COURSE:  Pertinent labs & imaging results that were available during my care of the patient were reviewed by me and considered in my medical decision making (see chart for details). Clinical Course  Patient presents with abdominal pain, suspect diverticulitis. I will order CT scan, patient declines pain medicine at this time  Procedures ____________________________________________   LABS (pertinent positives/negatives)  Labs Reviewed  COMPREHENSIVE METABOLIC PANEL - Abnormal; Notable for the following:       Result Value   Glucose, Bld 134 (*)    All other components within normal limits  CBC - Abnormal; Notable for the following:    WBC 13.4 (*)    HCT 38.8 (*)    All other components within normal limits  URINALYSIS COMPLETEWITH MICROSCOPIC ([facility] ONLY) - Abnormal; Notable for the following:    Color, Urine  YELLOW (*)    APPearance CLEAR (*)    Hgb urine dipstick 1+ (*)    Squamous Epithelial / LPF 0-5 (*)    All other components within normal limits  LIPASE, BLOOD    RADIOLOGY Images were viewed by me  CT of abdomen and pelvis with contrast IMPRESSION: Sigmoid diverticulosis with mild inflammatory stranding around the sigmoid colon compatible with active diverticulitis.  Cholelithiasis.  Mild fatty infiltration of liver.  COPD.  Bibasilar scarring/ fibrosis.  Aortoiliac atherosclerosis.  ____________________________________________  FINAL ASSESSMENT AND PLAN  Diverticulitis  Plan: Patient with labs and imaging as dictated above. Patient is in no acute distress, has CT findings of diverticulitis. He'll be started on Cipro and Flagyl, prescribed pain medicine and advised to have close follow-up with his doctor. He is stable for outpatient follow-up.   Emily Filbert, MD   Note: This dictation was prepared with Dragon dictation. Any transcriptional errors that result from this process are unintentional    Shane Reid  Mayford Knife, MD 12/29/15 1530

## 2015-12-29 NOTE — ED Triage Notes (Signed)
C/O pain to lower abdomen x 1 week.  Pain has been worsening.  States took a laxative Sunday evening with good results on Monday morning but has been unable to move bowels since Monday.  Another laxative taken last night, and moved bowels this morning and noticed blood on tissue after wiping.  Patient also c/o lower abdominal pain with moving bowels.

## 2016-01-25 ENCOUNTER — Other Ambulatory Visit: Payer: Self-pay | Admitting: Cardiovascular Disease

## 2016-03-23 ENCOUNTER — Encounter: Payer: Self-pay | Admitting: Emergency Medicine

## 2016-03-23 ENCOUNTER — Emergency Department
Admission: EM | Admit: 2016-03-23 | Discharge: 2016-03-23 | Disposition: A | Discharge status: Home / Self Care | Payer: Medicare Other | Attending: Emergency Medicine | Admitting: Emergency Medicine

## 2016-03-23 ENCOUNTER — Telehealth: Payer: Self-pay | Admitting: Cardiovascular Disease

## 2016-03-23 ENCOUNTER — Emergency Department: Payer: Medicare Other

## 2016-03-23 DIAGNOSIS — R0789 Other chest pain: Secondary | ICD-10-CM | POA: Diagnosis present

## 2016-03-23 DIAGNOSIS — I2510 Atherosclerotic heart disease of native coronary artery without angina pectoris: Secondary | ICD-10-CM | POA: Insufficient documentation

## 2016-03-23 DIAGNOSIS — Z7982 Long term (current) use of aspirin: Secondary | ICD-10-CM | POA: Diagnosis not present

## 2016-03-23 DIAGNOSIS — Z79899 Other long term (current) drug therapy: Secondary | ICD-10-CM | POA: Insufficient documentation

## 2016-03-23 DIAGNOSIS — J449 Chronic obstructive pulmonary disease, unspecified: Secondary | ICD-10-CM | POA: Insufficient documentation

## 2016-03-23 DIAGNOSIS — Z87891 Personal history of nicotine dependence: Secondary | ICD-10-CM | POA: Insufficient documentation

## 2016-03-23 DIAGNOSIS — R0600 Dyspnea, unspecified: Secondary | ICD-10-CM

## 2016-03-23 DIAGNOSIS — I110 Hypertensive heart disease with heart failure: Secondary | ICD-10-CM | POA: Insufficient documentation

## 2016-03-23 DIAGNOSIS — R0602 Shortness of breath: Secondary | ICD-10-CM | POA: Insufficient documentation

## 2016-03-23 DIAGNOSIS — J45909 Unspecified asthma, uncomplicated: Secondary | ICD-10-CM | POA: Insufficient documentation

## 2016-03-23 DIAGNOSIS — R079 Chest pain, unspecified: Secondary | ICD-10-CM | POA: Diagnosis not present

## 2016-03-23 DIAGNOSIS — I509 Heart failure, unspecified: Secondary | ICD-10-CM | POA: Diagnosis not present

## 2016-03-23 LAB — COMPREHENSIVE METABOLIC PANEL
ALK PHOS: 38 U/L (ref 38–126)
ALT: 36 U/L (ref 17–63)
AST: 43 U/L — AB (ref 15–41)
Albumin: 4.2 g/dL (ref 3.5–5.0)
Anion gap: 8 (ref 5–15)
BUN: 16 mg/dL (ref 6–20)
CALCIUM: 9.7 mg/dL (ref 8.9–10.3)
CHLORIDE: 105 mmol/L (ref 101–111)
CO2: 24 mmol/L (ref 22–32)
CREATININE: 1.46 mg/dL — AB (ref 0.61–1.24)
GFR calc Af Amer: 54 mL/min — ABNORMAL LOW (ref >60)
GFR, EST NON AFRICAN AMERICAN: 47 mL/min — AB (ref >60)
Glucose, Bld: 137 mg/dL — ABNORMAL HIGH (ref 65–99)
Potassium: 3.9 mmol/L (ref 3.5–5.1)
SODIUM: 137 mmol/L (ref 135–145)
Total Bilirubin: 1.1 mg/dL (ref 0.3–1.2)
Total Protein: 7.9 g/dL (ref 6.5–8.1)

## 2016-03-23 LAB — CBC
HEMATOCRIT: 42.2 % (ref 40.0–52.0)
HEMOGLOBIN: 14.3 g/dL (ref 13.0–18.0)
MCH: 28.8 pg (ref 26.0–34.0)
MCHC: 34 g/dL (ref 32.0–36.0)
MCV: 84.6 fL (ref 80.0–100.0)
Platelets: 234 10*3/uL (ref 150–440)
RBC: 4.98 MIL/uL (ref 4.40–5.90)
RDW: 14 % (ref 11.5–14.5)
WBC: 10.5 10*3/uL (ref 3.8–10.6)

## 2016-03-23 LAB — TROPONIN I

## 2016-03-23 MED ORDER — ALPRAZOLAM 0.5 MG PO TABS: 0.5000 mg | ORAL_TABLET | Freq: Three times a day (TID) | ORAL | 0 refills | Status: AC | PRN

## 2016-03-23 NOTE — Telephone Encounter (Signed)
Pt wife called, states pt is in the ED with Chest pain, she would like for Dr. Mariah Milling to know.

## 2016-03-23 NOTE — ED Triage Notes (Signed)
Pt to ed with c/o chest pain, tightness and sob.  Pt with cardiac history.

## 2016-03-23 NOTE — ED Provider Notes (Signed)
[facility] Emergency Department Provider Note        Time seen: ----------------------------------------- 10:42 AM on 03/23/2016 -----------------------------------------    I have reviewed the triage vital signs and the nursing notes.   HISTORY  Chief Complaint Chest Pain    HPI Shane Reid. is a 70 y.o. male who presents the ER for chest pain, tightness and shortness of breath. Patient states this occurs from time to time, he took one nitroglycerin with some improvement. He does have a history of pulmonary fibrosis as well as for cardiac stents. Recent cardiac catheter was in 2013, showed patent stents. He denies any other illnesses, injuries or recent complaints. Patient states when he pulls the trashcan around to the side of his house he gets short of breath and has chest pain.   Past Medical History:  Diagnosis Date  . Anxiety   . Asthma   . Bradycardia   . CHF (congestive heart failure) (HCC)   . Chronic Chest Pain   . COPD (chronic obstructive pulmonary disease) (HCC)   . Coronary artery disease    a. 2008 s/p PCI to LAD;  b. 2013 Cath: patent stent->Med Rx;  c. 11/2013 MV: EF 67%, no ischemia/infarct.  . Depression   . GERD (gastroesophageal reflux disease)   . Heart murmur    a. 02/2010 Echo: EF 55-60%, no rwma, Gr 2 DD, triv MR, mildly dil LA, nl RV.  [name] Hypercholesteremia   . Hypertension   . Hypertensive heart disease   . Idiopathic pulmonary fibrosis (HCC)    a. Seen by Dr. Dema Severin 01/2015 and Rx Esbriet - pt cannot afford.  . Other social stressor    a. wife with mental illness    Patient Active Problem List   Diagnosis Date Noted  . Hypertensive heart disease   . Chronic Chest Pain   . Coronary artery disease   . Hypercholesteremia   . 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 IPF (idiopathic pulmonary fibrosis) (HCC) 12/23/2014  . Pre-operative cardiovascular examination  11/05/2014  . ILD (interstitial lung disease) (HCC) 09/04/2014  . Interstitial lung disease (HCC) 06/22/2014  . Upper respiratory infection 12/03/2013  . CAROTID BRUIT, LEFT 05/12/2010  . Hyperlipidemia 07/01/2009  . CAD, NATIVE VESSEL 07/01/2009  . SHORTNESS OF BREATH 07/01/2009  . Atypical chest pain 07/01/2009    Past Surgical History:  Procedure Laterality Date  . CARDIAC CATHETERIZATION    . CORONARY ANGIOPLASTY     stents times 4  . Esophagus stretch    . EYE SURGERY    . LUNG BIOPSY    . TESTICLE SURGERY    . VIDEO ASSISTED THORACOSCOPY (VATS)/THOROCOTOMY Right 11/18/2014   Procedure: VIDEO ASSISTED THORACOSCOPY (VATS)/ wedge resection biopsy;  Surgeon: Hulda Marin, MD;  Location: [facility] ORS;  Service: General;  Laterality: Right;    Allergies Metoprolol succinate; Niacin; and Sertraline hcl  Social History Social History  Substance Use Topics  . Smoking status: Former Smoker    Packs/day: 0.50    Years: 1.00    Types: Cigarettes    Quit date: 05/09/1968  . Smokeless tobacco: Never Used  . Alcohol use No    Review of Systems Constitutional: Negative for fever. Cardiovascular: Positive for chest pain Respiratory: Positive shortness of breath Gastrointestinal: Negative for abdominal pain, vomiting and diarrhea. Genitourinary: Negative for dysuria. Musculoskeletal: Negative for back pain. Skin: Negative for rash. Neurological: Negative for headaches, focal weakness or numbness.  10-point ROS otherwise  negative.  ____________________________________________   PHYSICAL EXAM:  VITAL SIGNS: ED Triage Vitals [03/23/16 1035]  Enc Vitals Group     BP (!) 112/53     Pulse Rate 77     Resp 18     Temp 97.5 F (36.4 C)     Temp Source Oral     SpO2 100 %     Weight 165 lb (74.8 kg)     Height 5\' 9"  (1.753 m)     Head Circumference      Peak Flow      Pain Score 0     Pain Loc      Pain Edu?      Excl. in GC?     Constitutional: Alert and oriented.  Well appearing and in no distress. Eyes: Conjunctivae are normal. PERRL. Normal extraocular movements. ENT   Head: Normocephalic and atraumatic.   Nose: No congestion/rhinnorhea.   Mouth/Throat: Mucous membranes are moist.   Neck: No stridor. Cardiovascular: Normal rate, regular rhythm. No murmurs, rubs, or gallops. Respiratory: Normal respiratory effort without tachypnea nor retractions. Diminished breath sounds but grossly clear Gastrointestinal: Soft and nontender. Normal bowel sounds Musculoskeletal: Nontender with normal range of motion in all extremities. No lower extremity tenderness nor edema. Neurologic:  Normal speech and language. No gross focal neurologic deficits are appreciated.  Skin:  Skin is warm, dry and intact. No rash noted. Psychiatric: Mood and affect are normal. Speech and behavior are normal.  ____________________________________________  EKG: Interpreted by me. Sinus rhythm with a rate of 75 bpm, normal PR interval, normal QRS, normal QT, normal axis.  ____________________________________________  ED COURSE:  Pertinent labs & imaging results that were available during my care of the patient were reviewed by me and considered in my medical decision making (see chart for details). Clinical Course  Patient is in no distress, we will assess with cardiac labs and imaging.  Procedures ____________________________________________   LABS (pertinent positives/negatives)  Labs Reviewed  COMPREHENSIVE METABOLIC PANEL - Abnormal; Notable for the following:       Result Value   Glucose, Bld 137 (*)    Creatinine, Ser 1.46 (*)    AST 43 (*)    GFR calc non Af Amer 47 (*)    GFR calc Af Amer 54 (*)    All other components within normal limits  CBC  TROPONIN I    RADIOLOGY  Chest x-ray IMPRESSION: Chronic changes of interstitial lung disease. No acute overlying pulmonary process. ____________________________________________  FINAL ASSESSMENT  AND PLAN  Chest pain, shortness of breath  Plan: Patient with labs and imaging as dictated above. Patient is in no distress, symptoms are likely chronic and intermittent. He has requested something for anxiety. I will start him on low-dose Xanax. He is stable for outpatient follow-up.   Emily FilbertWilliams, Jonathan E, MD   Note: This dictation was prepared with Dragon dictation. Any transcriptional errors that result from this process are unintentional    Emily FilbertJonathan E Williams, MD 03/23/16 1225

## 2016-03-28 ENCOUNTER — Other Ambulatory Visit: Payer: Self-pay | Admitting: Cardiovascular Disease

## 2016-03-30 ENCOUNTER — Telehealth: Payer: Self-pay | Admitting: Cardiovascular Disease

## 2016-03-30 NOTE — Telephone Encounter (Signed)
Please advise pt

## 2016-03-30 NOTE — Telephone Encounter (Signed)
Pt ex wife calling stating pt received a letter stating Fenofibric is no longer covered.  Would like some advised on this

## 2016-03-30 NOTE — Telephone Encounter (Signed)
Would hold the fenofibrate for now Continue Crestor

## 2016-03-31 NOTE — Telephone Encounter (Signed)
Notified Sel[facility] per Dr. Mariah Milling need to hold the Fenofibrate for now and continue with the crestor.  Pt. understands instructions and will call if as any further questions.

## 2016-04-10 ENCOUNTER — Telehealth: Payer: Self-pay | Admitting: Cardiovascular Disease

## 2016-04-10 NOTE — Telephone Encounter (Signed)
Spoke w/ pt's wife.  Advised her that stopping his fenofibrate would not cause these sx. Asked her to continue to monitor.  She then changed the subject and began telling me about her recent surgeries. Asked her to call back if pt's sx do not improve.

## 2016-04-10 NOTE — Telephone Encounter (Signed)
Pt wife called, states Dr. Mariah Milling took pt off of Fenofibirc acid and has been sick, states he is "hurting and everything" States he feels as if he is going to pass out, and has dizziness. Please call.

## 2016-05-23 ENCOUNTER — Telehealth: Payer: Self-pay | Admitting: Cardiovascular Disease

## 2016-05-23 ENCOUNTER — Other Ambulatory Visit: Payer: Self-pay | Admitting: Cardiovascular Disease

## 2016-05-23 NOTE — Telephone Encounter (Signed)
Refill sent to local pharmacy.

## 2016-05-23 NOTE — Telephone Encounter (Signed)
*  STAT* If patient is at the pharmacy, call can be transferred to refill team.   1. Which medications need to be refilled? (please list name of each medication and dose if known) amLODipine-benazepril (LOTREL) 5-10 MG capsule and clopidogrel (PLAVIX) 75 MG tablet  2. Which pharmacy/location (including street and city if local pharmacy) is medication to be sent to? [facility]  3. Do they need a 30 day or 90 day supply? 30 day

## 2016-06-19 ENCOUNTER — Other Ambulatory Visit: Payer: Self-pay | Admitting: Cardiovascular Disease

## 2016-06-19 ENCOUNTER — Telehealth: Payer: Self-pay | Admitting: Cardiovascular Disease

## 2016-06-19 MED ORDER — ROSUVASTATIN CALCIUM 10 MG PO TABS: 10.0000 mg | ORAL_TABLET | Freq: Every day | ORAL | 1 refills | Status: DC

## 2016-06-19 NOTE — Telephone Encounter (Signed)
Sent refill

## 2016-06-19 NOTE — Telephone Encounter (Signed)
°*  STAT* If patient is at the pharmacy, call can be transferred to refill team.   1. Which medications need to be refilled? (please list name of each medication and dose if known) Rosuvastatin   2. Which pharmacy/location (including street and city if local pharmacy) is medication to be sent to? [facility]   3. Do they need a 30 day or 90 day supply? 90 day

## 2016-06-22 ENCOUNTER — Telehealth: Payer: Self-pay | Admitting: Cardiovascular Disease

## 2016-06-22 NOTE — Telephone Encounter (Signed)
Spoke with patients wife per release form and she states that her husband is doubled over in pain. She reports that he is not at home at this time and went to the store to pick her up something. Offered to schedule appointment for her husband but she told me to call back later.

## 2016-06-22 NOTE — Telephone Encounter (Signed)
Patient is having pain (doubled over) at cath site in groin.  Please call.

## 2016-06-22 NOTE — Telephone Encounter (Signed)
Spoke with patient and he states that he had some sharp pains to his cath site. I asked if he had any groin swelling and he denied any that he knew of. Instructed him to just continue monitoring that area and give [place] a call back if his symptoms persist and we will see if he needs to possibly schedule a follow up. He verbalized understanding of our conversation, agreement with plan of care, and had no further questions at this time.

## 2016-07-27 ENCOUNTER — Encounter: Payer: Self-pay | Admitting: Emergency Medicine

## 2016-07-27 ENCOUNTER — Emergency Department
Admission: EM | Admit: 2016-07-27 | Discharge: 2016-07-27 | Disposition: A | Discharge status: Home / Self Care | Payer: Medicare Other | Attending: Emergency Medicine | Admitting: Emergency Medicine

## 2016-07-27 DIAGNOSIS — W268XXA Contact with other sharp object(s), not elsewhere classified, initial encounter: Secondary | ICD-10-CM | POA: Insufficient documentation

## 2016-07-27 DIAGNOSIS — S61411A Laceration without foreign body of right hand, initial encounter: Secondary | ICD-10-CM

## 2016-07-27 DIAGNOSIS — Z79899 Other long term (current) drug therapy: Secondary | ICD-10-CM | POA: Diagnosis not present

## 2016-07-27 DIAGNOSIS — J45909 Unspecified asthma, uncomplicated: Secondary | ICD-10-CM | POA: Diagnosis not present

## 2016-07-27 DIAGNOSIS — Z87891 Personal history of nicotine dependence: Secondary | ICD-10-CM | POA: Insufficient documentation

## 2016-07-27 DIAGNOSIS — Y9389 Activity, other specified: Secondary | ICD-10-CM | POA: Insufficient documentation

## 2016-07-27 DIAGNOSIS — Y998 Other external cause status: Secondary | ICD-10-CM | POA: Diagnosis not present

## 2016-07-27 DIAGNOSIS — S51812A Laceration without foreign body of left forearm, initial encounter: Secondary | ICD-10-CM | POA: Insufficient documentation

## 2016-07-27 DIAGNOSIS — Z7982 Long term (current) use of aspirin: Secondary | ICD-10-CM | POA: Insufficient documentation

## 2016-07-27 DIAGNOSIS — J449 Chronic obstructive pulmonary disease, unspecified: Secondary | ICD-10-CM | POA: Diagnosis not present

## 2016-07-27 DIAGNOSIS — I110 Hypertensive heart disease with heart failure: Secondary | ICD-10-CM | POA: Insufficient documentation

## 2016-07-27 DIAGNOSIS — Y929 Unspecified place or not applicable: Secondary | ICD-10-CM | POA: Diagnosis not present

## 2016-07-27 DIAGNOSIS — I509 Heart failure, unspecified: Secondary | ICD-10-CM | POA: Insufficient documentation

## 2016-07-27 DIAGNOSIS — I2510 Atherosclerotic heart disease of native coronary artery without angina pectoris: Secondary | ICD-10-CM | POA: Insufficient documentation

## 2016-07-27 MED ORDER — CEPHALEXIN 500 MG PO CAPS: 500.0000 mg | ORAL_CAPSULE | Freq: Four times a day (QID) | ORAL | 0 refills | Status: AC

## 2016-07-27 MED ORDER — LIDOCAINE-EPINEPHRINE 2 %-1:100000 IJ SOLN
INTRAMUSCULAR | Status: AC
  Filled 2016-07-27: qty 3.4

## 2016-07-27 NOTE — ED Triage Notes (Signed)
Patient states that he accidentally cut his left lower forearm with a box cutter. Pressure dressing applied to laceration in triage.

## 2016-07-27 NOTE — ED Notes (Signed)

## 2016-07-28 NOTE — ED Provider Notes (Signed)
[facility] Emergency Department Provider Note  ____________________________________________  Time seen: Approximately 12:16 AM  I have reviewed the triage vital signs and the nursing notes.   HISTORY  Chief Complaint Laceration    HPI Shane Reid. is a 71 y.o. male presenting to the emergency department with a 2.5 cm semilunar laceration sustained to the dorsal distal left forearm. Patient states that he was working with a box cutter that slipped. Patient states that he immediately applied a clean dressing. His tetanus status is up-to-date. Patient denies weakness and radiculopathy. Patient denies chest pain, chest tightness, shortness of breath, abdominal pain, nausea and vomiting.Patient is left-handed.   Past Medical History:  Diagnosis Date  . Anxiety   . Asthma   . Bradycardia   . CHF (congestive heart failure) (HCC)   . Chronic Chest Pain   . COPD (chronic obstructive pulmonary disease) (HCC)   . Coronary artery disease    a. 2008 s/p PCI to LAD;  b. 2013 Cath: patent stent->Med Rx;  c. 11/2013 MV: EF 67%, no ischemia/infarct.  . Depression   . GERD (gastroesophageal reflux disease)   . Heart murmur    a. 02/2010 Echo: EF 55-60%, no rwma, Gr 2 DD, triv MR, mildly dil LA, nl RV.  [name] Hypercholesteremia   . Hypertension   . Hypertensive heart disease   . Idiopathic pulmonary fibrosis (HCC)    a. Seen by Dr. Dema Severin 01/2015 and Rx Esbriet - pt cannot afford.  . Other social stressor    a. wife with mental illness    Patient Active Problem List   Diagnosis Date Noted  . Hypertensive heart disease   . Chronic Chest Pain   . Coronary artery disease   . Hypercholesteremia   . 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 IPF (idiopathic pulmonary fibrosis) (HCC) 12/23/2014  . Pre-operative cardiovascular examination 11/05/2014  . ILD (interstitial lung disease) (HCC) 09/04/2014  . Interstitial lung  disease (HCC) 06/22/2014  . Upper respiratory infection 12/03/2013  . CAROTID BRUIT, LEFT 05/12/2010  . Hyperlipidemia 07/01/2009  . CAD, NATIVE VESSEL 07/01/2009  . SHORTNESS OF BREATH 07/01/2009  . Atypical chest pain 07/01/2009    Past Surgical History:  Procedure Laterality Date  . CARDIAC CATHETERIZATION    . CORONARY ANGIOPLASTY     stents times 4  . Esophagus stretch    . EYE SURGERY    . LUNG BIOPSY    . TESTICLE SURGERY    . VIDEO ASSISTED THORACOSCOPY (VATS)/THOROCOTOMY Right 11/18/2014   Procedure: VIDEO ASSISTED THORACOSCOPY (VATS)/ wedge resection biopsy;  Surgeon: Hulda Marin, MD;  Location: [facility] ORS;  Service: General;  Laterality: Right;    Prior to Admission medications   Medication Sig Start Date End Date Taking? Authorizing Provider  albuterol (PROVENTIL) (2.5 MG/3ML) 0.083% nebulizer solution Take 3 mLs (2.5 mg total) by nebulization every 6 (six) hours as needed. 05/02/12   Antonieta Iba, MD  ALPRAZolam Prudy Feeler) 0.5 MG tablet Take 1 tablet (0.5 mg total) by mouth 3 (three) times daily as needed for sleep or anxiety. 03/23/16 03/23/17  Emily Filbert, MD  amLODipine-benazepril (LOTREL) 5-10 MG capsule TAKE ONE CAPSULE BY MOUTH DAILY 05/23/16   Antonieta Iba, MD  amLODipine-benazepril (LOTREL) 5-10 MG capsule TAKE ONE CAPSULE BY MOUTH DAILY 05/23/16   Antonieta Iba, MD  aspirin EC 81 MG tablet Take 81 mg by mouth every morning. 08/06/12   Historical Provider, MD  cephALEXin (  KEFLEX) 500 MG capsule Take 1 capsule (500 mg total) by mouth 4 (four) times daily. 07/27/16 08/06/16  Orvil Feil, PA-C  Choline Fenofibrate (FENOFIBRIC ACID) 135 MG CPDR Take 1 capsule by mouth daily. 12/15/14   Antonieta Iba, MD  clopidogrel (PLAVIX) 75 MG tablet TAKE ONE TABLET BY MOUTH EVERY DAY 05/23/16   Antonieta Iba, MD  clopidogrel (PLAVIX) 75 MG tablet TAKE ONE TABLET BY MOUTH EVERY DAY 05/23/16   Antonieta Iba, MD  LYCOPENE PO Take 1 tablet by mouth daily.  05/10/05   Historical Provider, MD  nitroGLYCERIN (NITROSTAT) 0.4 MG SL tablet Place 1 tablet (0.4 mg total) under the tongue every 5 (five) minutes as needed. 05/12/15   Antonieta Iba, MD  Pirfenidone 801 MG TABS Take 801 mg by mouth 3 (three) times daily with meals. 02/29/16   Historical Provider, MD  rosuvastatin (CRESTOR) 10 MG tablet Take 1 tablet (10 mg total) by mouth daily. 05/12/15   Antonieta Iba, MD  rosuvastatin (CRESTOR) 10 MG tablet Take 1 tablet (10 mg total) by mouth daily. 06/19/16   Antonieta Iba, MD    Allergies Metoprolol succinate; Niacin; and Sertraline hcl  Family History  Problem Relation Age of Onset  . Coronary artery disease Mother     s/p CABG  . Heart failure Mother   . Heart disease Other   . Depression Other     Social History Social History  Substance Use Topics  . Smoking status: Former Smoker    Packs/day: 0.50    Years: 1.00    Types: Cigarettes    Quit date: 05/09/1968  . Smokeless tobacco: Never Used  . Alcohol use No    Review of Systems  Constitutional: No fever/chills Eyes: No visual changes. No discharge ENT: No upper respiratory complaints. Cardiovascular: no chest pain. Respiratory: no cough. No SOB. Gastrointestinal: No abdominal pain.  No nausea, no vomiting.  No diarrhea.  No constipation. Musculoskeletal: Negative for musculoskeletal pain. Skin: Patient has a laceration of the skin overlying the dorsal left forearm.  Neurological: Negative for headaches, focal weakness or numbness. ____________________________________________   PHYSICAL EXAM:  VITAL SIGNS: ED Triage Vitals  Enc Vitals Group     BP 07/27/16 2118 (!) 155/74     Pulse Rate 07/27/16 2118 75     Resp 07/27/16 2118 18     Temp 07/27/16 2116 98 F (36.7 C)     Temp Source 07/27/16 2116 Oral     SpO2 07/27/16 2118 96 %     Weight 07/27/16 2118 164 lb (74.4 kg)     Height 07/27/16 2118 5\' 9"  (1.753 m)     Head Circumference --      Peak Flow --       Pain Score 07/27/16 2118 7     Pain Loc --      Pain Edu? --      Excl. in GC? --    Constitutional: Alert and oriented. Well appearing and in no acute distress.  Cardiovascular: Normal rate, regular rhythm. Normal S1 and S2.  Good peripheral circulation. Respiratory: Normal respiratory effort without tachypnea or retractions. Lungs CTAB. Good air entry to the bases with no decreased or absent breath sounds. Musculoskeletal: Patient has 5 out of 5 strength in the upper extremities bilaterally. Patient is able to perform full range of motion at the shoulder, elbow and wrist bilaterally. Patient is able to move all 10 fingers bilaterally and symmetrically. Palpable radial and  ulnar pulses bilaterally and symmetrically. Neurologic:  Normal speech and language. No gross focal neurologic deficits are appreciated. Reflexes are 2+ and symmetric in the upper extremities bilaterally. Skin:  Patient has a 2.5 cm semilunar laceration of the skin overlying the dorsal left distal forearm.  Psychiatric: Mood and affect are normal. Speech and behavior are normal. Patient exhibits appropriate insight and judgement. ____________________________________________   LABS (all labs ordered are listed, but only abnormal results are displayed)  Labs Reviewed - No data to display ____________________________________________  EKG   ____________________________________________  RADIOLOGY   No results found.  ____________________________________________    PROCEDURES  Procedure(s) performed:    Procedures    Medications  lidocaine-EPINEPHrine (XYLOCAINE W/EPI) 2 %-1:100000 (with pres) injection (not administered)   LACERATION REPAIR Performed by: Orvil Feil Authorized by: Orvil Feil Consent: Verbal consent obtained. Risks and benefits: risks, benefits and alternatives were discussed Consent given by: patient Patient identity confirmed: provided demographic data Prepped and  Draped in normal sterile fashion Wound explored  Laceration Location: Distal Dorsal Left Forearm  Laceration Length: 2.5 cm  No Foreign Bodies seen or palpated  Anesthesia: local infiltration  Local anesthetic: lidocaine 1% without epinephrine  Anesthetic total: 3 ml  Irrigation method: syringe Amount of cleaning: standard  Skin closure: 4-0 Ethilon   Number of sutures: 9  Technique: Simple Interrupted   Patient tolerance: Patient tolerated the procedure well with no immediate complications.   ____________________________________________   INITIAL IMPRESSION / ASSESSMENT AND PLAN / ED COURSE  Pertinent labs & imaging results that were available during my care of the patient were reviewed by me and considered in my medical decision making (see chart for details).  Review of the [place] CSRS was performed in accordance of the [facility] prior to dispensing any controlled drugs.     Assessment and Plan: Left Forearm Laceration:  Patient presents to the emergency department with a 2.5 centimeter semilunar laceration sustained to the distal dorsal left forearm. Patient underwent laceration repair in the emergency department. Patient tolerated the procedure well. He was discharged with Keflex. All patient questions were answered. ____________________________________________  FINAL CLINICAL IMPRESSION(S) / ED DIAGNOSES  Final diagnoses:  Laceration of right hand without foreign body, initial encounter      NEW MEDICATIONS STARTED DURING THIS VISIT:  Discharge Medication List as of 07/27/2016 10:41 PM    START taking these medications   Details  cephALEXin (KEFLEX) 500 MG capsule Take 1 capsule (500 mg total) by mouth 4 (four) times daily., Starting Thu 07/27/2016, Until Sun 08/06/2016, Print            This chart was dictated using voice recognition software/Dragon. Despite best efforts to proofread, errors can occur which can change the meaning. Any change was purely  unintentional.    Orvil Feil, PA-C 07/28/16 6384    Emily Filbert, MD 07/31/16 [phone]

## 2016-08-02 ENCOUNTER — Encounter: Payer: Self-pay | Admitting: *Deleted

## 2016-08-02 ENCOUNTER — Emergency Department
Admission: EM | Admit: 2016-08-02 | Discharge: 2016-08-02 | Disposition: A | Discharge status: Home / Self Care | Payer: Medicare Other | Attending: Emergency Medicine | Admitting: Emergency Medicine

## 2016-08-02 DIAGNOSIS — J45909 Unspecified asthma, uncomplicated: Secondary | ICD-10-CM | POA: Diagnosis not present

## 2016-08-02 DIAGNOSIS — Z5189 Encounter for other specified aftercare: Secondary | ICD-10-CM

## 2016-08-02 DIAGNOSIS — J449 Chronic obstructive pulmonary disease, unspecified: Secondary | ICD-10-CM | POA: Diagnosis not present

## 2016-08-02 DIAGNOSIS — I2510 Atherosclerotic heart disease of native coronary artery without angina pectoris: Secondary | ICD-10-CM | POA: Insufficient documentation

## 2016-08-02 DIAGNOSIS — Y828 Other medical devices associated with adverse incidents: Secondary | ICD-10-CM | POA: Diagnosis not present

## 2016-08-02 DIAGNOSIS — I110 Hypertensive heart disease with heart failure: Secondary | ICD-10-CM | POA: Diagnosis not present

## 2016-08-02 DIAGNOSIS — T8131XA Disruption of external operation (surgical) wound, not elsewhere classified, initial encounter: Secondary | ICD-10-CM | POA: Diagnosis not present

## 2016-08-02 DIAGNOSIS — L03113 Cellulitis of right upper limb: Secondary | ICD-10-CM

## 2016-08-02 DIAGNOSIS — I509 Heart failure, unspecified: Secondary | ICD-10-CM | POA: Insufficient documentation

## 2016-08-02 DIAGNOSIS — Z87891 Personal history of nicotine dependence: Secondary | ICD-10-CM | POA: Insufficient documentation

## 2016-08-02 DIAGNOSIS — T8130XA Disruption of wound, unspecified, initial encounter: Secondary | ICD-10-CM

## 2016-08-02 DIAGNOSIS — Z7982 Long term (current) use of aspirin: Secondary | ICD-10-CM | POA: Diagnosis not present

## 2016-08-02 DIAGNOSIS — Z4801 Encounter for change or removal of surgical wound dressing: Secondary | ICD-10-CM | POA: Diagnosis present

## 2016-08-02 MED ORDER — SULFAMETHOXAZOLE-TRIMETHOPRIM 800-160 MG PO TABS: 1.0000 | ORAL_TABLET | Freq: Two times a day (BID) | ORAL | 0 refills | Status: DC

## 2016-08-02 NOTE — ED Notes (Signed)
See triage note  States he had sutures placed in right forearm about 5-6 days ago..noticed some redness with some drainage yesterday

## 2016-08-02 NOTE — ED Triage Notes (Signed)
States he had sutures placed Thursday in his right wrist after a cut with a box cutter and states now the wound is draining and he believes some sutures have come out

## 2016-08-02 NOTE — ED Provider Notes (Signed)
[facility] Emergency Department Provider Note  ____________________________________________  Time seen: Approximately 6:12 PM  I have reviewed the triage vital signs and the nursing notes.   HISTORY  Chief Complaint Wound Check    HPI Shane Reid. is a 71 y.o. male who presents emergency department for wound reevaluation. Patient was seen in this department 5 days ago for a laceration to the right forearm. This was closed in emergency department. Patient reports that he has continued to do work, and has busted 3 of the sutures. Patient reports now the area is turning slightly red, draining a clear drainage. Patient is concerned for infection. He denies any numbness or tingling in any digit of the right hand. He denies any fevers or chills. No complaint of this time.  Patient was discharged home with prescription for Keflex, he has not been taking same and does not know where prescription is.   Past Medical History:  Diagnosis Date  . Anxiety   . Asthma   . Bradycardia   . CHF (congestive heart failure) (HCC)   . Chronic Chest Pain   . COPD (chronic obstructive pulmonary disease) (HCC)   . Coronary artery disease    a. 2008 s/p PCI to LAD;  b. 2013 Cath: patent stent->Med Rx;  c. 11/2013 MV: EF 67%, no ischemia/infarct.  . Depression   . GERD (gastroesophageal reflux disease)   . Heart murmur    a. 02/2010 Echo: EF 55-60%, no rwma, Gr 2 DD, triv MR, mildly dil LA, nl RV.  [name] Hypercholesteremia   . Hypertension   . Hypertensive heart disease   . Idiopathic pulmonary fibrosis (HCC)    a. Seen by Dr. Dema Severin 01/2015 and Rx Esbriet - pt cannot afford.  . Other social stressor    a. wife with mental illness    Patient Active Problem List   Diagnosis Date Noted  . Hypertensive heart disease   . Chronic Chest Pain   . Coronary artery disease   . Hypercholesteremia   . 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 IPF (idiopathic pulmonary fibrosis) (HCC) 12/23/2014  . Pre-operative cardiovascular examination 11/05/2014  . ILD (interstitial lung disease) (HCC) 09/04/2014  . Interstitial lung disease (HCC) 06/22/2014  . Upper respiratory infection 12/03/2013  . CAROTID BRUIT, LEFT 05/12/2010  . Hyperlipidemia 07/01/2009  . CAD, NATIVE VESSEL 07/01/2009  . SHORTNESS OF BREATH 07/01/2009  . Atypical chest pain 07/01/2009    Past Surgical History:  Procedure Laterality Date  . CARDIAC CATHETERIZATION    . CORONARY ANGIOPLASTY     stents times 4  . Esophagus stretch    . EYE SURGERY    . LUNG BIOPSY    . TESTICLE SURGERY    . VIDEO ASSISTED THORACOSCOPY (VATS)/THOROCOTOMY Right 11/18/2014   Procedure: VIDEO ASSISTED THORACOSCOPY (VATS)/ wedge resection biopsy;  Surgeon: Hulda Marin, MD;  Location: [facility] ORS;  Service: General;  Laterality: Right;    Prior to Admission medications   Medication Sig Start Date End Date Taking? Authorizing Provider  albuterol (PROVENTIL) (2.5 MG/3ML) 0.083% nebulizer solution Take 3 mLs (2.5 mg total) by nebulization every 6 (six) hours as needed. 05/02/12   Antonieta Iba, MD  ALPRAZolam Prudy Feeler) 0.5 MG tablet Take 1 tablet (0.5 mg total) by mouth 3 (three) times daily as needed for sleep or anxiety. 03/23/16 03/23/17  Emily Filbert, MD  amLODipine-benazepril (LOTREL) 5-10 MG capsule TAKE ONE CAPSULE BY MOUTH DAILY 05/23/16  Antonieta Iba, MD  amLODipine-benazepril (LOTREL) 5-10 MG capsule TAKE ONE CAPSULE BY MOUTH DAILY 05/23/16   Antonieta Iba, MD  aspirin EC 81 MG tablet Take 81 mg by mouth every morning. 08/06/12   Historical Provider, MD  cephALEXin (KEFLEX) 500 MG capsule Take 1 capsule (500 mg total) by mouth 4 (four) times daily. 07/27/16 08/06/16  Orvil Feil, PA-C  Choline Fenofibrate (FENOFIBRIC ACID) 135 MG CPDR Take 1 capsule by mouth daily. 12/15/14   Antonieta Iba, MD  clopidogrel (PLAVIX) 75 MG tablet TAKE ONE TABLET BY MOUTH EVERY  DAY 05/23/16   Antonieta Iba, MD  clopidogrel (PLAVIX) 75 MG tablet TAKE ONE TABLET BY MOUTH EVERY DAY 05/23/16   Antonieta Iba, MD  LYCOPENE PO Take 1 tablet by mouth daily. 05/10/05   Historical Provider, MD  nitroGLYCERIN (NITROSTAT) 0.4 MG SL tablet Place 1 tablet (0.4 mg total) under the tongue every 5 (five) minutes as needed. 05/12/15   Antonieta Iba, MD  Pirfenidone 801 MG TABS Take 801 mg by mouth 3 (three) times daily with meals. 02/29/16   Historical Provider, MD  rosuvastatin (CRESTOR) 10 MG tablet Take 1 tablet (10 mg total) by mouth daily. 05/12/15   Antonieta Iba, MD  rosuvastatin (CRESTOR) 10 MG tablet Take 1 tablet (10 mg total) by mouth daily. 06/19/16   Antonieta Iba, MD  sulfamethoxazole-trimethoprim (BACTRIM DS,SEPTRA DS) 800-160 MG tablet Take 1 tablet by mouth 2 (two) times daily. 08/02/16   Delorise Royals [name], PA-C    Allergies Metoprolol succinate; Niacin; and Sertraline hcl  Family History  Problem Relation Age of Onset  . Coronary artery disease Mother     s/p CABG  . Heart failure Mother   . Heart disease Other   . Depression Other     Social History Social History  Substance Use Topics  . Smoking status: Former Smoker    Packs/day: 0.50    Years: 1.00    Types: Cigarettes    Quit date: 05/09/1968  . Smokeless tobacco: Never Used  . Alcohol use No     Review of Systems  Constitutional: No fever/chills Cardiovascular: no chest pain. Respiratory: no cough. No SOB. Musculoskeletal: Negative for musculoskeletal pain. Skin: Patient has "busted" through the sutures placed in his right forearm. Now area is slightly red, painful, draining clear drainage. Neurological: Negative for headaches, focal weakness or numbness. 10-point ROS otherwise negative.  ____________________________________________   PHYSICAL EXAM:  VITAL SIGNS: ED Triage Vitals  Enc Vitals Group     BP 08/02/16 1733 (!) 164/79     Pulse Rate 08/02/16 1733 77      Resp 08/02/16 1733 18     Temp 08/02/16 1733 98.2 F (36.8 C)     Temp Source 08/02/16 1733 Oral     SpO2 08/02/16 1733 96 %     Weight 08/02/16 1732 164 lb (74.4 kg)     Height 08/02/16 1732 5\' 9"  (1.753 m)     Head Circumference --      Peak Flow --      Pain Score 08/02/16 1732 0     Pain Loc --      Pain Edu? --      Excl. in GC? --      Constitutional: Alert and oriented. Well appearing and in no acute distress. Eyes: Conjunctivae are normal. PERRL. EOMI. Head: Atraumatic. Neck: No stridor.    Cardiovascular: Normal rate, regular rhythm. Normal S1 and S2.  Good peripheral circulation. Respiratory: Normal respiratory effort without tachypnea or retractions. Lungs CTAB. Good air entry to the bases with no decreased or absent breath sounds. Musculoskeletal: Full range of motion to all extremities. No gross deformities appreciated. Neurologic:  Normal speech and language. No gross focal neurologic deficits are appreciated.  Skin:  Skin is warm, dry and intact. No rash noted. 5 sutures in place to a laceration to the right forearm. 4 sutures are missing from original laceration. Areas of missing sutures have dehisced, surrounding mild erythema and edema. Clear drainage is noted. No pustular drainage. No fluctuance or induration. Psychiatric: Mood and affect are normal. Speech and behavior are normal. Patient exhibits appropriate insight and judgement.   ____________________________________________   LABS (all labs ordered are listed, but only abnormal results are displayed)  Labs Reviewed - No data to display ____________________________________________  EKG   ____________________________________________  RADIOLOGY   No results found.  ____________________________________________    PROCEDURES  Procedure(s) performed:    Procedures    Medications - No data to display   ____________________________________________   INITIAL IMPRESSION / ASSESSMENT AND  PLAN / ED COURSE  Pertinent labs & imaging results that were available during my care of the patient were reviewed by me and considered in my medical decision making (see chart for details).  Review of the [place] CSRS was performed in accordance of the [facility] prior to dispensing any controlled drugs.     Patient's diagnosis is consistent with dehisced laceration with surrounding infection. Patient has used arm for heavy lifting and is ruptured for sutures. Dehisced areas are showing mild signs of infection. Patient was originally prescribed Keflex but states he is not taking same.. Patient will be discharged home with prescriptions for Bactrim. Patient is to follow up with primary care as needed or otherwise directed. Patient is given ED precautions to return to the ED for any worsening or new symptoms.     ____________________________________________  FINAL CLINICAL IMPRESSION(S) / ED DIAGNOSES  Final diagnoses:  Visit for wound check  Wound dehiscence  Cellulitis of right upper extremity      NEW MEDICATIONS STARTED DURING THIS VISIT:  New Prescriptions   SULFAMETHOXAZOLE-TRIMETHOPRIM (BACTRIM DS,SEPTRA DS) 800-160 MG TABLET    Take 1 tablet by mouth 2 (two) times daily.        This chart was dictated using voice recognition software/Dragon. Despite best efforts to proofread, errors can occur which can change the meaning. Any change was purely unintentional.    Racheal Patches, PA-C 08/02/16 1823    Emily Filbert, MD 08/02/16 [phone]

## 2016-09-11 DIAGNOSIS — N5082 Scrotal pain: Secondary | ICD-10-CM | POA: Insufficient documentation

## 2016-09-28 ENCOUNTER — Other Ambulatory Visit: Payer: Self-pay | Admitting: Cardiovascular Disease

## 2016-10-24 ENCOUNTER — Emergency Department: Payer: Medicare Other

## 2016-10-24 ENCOUNTER — Other Ambulatory Visit: Payer: Self-pay

## 2016-10-24 ENCOUNTER — Encounter: Payer: Self-pay | Admitting: Emergency Medicine

## 2016-10-24 ENCOUNTER — Emergency Department
Admission: EM | Admit: 2016-10-24 | Discharge: 2016-10-24 | Disposition: A | Discharge status: Home / Self Care | Payer: Medicare Other | Attending: Emergency Medicine | Admitting: Emergency Medicine

## 2016-10-24 DIAGNOSIS — I2510 Atherosclerotic heart disease of native coronary artery without angina pectoris: Secondary | ICD-10-CM | POA: Insufficient documentation

## 2016-10-24 DIAGNOSIS — I110 Hypertensive heart disease with heart failure: Secondary | ICD-10-CM | POA: Insufficient documentation

## 2016-10-24 DIAGNOSIS — J45909 Unspecified asthma, uncomplicated: Secondary | ICD-10-CM | POA: Diagnosis not present

## 2016-10-24 DIAGNOSIS — Z87891 Personal history of nicotine dependence: Secondary | ICD-10-CM | POA: Insufficient documentation

## 2016-10-24 DIAGNOSIS — I509 Heart failure, unspecified: Secondary | ICD-10-CM | POA: Insufficient documentation

## 2016-10-24 DIAGNOSIS — Z79899 Other long term (current) drug therapy: Secondary | ICD-10-CM | POA: Diagnosis not present

## 2016-10-24 DIAGNOSIS — R0789 Other chest pain: Secondary | ICD-10-CM

## 2016-10-24 LAB — CBC
HEMATOCRIT: 41.2 % (ref 40.0–52.0)
HEMOGLOBIN: 14.2 g/dL (ref 13.0–18.0)
MCH: 29.6 pg (ref 26.0–34.0)
MCHC: 34.6 g/dL (ref 32.0–36.0)
MCV: 85.4 fL (ref 80.0–100.0)
Platelets: 209 10*3/uL (ref 150–440)
RBC: 4.82 MIL/uL (ref 4.40–5.90)
RDW: 13.9 % (ref 11.5–14.5)
WBC: 10.2 10*3/uL (ref 3.8–10.6)

## 2016-10-24 LAB — BASIC METABOLIC PANEL
ANION GAP: 6 (ref 5–15)
BUN: 10 mg/dL (ref 6–20)
CHLORIDE: 108 mmol/L (ref 101–111)
CO2: 25 mmol/L (ref 22–32)
Calcium: 9 mg/dL (ref 8.9–10.3)
Creatinine, Ser: 1.11 mg/dL (ref 0.61–1.24)
GFR calc non Af Amer: >60 mL/min (ref >60)
Glucose, Bld: 125 mg/dL — ABNORMAL HIGH (ref 65–99)
POTASSIUM: 4 mmol/L (ref 3.5–5.1)
Sodium: 139 mmol/L (ref 135–145)

## 2016-10-24 LAB — TROPONIN I

## 2016-10-24 NOTE — ED Notes (Signed)
Pt states hx of pulmonary fibrosis x 12 years. States he is seeing a doctor in [place] who wants him to take an experimental drug but he is not taking it. Pt states it's on his R side, states his oxygen levels are always high. Pt states yest R rib pain, worse today. Denies any SOB or difficulty breathing. Alert, oriented, no distress noted. Hooked up to cardiac monitor.

## 2016-10-24 NOTE — ED Triage Notes (Signed)
Right rib pain x 1 day.  Denies SOB.  Patient has history of pulmonary fibrosis.

## 2016-10-24 NOTE — ED Provider Notes (Signed)
[facility] Emergency Department Provider Note  ____________________________________________  Time seen: Approximately 5:10 PM  I have reviewed the triage vital signs and the nursing notes.   HISTORY  Chief Complaint Chest Pain (right rib pain)    HPI Shane Reid. is a 71 y.o. male who complains of right anterior lower chest pain that is sharp, intermittent lasting a minute at a time, nonradiating, no associated symptoms. Worse with leaning forward and crunching up his right side of the chest. No alleviating factors. Not exertional nor pleuritic. Is like muscular pain. Does not feel like heart attack she's had in the past. No hemoptysis. No travel trauma hospitalization surgery or leg swelling. Hurts to push on the right anterior lower chest.    Past Medical History:  Diagnosis Date  . Anxiety   . Asthma   . Bradycardia   . CHF (congestive heart failure) (HCC)   . Chronic Chest Pain   . COPD (chronic obstructive pulmonary disease) (HCC)   . Coronary artery disease    a. 2008 s/p PCI to LAD;  b. 2013 Cath: patent stent->Med Rx;  c. 11/2013 MV: EF 67%, no ischemia/infarct.  . Depression   . GERD (gastroesophageal reflux disease)   . Heart murmur    a. 02/2010 Echo: EF 55-60%, no rwma, Gr 2 DD, triv MR, mildly dil LA, nl RV.  [name] Hypercholesteremia   . Hypertension   . Hypertensive heart disease   . Idiopathic pulmonary fibrosis (HCC)    a. Seen by Dr. Dema Severin 01/2015 and Rx Esbriet - pt cannot afford.  . Other social stressor    a. wife with mental illness     Patient Active Problem List   Diagnosis Date Noted  . Hypertensive heart disease   . Chronic Chest Pain   . Coronary artery disease   . Hypercholesteremia   . 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 IPF (idiopathic pulmonary fibrosis) (HCC) 12/23/2014  . Pre-operative cardiovascular examination 11/05/2014  . ILD (interstitial lung disease)  (HCC) 09/04/2014  . Interstitial lung disease (HCC) 06/22/2014  . Upper respiratory infection 12/03/2013  . CAROTID BRUIT, LEFT 05/12/2010  . Hyperlipidemia 07/01/2009  . CAD, NATIVE VESSEL 07/01/2009  . SHORTNESS OF BREATH 07/01/2009  . Atypical chest pain 07/01/2009     Past Surgical History:  Procedure Laterality Date  . CARDIAC CATHETERIZATION    . CORONARY ANGIOPLASTY     stents times 4  . Esophagus stretch    . EYE SURGERY    . LUNG BIOPSY    . TESTICLE SURGERY    . VIDEO ASSISTED THORACOSCOPY (VATS)/THOROCOTOMY Right 11/18/2014   Procedure: VIDEO ASSISTED THORACOSCOPY (VATS)/ wedge resection biopsy;  Surgeon: Hulda Marin, MD;  Location: [facility] ORS;  Service: General;  Laterality: Right;     Prior to Admission medications   Medication Sig Start Date End Date Taking? Authorizing Provider  albuterol (PROVENTIL) (2.5 MG/3ML) 0.083% nebulizer solution Take 3 mLs (2.5 mg total) by nebulization every 6 (six) hours as needed. 05/02/12   Antonieta Iba, MD  ALPRAZolam Prudy Feeler) 0.5 MG tablet Take 1 tablet (0.5 mg total) by mouth 3 (three) times daily as needed for sleep or anxiety. 03/23/16 03/23/17  Emily Filbert, MD  amLODipine-benazepril (LOTREL) 5-10 MG capsule TAKE ONE CAPSULE BY MOUTH DAILY 05/23/16   Antonieta Iba, MD  amLODipine-benazepril (LOTREL) 5-10 MG capsule TAKE ONE CAPSULE BY MOUTH DAILY 05/23/16   Antonieta Iba, MD  aspirin  EC 81 MG tablet Take 81 mg by mouth every morning. 08/06/12   [provider]  Choline Fenofibrate (FENOFIBRIC ACID) 135 MG CPDR Take 1 capsule by mouth daily. 12/15/14   Antonieta Iba, MD  clopidogrel (PLAVIX) 75 MG tablet TAKE ONE TABLET BY MOUTH EVERY DAY 05/23/16   Antonieta Iba, MD  clopidogrel (PLAVIX) 75 MG tablet TAKE ONE (1) TABLET BY MOUTH EVERY DAY 09/28/16   Antonieta Iba, MD  LYCOPENE PO Take 1 tablet by mouth daily. 05/10/05   [provider]  nitroGLYCERIN (NITROSTAT) 0.4 MG SL tablet Place 1  tablet (0.4 mg total) under the tongue every 5 (five) minutes as needed. 05/12/15   Antonieta Iba, MD  Pirfenidone 801 MG TABS Take 801 mg by mouth 3 (three) times daily with meals. 02/29/16   [provider]  rosuvastatin (CRESTOR) 10 MG tablet Take 1 tablet (10 mg total) by mouth daily. 05/12/15   Antonieta Iba, MD  rosuvastatin (CRESTOR) 10 MG tablet Take 1 tablet (10 mg total) by mouth daily. 06/19/16   Antonieta Iba, MD  sulfamethoxazole-trimethoprim (BACTRIM DS,SEPTRA DS) 800-160 MG tablet Take 1 tablet by mouth 2 (two) times daily. 08/02/16   [name], Delorise Royals, PA-C     Allergies Metoprolol succinate; Niacin; and Sertraline hcl   Family History  Problem Relation Age of Onset  . Coronary artery disease Mother        s/p CABG  . Heart failure Mother   . Heart disease Other   . Depression Other     Social History Social History  Substance Use Topics  . Smoking status: Former Smoker    Packs/day: 0.50    Years: 1.00    Types: Cigarettes    Quit date: 05/09/1968  . Smokeless tobacco: Never Used  . Alcohol use No    Review of Systems  Constitutional:   No fever or chills.  ENT:   No sore throat. No rhinorrhea. Cardiovascular:   Positive chest wall pain as above. Respiratory:   No dyspnea or cough. Gastrointestinal:   Negative for abdominal pain, vomiting and diarrhea.  Musculoskeletal:   Negative for focal pain or swelling All other systems reviewed and are negative except as documented above in ROS and HPI.  ____________________________________________   PHYSICAL EXAM:  VITAL SIGNS: ED Triage Vitals [10/24/16 1304]  Enc Vitals Group     BP 136/71     Pulse Rate 65     Resp 16     Temp 97.8 F (36.6 C)     Temp Source Oral     SpO2 98 %     Weight 170 lb (77.1 kg)     Height 5\' 9"  (1.753 m)     Head Circumference      Peak Flow      Pain Score 2     Pain Loc      Pain Edu?      Excl. in GC?     Vital signs reviewed, nursing  assessments reviewed.   Constitutional:   Alert and oriented. Well appearing and in no distress. Eyes:   No scleral icterus. No conjunctival pallor. PERRL. EOMI.  No nystagmus. ENT   Head:   Normocephalic and atraumatic.   Nose:   No congestion/rhinnorhea.    Mouth/Throat:   MMM, no pharyngeal erythema. No peritonsillar mass.    Neck:   No meningismus. Full ROM Hematological/Lymphatic/Immunilogical:   No cervical lymphadenopathy. Cardiovascular:   RRR. Symmetric bilateral  radial and DP pulses.  No murmurs.  Respiratory:   Normal respiratory effort without tachypnea/retractions. Breath sounds are clear and equal bilaterally. No wheezes/rales/rhonchi.Right lower chest tender to the touch reproducing the pain in the area of indicated pain. Chest wall stable. Gastrointestinal:   Soft and nontender. Non distended. There is no CVA tenderness.  No rebound, rigidity, or guarding. Genitourinary:   deferred Musculoskeletal:   Normal range of motion in all extremities. No joint effusions.  No lower extremity tenderness.  No edema. Neurologic:   Normal speech and language.  Motor grossly intact. No gross focal neurologic deficits are appreciated.  Skin:    Skin is warm, dry and intact. No rash noted.  No petechiae, purpura, or bullae.  ____________________________________________    LABS (pertinent positives/negatives) (all labs ordered are listed, but only abnormal results are displayed) Labs Reviewed  BASIC METABOLIC PANEL - Abnormal; Notable for the following:       Result Value   Glucose, Bld 125 (*)    All other components within normal limits  CBC  TROPONIN I   ____________________________________________   EKG  Interpreted by me  Date: 10/24/2016  Rate: 63  Rhythm: normal sinus rhythm  QRS Axis: normal  Intervals: normal  ST/T Wave abnormalities: normal  Conduction Disutrbances: none  Narrative Interpretation:  unremarkable      ____________________________________________    RADIOLOGY  Dg Chest 2 View  Result Date: 10/24/2016 CLINICAL DATA:  Right-sided chest pain EXAM: CHEST  2 VIEW COMPARISON:  03/23/2016, chest CT 10/29/2015 FINDINGS: The heart size and mediastinal contours are within normal limits. Faintly visualized coronary arterial stents. Chronic subpleural interstitial prominence is again noted likely representing areas of interstitial fibrosis and chronic interstitial lung disease. The visualized skeletal structures are unremarkable. IMPRESSION: Bilateral subpleural interstitial prominence is again noted consistent with chronic interstitial lung disease and likely interstitial fibrosis. No superimposed acute pulmonary abnormality. Electronically Signed   By: Tollie Eth M.D.   On: 10/24/2016 14:51    ____________________________________________   PROCEDURES Procedures  ____________________________________________   INITIAL IMPRESSION / ASSESSMENT AND PLAN / ED COURSE  Pertinent labs & imaging results that were available during my care of the patient were reviewed by me and considered in my medical decision making (see chart for details).  Patient well appearing no acute distress.Considering the patient's symptoms, medical history, and physical examination today, I have low suspicion for ACS, PE, TAD, pneumothorax, carditis, mediastinitis, pneumonia, CHF, or sepsis.  Pain consistent with chest wall pain, likely intercostal strain versus scar tissue related to recent chest tube in that area. No evidence of infection. Workup unremarkable, vitals unremarkable. Discharge home to follow up with primary care.     ____________________________________________   FINAL CLINICAL IMPRESSION(S) / ED DIAGNOSES  Final diagnoses:  Chest wall pain      New Prescriptions   No medications on file     Portions of this note were generated with dragon dictation software. Dictation  errors may occur despite best attempts at proofreading.    Sharman Cheek, MD 10/24/16 [phone]

## 2016-11-15 ENCOUNTER — Encounter: Payer: Self-pay | Admitting: Emergency Medicine

## 2016-11-15 ENCOUNTER — Emergency Department
Admission: EM | Admit: 2016-11-15 | Discharge: 2016-11-15 | Disposition: A | Discharge status: Home / Self Care | Payer: Medicare Other | Attending: Emergency Medicine | Admitting: Emergency Medicine

## 2016-11-15 ENCOUNTER — Emergency Department: Payer: Medicare Other

## 2016-11-15 DIAGNOSIS — I110 Hypertensive heart disease with heart failure: Secondary | ICD-10-CM | POA: Diagnosis not present

## 2016-11-15 DIAGNOSIS — R0602 Shortness of breath: Secondary | ICD-10-CM | POA: Diagnosis present

## 2016-11-15 DIAGNOSIS — J84112 Idiopathic pulmonary fibrosis: Secondary | ICD-10-CM | POA: Insufficient documentation

## 2016-11-15 DIAGNOSIS — J45909 Unspecified asthma, uncomplicated: Secondary | ICD-10-CM | POA: Diagnosis not present

## 2016-11-15 DIAGNOSIS — Z87891 Personal history of nicotine dependence: Secondary | ICD-10-CM | POA: Diagnosis not present

## 2016-11-15 DIAGNOSIS — I509 Heart failure, unspecified: Secondary | ICD-10-CM | POA: Insufficient documentation

## 2016-11-15 DIAGNOSIS — J449 Chronic obstructive pulmonary disease, unspecified: Secondary | ICD-10-CM | POA: Diagnosis not present

## 2016-11-15 DIAGNOSIS — I2510 Atherosclerotic heart disease of native coronary artery without angina pectoris: Secondary | ICD-10-CM | POA: Diagnosis not present

## 2016-11-15 DIAGNOSIS — Z7902 Long term (current) use of antithrombotics/antiplatelets: Secondary | ICD-10-CM | POA: Insufficient documentation

## 2016-11-15 DIAGNOSIS — Z955 Presence of coronary angioplasty implant and graft: Secondary | ICD-10-CM | POA: Diagnosis not present

## 2016-11-15 LAB — CBC
HEMATOCRIT: 39.1 % — AB (ref 40.0–52.0)
Hemoglobin: 13.5 g/dL (ref 13.0–18.0)
MCH: 29.3 pg (ref 26.0–34.0)
MCHC: 34.5 g/dL (ref 32.0–36.0)
MCV: 84.8 fL (ref 80.0–100.0)
Platelets: 228 10*3/uL (ref 150–440)
RBC: 4.61 MIL/uL (ref 4.40–5.90)
RDW: 13.6 % (ref 11.5–14.5)
WBC: 10.7 10*3/uL — ABNORMAL HIGH (ref 3.8–10.6)

## 2016-11-15 LAB — BASIC METABOLIC PANEL
Anion gap: 8 (ref 5–15)
BUN: 8 mg/dL (ref 6–20)
CALCIUM: 9 mg/dL (ref 8.9–10.3)
CO2: 25 mmol/L (ref 22–32)
CREATININE: 1.13 mg/dL (ref 0.61–1.24)
Chloride: 109 mmol/L (ref 101–111)
GFR calc non Af Amer: >60 mL/min (ref >60)
GLUCOSE: 144 mg/dL — AB (ref 65–99)
Potassium: 3.5 mmol/L (ref 3.5–5.1)
Sodium: 142 mmol/L (ref 135–145)

## 2016-11-15 LAB — TROPONIN I: Troponin I: <0.03 ng/mL (ref <0.03)

## 2016-11-15 MED ORDER — BENZONATATE 100 MG PO CAPS
200.0000 mg | ORAL_CAPSULE | Freq: Once | ORAL | Status: AC
  Administered 2016-11-15: 200 mg via ORAL
  Filled 2016-11-15: qty 2

## 2016-11-15 MED ORDER — BENZONATATE 100 MG PO CAPS: 100.0000 mg | ORAL_CAPSULE | Freq: Four times a day (QID) | ORAL | 0 refills | Status: DC | PRN

## 2016-11-15 MED ORDER — PREDNISONE 20 MG PO TABS
20.0000 mg | ORAL_TABLET | ORAL | Status: AC
  Administered 2016-11-15: 20 mg via ORAL
  Filled 2016-11-15: qty 1

## 2016-11-15 MED ORDER — PREDNISONE 20 MG PO TABS: 20.0000 mg | ORAL_TABLET | Freq: Every day | ORAL | 0 refills | Status: DC

## 2016-11-15 MED ORDER — GUAIFENESIN 100 MG/5ML PO SOLN: 5.0000 mL | ORAL | 0 refills | Status: DC | PRN

## 2016-11-15 NOTE — Discharge Instructions (Signed)
Results for orders placed or performed during the hospital encounter of 11/15/16  CBC  Result Value Ref Range   WBC 10.7 (H) 3.8 - 10.6 K/uL   RBC 4.61 4.40 - 5.90 MIL/uL   Hemoglobin 13.5 13.0 - 18.0 g/dL   HCT 67.5 (L) 91.6 - 38.4 %   MCV 84.8 80.0 - 100.0 fL   MCH 29.3 26.0 - 34.0 pg   MCHC 34.5 32.0 - 36.0 g/dL   RDW 66.5 99.3 - 57.0 %   Platelets 228 150 - 440 K/uL  Basic metabolic panel  Result Value Ref Range   Sodium 142 135 - 145 mmol/L   Potassium 3.5 3.5 - 5.1 mmol/L   Chloride 109 101 - 111 mmol/L   CO2 25 22 - 32 mmol/L   Glucose, Bld 144 (H) 65 - 99 mg/dL   BUN 8 6 - 20 mg/dL   Creatinine, Ser 1.77 0.61 - 1.24 mg/dL   Calcium 9.0 8.9 - 93.9 mg/dL   GFR calc non Af Amer >60 >60 mL/min   GFR calc Af Amer >60 >60 mL/min   Anion gap 8 5 - 15  Troponin I  Result Value Ref Range   Troponin I <0.03 <0.03 ng/mL   Dg Chest 2 View  Result Date: 11/15/2016 CLINICAL DATA:  Shortness of breath. EXAM: CHEST  2 VIEW COMPARISON:  Most recent radiographs 10/24/2016. Chest CT 10/29/2015 FINDINGS: Normal heart size and mediastinal contours. Stable chronic lung disease with subpleural reticulation and scattered peripheral honeycombing consistent with pulmonary fibrosis. No superimposed consolidation, pulmonary edema, pleural fluid or pneumothorax. Stable osseous structures. IMPRESSION: Chronic interstitial lung disease. No superimposed acute abnormality. Electronically Signed   By: Rubye Oaks M.D.   On: 11/15/2016 01:30   Dg Chest 2 View  Result Date: 10/24/2016 CLINICAL DATA:  Right-sided chest pain EXAM: CHEST  2 VIEW COMPARISON:  03/23/2016, chest CT 10/29/2015 FINDINGS: The heart size and mediastinal contours are within normal limits. Faintly visualized coronary arterial stents. Chronic subpleural interstitial prominence is again noted likely representing areas of interstitial fibrosis and chronic interstitial lung disease. The visualized skeletal structures are unremarkable.  IMPRESSION: Bilateral subpleural interstitial prominence is again noted consistent with chronic interstitial lung disease and likely interstitial fibrosis. No superimposed acute pulmonary abnormality. Electronically Signed   By: Tollie Eth M.D.   On: 10/24/2016 14:51

## 2016-11-15 NOTE — ED Notes (Signed)

## 2016-11-15 NOTE — ED Triage Notes (Addendum)
Patient ambulatory to triage with steady gait, without difficulty or distress noted; pt very talkative; pt reports SHOB with cough; rx cough med with codeine yesterday but "insurance won't pay for it"

## 2016-11-15 NOTE — ED Provider Notes (Signed)
[facility] Emergency Department Provider Note  ____________________________________________  Time seen: Approximately 4:04 AM  I have reviewed the triage vital signs and the nursing notes.   HISTORY  Chief Complaint Shortness of Breath    HPI Shane Shirk. is a 71 y.o. male who complains of shortness of breath due to the hot weather. This is a chronic issue for him. He has idiopathic pulmonary fibrosis. He saw his pulmonologist 2 days ago for the same symptoms and they prescribed a codeine cough medicine. However, this would cost him $40 with his health plan and he felt like he couldn't afford it so he hasn't taken any medicine and his symptoms have persisted. Denies chest pain. No productive cough. No fevers or chills. Feels better whenever he gets into air conditioning. Otherwise the symptoms are constant waxing and waning, worse with heat.Marrion Coy, MD - 11/13/2016 8:30 AM EDT Formatting of this note may be different from the original. Pulmonary Clinic - Follow-up Visit  HISTORY:   Active Pulmonary Problems & Brief History: Shane Reid is a 71 y.o. male with a history of CAD s/p PCI to the LAD in 2008, prior traumatic injury to the chest, and history of esophageal strictures requiring esophageal dilatations who was previously followed by an outside pulmonologist for biopsy proven UIP. He was referred to this clinic for GI to determine prognosis prior to planning a colonoscopy. We attempted to start him on an antifibrotic agent, which he declined. He is here today for follow up.   Interval History: Last night woke up and couldn't breath. The house that he lives in currently has one window unit that only cools one room in the house. Says he was laying in bed sweating and felt like he couldn't breath. Called a triage nurse at [facility] and decided to wait until coming here today. Finally was able to fall asleep around 3:30am  with two fans blowing on him. Has a thermometer in his hallway that said it was 88 degrees in the hallway at the time.  Prior to last night, has been having more issues with his breathing since it has gotten hot. When the cool breeze comes through his yard, he can go stand under the tree with the breeze and feel better. If he goes to [facility] and gets into the air conditioner, he feels fine; can walk around in the air conditioner without difficulty. Also has a persistent dry cough that he can't get rid of. Went to [employer] a couple of weeks ago because he couldn't get his breath; oxygen there was 100% and a work up didn't show any acute abnormalities.       Past Medical History:  Diagnosis Date  . Anxiety   . Asthma   . Bradycardia   . CHF (congestive heart failure) (HCC)   . Chronic Chest Pain   . COPD (chronic obstructive pulmonary disease) (HCC)   . Coronary artery disease    a. 2008 s/p PCI to LAD;  b. 2013 Cath: patent stent->Med Rx;  c. 11/2013 MV: EF 67%, no ischemia/infarct.  . Depression   . GERD (gastroesophageal reflux disease)   . Heart murmur    a. 02/2010 Echo: EF 55-60%, no rwma, Gr 2 DD, triv MR, mildly dil LA, nl RV.  [name] Hypercholesteremia   . Hypertension   . Hypertensive heart disease   . Idiopathic pulmonary fibrosis (HCC)    a. Seen by Dr. Dema Severin  01/2015 and Rx Esbriet - pt cannot afford.  . Other social stressor    a. wife with mental illness     Patient Active Problem List   Diagnosis Date Noted  . Hypertensive heart disease   . Chronic Chest Pain   . Coronary artery disease   . Hypercholesteremia   . 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 IPF (idiopathic pulmonary fibrosis) (HCC) 12/23/2014  . Pre-operative cardiovascular examination 11/05/2014  . ILD (interstitial lung disease) (HCC) 09/04/2014  . Interstitial lung disease (HCC) 06/22/2014  . Upper respiratory infection 12/03/2013  . CAROTID BRUIT,  LEFT 05/12/2010  . Hyperlipidemia 07/01/2009  . CAD, NATIVE VESSEL 07/01/2009  . SHORTNESS OF BREATH 07/01/2009  . Atypical chest pain 07/01/2009     Past Surgical History:  Procedure Laterality Date  . CARDIAC CATHETERIZATION    . CORONARY ANGIOPLASTY     stents times 4  . Esophagus stretch    . EYE SURGERY    . LUNG BIOPSY    . TESTICLE SURGERY    . VIDEO ASSISTED THORACOSCOPY (VATS)/THOROCOTOMY Right 11/18/2014   Procedure: VIDEO ASSISTED THORACOSCOPY (VATS)/ wedge resection biopsy;  Surgeon: Hulda Marin, MD;  Location: [facility] ORS;  Service: General;  Laterality: Right;     Prior to Admission medications   Medication Sig Start Date End Date Taking? Authorizing Provider  albuterol (PROVENTIL) (2.5 MG/3ML) 0.083% nebulizer solution Take 3 mLs (2.5 mg total) by nebulization every 6 (six) hours as needed. 05/02/12   Antonieta Iba, MD  ALPRAZolam Prudy Feeler) 0.5 MG tablet Take 1 tablet (0.5 mg total) by mouth 3 (three) times daily as needed for sleep or anxiety. 03/23/16 03/23/17  Emily Filbert, MD  amLODipine-benazepril (LOTREL) 5-10 MG capsule TAKE ONE CAPSULE BY MOUTH DAILY 05/23/16   Antonieta Iba, MD  amLODipine-benazepril (LOTREL) 5-10 MG capsule TAKE ONE CAPSULE BY MOUTH DAILY 05/23/16   Antonieta Iba, MD  aspirin EC 81 MG tablet Take 81 mg by mouth every morning. 08/06/12   [provider]  benzonatate (TESSALON PERLES) 100 MG capsule Take 1 capsule (100 mg total) by mouth every 6 (six) hours as needed for cough. 11/15/16 11/15/17  Sharman Cheek, MD  Choline Fenofibrate (FENOFIBRIC ACID) 135 MG CPDR Take 1 capsule by mouth daily. 12/15/14   Antonieta Iba, MD  clopidogrel (PLAVIX) 75 MG tablet TAKE ONE TABLET BY MOUTH EVERY DAY 05/23/16   Antonieta Iba, MD  clopidogrel (PLAVIX) 75 MG tablet TAKE ONE (1) TABLET BY MOUTH EVERY DAY 09/28/16   Antonieta Iba, MD  guaiFENesin (ROBITUSSIN) 100 MG/5ML SOLN Take 5 mLs (100 mg total) by mouth every 4 (four)  hours as needed for cough or to loosen phlegm. 11/15/16   Sharman Cheek, MD  LYCOPENE PO Take 1 tablet by mouth daily. 05/10/05   [provider]  nitroGLYCERIN (NITROSTAT) 0.4 MG SL tablet Place 1 tablet (0.4 mg total) under the tongue every 5 (five) minutes as needed. 05/12/15   Antonieta Iba, MD  Pirfenidone 801 MG TABS Take 801 mg by mouth 3 (three) times daily with meals. 02/29/16   [provider]  predniSONE (DELTASONE) 20 MG tablet Take 1 tablet (20 mg total) by mouth daily. 11/15/16   Sharman Cheek, MD  rosuvastatin (CRESTOR) 10 MG tablet Take 1 tablet (10 mg total) by mouth daily. 05/12/15   Antonieta Iba, MD  rosuvastatin (CRESTOR) 10 MG tablet Take 1 tablet (10 mg total) by mouth  daily. 06/19/16   Antonieta Iba, MD  sulfamethoxazole-trimethoprim (BACTRIM DS,SEPTRA DS) 800-160 MG tablet Take 1 tablet by mouth 2 (two) times daily. 08/02/16   [name], Delorise Royals, PA-C     Allergies Metoprolol succinate; Niacin; and Sertraline hcl   Family History  Problem Relation Age of Onset  . Coronary artery disease Mother        s/p CABG  . Heart failure Mother   . Heart disease Other   . Depression Other     Social History Social History  Substance Use Topics  . Smoking status: Former Smoker    Packs/day: 0.50    Years: 1.00    Types: Cigarettes    Quit date: 05/09/1968  . Smokeless tobacco: Never Used  . Alcohol use No    Review of Systems  Constitutional:   No fever or chills.  ENT:   No sore throat. No rhinorrhea. Cardiovascular:   No chest pain or syncope. Respiratory:   Positive shortness of breath and nonproductive cough. Gastrointestinal:   Negative for abdominal pain, vomiting and diarrhea.  Musculoskeletal:   Negative for focal pain or swelling All other systems reviewed and are negative except as documented above in ROS and HPI.  ____________________________________________   PHYSICAL EXAM:  VITAL SIGNS: ED Triage Vitals   Enc Vitals Group     BP 11/15/16 0059 (!) 113/42     Pulse Rate 11/15/16 0059 65     Resp 11/15/16 0059 18     Temp 11/15/16 0059 98 F (36.7 C)     Temp Source 11/15/16 0059 Oral     SpO2 11/15/16 0059 99 %     Weight 11/15/16 0100 170 lb (77.1 kg)     Height 11/15/16 0100 5\' 9"  (1.753 m)     Head Circumference --      Peak Flow --      Pain Score --      Pain Loc --      Pain Edu? --      Excl. in GC? --     Vital signs reviewed, nursing assessments reviewed.   Constitutional:   Alert and oriented. Well appearing and in no distress. Eyes:   No scleral icterus.  EOMI. No nystagmus. No conjunctival pallor. PERRL. ENT   Head:   Normocephalic and atraumatic.   Nose:   No congestion/rhinnorhea.    Mouth/Throat:   MMM, no pharyngeal erythema. No peritonsillar mass.    Neck:   No meningismus. Full ROM Hematological/Lymphatic/Immunilogical:   No cervical lymphadenopathy. Cardiovascular:   RRR. Symmetric bilateral radial and DP pulses.  No murmurs.  Respiratory:   Normal respiratory effort without tachypnea/retractions. Bibasilar Velcro like crackles. No wheezes. Gastrointestinal:   Soft and nontender. Non distended. There is no CVA tenderness.  No rebound, rigidity, or guarding. Genitourinary:   deferred Musculoskeletal:   Normal range of motion in all extremities. No joint effusions.  No lower extremity tenderness.  No edema. Neurologic:   Normal speech and language.  Motor grossly intact. No gross focal neurologic deficits are appreciated.  Skin:    Skin is warm, dry and intact. No rash noted.  No petechiae, purpura, or bullae.  ____________________________________________    LABS (pertinent positives/negatives) (all labs ordered are listed, but only abnormal results are displayed) Labs Reviewed  CBC - Abnormal; Notable for the following:       Result Value   WBC 10.7 (*)    HCT 39.1 (*)    All other components within  normal limits  BASIC METABOLIC PANEL -  Abnormal; Notable for the following:    Glucose, Bld 144 (*)    All other components within normal limits  TROPONIN I   ____________________________________________   EKG  Interpreted by me  Date: 11/15/2016  Rate: 60  Rhythm: normal sinus rhythm  QRS Axis: normal  Intervals: normal  ST/T Wave abnormalities: normal  Conduction Disutrbances: none  Narrative Interpretation: unremarkable      ____________________________________________    RADIOLOGY  Dg Chest 2 View  Result Date: 11/15/2016 CLINICAL DATA:  Shortness of breath. EXAM: CHEST  2 VIEW COMPARISON:  Most recent radiographs 10/24/2016. Chest CT 10/29/2015 FINDINGS: Normal heart size and mediastinal contours. Stable chronic lung disease with subpleural reticulation and scattered peripheral honeycombing consistent with pulmonary fibrosis. No superimposed consolidation, pulmonary edema, pleural fluid or pneumothorax. Stable osseous structures. IMPRESSION: Chronic interstitial lung disease. No superimposed acute abnormality. Electronically Signed   By: Rubye Oaks M.D.   On: 11/15/2016 01:30    ____________________________________________   PROCEDURES Procedures  ____________________________________________   INITIAL IMPRESSION / ASSESSMENT AND PLAN / ED COURSE  Pertinent labs & imaging results that were available during my care of the patient were reviewed by me and considered in my medical decision making (see chart for details).  Patient well appearing no acute distress, presents with chronic idiopathic pulmonary fibrosis symptoms, worse due to recent seasonal changes and hot weather. The last couple of days the temperature is been in the 90s, particularly exacerbating his symptoms.Considering the patient's symptoms, medical history, and physical examination today, I have low suspicion for ACS, PE, TAD, pneumothorax, carditis, mediastinitis, pneumonia, CHF, or sepsis.  Low-dose prednisone, Tessalon,  guaifenesin, follow-up with primary care.      ____________________________________________   FINAL CLINICAL IMPRESSION(S) / ED DIAGNOSES  Final diagnoses:  SOB (shortness of breath)  IPF (idiopathic pulmonary fibrosis) (HCC)      New Prescriptions   BENZONATATE (TESSALON PERLES) 100 MG CAPSULE    Take 1 capsule (100 mg total) by mouth every 6 (six) hours as needed for cough.   GUAIFENESIN (ROBITUSSIN) 100 MG/5ML SOLN    Take 5 mLs (100 mg total) by mouth every 4 (four) hours as needed for cough or to loosen phlegm.   PREDNISONE (DELTASONE) 20 MG TABLET    Take 1 tablet (20 mg total) by mouth daily.     Portions of this note were generated with dragon dictation software. Dictation errors may occur despite best attempts at proofreading.    Sharman Cheek, MD 11/15/16 0500

## 2016-12-02 ENCOUNTER — Encounter: Payer: Self-pay | Admitting: Emergency Medicine

## 2016-12-02 ENCOUNTER — Emergency Department: Payer: Medicare Other

## 2016-12-02 ENCOUNTER — Emergency Department
Admission: EM | Admit: 2016-12-02 | Discharge: 2016-12-02 | Disposition: A | Discharge status: Home / Self Care | Payer: Medicare Other | Attending: Emergency Medicine | Admitting: Emergency Medicine

## 2016-12-02 DIAGNOSIS — Z7982 Long term (current) use of aspirin: Secondary | ICD-10-CM | POA: Diagnosis not present

## 2016-12-02 DIAGNOSIS — I509 Heart failure, unspecified: Secondary | ICD-10-CM | POA: Diagnosis not present

## 2016-12-02 DIAGNOSIS — J449 Chronic obstructive pulmonary disease, unspecified: Secondary | ICD-10-CM | POA: Diagnosis not present

## 2016-12-02 DIAGNOSIS — Z87891 Personal history of nicotine dependence: Secondary | ICD-10-CM | POA: Diagnosis not present

## 2016-12-02 DIAGNOSIS — R451 Restlessness and agitation: Secondary | ICD-10-CM | POA: Insufficient documentation

## 2016-12-02 DIAGNOSIS — Z7902 Long term (current) use of antithrombotics/antiplatelets: Secondary | ICD-10-CM | POA: Insufficient documentation

## 2016-12-02 DIAGNOSIS — T887XXA Unspecified adverse effect of drug or medicament, initial encounter: Secondary | ICD-10-CM | POA: Diagnosis not present

## 2016-12-02 DIAGNOSIS — I110 Hypertensive heart disease with heart failure: Secondary | ICD-10-CM | POA: Diagnosis not present

## 2016-12-02 DIAGNOSIS — R51 Headache: Secondary | ICD-10-CM | POA: Diagnosis present

## 2016-12-02 DIAGNOSIS — Y658 Other specified misadventures during surgical and medical care: Secondary | ICD-10-CM | POA: Insufficient documentation

## 2016-12-02 DIAGNOSIS — I2510 Atherosclerotic heart disease of native coronary artery without angina pectoris: Secondary | ICD-10-CM | POA: Diagnosis not present

## 2016-12-02 DIAGNOSIS — T50905A Adverse effect of unspecified drugs, medicaments and biological substances, initial encounter: Secondary | ICD-10-CM

## 2016-12-02 DIAGNOSIS — J45909 Unspecified asthma, uncomplicated: Secondary | ICD-10-CM | POA: Insufficient documentation

## 2016-12-02 DIAGNOSIS — Z79899 Other long term (current) drug therapy: Secondary | ICD-10-CM | POA: Insufficient documentation

## 2016-12-02 LAB — CBC
HEMATOCRIT: 41.1 % (ref 40.0–52.0)
HEMOGLOBIN: 14 g/dL (ref 13.0–18.0)
MCH: 29 pg (ref 26.0–34.0)
MCHC: 34.1 g/dL (ref 32.0–36.0)
MCV: 85 fL (ref 80.0–100.0)
Platelets: 210 10*3/uL (ref 150–440)
RBC: 4.83 MIL/uL (ref 4.40–5.90)
RDW: 13.2 % (ref 11.5–14.5)
WBC: 10.3 10*3/uL (ref 3.8–10.6)

## 2016-12-02 LAB — URINALYSIS, COMPLETE (UACMP) WITH MICROSCOPIC
BACTERIA UA: NONE SEEN
Bilirubin Urine: NEGATIVE
GLUCOSE, UA: NEGATIVE mg/dL
Ketones, ur: NEGATIVE mg/dL
NITRITE: NEGATIVE
PH: 6 (ref 5.0–8.0)
Protein, ur: NEGATIVE mg/dL
SPECIFIC GRAVITY, URINE: 1.019 (ref 1.005–1.030)

## 2016-12-02 LAB — BASIC METABOLIC PANEL
ANION GAP: 9 (ref 5–15)
BUN: 8 mg/dL (ref 6–20)
CALCIUM: 9.1 mg/dL (ref 8.9–10.3)
CO2: 23 mmol/L (ref 22–32)
CREATININE: 1.21 mg/dL (ref 0.61–1.24)
Chloride: 107 mmol/L (ref 101–111)
GFR, EST NON AFRICAN AMERICAN: 58 mL/min — AB (ref >60)
Glucose, Bld: 140 mg/dL — ABNORMAL HIGH (ref 65–99)
Potassium: 3.7 mmol/L (ref 3.5–5.1)
SODIUM: 139 mmol/L (ref 135–145)

## 2016-12-02 NOTE — ED Provider Notes (Signed)
[facility] Emergency Department Provider Note  Time seen: 8:39 PM  I have reviewed the triage vital signs and the nursing notes.   HISTORY  Chief Complaint Headache and Shaking    HPI Shane Reid. is a 71 y.o. male with multiple medical issues presents to the emergency department with complaints of agitation feeling restless with some blurred vision. According to the patient he had been off of his venlafaxine for the past one month. He restarted the medication today around 1 PM. He states by 2 PM he is feeling very restless, jittery, anxious. States he was getting very agitated and was having some dizziness or blurred vision. Patient came to the emergency department for evaluation. States his symptoms have since resolved besides some mild blurred vision of that he states much improved from earlier. Denies any focal weakness or numbness. Denies any headache, confusion or slurred speech.  Past Medical History:  Diagnosis Date  . Anxiety   . Asthma   . Bradycardia   . CHF (congestive heart failure) (HCC)   . Chronic Chest Pain   . COPD (chronic obstructive pulmonary disease) (HCC)   . Coronary artery disease    a. 2008 s/p PCI to LAD;  b. 2013 Cath: patent stent->Med Rx;  c. 11/2013 MV: EF 67%, no ischemia/infarct.  . Depression   . GERD (gastroesophageal reflux disease)   . Heart murmur    a. 02/2010 Echo: EF 55-60%, no rwma, Gr 2 DD, triv MR, mildly dil LA, nl RV.  [name] Hypercholesteremia   . Hypertension   . Hypertensive heart disease   . Idiopathic pulmonary fibrosis (HCC)    a. Seen by Dr. Dema Severin 01/2015 and Rx Esbriet - pt cannot afford.  . Other social stressor    a. wife with mental illness    Patient Active Problem List   Diagnosis Date Noted  . Hypertensive heart disease   . Chronic Chest Pain   . Coronary artery disease   . Hypercholesteremia   . 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 IPF  (idiopathic pulmonary fibrosis) (HCC) 12/23/2014  . Pre-operative cardiovascular examination 11/05/2014  . ILD (interstitial lung disease) (HCC) 09/04/2014  . Interstitial lung disease (HCC) 06/22/2014  . Upper respiratory infection 12/03/2013  . CAROTID BRUIT, LEFT 05/12/2010  . Hyperlipidemia 07/01/2009  . CAD, NATIVE VESSEL 07/01/2009  . SHORTNESS OF BREATH 07/01/2009  . Atypical chest pain 07/01/2009    Past Surgical History:  Procedure Laterality Date  . CARDIAC CATHETERIZATION    . CORONARY ANGIOPLASTY     stents times 4  . Esophagus stretch    . EYE SURGERY    . LUNG BIOPSY    . TESTICLE SURGERY    . VIDEO ASSISTED THORACOSCOPY (VATS)/THOROCOTOMY Right 11/18/2014   Procedure: VIDEO ASSISTED THORACOSCOPY (VATS)/ wedge resection biopsy;  Surgeon: Hulda Marin, MD;  Location: [facility] ORS;  Service: General;  Laterality: Right;    Prior to Admission medications   Medication Sig Start Date End Date Taking? Authorizing Provider  albuterol (PROVENTIL) (2.5 MG/3ML) 0.083% nebulizer solution Take 3 mLs (2.5 mg total) by nebulization every 6 (six) hours as needed. 05/02/12   Antonieta Iba, MD  ALPRAZolam Prudy Feeler) 0.5 MG tablet Take 1 tablet (0.5 mg total) by mouth 3 (three) times daily as needed for sleep or anxiety. 03/23/16 03/23/17  Emily Filbert, MD  amLODipine-benazepril (LOTREL) 5-10 MG capsule TAKE ONE CAPSULE BY MOUTH DAILY 05/23/16   Julien Nordmann  J, MD  amLODipine-benazepril (LOTREL) 5-10 MG capsule TAKE ONE CAPSULE BY MOUTH DAILY 05/23/16   Antonieta Iba, MD  aspirin EC 81 MG tablet Take 81 mg by mouth every morning. 08/06/12   [provider]  benzonatate (TESSALON PERLES) 100 MG capsule Take 1 capsule (100 mg total) by mouth every 6 (six) hours as needed for cough. 11/15/16 11/15/17  Sharman Cheek, MD  Choline Fenofibrate (FENOFIBRIC ACID) 135 MG CPDR Take 1 capsule by mouth daily. 12/15/14   Antonieta Iba, MD  clopidogrel (PLAVIX) 75 MG tablet TAKE  ONE TABLET BY MOUTH EVERY DAY 05/23/16   Antonieta Iba, MD  clopidogrel (PLAVIX) 75 MG tablet TAKE ONE (1) TABLET BY MOUTH EVERY DAY 09/28/16   Antonieta Iba, MD  guaiFENesin (ROBITUSSIN) 100 MG/5ML SOLN Take 5 mLs (100 mg total) by mouth every 4 (four) hours as needed for cough or to loosen phlegm. 11/15/16   Sharman Cheek, MD  LYCOPENE PO Take 1 tablet by mouth daily. 05/10/05   [provider]  nitroGLYCERIN (NITROSTAT) 0.4 MG SL tablet Place 1 tablet (0.4 mg total) under the tongue every 5 (five) minutes as needed. 05/12/15   Antonieta Iba, MD  Pirfenidone 801 MG TABS Take 801 mg by mouth 3 (three) times daily with meals. 02/29/16   [provider]  predniSONE (DELTASONE) 20 MG tablet Take 1 tablet (20 mg total) by mouth daily. 11/15/16   Sharman Cheek, MD  rosuvastatin (CRESTOR) 10 MG tablet Take 1 tablet (10 mg total) by mouth daily. 05/12/15   Antonieta Iba, MD  rosuvastatin (CRESTOR) 10 MG tablet Take 1 tablet (10 mg total) by mouth daily. 06/19/16   Antonieta Iba, MD  sulfamethoxazole-trimethoprim (BACTRIM DS,SEPTRA DS) 800-160 MG tablet Take 1 tablet by mouth 2 (two) times daily. 08/02/16   [name], Delorise Royals, PA-C    Allergies  Allergen Reactions  . Metoprolol Succinate Other (See Comments)    Reaction:  Unknown   . Niacin Other (See Comments)    Reaction:  Unknown   . Sertraline Hcl Other (See Comments)    Reaction:  Unknown     Family History  Problem Relation Age of Onset  . Coronary artery disease Mother        s/p CABG  . Heart failure Mother   . Heart disease Other   . Depression Other     Social History Social History  Substance Use Topics  . Smoking status: Former Smoker    Packs/day: 0.50    Years: 1.00    Types: Cigarettes    Quit date: 05/09/1968  . Smokeless tobacco: Never Used  . Alcohol use No    Review of Systems Constitutional: Negative for fever. Cardiovascular: Patient states mild chest discomfort which  she states is chronic, denies any acute worsening. Respiratory: Patient states he is always short of breath due to pulmonary fibrosis. Gastrointestinal: Negative for abdominal pain, vomiting and diarrhea. Genitourinary: Negative for dysuria. Musculoskeletal: Negative for back pain. Skin: Negative for rash. Neurological: Negative for headache All other ROS negative  ____________________________________________   PHYSICAL EXAM:  VITAL SIGNS: ED Triage Vitals  Enc Vitals Group     BP 12/02/16 1737 (!) 158/81     Pulse Rate 12/02/16 1737 78     Resp 12/02/16 1737 16     Temp 12/02/16 1737 97.7 F (36.5 C)     Temp Source 12/02/16 1737 Oral     SpO2 12/02/16 1737 96 %  Weight 12/02/16 1737 160 lb (72.6 kg)     Height 12/02/16 1737 5\' 9"  (1.753 m)     Head Circumference --      Peak Flow --      Pain Score 12/02/16 1742 2     Pain Loc --      Pain Edu? --      Excl. in GC? --     Constitutional: Alert and oriented. Well appearing and in no distress. Eyes: Normal exam ENT   Head: Normocephalic and atraumatic   Mouth/Throat: Mucous membranes are moist. Cardiovascular: Normal rate, regular rhythm. No murmur Respiratory: Normal respiratory effort without tachypnea nor retractions. Breath sounds are clear  Gastrointestinal: Soft and nontender. No distention. Musculoskeletal: Nontender with normal range of motion in all extremities. Neurologic:  Normal speech and language. No gross focal neurologic deficits Skin:  Skin is warm, dry and intact.  Psychiatric: Mood and affect are normal.  ____________________________________________     RADIOLOGY  CT scan of head is negative  ____________________________________________   INITIAL IMPRESSION / ASSESSMENT AND PLAN / ED COURSE  Pertinent labs & imaging results that were available during my care of the patient were reviewed by me and considered in my medical decision making (see chart for details).  Patient  presents to the emergency department with symptoms of agitation blurred vision restlessness which started one hour after taking venlafaxine for the first time in greater than one month. Currently the patient appears well, denies any acute complaints at this time. States he is feeling much better than he was earlier today. Patient's symptoms are suggestive of likely medication reaction. Patient appears well with a normal exam including a normal neurological exam. We will discharge the patient home with primary care follow-up.  ____________________________________________   FINAL CLINICAL IMPRESSION(S) / ED DIAGNOSES  Agitation Medication reaction    Minna Antis, MD 12/02/16 2042

## 2016-12-02 NOTE — ED Triage Notes (Signed)
Patient presents to the ED with headache and blurry vision with irritability.  Patient states he felt that symptoms began after he took an anti-depressant pill around lunch time.  Patient states he hasn't taken the anti-depressant in about 1 month.  Patient states he has pulmonary fibrosis and his doctor told him, "I have the worst kind out of 100 kinds."  Patient states, "I just don't feel right."

## 2016-12-15 NOTE — Discharge Instructions (Signed)
General Anesthesia, Adult, Care After °These instructions provide you with information about caring for yourself after your procedure. Your health care provider may also give you more specific instructions. Your treatment has been planned according to current medical practices, but problems sometimes occur. Call your health care provider if you have any problems or questions after your procedure. °What can I expect after the procedure? °After the procedure, it is common to have: °· Vomiting. °· A sore throat. °· Mental slowness. ° °It is common to feel: °· Nauseous. °· Cold or shivery. °· Sleepy. °· Tired. °· Sore or achy, even in parts of your body where you did not have surgery. ° °Follow these instructions at home: °For at least 24 hours after the procedure: °· Do not: °? Participate in activities where you could fall or become injured. °? Drive. °? Use heavy machinery. °? Drink alcohol. °? Take sleeping pills or medicines that cause drowsiness. °? Make important decisions or sign legal documents. °? Take care of children on your own. °· Rest. °Eating and drinking °· If you vomit, drink water, juice, or soup when you can drink without vomiting. °· Drink enough fluid to keep your urine clear or pale yellow. °· Make sure you have little or no nausea before eating solid foods. °· Follow the diet recommended by your health care provider. °General instructions °· Have a responsible adult stay with you until you are awake and alert. °· Return to your normal activities as told by your health care provider. Ask your health care provider what activities are safe for you. °· Take over-the-counter and prescription medicines only as told by your health care provider. °· If you smoke, do not smoke without supervision. °· Keep all follow-up visits as told by your health care provider. This is important. °Contact a health care provider if: °· You continue to have nausea or vomiting at home, and medicines are not helpful. °· You  cannot drink fluids or start eating again. °· You cannot urinate after 8-12 hours. °· You develop a skin rash. °· You have fever. °· You have increasing redness at the site of your procedure. °Get help right away if: °· You have difficulty breathing. °· You have chest pain. °· You have unexpected bleeding. °· You feel that you are having a life-threatening or urgent problem. °This information is not intended to replace advice given to you by your health care provider. Make sure you discuss any questions you have with your health care provider. °Document Released: 08/21/2000 Document Revised: 10/18/2015 Document Reviewed: 04/29/2015 °[employer]vier Interactive Patient Education © 2018 [employer] ° °

## 2016-12-19 ENCOUNTER — Encounter: Payer: Self-pay | Admitting: *Deleted

## 2016-12-20 NOTE — Anesthesia Preprocedure Evaluation (Addendum)
Anesthesia Evaluation  Patient identified by MRN, date of birth, ID band Patient awake    Reviewed: Allergy & Precautions, NPO status , Patient's Chart, lab work & pertinent test results  Airway Mallampati: II  TM Distance: >3 FB     Dental  (+) Edentulous Upper, Edentulous Lower   Pulmonary shortness of breath, COPD,  COPD inhaler, former smoker,  Pt has biopsy proven idiopathic pulmonary fibrosis.  See Pulm clinic note from June 2018.  PFTs show slight downward trend of mild restriction.   breath sounds clear to auscultation       Cardiovascular hypertension, + CAD and + Cardiac Stents   Rhythm:regular Rate:Normal  TTE 2017 [facility])  Normal left ventricular systolic function, ejection fraction > 55%  Degenerative mitral valve disease  Normal right ventricular systolic function   Neuro/Psych PSYCHIATRIC DISORDERS    GI/Hepatic GERD  ,  Endo/Other    Renal/GU      Musculoskeletal   Abdominal   Peds  Hematology   Anesthesia Other Findings   Reproductive/Obstetrics                            Anesthesia Physical Anesthesia Plan  ASA: III  Anesthesia Plan: General ETT   Post-op Pain Management:    Induction:   PONV Risk Score and Plan: 2 and Ondansetron and Dexamethasone  Airway Management Planned:   Additional Equipment:   Intra-op Plan:   Post-operative Plan:   Informed Consent: I have reviewed the patients History and Physical, chart, labs and discussed the procedure including the risks, benefits and alternatives for the proposed anesthesia with the patient or authorized representative who has indicated his/her understanding and acceptance.     Plan Discussed with: CRNA  Anesthesia Plan Comments:         Anesthesia Quick Evaluation

## 2016-12-21 ENCOUNTER — Ambulatory Visit: Payer: Medicare Other | Admitting: Anesthesiology

## 2016-12-21 ENCOUNTER — Encounter
Admission: RE | Disposition: A | Discharge status: Home / Self Care | Payer: Self-pay | Source: Ambulatory Visit | Attending: Otolaryngology

## 2016-12-21 ENCOUNTER — Ambulatory Visit
Admission: RE | Admit: 2016-12-21 | Discharge: 2016-12-21 | Disposition: A | Discharge status: Home / Self Care | Payer: Medicare Other | Source: Ambulatory Visit | Attending: Otolaryngology | Admitting: Otolaryngology

## 2016-12-21 DIAGNOSIS — J3489 Other specified disorders of nose and nasal sinuses: Secondary | ICD-10-CM | POA: Insufficient documentation

## 2016-12-21 DIAGNOSIS — Z7982 Long term (current) use of aspirin: Secondary | ICD-10-CM | POA: Diagnosis not present

## 2016-12-21 DIAGNOSIS — E785 Hyperlipidemia, unspecified: Secondary | ICD-10-CM | POA: Diagnosis not present

## 2016-12-21 DIAGNOSIS — K219 Gastro-esophageal reflux disease without esophagitis: Secondary | ICD-10-CM | POA: Insufficient documentation

## 2016-12-21 DIAGNOSIS — M950 Acquired deformity of nose: Secondary | ICD-10-CM | POA: Insufficient documentation

## 2016-12-21 DIAGNOSIS — J449 Chronic obstructive pulmonary disease, unspecified: Secondary | ICD-10-CM | POA: Insufficient documentation

## 2016-12-21 DIAGNOSIS — I059 Rheumatic mitral valve disease, unspecified: Secondary | ICD-10-CM | POA: Diagnosis not present

## 2016-12-21 DIAGNOSIS — E7800 Pure hypercholesterolemia, unspecified: Secondary | ICD-10-CM | POA: Diagnosis not present

## 2016-12-21 DIAGNOSIS — I2510 Atherosclerotic heart disease of native coronary artery without angina pectoris: Secondary | ICD-10-CM | POA: Diagnosis not present

## 2016-12-21 DIAGNOSIS — Z87891 Personal history of nicotine dependence: Secondary | ICD-10-CM | POA: Diagnosis not present

## 2016-12-21 DIAGNOSIS — Z955 Presence of coronary angioplasty implant and graft: Secondary | ICD-10-CM | POA: Diagnosis not present

## 2016-12-21 DIAGNOSIS — I10 Essential (primary) hypertension: Secondary | ICD-10-CM | POA: Insufficient documentation

## 2016-12-21 HISTORY — PX: NASAL ENDOSCOPY: SHX6577

## 2016-12-21 SURGERY — ENDOSCOPY, NOSE
Anesthesia: General | Wound class: Clean Contaminated

## 2016-12-21 MED ORDER — LIDOCAINE HCL (CARDIAC) 20 MG/ML IV SOLN
INTRAVENOUS | Status: DC | PRN
  Administered 2016-12-21: 50 mg via INTRATRACHEAL

## 2016-12-21 MED ORDER — FENTANYL CITRATE (PF) 100 MCG/2ML IJ SOLN: 25.0000 ug | INTRAMUSCULAR | Status: DC | PRN

## 2016-12-21 MED ORDER — PHENYLEPHRINE HCL 0.5 % NA SOLN
NASAL | Status: DC | PRN
  Administered 2016-12-21: 13:00:00 via TOPICAL

## 2016-12-21 MED ORDER — OXYCODONE HCL 5 MG PO TABS: 5.0000 mg | ORAL_TABLET | Freq: Once | ORAL | Status: DC | PRN

## 2016-12-21 MED ORDER — OXYMETAZOLINE HCL 0.05 % NA SOLN
2.0000 | Freq: Once | NASAL | Status: AC
  Administered 2016-12-21: 2 via NASAL

## 2016-12-21 MED ORDER — ONDANSETRON HCL 4 MG/2ML IJ SOLN
INTRAMUSCULAR | Status: DC | PRN
  Administered 2016-12-21: 4 mg via INTRAVENOUS

## 2016-12-21 MED ORDER — ACETAMINOPHEN 160 MG/5ML PO SOLN: 325.0000 mg | ORAL | Status: DC | PRN

## 2016-12-21 MED ORDER — LACTATED RINGERS IV SOLN
10.0000 mL/h | INTRAVENOUS | Status: DC
  Administered 2016-12-21: 11:00:00 via INTRAVENOUS
  Administered 2016-12-21: 10 mL/h via INTRAVENOUS

## 2016-12-21 MED ORDER — FENTANYL CITRATE (PF) 100 MCG/2ML IJ SOLN
INTRAMUSCULAR | Status: DC | PRN
Start: 2016-12-21 — End: 2016-12-21
  Administered 2016-12-21: 50 ug via INTRAVENOUS

## 2016-12-21 MED ORDER — MIDAZOLAM HCL 5 MG/5ML IJ SOLN
INTRAMUSCULAR | Status: DC | PRN
  Administered 2016-12-21: 2 mg via INTRAVENOUS

## 2016-12-21 MED ORDER — ACETAMINOPHEN 325 MG PO TABS: 325.0000 mg | ORAL_TABLET | ORAL | Status: DC | PRN

## 2016-12-21 MED ORDER — PROPOFOL 10 MG/ML IV BOLUS
INTRAVENOUS | Status: DC | PRN
  Administered 2016-12-21: 120 mg via INTRAVENOUS

## 2016-12-21 MED ORDER — OXYCODONE HCL 5 MG/5ML PO SOLN: 5.0000 mg | Freq: Once | ORAL | Status: DC | PRN

## 2016-12-21 MED ORDER — DEXAMETHASONE SODIUM PHOSPHATE 4 MG/ML IJ SOLN
INTRAMUSCULAR | Status: DC | PRN
  Administered 2016-12-21: 4 mg via INTRAVENOUS

## 2016-12-21 SURGICAL SUPPLY — 23 items
CANISTER SUCT 1200ML W/VALVE (MISCELLANEOUS) ×3 IMPLANT
CATH IV 18X1 1/4 SAFELET (CATHETERS) ×1 IMPLANT
COAGULATOR SUCT 8FR VV (MISCELLANEOUS) IMPLANT
DRAPE HEAD BAR (DRAPES) ×3 IMPLANT
GLOVE PI ULTRA LF STRL 7.5 (GLOVE) ×2 IMPLANT
GLOVE PI ULTRA NON LATEX 7.5 (GLOVE) ×4
IV CATH 18X1 1/4 SAFELET (CATHETERS)
KIT ROOM TURNOVER OR (KITS) ×3 IMPLANT
NDL ANESTHESIA 27G X 3.5 (NEEDLE) ×1 IMPLANT
NDL HYPO 25GX1X1/2 BEV (NEEDLE) ×1 IMPLANT
NEEDLE ANESTHESIA  27G X 3.5 (NEEDLE)
NEEDLE ANESTHESIA 27G X 3.5 (NEEDLE) IMPLANT
NEEDLE HYPO 25GX1X1/2 BEV (NEEDLE) IMPLANT
PACK DRAPE NASAL/ENT (PACKS) ×3 IMPLANT
PACKING NASAL EPIS 4X2.4 XEROG (MISCELLANEOUS) IMPLANT
PAD GROUND ADULT SPLIT (MISCELLANEOUS) ×3 IMPLANT
PATTIES SURGICAL .5 X3 (DISPOSABLE) ×3 IMPLANT
SOL ANTI-FOG 6CC FOG-OUT (MISCELLANEOUS) ×1 IMPLANT
SOL FOG-OUT ANTI-FOG 6CC (MISCELLANEOUS) ×2
STRAP BODY AND KNEE 60X3 (MISCELLANEOUS) ×3 IMPLANT
SYR 3ML LL SCALE MARK (SYRINGE) ×1 IMPLANT
TOWEL OR 17X26 4PK STRL BLUE (TOWEL DISPOSABLE) ×3 IMPLANT
WATER STERILE IRR 250ML POUR (IV SOLUTION) ×3 IMPLANT

## 2016-12-21 NOTE — Op Note (Signed)
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 The right side there was severe collapsed the entire nasal valve and lateral nasal wall with inspiration.  Procedure: Patient was brought to the operating room placed in supine position. He was given general anesthesia by laryngeal mask anesthesia. Nose visualize his 0 scope on both sides. The lateral nasal wall protruding into the nasal airway in both sides at the junction of the distal upper lateral cartilage and the proximal lower lateral cartilage. This was worse on the right side with collapse the nasal alar as well.  Using the Vivaer handpiece placed against the nasal valve on the right side the energy was applied. He was then done more superiorly and inferiorly to treat the entire nasal valve on the lateral wall. Energy was also applied more caudally over the lower lateral cartilages were was buckling try to stiffen this, as well as inferiorly where the lateral wall and inferior wall meet. This seemed to stiffen the wall some and hold the airway more open. The left side was then visualized and the handpiece was used for fine energy to the nasal valve on the left. This was done in 3 separate areas to remodel the nasal valve laterally.  The patient tolerated the procedure well. He was awakened taken to the recovery room in satisfactory condition. There were no operative complications.  Dispo:   To PACU to be discharged home  Plan:  To follow-up in the office in 1 month to see is doing with the nose and whether the nasal valve is now lateralized and stiffer on the right side prevent  some of the collapse.[name] He will use Vaseline over the treated areas help prevent any crusting or scabbing.  [name] H  12/21/2016 1:45 PM

## 2016-12-21 NOTE — H&P (Signed)
H&P has been reviewedand patient reevaluated,  and no changes necessary. To be downloaded later.  

## 2016-12-21 NOTE — Anesthesia Postprocedure Evaluation (Signed)
Anesthesia Post Note  Patient: Randyn Feland.  Procedure(s) Performed: Procedure(s) (LRB): NASAL ENDOSCOPY (N/A)  Patient location during evaluation: PACU Anesthesia Type: General Level of consciousness: awake Pain management: pain level controlled Vital Signs Assessment: post-procedure vital signs reviewed and stable Respiratory status: spontaneous breathing, nonlabored ventilation and respiratory function stable Cardiovascular status: stable Postop Assessment: no signs of nausea or vomiting Anesthetic complications: no    Jola Babinski

## 2016-12-21 NOTE — Transfer of Care (Signed)
Immediate Anesthesia Transfer of Care Note  Patient: Shane Reid.  Procedure(s) Performed: Procedure(s): NASAL ENDOSCOPY (N/A)  Patient Location: PACU  Anesthesia Type: General ETT  Level of Consciousness: awake, alert  and patient cooperative  Airway and Oxygen Therapy: Patient Spontanous Breathing and Patient connected to supplemental oxygen  Post-op Assessment: Post-op Vital signs reviewed, Patient's Cardiovascular Status Stable, Respiratory Function Stable, Patent Airway and No signs of Nausea or vomiting  Post-op Vital Signs: Reviewed and stable  Complications: No apparent anesthesia complications

## 2016-12-21 NOTE — OR Nursing (Signed)
Vivaer ARC stylus REF [id number] Lot [id number] Model [id number] Exp. 04-28-2017  [facility]  trial

## 2016-12-21 NOTE — Anesthesia Procedure Notes (Signed)
Procedure Name: LMA Insertion Date/Time: 12/21/2016 1:11 PM Performed by: Andee Poles Pre-anesthesia Checklist: Patient identified, Emergency Drugs available, Suction available, Timeout performed and Patient being monitored Patient Re-evaluated:Patient Re-evaluated prior to induction Oxygen Delivery Method: Circle system utilized Preoxygenation: Pre-oxygenation with 100% oxygen Induction Type: IV induction LMA: LMA inserted LMA Size: 4.0 Number of attempts: 1 Placement Confirmation: positive ETCO2 and breath sounds checked- equal and bilateral Tube secured with: Tape

## 2016-12-22 ENCOUNTER — Encounter: Payer: Self-pay | Admitting: Otolaryngology

## 2017-01-01 ENCOUNTER — Other Ambulatory Visit: Payer: Self-pay | Admitting: Cardiovascular Disease

## 2017-01-01 ENCOUNTER — Telehealth: Payer: Self-pay

## 2017-01-01 NOTE — Telephone Encounter (Signed)
l mom to schedule past due f/u appt

## 2017-01-01 NOTE — Telephone Encounter (Signed)
-----   Message from Festus Aloe, CMA sent at 01/01/2017 10:38 AM EDT ----- Please contact patient to follow up with Dr. Mariah Milling. He was to be seen in July 2018

## 2017-01-16 ENCOUNTER — Ambulatory Visit: Payer: Medicare Other | Admitting: Cardiovascular Disease

## 2017-01-26 ENCOUNTER — Telehealth: Payer: Self-pay | Admitting: Cardiovascular Disease

## 2017-01-26 ENCOUNTER — Emergency Department
Admission: EM | Admit: 2017-01-26 | Discharge: 2017-01-26 | Disposition: A | Discharge status: Home / Self Care | Payer: Medicare Other | Attending: Emergency Medicine | Admitting: Emergency Medicine

## 2017-01-26 ENCOUNTER — Telehealth: Payer: Self-pay | Admitting: Physician Assistant

## 2017-01-26 ENCOUNTER — Encounter: Payer: Self-pay | Admitting: Emergency Medicine

## 2017-01-26 ENCOUNTER — Ambulatory Visit: Payer: Medicare Other | Admitting: Cardiovascular Disease

## 2017-01-26 ENCOUNTER — Emergency Department: Payer: Medicare Other

## 2017-01-26 DIAGNOSIS — Z5321 Procedure and treatment not carried out due to patient leaving prior to being seen by health care provider: Secondary | ICD-10-CM | POA: Diagnosis not present

## 2017-01-26 DIAGNOSIS — R0602 Shortness of breath: Secondary | ICD-10-CM | POA: Insufficient documentation

## 2017-01-26 DIAGNOSIS — R079 Chest pain, unspecified: Secondary | ICD-10-CM | POA: Insufficient documentation

## 2017-01-26 LAB — BASIC METABOLIC PANEL
Anion gap: 9 (ref 5–15)
BUN: 12 mg/dL (ref 6–20)
CALCIUM: 9.2 mg/dL (ref 8.9–10.3)
CHLORIDE: 107 mmol/L (ref 101–111)
CO2: 24 mmol/L (ref 22–32)
Creatinine, Ser: 1.41 mg/dL — ABNORMAL HIGH (ref 0.61–1.24)
GFR calc non Af Amer: 49 mL/min — ABNORMAL LOW (ref >60)
GFR, EST AFRICAN AMERICAN: 56 mL/min — AB (ref >60)
Glucose, Bld: 183 mg/dL — ABNORMAL HIGH (ref 65–99)
Potassium: 3.9 mmol/L (ref 3.5–5.1)
Sodium: 140 mmol/L (ref 135–145)

## 2017-01-26 LAB — CBC
HCT: 42.1 % (ref 40.0–52.0)
Hemoglobin: 14.3 g/dL (ref 13.0–18.0)
MCH: 28.8 pg (ref 26.0–34.0)
MCHC: 34.1 g/dL (ref 32.0–36.0)
MCV: 84.6 fL (ref 80.0–100.0)
PLATELETS: 231 10*3/uL (ref 150–440)
RBC: 4.98 MIL/uL (ref 4.40–5.90)
RDW: 13.3 % (ref 11.5–14.5)
WBC: 10.2 10*3/uL (ref 3.8–10.6)

## 2017-01-26 LAB — TROPONIN I

## 2017-01-26 NOTE — ED Triage Notes (Signed)
Pt reports central non radiating chest pain for one week. Pt reports he is always short of breath due to pulmonary fibrosis. Denies any other associated symptoms. Pt ambulatory to triage. No apparent distress noted. Pt reports was out of blood pressure medication two weeks ago but started back last week.

## 2017-01-26 NOTE — Telephone Encounter (Signed)
Spoke w/ pt.  He reports constant chest pain "like an elephant sitting on my chest" for the past week. Attempted to ask pt questions regarding his sx, but he states that Dr. Mariah Milling taught him how to differentiate b/t muscle pain and cardiac pain and states that he is calling to notify us that he is going to the ED in hopes that Dr. Mariah Milling can meet him there.  Of note, pt has longstanding h/o chronic, atypical noncardiac chest pain w/ numerous negative workups.

## 2017-01-26 NOTE — Telephone Encounter (Signed)
Pt c/o of Chest Pain: STAT if CP now or developed within 24 hours  1. Are you having CP right now? Yes, all week  2. Are you experiencing any other symptoms (ex. SOB, nausea, vomiting, sweating)? Palm is sweaty  3. How long have you been experiencing CP? A week  4. Is your CP continuous or coming and going? continual  5. Have you taken Nitroglycerin? 2 last night ?

## 2017-01-26 NOTE — Telephone Encounter (Signed)
Paged by patient's ex wife, patient has h/o PCI remotely. He went to [facility] today. It was difficult to understand his wife, but she appears to be saying that patient "did not like how he was treated" and left ED already, he has been having chest pain. I recommended her to take him back to ED to finish eval, but she says she is taking patient to [facility] ED instead.   Ramond Dial PA Pager: [phone]

## 2017-01-30 ENCOUNTER — Telehealth: Payer: Self-pay | Admitting: Cardiovascular Disease

## 2017-01-30 ENCOUNTER — Other Ambulatory Visit: Payer: Self-pay | Admitting: Cardiovascular Disease

## 2017-01-30 NOTE — Telephone Encounter (Signed)
Pt states he doesn't think his BP is working. States yesterday his BP was 141/83. Please call. States he did go to the ED and he waited in the lobby for a long period of time and then left.

## 2017-01-30 NOTE — Telephone Encounter (Signed)
Spoke with patient and he states that he went to ED but left because they made him wait too long. He reports that they did EKG and xray though and then made him wait. He became very frustrated and just went home. Reviewed with him that labs from that visit look good and it appears he is scheduled to come in on Monday to see Dr. Mariah Milling. He confirmed his appointment information with no further questions at this time.

## 2017-01-31 ENCOUNTER — Other Ambulatory Visit: Payer: Self-pay | Admitting: Cardiovascular Disease

## 2017-02-01 ENCOUNTER — Telehealth: Payer: Self-pay | Admitting: Cardiovascular Disease

## 2017-02-01 NOTE — Telephone Encounter (Signed)
-----   Message from Coralee Rud sent at 02/01/2017  2:05 PM EDT -----   ----- Message ----- From: Festus Aloe, CMA Sent: 02/01/2017   1:08 PM To: Coralee Rud  Please try to contact patient for a follow up.  I sent a refill in for plavix but he is past due for a follow up.  Thanks!  Jasmine December   I though he was still on schedule but he is not.

## 2017-02-01 NOTE — Telephone Encounter (Signed)
Lmov for patient to call back and schedule appt for refills

## 2017-02-04 ENCOUNTER — Emergency Department: Payer: Medicare Other

## 2017-02-04 ENCOUNTER — Emergency Department
Admission: EM | Admit: 2017-02-04 | Discharge: 2017-02-04 | Disposition: A | Discharge status: Home / Self Care | Payer: Medicare Other | Attending: Emergency Medicine | Admitting: Emergency Medicine

## 2017-02-04 DIAGNOSIS — Z79899 Other long term (current) drug therapy: Secondary | ICD-10-CM | POA: Insufficient documentation

## 2017-02-04 DIAGNOSIS — Z87891 Personal history of nicotine dependence: Secondary | ICD-10-CM | POA: Diagnosis not present

## 2017-02-04 DIAGNOSIS — J449 Chronic obstructive pulmonary disease, unspecified: Secondary | ICD-10-CM | POA: Insufficient documentation

## 2017-02-04 DIAGNOSIS — I2510 Atherosclerotic heart disease of native coronary artery without angina pectoris: Secondary | ICD-10-CM | POA: Diagnosis not present

## 2017-02-04 DIAGNOSIS — R4182 Altered mental status, unspecified: Secondary | ICD-10-CM | POA: Diagnosis present

## 2017-02-04 DIAGNOSIS — I119 Hypertensive heart disease without heart failure: Secondary | ICD-10-CM | POA: Diagnosis not present

## 2017-02-04 DIAGNOSIS — J45909 Unspecified asthma, uncomplicated: Secondary | ICD-10-CM | POA: Insufficient documentation

## 2017-02-04 LAB — URINALYSIS, COMPLETE (UACMP) WITH MICROSCOPIC
BACTERIA UA: NONE SEEN
Bilirubin Urine: NEGATIVE
Glucose, UA: NEGATIVE mg/dL
Hgb urine dipstick: NEGATIVE
Ketones, ur: NEGATIVE mg/dL
Leukocytes, UA: NEGATIVE
Nitrite: NEGATIVE
PROTEIN: NEGATIVE mg/dL
Specific Gravity, Urine: 1.021 (ref 1.005–1.030)
pH: 5 (ref 5.0–8.0)

## 2017-02-04 LAB — COMPREHENSIVE METABOLIC PANEL
ALT: 34 U/L (ref 17–63)
AST: 37 U/L (ref 15–41)
Albumin: 3.5 g/dL (ref 3.5–5.0)
Alkaline Phosphatase: 54 U/L (ref 38–126)
Anion gap: 8 (ref 5–15)
BILIRUBIN TOTAL: 0.3 mg/dL (ref 0.3–1.2)
BUN: 11 mg/dL (ref 6–20)
CO2: 23 mmol/L (ref 22–32)
CREATININE: 1.04 mg/dL (ref 0.61–1.24)
Calcium: 8.7 mg/dL — ABNORMAL LOW (ref 8.9–10.3)
Chloride: 108 mmol/L (ref 101–111)
Glucose, Bld: 137 mg/dL — ABNORMAL HIGH (ref 65–99)
POTASSIUM: 3.2 mmol/L — AB (ref 3.5–5.1)
Sodium: 139 mmol/L (ref 135–145)
TOTAL PROTEIN: 7 g/dL (ref 6.5–8.1)

## 2017-02-04 LAB — CBC WITH DIFFERENTIAL/PLATELET
BASOS ABS: 0.1 10*3/uL (ref 0–0.1)
Basophils Relative: 1 %
Eosinophils Absolute: 0.2 10*3/uL (ref 0–0.7)
Eosinophils Relative: 2 %
HEMATOCRIT: 39.3 % — AB (ref 40.0–52.0)
Hemoglobin: 13.5 g/dL (ref 13.0–18.0)
LYMPHS ABS: 2.3 10*3/uL (ref 1.0–3.6)
LYMPHS PCT: 23 %
MCH: 28.8 pg (ref 26.0–34.0)
MCHC: 34.3 g/dL (ref 32.0–36.0)
MCV: 83.9 fL (ref 80.0–100.0)
MONO ABS: 0.9 10*3/uL (ref 0.2–1.0)
MONOS PCT: 9 %
NEUTROS ABS: 6.6 10*3/uL — AB (ref 1.4–6.5)
Neutrophils Relative %: 65 %
Platelets: 221 10*3/uL (ref 150–440)
RBC: 4.68 MIL/uL (ref 4.40–5.90)
RDW: 13.5 % (ref 11.5–14.5)
WBC: 10 10*3/uL (ref 3.8–10.6)

## 2017-02-04 LAB — URINE DRUG SCREEN, QUALITATIVE (ARMC ONLY)
Amphetamines, Ur Screen: NOT DETECTED
Barbiturates, Ur Screen: NOT DETECTED
Benzodiazepine, Ur Scrn: POSITIVE — AB
Cannabinoid 50 Ng, Ur ~~LOC~~: NOT DETECTED
Cocaine Metabolite,Ur ~~LOC~~: NOT DETECTED
MDMA (Ecstasy)Ur Screen: NOT DETECTED
METHADONE SCREEN, URINE: NOT DETECTED
Opiate, Ur Screen: NOT DETECTED
Phencyclidine (PCP) Ur S: NOT DETECTED
Tricyclic, Ur Screen: NOT DETECTED

## 2017-02-04 LAB — ACETAMINOPHEN LEVEL: Acetaminophen (Tylenol), Serum: <10 ug/mL — ABNORMAL LOW (ref 10–30)

## 2017-02-04 LAB — ETHANOL

## 2017-02-04 LAB — SALICYLATE LEVEL: Salicylate Lvl: <7 mg/dL (ref 2.8–30.0)

## 2017-02-04 NOTE — ED Notes (Signed)
Pt awake, moved self on and off ct table without assistance. Pt denies ingestion of drugs or intentional overdose. Pt states the last thing he remembers is taking his cholesterol medication last pm. EMS got a fsbs of 160. Pt denies SI or denies not feeling safe at home or threatened by others.

## 2017-02-04 NOTE — ED Notes (Signed)
Pt provided with phone to call family per pt request.
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
## 2017-02-04 NOTE — ED Triage Notes (Signed)
Pt arrives with [employer] and ems. Per ems and [employer] pt with possible overdose of unknown substance. Ems states pt was combative and with altered mental status on scene. Ems gave haldol 5mg  and versed 2mg . Pt arrives arousable to painful stimuli. No obvious trauma noted. Pt mumbles incomprehensible speech when aroused by painful stimuli.

## 2017-02-04 NOTE — ED Notes (Signed)
Son her to pick pt up

## 2017-02-04 NOTE — ED Notes (Addendum)
Pt's shirt and pants had to be cut on arrival. Cut shirt, pants placed in bag with empty pill bottles. No cellphone, wallet present, jewelry or money/credit cards present. Bag labeled with pt's name.

## 2017-02-04 NOTE — ED Notes (Signed)
Pt resting, resps unlabored. Pt moving all extremities independently.

## 2017-02-04 NOTE — ED Notes (Signed)
Pt continues to sleep. resps unlabored, pt moving self from side to side independently in bed without difficulty.

## 2017-02-04 NOTE — ED Notes (Signed)
Pt continues to sleep, resps unlabored.  

## 2017-02-04 NOTE — ED Notes (Signed)
Pt ambulated with no issues.  

## 2017-02-04 NOTE — ED Notes (Signed)
Report to felicia, rn.  

## 2017-02-04 NOTE — ED Notes (Signed)
Pt sleeping, resps unlabored.  

## 2017-02-04 NOTE — ED Notes (Signed)
Waiting on son to pick pt up. Pt resting comfortably

## 2017-02-05 ENCOUNTER — Ambulatory Visit: Payer: Medicare Other | Admitting: Cardiovascular Disease

## 2017-02-05 NOTE — Telephone Encounter (Signed)
Pt is scheduled for 02/06/17 at 3pm with Ward Givens

## 2017-02-06 ENCOUNTER — Encounter: Payer: Self-pay | Admitting: Nurse Practitioner

## 2017-02-06 ENCOUNTER — Ambulatory Visit (INDEPENDENT_AMBULATORY_CARE_PROVIDER_SITE_OTHER): Payer: Medicare Other | Admitting: Nurse Practitioner

## 2017-02-06 VITALS — BP 126/80 | HR 65 | Ht 69.0 in | Wt 167.8 lb

## 2017-02-06 DIAGNOSIS — I10 Essential (primary) hypertension: Secondary | ICD-10-CM | POA: Diagnosis not present

## 2017-02-06 DIAGNOSIS — I25738 Atherosclerosis of nonautologous biological coronary artery bypass graft(s) with other forms of angina pectoris: Secondary | ICD-10-CM | POA: Diagnosis not present

## 2017-02-06 DIAGNOSIS — E785 Hyperlipidemia, unspecified: Secondary | ICD-10-CM | POA: Diagnosis not present

## 2017-02-06 DIAGNOSIS — R011 Cardiac murmur, unspecified: Secondary | ICD-10-CM

## 2017-02-06 DIAGNOSIS — I209 Angina pectoris, unspecified: Secondary | ICD-10-CM

## 2017-02-06 DIAGNOSIS — R0789 Other chest pain: Secondary | ICD-10-CM

## 2017-02-06 NOTE — Patient Instructions (Addendum)
Medication Instructions:  Please continue your current medications  Labwork: None  Testing/Procedures: Your physician has requested that you have an echocardiogram. Echocardiography is a painless test that uses sound waves to create images of your heart. It provides your doctor with information about the size and shape of your heart and how well your heart's chambers and valves are working. This procedure takes approximately one hour. There are no restrictions for this procedure.  Follow-Up: Your physician wants you to follow-up in: 6 months with Dr. Billey Co will receive a reminder letter in the mail two months in advance.  If you don't receive a letter, please call our office to schedule the follow-up appointment.  If you need a refill on your cardiac medications before your next appointment, please call your pharmacy.  Echocardiogram An echocardiogram, or echocardiography, uses sound waves (ultrasound) to produce an image of your heart. The echocardiogram is simple, painless, obtained within a short period of time, and offers valuable information to your health care provider. The images from an echocardiogram can provide information such as:  Evidence of coronary artery disease (CAD).  Heart size.  Heart muscle function.  Heart valve function.  Aneurysm detection.  Evidence of a past heart attack.  Fluid buildup around the heart.  Heart muscle thickening.  Assess heart valve function.  Tell a health care provider about:  Any allergies you have.  All medicines you are taking, including vitamins, herbs, eye drops, creams, and over-the-counter medicines.  Any problems you or family members have had with anesthetic medicines.  Any blood disorders you have.  Any surgeries you have had.  Any medical conditions you have.  Whether you are pregnant or may be pregnant. What happens before the procedure? No special preparation is needed. Eat and drink normally. What  happens during the procedure?  In order to produce an image of your heart, gel will be applied to your chest and a wand-like tool (transducer) will be moved over your chest. The gel will help transmit the sound waves from the transducer. The sound waves will harmlessly bounce off your heart to allow the heart images to be captured in real-time motion. These images will then be recorded.  You may need an IV to receive a medicine that improves the quality of the pictures. What happens after the procedure? You may return to your normal schedule including diet, activities, and medicines, unless your health care provider tells you otherwise. This information is not intended to replace advice given to you by your health care provider. Make sure you discuss any questions you have with your health care provider. Document Released: 05/12/2000 Document Revised: 01/01/2016 Document Reviewed: 01/20/2013 Elsevier Interactive Patient Education  2017 [employer].

## 2017-02-06 NOTE — Progress Notes (Signed)
Office Visit    Patient Name: Shane Reid. Date of Encounter: 02/06/2017  Primary Care Provider:  Mickel Fuchs, MD Primary Cardiologist:  Concha Se, MD   Chief Complaint    71 year old male with a history of , chronic musculoskeletal chest pain, hypertension, hyperlipidemia, idiopathic pulmonary fibrosis, and systolic murmur, who presents for follow-up related to chest pain.  Past Medical History    Past Medical History:  Diagnosis Date  . Anxiety   . Asthma   . Bradycardia   . CHF (congestive heart failure) (HCC)   . Chronic Chest Pain   . COPD (chronic obstructive pulmonary disease) (HCC)   . Coronary artery disease    a. 2008 s/p PCI to LAD;  b. 2013 Cath: patent stent->Med Rx;  c. 11/2013 MV: EF 67%, no ischemia/infarct.  . Depression   . GERD (gastroesophageal reflux disease)   . Heart murmur    a. 02/2010 Echo: EF 55-60%, no rwma, Gr 2 DD, triv MR, mildly dil LA, nl RV.  [name] Hypercholesteremia   . Hypertension   . Hypertensive heart disease   . Idiopathic pulmonary fibrosis (HCC)    a. Seen by Dr. Dema Severin 01/2015 and Rx Esbriet - pt cannot afford.  . Other social stressor    a. wife with mental illness   Past Surgical History:  Procedure Laterality Date  . CARDIAC CATHETERIZATION    . CORONARY ANGIOPLASTY     stents times 4  . Esophagus stretch    . EYE SURGERY     Bilateral cataracts  . LUNG BIOPSY    . NASAL ENDOSCOPY N/A 12/21/2016   Procedure: NASAL ENDOSCOPY;  Surgeon: Vernie Murders, MD;  Location: [facility];  Service: ENT;  Laterality: N/A;  . TESTICLE SURGERY    . VIDEO ASSISTED THORACOSCOPY (VATS)/THOROCOTOMY Right 11/18/2014   Procedure: VIDEO ASSISTED THORACOSCOPY (VATS)/ wedge resection biopsy;  Surgeon: Hulda Marin, MD;  Location: [facility];  Service: General;  Laterality: Right;    Allergies  Allergies  Allergen Reactions  . Metoprolol Succinate Other (See Comments)    Reaction:  Unknown   . Niacin Other (See Comments)    Reaction:  Unknown   . Sertraline Hcl Other (See Comments)    Reaction:  Unknown     History of Present Illness    71 year old male with the above complex past medical history including CAD, hypertension, hyperlipidemia, atypical most skeletal chest pain, idiopathic pulmonary fibrosis, and systolic murmur. He is status post PCI of the LAD in 2008 with patency of that stent noted in 2013. He had a Myoview in July 2015, which was nonischemic. He has been evaluated multiple times related to atypical and musculoskeletal chest pain. Beginning about a month and a half ago, he began to experience constant chest heaviness that is worse with palpation at the left lower sternal border. There are no associated symptoms. He does have chronic dyspnea on exertion in the setting of pulmonary fibrosis and has not noted any change in this. His chest pain does not worsen with activity. He denies PND, orthopnea, dizziness, syncope, edema, or early satiety. He was seen at the [facility] location on August 19, but left AMA after one negative troponin. He was seen at the [facility] ED on August 31 with chest pain, and left AMA. More recently, he was seen in the [facility] ED secondary to altered mental status after taking Xanax. He was later discharged home. He is continued to have chest pain despite  taking over-the-counter analgesics.  Home Medications    Prior to Admission medications   Medication Sig Start Date End Date Taking? Authorizing Provider  albuterol (PROVENTIL) (2.5 MG/3ML) 0.083% nebulizer solution Take 3 mLs (2.5 mg total) by nebulization every 6 (six) hours as needed. 05/02/12  Yes Gollan, Tollie Pizza, MD  ALPRAZolam Prudy Feeler) 0.5 MG tablet Take 1 tablet (0.5 mg total) by mouth 3 (three) times daily as needed for sleep or anxiety. 03/23/16 03/23/17 Yes Emily Filbert, MD  amLODipine-benazepril (LOTREL) 5-10 MG capsule TAKE ONE CAPSULE BY MOUTH DAILY 05/23/16  Yes Antonieta Iba, MD  aspirin EC 81 MG tablet  Take 81 mg by mouth every morning. 08/06/12  Yes [provider]  clopidogrel (PLAVIX) 75 MG tablet TAKE ONE (1) TABLET EACH DAY 02/01/17  Yes Gollan, Tollie Pizza, MD  guaiFENesin (ROBITUSSIN) 100 MG/5ML SOLN Take 5 mLs (100 mg total) by mouth every 4 (four) hours as needed for cough or to loosen phlegm. 11/15/16  Yes Sharman Cheek, MD  Multiple Vitamins-Minerals (CENTRUM SILVER PO) Take by mouth daily.   Yes [provider]  nitroGLYCERIN (NITROSTAT) 0.4 MG SL tablet Place 1 tablet (0.4 mg total) under the tongue every 5 (five) minutes as needed. 05/12/15  Yes Antonieta Iba, MD  rosuvastatin (CRESTOR) 10 MG tablet Take 1 tablet (10 mg total) by mouth daily. 05/12/15  Yes Antonieta Iba, MD    Review of Systems    As above, he has had constant chest pain involving the left lower sternal border for the past month and a half. He has chronic dyspnea on exertion. He denies PND, orthopnea, palpitations, dizziness, syncope, edema, or early satiety.  All other systems reviewed and are otherwise negative except as noted above.  Physical Exam    VS:  BP 126/80 (BP Location: Left Arm, Patient Position: Sitting, Cuff Size: Normal)   Pulse 65   Ht  (1.753 m)   Wt 167 lb 12 oz (76.1 kg)   SpO2 96%   BMI 24.77 kg/m  , BMI Body mass index is 24.77 kg/m. GEN: Well nourished, well developed, in no acute distress.  HEENT: normal.  Neck: Supple, no JVD, carotid bruits, or masses. Cardiac: RRR, 3/6 systolic ejection murmur throughout her sternal border, no rubs, or gallops. No clubbing, cyanosis, edema.  Radials/DP/PT 2+ and equal bilaterally.  Worsening of left chest pain with palpation along the left lower sternal border. Respiratory:  Respirations regular and unlabored,  crackles about halfway up on the right with crackles in the left base. GI: Soft, nontender, nondistended, BS + x 4. MS: no deformity or atrophy. Skin: warm and dry, no rash. Neuro:  Strength and sensation are  intact. Psych: Normal affect.  Accessory Clinical Findings    ECG - Regular sinus rhythm, 65, LDH with nonspecific T changes. No acute changes.  Assessment & Plan    1.  Chronic atypical chest pain/CAD: Patient with prior history of coronary artery disease status post LAD stenting in 2008 with patent stent present 13. He had a nonischemic Myoview in July 2015. He has chronic chest wall pain which is reproducible with palpation at the left lower sternal border. This is been constant over the past month. He's been seen in 2 different emergency rooms with normal troponins despite persistent chest pain. A chest x-ray was performed during most recent ER visit and did not show any fractures. I will not pursue any further ischemic evaluation and have recommended over-the-counter oral analgesics. He  otherwise remains on low-dose aspirin, Plavix, and statin therapy.  2. Systolic murmur: Patient with a loud systolic murmur. Last echo was in 2011 and did not show any significant valvular disease. He does have chronic dyspnea on exertion in the setting of pulmonary fibrosis. Question possible contribution of valvular disease. I will follow-up echocardiogram.  3. Essential hypertension: Blood pressure stable today on calcium channel blocker therapy.  4. Hyperlipidemia: He remains on statin therapy.  It does not appear that he has had lipids checked since December 2011. LDL was 29 at that time. LFTs were normal on September 9 of this month. We'll need to arrange for follow-up fasting lipids at his next visit.   5. Pulmonary fibrosis: Followed by pulmonology. He has chronic stable dyspnea on exertion.   6. Disposition: Follow-up echocardiogram. Follow-up with Dr. Mariah Milling in 6 months or sooner if necessary.   Nicolasa Ducking, NP 02/06/2017, 4:31 PM

## 2017-02-15 ENCOUNTER — Other Ambulatory Visit: Payer: Self-pay

## 2017-02-15 ENCOUNTER — Ambulatory Visit (INDEPENDENT_AMBULATORY_CARE_PROVIDER_SITE_OTHER): Payer: Medicare Other

## 2017-02-15 DIAGNOSIS — R011 Cardiac murmur, unspecified: Secondary | ICD-10-CM | POA: Diagnosis not present

## 2017-03-01 ENCOUNTER — Telehealth: Payer: Self-pay | Admitting: Cardiovascular Disease

## 2017-03-01 ENCOUNTER — Other Ambulatory Visit: Payer: Self-pay | Admitting: Cardiovascular Disease

## 2017-03-01 DIAGNOSIS — N401 Enlarged prostate with lower urinary tract symptoms: Secondary | ICD-10-CM

## 2017-03-01 DIAGNOSIS — R339 Retention of urine, unspecified: Secondary | ICD-10-CM | POA: Insufficient documentation

## 2017-03-01 DIAGNOSIS — N138 Other obstructive and reflux uropathy: Secondary | ICD-10-CM | POA: Insufficient documentation

## 2017-03-01 DIAGNOSIS — N5319 Other ejaculatory dysfunction: Secondary | ICD-10-CM | POA: Insufficient documentation

## 2017-03-01 MED ORDER — CLOPIDOGREL BISULFATE 75 MG PO TABS: ORAL_TABLET | ORAL | 3 refills | Status: DC

## 2017-03-01 MED ORDER — AMLODIPINE BESY-BENAZEPRIL HCL 5-10 MG PO CAPS: 1.0000 | ORAL_CAPSULE | Freq: Every day | ORAL | 3 refills | Status: DC

## 2017-03-01 MED ORDER — NITROGLYCERIN 0.4 MG SL SUBL: 0.4000 mg | SUBLINGUAL_TABLET | SUBLINGUAL | 1 refills | Status: DC | PRN

## 2017-03-01 NOTE — Telephone Encounter (Signed)
°*  STAT* If patient is at the pharmacy, call can be transferred to refill team.   1. Which medications need to be refilled? (please list name of each medication and dose if known)   Nitrostat 0.4 mg SL q 5 min PRN    Plavix 75 mg po daily  crestor 10 mg po daily   lotrel 5/10 mg po daily         2. Which pharmacy/location (including street and city if local pharmacy) is medication to be sent to?   [facility]   3. Do they need a 30 day or 90 day supply? 30

## 2017-05-25 ENCOUNTER — Emergency Department: Payer: Medicare Other

## 2017-05-25 ENCOUNTER — Encounter: Payer: Self-pay | Admitting: Emergency Medicine

## 2017-05-25 ENCOUNTER — Emergency Department
Admission: EM | Admit: 2017-05-25 | Discharge: 2017-05-25 | Discharge status: Against Medical Advice | Payer: Medicare Other | Attending: Emergency Medicine | Admitting: Emergency Medicine

## 2017-05-25 DIAGNOSIS — J449 Chronic obstructive pulmonary disease, unspecified: Secondary | ICD-10-CM | POA: Insufficient documentation

## 2017-05-25 DIAGNOSIS — R55 Syncope and collapse: Secondary | ICD-10-CM | POA: Diagnosis present

## 2017-05-25 DIAGNOSIS — Z87891 Personal history of nicotine dependence: Secondary | ICD-10-CM | POA: Diagnosis not present

## 2017-05-25 DIAGNOSIS — Z7982 Long term (current) use of aspirin: Secondary | ICD-10-CM | POA: Insufficient documentation

## 2017-05-25 DIAGNOSIS — A419 Sepsis, unspecified organism: Secondary | ICD-10-CM

## 2017-05-25 DIAGNOSIS — I509 Heart failure, unspecified: Secondary | ICD-10-CM | POA: Diagnosis not present

## 2017-05-25 DIAGNOSIS — I110 Hypertensive heart disease with heart failure: Secondary | ICD-10-CM | POA: Diagnosis not present

## 2017-05-25 DIAGNOSIS — I2510 Atherosclerotic heart disease of native coronary artery without angina pectoris: Secondary | ICD-10-CM | POA: Diagnosis not present

## 2017-05-25 DIAGNOSIS — J45909 Unspecified asthma, uncomplicated: Secondary | ICD-10-CM | POA: Insufficient documentation

## 2017-05-25 DIAGNOSIS — Z79899 Other long term (current) drug therapy: Secondary | ICD-10-CM | POA: Diagnosis not present

## 2017-05-25 DIAGNOSIS — Z7901 Long term (current) use of anticoagulants: Secondary | ICD-10-CM | POA: Diagnosis not present

## 2017-05-25 LAB — CBC
HCT: 38.3 % — ABNORMAL LOW (ref 40.0–52.0)
Hemoglobin: 12.9 g/dL — ABNORMAL LOW (ref 13.0–18.0)
MCH: 28.4 pg (ref 26.0–34.0)
MCHC: 33.6 g/dL (ref 32.0–36.0)
MCV: 84.5 fL (ref 80.0–100.0)
PLATELETS: 200 10*3/uL (ref 150–440)
RBC: 4.54 MIL/uL (ref 4.40–5.90)
RDW: 13.8 % (ref 11.5–14.5)
WBC: 12.4 10*3/uL — AB (ref 3.8–10.6)

## 2017-05-25 LAB — TROPONIN I: Troponin I: <0.03 ng/mL (ref <0.03)

## 2017-05-25 LAB — URINE DRUG SCREEN, QUALITATIVE (ARMC ONLY)
Amphetamines, Ur Screen: NOT DETECTED
BARBITURATES, UR SCREEN: NOT DETECTED
BENZODIAZEPINE, UR SCRN: POSITIVE — AB
CANNABINOID 50 NG, UR ~~LOC~~: NOT DETECTED
Cocaine Metabolite,Ur ~~LOC~~: NOT DETECTED
MDMA (Ecstasy)Ur Screen: NOT DETECTED
Methadone Scn, Ur: NOT DETECTED
OPIATE, UR SCREEN: NOT DETECTED
PHENCYCLIDINE (PCP) UR S: NOT DETECTED
Tricyclic, Ur Screen: NOT DETECTED

## 2017-05-25 LAB — URINALYSIS, ROUTINE W REFLEX MICROSCOPIC
BILIRUBIN URINE: NEGATIVE
Bacteria, UA: NONE SEEN
Glucose, UA: NEGATIVE mg/dL
Ketones, ur: NEGATIVE mg/dL
Nitrite: NEGATIVE
Protein, ur: NEGATIVE mg/dL
SPECIFIC GRAVITY, URINE: 1.021 (ref 1.005–1.030)
SQUAMOUS EPITHELIAL / LPF: NONE SEEN
pH: 6 (ref 5.0–8.0)

## 2017-05-25 LAB — BASIC METABOLIC PANEL
Anion gap: 5 (ref 5–15)
BUN: 17 mg/dL (ref 6–20)
CHLORIDE: 108 mmol/L (ref 101–111)
CO2: 22 mmol/L (ref 22–32)
CREATININE: 1.17 mg/dL (ref 0.61–1.24)
Calcium: 8 mg/dL — ABNORMAL LOW (ref 8.9–10.3)
GFR calc Af Amer: >60 mL/min (ref >60)
GFR calc non Af Amer: >60 mL/min (ref >60)
Glucose, Bld: 171 mg/dL — ABNORMAL HIGH (ref 65–99)
Potassium: 3.6 mmol/L (ref 3.5–5.1)
SODIUM: 135 mmol/L (ref 135–145)

## 2017-05-25 MED ORDER — AZITHROMYCIN 500 MG IV SOLR: 500.0000 mg | Freq: Once | INTRAVENOUS | Status: DC

## 2017-05-25 MED ORDER — DEXTROSE 5 % IV SOLN: 500.0000 mg | INTRAVENOUS | Status: DC

## 2017-05-25 MED ORDER — CEFTRIAXONE SODIUM IN DEXTROSE 20 MG/ML IV SOLN: 1.0000 g | Freq: Once | INTRAVENOUS | Status: DC

## 2017-05-25 MED ORDER — DEXTROSE 5 % IV SOLN: 1.0000 g | INTRAVENOUS | Status: DC

## 2017-05-25 NOTE — ED Notes (Signed)
Dr. Sheryle Hail is at bedside conducting his assessment and the Pt states: "I want to go home, I am good and I want to sleep on my F**ng bed." The contraindications of leaving the hospital were explained to the Pt by Dr. Sheryle Hail and the Pt stated that he wants to leave. Pt is AOx4 in no apparent distress.

## 2017-05-25 NOTE — ED Notes (Signed)
Pt went to CT

## 2017-05-25 NOTE — ED Provider Notes (Signed)
[facility] Emergency Department Provider Note   First MD Initiated Contact with Patient 05/25/17 0004     (approximate)  I have reviewed the triage vital signs and the nursing notes.   HISTORY  Chief Complaint Loss of Consciousness   HPI Shane Skenandore. is a 71 y.o. male presents to the emergency department [facility] [place] [place] [facility] with initial call by the patient's wife for cardiac arrest.  EMS states on their arrival patient was minimally responsive with a palpable pulse.  Patient subsequently became combative and remained combative requiring Haldol and Versed before arrival to the emergency department.  The patient presented to the emergency department cooperative and conversant.  Patient states that "my heart did not stop" she just called to get me out of the house".  He responded "she probably wish I was dead that she says it all the time".  Patient denies any complaints at present.   Past Medical History:  Diagnosis Date  . Anxiety   . Asthma   . Bradycardia   . CHF (congestive heart failure) (HCC)   . Chronic Chest Pain   . COPD (chronic obstructive pulmonary disease) (HCC)   . Coronary artery disease    a. 2008 s/p PCI to LAD;  b. 2013 Cath: patent stent->Med Rx;  c. 11/2013 MV: EF 67%, no ischemia/infarct.  . Depression   . GERD (gastroesophageal reflux disease)   . Heart murmur    a. 02/2010 Echo: EF 55-60%, no rwma, Gr 2 DD, triv MR, mildly dil LA, nl RV.  [name] Hypercholesteremia   . Hypertension   . Hypertensive heart disease   . Idiopathic pulmonary fibrosis (HCC)    a. Seen by Dr. Dema Severin 01/2015 and Rx Esbriet - pt cannot afford.  . Other social stressor    a. wife with mental illness    Patient Active Problem List   Diagnosis Date Noted  . Hypertensive heart disease   . Chronic Chest Pain   . Coronary artery disease   . Hypercholesteremia   . 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 IPF  (idiopathic pulmonary fibrosis) (HCC) 12/23/2014  . Pre-operative cardiovascular examination 11/05/2014  . ILD (interstitial lung disease) (HCC) 09/04/2014  . Interstitial lung disease (HCC) 06/22/2014  . Upper respiratory infection 12/03/2013  . CAROTID BRUIT, LEFT 05/12/2010  . Hyperlipidemia 07/01/2009  . CAD, NATIVE VESSEL 07/01/2009  . SHORTNESS OF BREATH 07/01/2009  . Atypical chest pain 07/01/2009    Past Surgical History:  Procedure Laterality Date  . CARDIAC CATHETERIZATION    . CORONARY ANGIOPLASTY     stents times 4  . Esophagus stretch    . EYE SURGERY     Bilateral cataracts  . LUNG BIOPSY    . NASAL ENDOSCOPY N/A 12/21/2016   Procedure: NASAL ENDOSCOPY;  Surgeon: Vernie Murders, MD;  Location: [facility];  Service: ENT;  Laterality: N/A;  . TESTICLE SURGERY    . VIDEO ASSISTED THORACOSCOPY (VATS)/THOROCOTOMY Right 11/18/2014   Procedure: VIDEO ASSISTED THORACOSCOPY (VATS)/ wedge resection biopsy;  Surgeon: Hulda Marin, MD;  Location: [facility];  Service: General;  Laterality: Right;    Prior to Admission medications   Medication Sig Start Date End Date Taking? Authorizing Provider  albuterol (PROVENTIL) (2.5 MG/3ML) 0.083% nebulizer solution Take 3 mLs (2.5 mg total) by nebulization every 6 (six) hours as needed. 05/02/12  Yes Gollan, Tollie Pizza, MD  amLODipine-benazepril (LOTREL) 5-10 MG capsule Take 1 capsule by mouth  daily. 03/01/17  Yes Antonieta IbaGollan, [name], MD  aspirin EC 81 MG tablet Take 81 mg by mouth every morning. 08/06/12  Yes [provider]  clopidogrel (PLAVIX) 75 MG tablet TAKE ONE (1) TABLET EACH DAY 03/01/17  Yes Gollan, Tollie Pizzaimothy J, MD  Multiple Vitamins-Minerals (CENTRUM SILVER PO) Take by mouth daily.   Yes [provider]  nitroGLYCERIN (NITROSTAT) 0.4 MG SL tablet Place 1 tablet (0.4 mg total) under the tongue every 5 (five) minutes as needed. 03/01/17  Yes Gollan, Tollie Pizzaimothy J, MD  rosuvastatin (CRESTOR) 10 MG tablet Take 1 tablet (10  mg total) by mouth daily. 05/12/15  Yes Gollan, Tollie Pizzaimothy J, MD  guaiFENesin (ROBITUSSIN) 100 MG/5ML SOLN Take 5 mLs (100 mg total) by mouth every 4 (four) hours as needed for cough or to loosen phlegm. Patient not taking: Reported on 05/25/2017 11/15/16   Sharman CheekStafford, Phillip, MD  rosuvastatin (CRESTOR) 10 MG tablet TAKE ONE (1) TABLET EACH DAY Patient not taking: Reported on 05/25/2017 03/01/17   Antonieta IbaGollan, [name], MD    Allergies Metoprolol succinate; Niacin; and Sertraline hcl  Family History  Problem Relation Age of Onset  . Coronary artery disease Mother        s/p CABG  . Heart failure Mother   . Heart disease Other   . Depression Other     Social History Social History   Tobacco Use  . Smoking status: Former Smoker    Packs/day: 0.50    Years: 1.00    Pack years: 0.50    Types: Cigarettes    Last attempt to quit: 05/09/1968    Years since quitting: 49.0  . Smokeless tobacco: Never Used  Substance Use Topics  . Alcohol use: No  . Drug use: No    Review of Systems Constitutional: No fever/chills Eyes: No visual changes. ENT: No sore throat. Cardiovascular: Denies chest pain. Respiratory: Denies shortness of breath. Gastrointestinal: No abdominal pain.  No nausea, no vomiting.  No diarrhea.  No constipation. Genitourinary: Negative for dysuria. Musculoskeletal: Negative for neck pain.  Negative for back pain. Integumentary: Negative for rash. Neurological: Negative for headaches, focal weakness or numbness.   ____________________________________________   PHYSICAL EXAM:  VITAL SIGNS: ED Triage Vitals  Enc Vitals Group     BP 05/25/17 0016 127/73     Pulse Rate 05/25/17 0016 81     Resp 05/25/17 0016 (!) 28     Temp 05/25/17 0016 (!) 100.4 F (38 C)     Temp Source 05/25/17 0016 Oral     SpO2 05/25/17 0016 94 %     Weight 05/25/17 0017 72.6 kg (160 lb)     Height 05/25/17 0017 1.829 m (6')     Head Circumference --      Peak Flow --      Pain Score --       Pain Loc --      Pain Edu? --      Excl. in GC? --     Constitutional: Alert and oriented. Well appearing and in no acute distress. Eyes: Conjunctivae are normal. PERRL. EOMI. Head: Atraumatic. Mouth/Throat: Mucous membranes are moist.  Oropharynx non-erythematous. Neck: No stridor.   Cardiovascular: Normal rate, regular rhythm. Good peripheral circulation. Grossly normal heart sounds. Respiratory: Normal respiratory effort.  No retractions. Lungs CTAB. Gastrointestinal: Soft and nontender. No distention.  Musculoskeletal: No lower extremity tenderness nor edema. No gross deformities of extremities. Neurologic:  Normal speech and language. No gross focal neurologic deficits are appreciated.  Skin:  Skin is warm, dry and intact. No rash noted. Psychiatric: Mood and affect are normal. Speech and behavior are normal.  ____________________________________________   LABS (all labs ordered are listed, but only abnormal results are displayed)  Labs Reviewed  BASIC METABOLIC PANEL - Abnormal; Notable for the following components:      Result Value   Glucose, Bld 171 (*)    Calcium 8.0 (*)    All other components within normal limits  CBC - Abnormal; Notable for the following components:   WBC 12.4 (*)    Hemoglobin 12.9 (*)    HCT 38.3 (*)    All other components within normal limits  TROPONIN I   ____________________________________________  EKG  ED ECG REPORT I, [place] N Zimal Weisensel, the attending physician, personally viewed and interpreted this ECG.   Date: 05/25/2017  EKG Time: 12:12 AM  Rate: 84  Rhythm: Normal sinus rhythm  Axis: Normal  Intervals: Normal  ST&T Change: None  ____________________________________________  RADIOLOGY I, [place] N Dorcas Melito, personally viewed and evaluated these images (plain radiographs) as part of my medical decision making, as well as reviewing the written report by the radiologist.  Dg Chest Port 1 View  Result Date:  05/25/2017 CLINICAL DATA:  71 year old male with unresponsiveness. EXAM: PORTABLE CHEST 1 VIEW COMPARISON:  Chest radiograph dated 01/26/2017 FINDINGS: There is shallow inspiration with bibasilar atelectatic changes. Mild diffuse interstitial coarsening, chronic. No focal consolidation, pleural effusion, or pneumothorax. Slight prominence of the central vasculature may represent mild congestion. The cardiac silhouette is within normal limits. No acute osseous pathology. IMPRESSION: 1. Shallow inspiration with bibasilar atelectasis. No definite focal consolidation. 2. Chronic interstitial coarsening. Electronically Signed   By: Elgie Collard M.D.   On: 05/25/2017 00:41     Procedures   ____________________________________________   INITIAL IMPRESSION / ASSESSMENT AND PLAN / ED COURSE  As part of my medical decision making, I reviewed the following data within the electronic MEDICAL RECORD [id number] year old male presenting the emergency department above-stated history and physical exam patient noted to be febrile on presentation with temperature 1 patient patient tachypneic with respiratory rate of 28.  Laboratory data notable for an elevated white blood cell count of 12.4 additional laboratory data unremarkable.  Concern for possible sepsis given fever tachypnea and leukocytosis and as such appropriate laboratory data was obtained.  Patient discussed with Dr. Sheryle Hail for hospital admission for further evaluation and management. ____________________________________________  FINAL CLINICAL IMPRESSION(S) / ED DIAGNOSES  Final diagnoses:  Sepsis, due to unspecified organism [facility])     MEDICATIONS GIVEN DURING THIS VISIT:  Medications - No data to display   ED Discharge Orders    None       Note:  This document was prepared using Dragon voice recognition software and may include unintentional dictation errors.    Darci Current, MD 05/25/17 [phone]

## 2017-05-25 NOTE — Progress Notes (Signed)
Called by ED to admit secondary to questionable cardiac arrest. EMS had been called by the patient's wife due to unresponsiveness. EMS reports that the patient looked dead. They initiated CPR but the patient aroused and became combative. He received Haldol and Ativan which improved his mental status. Upon review of labs patient met criteria for sepsis (fever, tachypnea, leukocytosis). Order placed for admission but patient insisted upon leaving. Discussed potential complications and worsening of condition including death but patient wanted to go home. Signed out AMA.  

## 2017-05-25 NOTE — ED Triage Notes (Signed)
Pt in via [facility] from home.  Pt with syncopal episode; found face down in the floor, pt combative when he came to per EMS.  Pt given 5mg  Haldol, 2mg  Versed prior to arrival.  Pt calm, cooperative at this time.  Vitals WDL.

## 2017-05-25 NOTE — Progress Notes (Signed)
Pharmacy Antibiotic Note  Shane Reid. is a 71 y.o. male admitted on 05/25/2017 with CAP.  Pharmacy has been consulted for azithromycin and ceftriaxone dosing.  Plan: Azithromycin 500 mg q 24 hours ordered. Ceftriaxone 1 gram q 24 hours ordered.  Height: 6' (182.9 cm) Weight: 160 lb (72.6 kg) IBW/kg (Calculated) : 77.6  Temp (24hrs), Avg:100.4 F (38 C), Min:100.4 F (38 C), Max:100.4 F (38 C)  Recent Labs  Lab 05/25/17 0015  WBC 12.4*  CREATININE 1.17    Estimated Creatinine Clearance: 58.6 mL/min (by C-G formula based on SCr of 1.17 mg/dL).    Allergies  Allergen Reactions  . Metoprolol Succinate Other (See Comments)    Reaction:  Unknown   . Niacin Other (See Comments)    Reaction:  Unknown   . Sertraline Hcl Other (See Comments)    Reaction:  Unknown     Antimicrobials this admission: Azithromycin, ceftriaxone 12/28  >>    >>   Dose adjustments this admission:   Microbiology results: 12/28 BCx: pending      12/28 UA: pending 12/28 CXR: No definite focal consolidation.  Thank you for allowing pharmacy to be a part of this patient's care.  Maliea Grandmaison S 05/25/2017 2:32 AM

## 2017-06-06 ENCOUNTER — Other Ambulatory Visit: Payer: Self-pay | Admitting: Otolaryngology

## 2017-06-06 DIAGNOSIS — R1310 Dysphagia, unspecified: Secondary | ICD-10-CM

## 2017-06-11 ENCOUNTER — Emergency Department
Admission: EM | Admit: 2017-06-11 | Discharge: 2017-06-12 | Disposition: A | Discharge status: Home / Self Care | Payer: Medicare Other | Attending: Emergency Medicine | Admitting: Emergency Medicine

## 2017-06-11 ENCOUNTER — Other Ambulatory Visit: Payer: Self-pay

## 2017-06-11 ENCOUNTER — Emergency Department: Payer: Medicare Other

## 2017-06-11 DIAGNOSIS — Z79899 Other long term (current) drug therapy: Secondary | ICD-10-CM | POA: Insufficient documentation

## 2017-06-11 DIAGNOSIS — R079 Chest pain, unspecified: Secondary | ICD-10-CM | POA: Diagnosis not present

## 2017-06-11 DIAGNOSIS — I2510 Atherosclerotic heart disease of native coronary artery without angina pectoris: Secondary | ICD-10-CM | POA: Insufficient documentation

## 2017-06-11 DIAGNOSIS — Z7982 Long term (current) use of aspirin: Secondary | ICD-10-CM | POA: Diagnosis not present

## 2017-06-11 DIAGNOSIS — R0602 Shortness of breath: Secondary | ICD-10-CM | POA: Diagnosis not present

## 2017-06-11 DIAGNOSIS — I509 Heart failure, unspecified: Secondary | ICD-10-CM | POA: Insufficient documentation

## 2017-06-11 DIAGNOSIS — J449 Chronic obstructive pulmonary disease, unspecified: Secondary | ICD-10-CM | POA: Insufficient documentation

## 2017-06-11 DIAGNOSIS — Z87891 Personal history of nicotine dependence: Secondary | ICD-10-CM | POA: Diagnosis not present

## 2017-06-11 DIAGNOSIS — Z7901 Long term (current) use of anticoagulants: Secondary | ICD-10-CM | POA: Insufficient documentation

## 2017-06-11 DIAGNOSIS — J84112 Idiopathic pulmonary fibrosis: Secondary | ICD-10-CM | POA: Diagnosis not present

## 2017-06-11 DIAGNOSIS — I110 Hypertensive heart disease with heart failure: Secondary | ICD-10-CM | POA: Insufficient documentation

## 2017-06-11 LAB — CBC WITH DIFFERENTIAL/PLATELET
BASOS ABS: 0.1 10*3/uL (ref 0–0.1)
Basophils Relative: 1 %
Eosinophils Absolute: 0.2 10*3/uL (ref 0–0.7)
Eosinophils Relative: 1 %
HEMATOCRIT: 42.7 % (ref 40.0–52.0)
Hemoglobin: 14 g/dL (ref 13.0–18.0)
LYMPHS PCT: 22 %
Lymphs Abs: 2.9 10*3/uL (ref 1.0–3.6)
MCH: 28 pg (ref 26.0–34.0)
MCHC: 32.8 g/dL (ref 32.0–36.0)
MCV: 85.3 fL (ref 80.0–100.0)
Monocytes Absolute: 1.5 10*3/uL — ABNORMAL HIGH (ref 0.2–1.0)
Monocytes Relative: 11 %
NEUTROS ABS: 8.5 10*3/uL — AB (ref 1.4–6.5)
Neutrophils Relative %: 65 %
Platelets: 356 10*3/uL (ref 150–440)
RBC: 5 MIL/uL (ref 4.40–5.90)
RDW: 13.6 % (ref 11.5–14.5)
WBC: 13.2 10*3/uL — AB (ref 3.8–10.6)

## 2017-06-11 LAB — COMPREHENSIVE METABOLIC PANEL
ALBUMIN: 4 g/dL (ref 3.5–5.0)
ALT: 21 U/L (ref 17–63)
ANION GAP: 8 (ref 5–15)
AST: 33 U/L (ref 15–41)
Alkaline Phosphatase: 63 U/L (ref 38–126)
BILIRUBIN TOTAL: 0.7 mg/dL (ref 0.3–1.2)
BUN: 16 mg/dL (ref 6–20)
CHLORIDE: 104 mmol/L (ref 101–111)
CO2: 29 mmol/L (ref 22–32)
Calcium: 9.2 mg/dL (ref 8.9–10.3)
Creatinine, Ser: 1.11 mg/dL (ref 0.61–1.24)
GFR calc Af Amer: >60 mL/min (ref >60)
GLUCOSE: 84 mg/dL (ref 65–99)
POTASSIUM: 4 mmol/L (ref 3.5–5.1)
Sodium: 141 mmol/L (ref 135–145)
TOTAL PROTEIN: 7.8 g/dL (ref 6.5–8.1)

## 2017-06-11 LAB — BRAIN NATRIURETIC PEPTIDE: B Natriuretic Peptide: 34 pg/mL (ref 0.0–100.0)

## 2017-06-11 LAB — TROPONIN I

## 2017-06-11 MED ORDER — ACETAMINOPHEN 500 MG PO TABS
1000.0000 mg | ORAL_TABLET | Freq: Once | ORAL | Status: AC
  Administered 2017-06-11: 1000 mg via ORAL
  Filled 2017-06-11: qty 2

## 2017-06-11 MED ORDER — IPRATROPIUM-ALBUTEROL 0.5-2.5 (3) MG/3ML IN SOLN
3.0000 mL | Freq: Once | RESPIRATORY_TRACT | Status: AC
  Administered 2017-06-11: 3 mL via RESPIRATORY_TRACT
  Filled 2017-06-11: qty 3

## 2017-06-11 MED ORDER — IOPAMIDOL (ISOVUE-370) INJECTION 76%
75.0000 mL | Freq: Once | INTRAVENOUS | Status: AC | PRN
  Administered 2017-06-11: 75 mL via INTRAVENOUS

## 2017-06-11 MED ORDER — ALBUTEROL SULFATE (2.5 MG/3ML) 0.083% IN NEBU
2.5000 mg | INHALATION_SOLUTION | Freq: Once | RESPIRATORY_TRACT | Status: AC
  Administered 2017-06-11: 2.5 mg via RESPIRATORY_TRACT
  Filled 2017-06-11: qty 3

## 2017-06-11 NOTE — ED Triage Notes (Signed)
Patient c/o SOB and 'lung pain' all day today. Patient reports that he has pulmonary fibrosis and is normally SOB, however it has increased in severity today.

## 2017-06-11 NOTE — ED Notes (Signed)
No answer when called from lobby 

## 2017-06-11 NOTE — ED Provider Notes (Signed)
[facility] Emergency Department Provider Note  ____________________________________________  Time seen: Approximately 11:12 PM  I have reviewed the triage vital signs and the nursing notes.   HISTORY  Chief Complaint Shortness of Breath   HPI Shane Reid. is a 72 y.o. male the history of pulmonary fibrosis, CHF, COPD, CAD, hypertension who presents for evaluation of chest pain and shortness of breath. Patient reports that he has chronic sharp chest pain and shortness of breath for 3 years. Today his pain got slightly worse and so did his shortness of breath. He is currently endorsing pleuritic diffuse sharp chest pain that is constant for 3 years and non-radiating, worse today. Shortness of breath is slightly worse than his baseline. No changes in his chronic cough, no hemoptysis, no personal or family history of blood clots, no recent travel or immobilization, no leg pain or swelling, no exogenous hormones.No fever, no nausea, no vomiting, no diarrhea. Patient reports that he doesn't take anything at home for his pain.  Past Medical History:  Diagnosis Date  . Anxiety   . Asthma   . Bradycardia   . CHF (congestive heart failure) (HCC)   . Chronic Chest Pain   . COPD (chronic obstructive pulmonary disease) (HCC)   . Coronary artery disease    a. 2008 s/p PCI to LAD;  b. 2013 Cath: patent stent->Med Rx;  c. 11/2013 MV: EF 67%, no ischemia/infarct.  . Depression   . GERD (gastroesophageal reflux disease)   . Heart murmur    a. 02/2010 Echo: EF 55-60%, no rwma, Gr 2 DD, triv MR, mildly dil LA, nl RV.  [name] Hypercholesteremia   . Hypertension   . Hypertensive heart disease   . Idiopathic pulmonary fibrosis (HCC)    a. Seen by Dr. Dema Severin 01/2015 and Rx Esbriet - pt cannot afford.  . Other social stressor    a. wife with mental illness    Patient Active Problem List   Diagnosis Date Noted  . Hypertensive heart disease   . Chronic Chest Pain   .  Coronary artery disease   . Hypercholesteremia   . 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 IPF (idiopathic pulmonary fibrosis) (HCC) 12/23/2014  . Pre-operative cardiovascular examination 11/05/2014  . ILD (interstitial lung disease) (HCC) 09/04/2014  . Interstitial lung disease (HCC) 06/22/2014  . Upper respiratory infection 12/03/2013  . CAROTID BRUIT, LEFT 05/12/2010  . Hyperlipidemia 07/01/2009  . CAD, NATIVE VESSEL 07/01/2009  . SHORTNESS OF BREATH 07/01/2009  . Atypical chest pain 07/01/2009    Past Surgical History:  Procedure Laterality Date  . CARDIAC CATHETERIZATION    . CORONARY ANGIOPLASTY     stents times 4  . Esophagus stretch    . EYE SURGERY     Bilateral cataracts  . LUNG BIOPSY    . NASAL ENDOSCOPY N/A 12/21/2016   Procedure: NASAL ENDOSCOPY;  Surgeon: Vernie Murders, MD;  Location: [facility];  Service: ENT;  Laterality: N/A;  . TESTICLE SURGERY    . VIDEO ASSISTED THORACOSCOPY (VATS)/THOROCOTOMY Right 11/18/2014   Procedure: VIDEO ASSISTED THORACOSCOPY (VATS)/ wedge resection biopsy;  Surgeon: Hulda Marin, MD;  Location: [facility] OR[facility];  Service: General;  Laterality: Right;    Prior to Admission medications   Medication Sig Start Date End Date Taking? Authorizing Provider  albuterol (PROVENTIL) (2.5 MG/3ML) 0.083% nebulizer solution Take 3 mLs (2.5 mg total) by nebulization every 6 (six) hours as needed. 05/02/12   Julien Nordmann  J, MD  amLODipine-benazepril (LOTREL) 5-10 MG capsule Take 1 capsule by mouth daily. 03/01/17   Antonieta Iba, MD  aspirin EC 81 MG tablet Take 81 mg by mouth every morning. 08/06/12   [provider]  clopidogrel (PLAVIX) 75 MG tablet TAKE ONE (1) TABLET EACH DAY 03/01/17   Antonieta Iba, MD  guaiFENesin (ROBITUSSIN) 100 MG/5ML SOLN Take 5 mLs (100 mg total) by mouth every 4 (four) hours as needed for cough or to loosen phlegm. Patient not taking: Reported on 05/25/2017  11/15/16   Sharman Cheek, MD  Multiple Vitamins-Minerals (CENTRUM SILVER PO) Take by mouth daily.    [provider]  nitroGLYCERIN (NITROSTAT) 0.4 MG SL tablet Place 1 tablet (0.4 mg total) under the tongue every 5 (five) minutes as needed. 03/01/17   Antonieta Iba, MD  rosuvastatin (CRESTOR) 10 MG tablet Take 1 tablet (10 mg total) by mouth daily. 05/12/15   Antonieta Iba, MD  rosuvastatin (CRESTOR) 10 MG tablet TAKE ONE (1) TABLET EACH DAY Patient not taking: Reported on 05/25/2017 03/01/17   Antonieta Iba, MD    Allergies Metoprolol succinate; Niacin; and Sertraline hcl  Family History  Problem Relation Age of Onset  . Coronary artery disease Mother        s/p CABG  . Heart failure Mother   . Heart disease Other   . Depression Other     Social History Social History   Tobacco Use  . Smoking status: Former Smoker    Packs/day: 0.50    Years: 1.00    Pack years: 0.50    Types: Cigarettes    Last attempt to quit: 05/09/1968    Years since quitting: 49.1  . Smokeless tobacco: Never Used  Substance Use Topics  . Alcohol use: No  . Drug use: No    Review of Systems  Constitutional: Negative for fever. Eyes: Negative for visual changes. ENT: Negative for sore throat. Neck: No neck pain  Cardiovascular: +chest pain. Respiratory: + shortness of breath. Gastrointestinal: Negative for abdominal pain, vomiting or diarrhea. Genitourinary: Negative for dysuria. Musculoskeletal: Negative for back pain. Skin: Negative for rash. Neurological: Negative for headaches, weakness or numbness. Psych: No SI or HI  ____________________________________________   PHYSICAL EXAM:  VITAL SIGNS: ED Triage Vitals  Enc Vitals Group     BP 06/11/17 2002 (!) 151/87     Pulse Rate 06/11/17 2002 70     Resp 06/11/17 2002 (!) 21     Temp 06/11/17 2002 98.5 F (36.9 C)     Temp Source 06/11/17 2002 Oral     SpO2 06/11/17 2002 98 %     Weight 06/11/17 2002 160 lb  (72.6 kg)     Height --      Head Circumference --      Peak Flow --      Pain Score 06/11/17 2001 6     Pain Loc --      Pain Edu? --      Excl. in GC? --     Constitutional: Alert and oriented. Well appearing and in no apparent distress. HEENT:      Head: Normocephalic and atraumatic.         Eyes: Conjunctivae are normal. Sclera is non-icteric.       Mouth/Throat: Mucous membranes are moist.       Neck: Supple with no signs of meningismus. Cardiovascular: Regular rate and rhythm. No murmurs, gallops, or rubs. 2+ symmetrical distal pulses are  present in all extremities. No JVD. Respiratory: Normal respiratory effort. Lungs are clear to auscultation bilaterally. No wheezes, crackles, or rhonchi.  Gastrointestinal: Soft, non tender, and non distended with positive bowel sounds. No rebound or guarding. Musculoskeletal: Nontender with normal range of motion in all extremities. No edema, cyanosis, or erythema of extremities. Neurologic: Normal speech and language. Face is symmetric. Moving all extremities. No gross focal neurologic deficits are appreciated. Skin: Skin is warm, dry and intact. No rash noted. Psychiatric: Mood and affect are normal. Speech and behavior are normal.  ____________________________________________   LABS (all labs ordered are listed, but only abnormal results are displayed)  Labs Reviewed  CBC WITH DIFFERENTIAL/PLATELET - Abnormal; Notable for the following components:      Result Value   WBC 13.2 (*)    Neutro Abs 8.5 (*)    Monocytes Absolute 1.5 (*)    All other components within normal limits  TROPONIN I  COMPREHENSIVE METABOLIC PANEL  BRAIN NATRIURETIC PEPTIDE  TROPONIN I   ____________________________________________  EKG  ED ECG REPORT I, Nita Sickle, the attending physician, personally viewed and interpreted this ECG.  Normal sinus rhythm, rate of 73, normal intervals, normal axis, no ST elevations or depressions. Normal  EKG. ____________________________________________  RADIOLOGY  CXR: 1. Spectrum of findings compatible with chronic interstitial lung disease as detailed, asymmetrically involving the right lung, probably progressed since 2015, although not appreciably progressed since 01/26/2017 radiographs. 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 2. No acute superimposed consolidative airspace disease.  CTA chest:  PND ____________________________________________   PROCEDURES  Procedure(s) performed: None Procedures Critical Care performed:  None ____________________________________________   INITIAL IMPRESSION / ASSESSMENT AND PLAN / ED COURSE  72 y.o. male the history of pulmonary fibrosis, CHF, COPD, CAD, hypertension who presents for evaluation of chest pain and shortness of breath x 3 years but worse today. Pain is pleuritic. Patient with normal work of breathing, clear lungs, normal sats, EKG with no ischemic changes, first troponin is negative. CTA is pending to rule out PE vs PNA. Patient given duoneb, tylenol for pain. Plan to f/u CTA and 2nd troponin and reassess. Care transferred to Dr. Manson Passey.      As part of my medical decision making, I reviewed the following data within the electronic MEDICAL RECORD NUMBER Nursing notes reviewed and incorporated, Labs reviewed , EKG interpreted , Old EKG reviewed, Old chart reviewed, Patient signed out to Dr. Manson Passey, Radiograph reviewed , Notes from prior ED visits and [place] Controlled Substance Database    Pertinent labs & imaging results that were available during my care of the patient were reviewed by me and considered in my medical decision making (see chart for details).    ____________________________________________   FINAL CLINICAL IMPRESSION(S) / ED DIAGNOSES  Final diagnoses:  Shortness of breath  Chest pain, unspecified type      NEW MEDICATIONS STARTED DURING THIS VISIT:  ED Discharge Orders    None        Note:  This document was prepared using Dragon voice recognition software and may include unintentional dictation errors.    Nita Sickle, MD 06/11/17 [phone]

## 2017-06-11 NOTE — ED Notes (Signed)
Pt noted returning to ED lobby from outside drinking soda, no difficulty, no distress noted

## 2017-06-11 NOTE — ED Notes (Signed)
Pt c/o right-sided lung pain that worsened this morning. Hx pulmonary fibrosis. Pt currently 99% on room air. Unlabored breathing. SOB while talking.

## 2017-06-12 LAB — TROPONIN I: Troponin I: <0.03 ng/mL (ref <0.03)

## 2017-06-12 NOTE — ED Provider Notes (Signed)
I assumed care of the patient from Dr. Don Perking repeat troponin negative at less than 0.03.  CT angiogram revealed no acute pathology:  CLINICAL DATA:  72 year old male with dyspnea.  EXAM: CT ANGIOGRAPHY CHEST WITH CONTRAST  TECHNIQUE: Multidetector CT imaging of the chest was performed using the standard protocol during bolus administration of intravenous contrast. Multiplanar CT image reconstructions and MIPs were obtained to evaluate the vascular anatomy.  CONTRAST:  48mL ISOVUE-370 IOPAMIDOL (ISOVUE-370) INJECTION 76%  COMPARISON:  Chest radiograph dated 06/11/2017 and CT dated 10/29/2015  FINDINGS: Cardiovascular: Top-normal cardiac size. Coronary vascular stent in the LAD. There is no pericardial effusion. Mild atherosclerotic calcification of the aorta. No aneurysmal dilatation or evidence of dissection. There is mild prominence of the central pulmonary arteries suggestive of underlying pulmonary hypertension. No CT evidence of pulmonary embolism.  Mediastinum/Nodes: Top-normal bilateral hilar lymph nodes measure up to 1 cm in short axis on the right. Subcarinal lymph node measures approximately 18 mm. Anterior mediastinal/prevascular lymph node measures 10 mm in short axis. These findings are similar to prior CT. There is no mediastinal fluid collection. The esophagus and the thyroid gland are grossly unremarkable.  Lungs/Pleura: Subpleural reticular densities and honeycombing asymmetrically involving the right lung consistent with interstitial lung disease and fibrosis. No significant interval progression compared to the prior CT. There is mild lower lobe predominant bronchiectatic changes. There is no focal consolidation, pleural effusion, or pneumothorax. The central airways are patent. An accessory bronchus noted in the right upper lobe. A 1.1 x 1.6 cm air containing structure to the right of the trachea at the level of T2 appears similar to prior CT and  may represent a bronchogenic cyst.  Upper Abdomen: Multiple stones within the gallbladder. No pericholecystic fluid. Small scattered hepatic hypodense lesions are not characterized on this CT but appears similar to prior CT.  Musculoskeletal: Mild degenerative changes of the spine.  Review of the MIP images confirms the above findings.  IMPRESSION: 1. No acute intrathoracic pathology. No CT evidence of pulmonary embolism. 2. Interstitial lung disease with no significant interval change compared to the prior CT. 3. Mild prominence of the central pulmonary arteries suggestive of pulmonary hypertension. 4. Cholelithiasis.   Patient informed of all clinical findings including the finding of cholelithiasis on CT scan.  Patient has any chest pain at this time and no dyspnea.  Patient's oxygen saturation 100% on room air.   Darci Current, MD 06/12/17 [phone]

## 2017-06-13 ENCOUNTER — Ambulatory Visit
Admission: RE | Admit: 2017-06-13 | Discharge: 2017-06-13 | Disposition: A | Discharge status: Home / Self Care | Payer: Medicare Other | Source: Ambulatory Visit | Attending: Otolaryngology | Admitting: Otolaryngology

## 2017-06-13 DIAGNOSIS — R1310 Dysphagia, unspecified: Secondary | ICD-10-CM | POA: Insufficient documentation

## 2017-06-13 DIAGNOSIS — K449 Diaphragmatic hernia without obstruction or gangrene: Secondary | ICD-10-CM | POA: Diagnosis not present

## 2017-06-20 ENCOUNTER — Telehealth: Payer: Self-pay | Admitting: Cardiovascular Disease

## 2017-06-20 NOTE — Telephone Encounter (Signed)
Spoke with patients wife per release form and she states he is at doctor appointment and she will have him call us when he comes back in. She provided his new cell number [phone] and updated demographics. Advised her to contact us and she verbalized understanding with no further questions.

## 2017-06-20 NOTE — Telephone Encounter (Signed)
Patient requesting clearance for nose surgery - would like to know if he will need appt He also will need to know when to stop plavix Please call to discuss

## 2017-06-21 ENCOUNTER — Ambulatory Visit (INDEPENDENT_AMBULATORY_CARE_PROVIDER_SITE_OTHER): Payer: Medicare Other | Admitting: Surgery

## 2017-06-21 ENCOUNTER — Encounter: Payer: Self-pay | Admitting: Surgery

## 2017-06-21 VITALS — BP 122/73 | HR 74 | Temp 98.1°F | Ht >= 80 in | Wt 156.2 lb

## 2017-06-21 DIAGNOSIS — K8020 Calculus of gallbladder without cholecystitis without obstruction: Secondary | ICD-10-CM | POA: Diagnosis not present

## 2017-06-21 NOTE — Telephone Encounter (Signed)
Spoke with Toniann Fail at [facility] and she states that patients procedure has not been scheduled at this time. They are thinking it may be sometime in May. Advised her to just send [place] cardiac clearance request when his procedure is scheduled. She verbalized understanding with no further questions at this time.

## 2017-06-21 NOTE — Telephone Encounter (Signed)
Spoke with patient and reviewed that Dr. Mariah Milling recommended he hold plavix 5 days before his procedure. Advised that they typically send [place] request for cardiac clearance and instructions and when we get that we will update them as well. He reports that Toniann Fail is the lady helping with getting it set up and her number is [phone]. Advised I would give her a call as well. He verbalized understanding of our conversation with no further questions or concerns at this time.

## 2017-06-21 NOTE — Telephone Encounter (Signed)
Patient calling to state that the number he had given is incorrect Please call [phone]

## 2017-06-21 NOTE — Telephone Encounter (Signed)
Left voicemail message for Toniann Fail to call back regarding Mr. Bicking and his upcoming procedure.

## 2017-06-21 NOTE — Patient Instructions (Addendum)
Cholelithiasis Cholelithiasis is also called "gallstones." It is a kind of gallbladder disease. The gallbladder is an organ that stores a liquid (bile) that helps you digest fat. Gallstones may not cause symptoms (may be silent gallstones) until they cause a blockage, and then they can cause pain (gallbladder attack). Follow these instructions at home:  Take over-the-counter and prescription medicines only as told by your doctor.  Stay at a healthy weight.  Eat healthy foods. This includes: ? Eating fewer fatty foods, like fried foods. ? Eating fewer refined carbs (refined carbohydrates). Refined carbs are breads and grains that are highly processed, like white bread and white rice. Instead, choose whole grains like whole-wheat bread and brown rice. ? Eating more fiber. Almonds, fresh fruit, and beans are healthy sources of fiber.  Keep all follow-up visits as told by your doctor. This is important. Contact a doctor if:  You have sudden pain in the upper right side of your belly (abdomen). Pain might spread to your right shoulder or your chest. This may be a sign of a gallbladder attack.  You feel sick to your stomach (are nauseous).  You throw up (vomit).  You have been diagnosed with gallstones that have no symptoms and you get: ? Belly pain. ? Discomfort, burning, or fullness in the upper part of your belly (indigestion). Get help right away if:  You have sudden pain in the upper right side of your belly, and it lasts for more than 2 hours.  You have belly pain that lasts for more than 5 hours.  You have a fever or chills.  You keep feeling sick to your stomach or you keep throwing up.  Your skin or the whites of your eyes turn yellow (jaundice).  You have dark-colored pee (urine).  You have light-colored poop (stool). Summary  Cholelithiasis is also called "gallstones."  The gallbladder is an organ that stores a liquid (bile) that helps you digest fat.  Silent  gallstones are gallstones that do not cause symptoms.  A gallbladder attack may cause sudden pain in the upper right side of your belly. Pain might spread to your right shoulder or your chest. If this happens, contact your doctor.  If you have sudden pain in the upper right side of your belly that lasts for more than 2 hours, get help right away. This information is not intended to replace advice given to you by your health care provider. Make sure you discuss any questions you have with your health care provider. Document Released: 11/01/2007 Document Revised: 01/30/2016 Document Reviewed: 01/30/2016 [employer] Interactive Patient Education  2017 [employer]  

## 2017-06-21 NOTE — Progress Notes (Signed)
Surgical Clinic History and Physical  Referring provider:  Mickel Fuchs, MD [address] [place], [place] [address]  HISTORY OF PRESENT ILLNESS (HPI):  72 y.o. male with chronic pulmonary hypertension attributed to chronic pulmonary fibrosis presents for evaluation of gallstones. Patient reports he presented to [facility] ED 1 - 2 weeks ago for difficulty catching his breath, and his evaluation included chest CT, on which gallstones were incidentally visualized and for which surgical follow-up was advised. Patient denies any abdominal pain, RUQ, post-prandial, or otherwise. He otherwise says his breathing has improved and he denies N/V or fever/chills.  PAST MEDICAL HISTORY (PMH):  Past Medical History:  Diagnosis Date  . Anxiety   . Asthma   . Bradycardia   . CHF (congestive heart failure) (HCC)   . Chronic Chest Pain   . COPD (chronic obstructive pulmonary disease) (HCC)   . Coronary artery disease    a. 2008 s/p PCI to LAD;  b. 2013 Cath: patent stent->Med Rx;  c. 11/2013 MV: EF 67%, no ischemia/infarct.  . Depression   . GERD (gastroesophageal reflux disease)   . Heart murmur    a. 02/2010 Echo: EF 55-60%, no rwma, Gr 2 DD, triv MR, mildly dil LA, nl RV.  [name] Hypercholesteremia   . Hypertension   . Hypertensive heart disease   . Idiopathic pulmonary fibrosis (HCC)    a. Seen by Dr. Dema Severin 01/2015 and Rx Esbriet - pt cannot afford.  . Other social stressor    a. wife with mental illness     PAST SURGICAL HISTORY (PSH):  Past Surgical History:  Procedure Laterality Date  . CARDIAC CATHETERIZATION    . CORONARY ANGIOPLASTY     stents times 4  . Esophagus stretch    . EYE SURGERY     Bilateral cataracts  . LUNG BIOPSY    . NASAL ENDOSCOPY N/A 12/21/2016   Procedure: NASAL ENDOSCOPY;  Surgeon: Vernie Murders, MD;  Location: [facility];  Service: ENT;  Laterality: N/A;  . TESTICLE SURGERY    . VIDEO ASSISTED THORACOSCOPY (VATS)/THOROCOTOMY Right 11/18/2014   Procedure: VIDEO ASSISTED THORACOSCOPY (VATS)/ wedge resection biopsy;  Surgeon: Hulda Marin, MD;  Location: [facility];  Service: General;  Laterality: Right;     MEDICATIONS:  Prior to Admission medications   Medication Sig Start Date End Date Taking? Authorizing Provider  albuterol (PROVENTIL) (2.5 MG/3ML) 0.083% nebulizer solution Take 3 mLs (2.5 mg total) by nebulization every 6 (six) hours as needed. 05/02/12  Yes Gollan, Tollie Pizza, MD  amLODipine-benazepril (LOTREL) 5-10 MG capsule Take 1 capsule by mouth daily. 03/01/17  Yes Antonieta Iba, MD  aspirin EC 81 MG tablet Take 81 mg by mouth every morning. 08/06/12  Yes [provider]  clopidogrel (PLAVIX) 75 MG tablet TAKE ONE (1) TABLET EACH DAY 03/01/17  Yes Gollan, Tollie Pizza, MD  guaiFENesin (ROBITUSSIN) 100 MG/5ML SOLN Take 5 mLs (100 mg total) by mouth every 4 (four) hours as needed for cough or to loosen phlegm. 11/15/16  Yes Sharman Cheek, MD  Multiple Vitamins-Minerals (CENTRUM SILVER PO) Take by mouth daily.   Yes [provider]  nitroGLYCERIN (NITROSTAT) 0.4 MG SL tablet Place 1 tablet (0.4 mg total) under the tongue every 5 (five) minutes as needed. 03/01/17  Yes Gollan, Tollie Pizza, MD  rosuvastatin (CRESTOR) 10 MG tablet Take 1 tablet (10 mg total) by mouth daily. 05/12/15  Yes Antonieta Iba, MD  rosuvastatin (CRESTOR) 10 MG tablet TAKE ONE (1) TABLET  EACH DAY 03/01/17  Yes Gollan, Tollie Pizza, MD     ALLERGIES:  Allergies  Allergen Reactions  . Metoprolol Succinate Other (See Comments)    Reaction:  Unknown   . Niacin Other (See Comments)    Reaction:  Unknown   . Sertraline Hcl Other (See Comments)    Reaction:  Unknown      SOCIAL HISTORY:  Social History   Socioeconomic History  . Marital status: Married    Spouse name: Not on file  . Number of children: Not on file  . Years of education: Not on file  . Highest education level: Not on file  Social Needs  . Financial resource strain: Not  on file  . Food insecurity - worry: Not on file  . Food insecurity - inability: Not on file  . Transportation needs - medical: Not on file  . Transportation needs - non-medical: Not on file  Occupational History  . Not on file  Tobacco Use  . Smoking status: Former Smoker    Packs/day: 0.50    Years: 1.00    Pack years: 0.50    Types: Cigarettes    Last attempt to quit: 05/09/1968    Years since quitting: 49.1  . Smokeless tobacco: Never Used  Substance and Sexual Activity  . Alcohol use: No  . Drug use: No  . Sexual activity: Not on file  Other Topics Concern  . Not on file  Social History Narrative   Divorced but lives with ex-wife   On SSI disability, wife with mental illness       The patient currently resides (home / rehab facility / nursing home): Home The patient normally is (ambulatory / bedbound): Ambulatory  FAMILY HISTORY:  Family History  Problem Relation Age of Onset  . Coronary artery disease Mother        s/p CABG  . Heart failure Mother   . Heart disease Other   . Depression Other     Otherwise negative/non-contributory.  REVIEW OF SYSTEMS:  Constitutional: denies any other weight loss, fever, chills, or sweats  Eyes: denies any other vision changes, history of eye injury  ENT: denies sore throat, hearing problems  Respiratory: shortness of breath as per HPI, denies wheezing  Cardiovascular: denies chest pain or palpitations  Gastrointestinal: denies abdominal pain, N/V, constipation, or diarrhea Musculoskeletal: denies any other joint pains or cramps  Skin: Denies any other rashes or skin discolorations Neurological: denies any other headache, dizziness, weakness  Psychiatric: Denies any other depression, anxiety   All other review of systems were otherwise negative   VITAL SIGNS:  @VSRANGES @     Height: 7\' 5"  (226.1 cm) Weight: 156 lb 3.2 oz (70.9 kg) BMI (Calculated): 13.86   PHYSICAL EXAM:  Constitutional:  -- Normal body habitus  --  Awake, alert, and oriented x3  Eyes:  -- Pupils equally round and reactive to light  -- No scleral icterus  Ear, nose, throat:  -- No jugular venous distension -- No nasal drainage, bleeding Pulmonary:  -- No crackles  -- Equal breath sounds bilaterally -- Breathing non-labored at rest Cardiovascular:  -- S1, S2 present  -- No pericardial rubs  Gastrointestinal:  -- Abdomen soft, nontender, non-distended, no guarding/rebound  -- No abdominal masses appreciated, pulsatile or otherwise  Musculoskeletal and Integumentary:  -- Wounds or skin discoloration: None appreciated -- Extremities: B/L UE and LE FROM, hands and feet warm  Neurologic:  -- Motor function: Intact and symmetric -- Sensation: Intact and  symmetric  Labs:  CBC    Component Value Date/Time   WBC 13.2 (H) 06/11/2017 2017   RBC 5.00 06/11/2017 2017   HGB 14.0 06/11/2017 2017   HGB 13.8 03/24/2014 0000   HCT 42.7 06/11/2017 2017   HCT 42.6 03/24/2014 0000   PLT 356 06/11/2017 2017   PLT 212 03/24/2014 0000   MCV 85.3 06/11/2017 2017   MCV 88 03/24/2014 0000   MCH 28.0 06/11/2017 2017   MCHC 32.8 06/11/2017 2017   RDW 13.6 06/11/2017 2017   RDW 13.0 03/24/2014 0000   LYMPHSABS 2.9 06/11/2017 2017   LYMPHSABS 1.9 03/24/2014 0000   MONOABS 1.5 (H) 06/11/2017 2017   MONOABS 1.3 (H) 03/24/2014 0000   EOSABS 0.2 06/11/2017 2017   EOSABS 0.2 03/24/2014 0000   BASOSABS 0.1 06/11/2017 2017   BASOSABS 0.0 03/24/2014 0000   CMP Latest Ref Rng & Units 06/11/2017 05/25/2017 02/04/2017  Glucose 65 - 99 mg/dL 84 656(C) 127(N)  BUN 6 - 20 mg/dL 16 17 11   Creatinine 0.61 - 1.24 mg/dL 1.70 0.17 4.94  Sodium 135 - 145 mmol/L 141 135 139  Potassium 3.5 - 5.1 mmol/L 4.0 3.6 3.2(L)  Chloride 101 - 111 mmol/L 104 108 108  CO2 22 - 32 mmol/L 29 22 23   Calcium 8.9 - 10.3 mg/dL 9.2 8.0(L) 8.7(L)  Total Protein 6.5 - 8.1 g/dL 7.8 - 7.0  Total Bilirubin 0.3 - 1.2 mg/dL 0.7 - 0.3  Alkaline Phos 38 - 126 U/L 63 - 54  AST 15 -  41 U/L 33 - 37  ALT 17 - 63 U/L 21 - 34    Imaging studies:  CTA Chest (06/11/2017) - personally reviewed and discussed with patient 1. No acute intrathoracic pathology. No CT evidence of pulmonary embolism. 2. Interstitial lung disease with no significant interval change compared to the prior CT. 3. Mild prominence of the central pulmonary arteries suggestive of pulmonary hypertension. 4. Cholelithiasis.  Assessment/Plan:  72 y.o. 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   - no indication for surgical intervention at this time  - minimize fatty foods if post-prandial RUQ abdominal pain   - medical management of comorbidities as per primary medical physician   - return to clinic as needed, instructed to call if any questions or concerns  All of the above recommendations were discussed with the patient, and all of patient's questions were answered to his expressed satisfaction.  Thank you for the opportunity to participate in this patient's care.  -- Scherrie Gerlach Earlene Plater, MD, RPVI [place]: [facility] - Partnering for exceptional care. Office: [phone]

## 2017-06-27 DIAGNOSIS — Z9889 Other specified postprocedural states: Secondary | ICD-10-CM | POA: Insufficient documentation

## 2017-06-27 DIAGNOSIS — J310 Chronic rhinitis: Secondary | ICD-10-CM | POA: Insufficient documentation

## 2017-06-29 ENCOUNTER — Other Ambulatory Visit: Payer: Self-pay

## 2017-06-29 MED ORDER — ROSUVASTATIN CALCIUM 10 MG PO TABS: 10.0000 mg | ORAL_TABLET | Freq: Every day | ORAL | 3 refills | Status: DC

## 2017-06-29 MED ORDER — CLOPIDOGREL BISULFATE 75 MG PO TABS: ORAL_TABLET | ORAL | 3 refills | Status: DC

## 2017-06-29 MED ORDER — AMLODIPINE BESY-BENAZEPRIL HCL 5-10 MG PO CAPS: 1.0000 | ORAL_CAPSULE | Freq: Every day | ORAL | 3 refills | Status: DC

## 2017-07-30 ENCOUNTER — Telehealth: Payer: Self-pay | Admitting: Cardiovascular Disease

## 2017-07-30 ENCOUNTER — Other Ambulatory Visit: Payer: Self-pay | Admitting: *Deleted

## 2017-07-30 MED ORDER — ROSUVASTATIN CALCIUM 10 MG PO TABS: 10.0000 mg | ORAL_TABLET | Freq: Every day | ORAL | 0 refills | Status: DC

## 2017-07-30 MED ORDER — CLOPIDOGREL BISULFATE 75 MG PO TABS: ORAL_TABLET | ORAL | 0 refills | Status: DC

## 2017-07-30 MED ORDER — NITROGLYCERIN 0.4 MG SL SUBL: 0.4000 mg | SUBLINGUAL_TABLET | SUBLINGUAL | 0 refills | Status: DC | PRN

## 2017-07-30 MED ORDER — AMLODIPINE BESY-BENAZEPRIL HCL 5-10 MG PO CAPS: 1.0000 | ORAL_CAPSULE | Freq: Every day | ORAL | 0 refills | Status: DC

## 2017-07-30 NOTE — Telephone Encounter (Signed)
Spoke with patients wife per release form and reviewed that refills have been sent in except for the albuterol. She reports that he does not need that medication because he already has some at home. Let her know that other medications have been sent in to pharmacy requested and advised her to call back if any further questions. She was appreciative for the call with no further concerns.

## 2017-07-30 NOTE — Telephone Encounter (Signed)
Requested Prescriptions   Pending Prescriptions Disp Refills  . amLODipine-benazepril (LOTREL) 5-10 MG capsule 30 capsule 0    Sig: Take 1 capsule by mouth daily.  . clopidogrel (PLAVIX) 75 MG tablet 30 tablet 0    Sig: TAKE ONE (1) TABLET EACH DAY  . nitroGLYCERIN (NITROSTAT) 0.4 MG SL tablet 25 tablet 0    Sig: Place 1 tablet (0.4 mg total) under the tongue every 5 (five) minutes as needed.  . rosuvastatin (CRESTOR) 10 MG tablet 90 tablet 0    Sig: Take 1 tablet (10 mg total) by mouth daily.

## 2017-07-30 NOTE — Telephone Encounter (Signed)
Requested Prescriptions   Pending Prescriptions Disp Refills  . amLODipine-benazepril (LOTREL) 5-10 MG capsule 30 capsule 0    Sig: Take 1 capsule by mouth daily.  . clopidogrel (PLAVIX) 75 MG tablet 30 tablet 0    Sig: TAKE ONE (1) TABLET EACH DAY  . nitroGLYCERIN (NITROSTAT) 0.4 MG SL tablet 25 tablet 0    Sig: Place 1 tablet (0.4 mg total) under the tongue every 5 (five) minutes as needed.  . rosuvastatin (CRESTOR) 10 MG tablet 90 tablet 0    Sig: Take 1 tablet (10 mg total) by mouth daily.   Pt requesting Rx Albuterol. Please advise if ok to refill?

## 2017-07-30 NOTE — Telephone Encounter (Signed)
°*  STAT* If patient is at the pharmacy, call can be transferred to refill team.   1. Which medications need to be refilled? (please list name of each medication and dose if known)  albuterol (PROVENTIL) 2.5 MG amLODipine 5-10 MG 1 capsule daily  Clopidogrel (PLAVIX) 75 MG 1 tablet daily Nitroglycerin 0.4 MG 1 tablet as needed Rosuvastatin (CRESTOR) 10 MG 1 tablet daily    2. Which pharmacy/location (including street and city if local pharmacy) is medication to be sent to? [facility] [address] in [place]  3. Do they need a 30 day or 90 day supply? All 30 day except for rosuvastatin 90 day

## 2017-08-16 ENCOUNTER — Other Ambulatory Visit: Payer: Self-pay | Admitting: *Deleted

## 2017-08-16 MED ORDER — CLOPIDOGREL BISULFATE 75 MG PO TABS: ORAL_TABLET | ORAL | 0 refills | Status: DC

## 2017-09-14 ENCOUNTER — Other Ambulatory Visit: Payer: Self-pay

## 2017-09-14 ENCOUNTER — Emergency Department
Admission: EM | Admit: 2017-09-14 | Discharge: 2017-09-14 | Disposition: A | Discharge status: Home / Self Care | Payer: Medicare Other | Attending: Emergency Medicine | Admitting: Emergency Medicine

## 2017-09-14 ENCOUNTER — Encounter: Payer: Self-pay | Admitting: Emergency Medicine

## 2017-09-14 ENCOUNTER — Emergency Department: Payer: Medicare Other

## 2017-09-14 DIAGNOSIS — J449 Chronic obstructive pulmonary disease, unspecified: Secondary | ICD-10-CM | POA: Diagnosis not present

## 2017-09-14 DIAGNOSIS — Z7982 Long term (current) use of aspirin: Secondary | ICD-10-CM | POA: Insufficient documentation

## 2017-09-14 DIAGNOSIS — Z79899 Other long term (current) drug therapy: Secondary | ICD-10-CM | POA: Insufficient documentation

## 2017-09-14 DIAGNOSIS — I501 Left ventricular failure: Secondary | ICD-10-CM | POA: Diagnosis not present

## 2017-09-14 DIAGNOSIS — I2510 Atherosclerotic heart disease of native coronary artery without angina pectoris: Secondary | ICD-10-CM | POA: Insufficient documentation

## 2017-09-14 DIAGNOSIS — Z87891 Personal history of nicotine dependence: Secondary | ICD-10-CM | POA: Insufficient documentation

## 2017-09-14 DIAGNOSIS — Z955 Presence of coronary angioplasty implant and graft: Secondary | ICD-10-CM | POA: Diagnosis not present

## 2017-09-14 DIAGNOSIS — I110 Hypertensive heart disease with heart failure: Secondary | ICD-10-CM | POA: Diagnosis not present

## 2017-09-14 DIAGNOSIS — M25552 Pain in left hip: Secondary | ICD-10-CM

## 2017-09-14 DIAGNOSIS — M7918 Myalgia, other site: Secondary | ICD-10-CM

## 2017-09-14 DIAGNOSIS — Y999 Unspecified external cause status: Secondary | ICD-10-CM | POA: Insufficient documentation

## 2017-09-14 DIAGNOSIS — S7002XA Contusion of left hip, initial encounter: Secondary | ICD-10-CM | POA: Diagnosis not present

## 2017-09-14 DIAGNOSIS — S79912A Unspecified injury of left hip, initial encounter: Secondary | ICD-10-CM | POA: Diagnosis present

## 2017-09-14 DIAGNOSIS — Y939 Activity, unspecified: Secondary | ICD-10-CM | POA: Diagnosis not present

## 2017-09-14 DIAGNOSIS — W1830XA Fall on same level, unspecified, initial encounter: Secondary | ICD-10-CM | POA: Diagnosis not present

## 2017-09-14 DIAGNOSIS — Y929 Unspecified place or not applicable: Secondary | ICD-10-CM | POA: Diagnosis not present

## 2017-09-14 MED ORDER — TRAMADOL HCL 50 MG PO TABS: ORAL_TABLET | ORAL | 0 refills | Status: DC

## 2017-09-14 NOTE — ED Triage Notes (Addendum)
Patient ambulatory to triage with steady gait, without difficulty or distress noted; pt reports yesterday lost balance and fell injuring left hip; denies any other c/o or injuries

## 2017-09-14 NOTE — ED Provider Notes (Signed)
[facility] Emergency Department Provider Note  ____________________________________________   First MD Initiated Contact with Patient 09/14/17 [phone]     (approximate)  I have reviewed the triage vital signs and the nursing notes.   HISTORY  Chief Complaint Fall    HPI Shane Reid. is a 72 y.o. male with medical history as listed below who presents for evaluation of pain in his left hip that radiates all the way down his left leg.  He had a fall yesterday when he just lost balance and got tripped up and landed on his left buttock.  Says the pain starts in the left buttock but radiates down his leg.  It is worse when he is ambulating when he is walking on it although he is able to walk with a steady gait.  He says she get it checked out given that it is throbbing pain that is severe and making it hard for him to sleep.  He did not strike his head and he has no back pain.  He has no injuries to any other extremities.  Past Medical History:  Diagnosis Date  . Anxiety   . Asthma   . Bradycardia   . CHF (congestive heart failure) (HCC)   . Chronic Chest Pain   . COPD (chronic obstructive pulmonary disease) (HCC)   . Coronary artery disease    a. 2008 s/p PCI to LAD;  b. 2013 Cath: patent stent->Med Rx;  c. 11/2013 MV: EF 67%, no ischemia/infarct.  . Depression   . GERD (gastroesophageal reflux disease)   . Heart murmur    a. 02/2010 Echo: EF 55-60%, no rwma, Gr 2 DD, triv MR, mildly dil LA, nl RV.  [name] Hypercholesteremia   . Hypertension   . Hypertensive heart disease   . Idiopathic pulmonary fibrosis (HCC)    a. Seen by Dr. Dema Severin 01/2015 and Rx Esbriet - pt cannot afford.  . Other social stressor    a. wife with mental illness    Patient Active Problem List   Diagnosis Date Noted  . 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 Benign localized hyperplasia of prostate with urinary obstruction 03/01/2017  . Incomplete emptying of bladder 03/01/2017  . Scrotal  pain 09/11/2016  . Oropharyngeal dysphagia 09/21/2015  . Hypertensive heart disease   . Chronic Chest Pain   . Ischemic heart disease   . Hypercholesteremia   . 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 IPF (idiopathic pulmonary fibrosis) (HCC) 12/23/2014  . Pre-operative cardiovascular examination 11/05/2014  . ILD (interstitial lung disease) (HCC) 09/04/2014  . Interstitial lung disease (HCC) 06/22/2014  . Upper respiratory infection 12/03/2013  . 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 CAROTID BRUIT, LEFT 05/12/2010  . Hyperlipidemia 07/01/2009  . CAD, NATIVE VESSEL 07/01/2009  . SHORTNESS OF BREATH 07/01/2009  . Atypical chest pain 07/01/2009  . Family history of early CAD 01/23/2007  . Anxiety and depression 02/02/2005    Past Surgical History:  Procedure Laterality Date  . CARDIAC CATHETERIZATION    . CORONARY ANGIOPLASTY     stents times 4  . Esophagus stretch    . EYE SURGERY     Bilateral cataracts  . LUNG BIOPSY    . NASAL ENDOSCOPY N/A 12/21/2016   Procedure: NASAL ENDOSCOPY;  Surgeon: Vernie Murders, MD;  Location: [facility];  Service: ENT;  Laterality: N/A;  . TESTICLE SURGERY    . VIDEO ASSISTED THORACOSCOPY (VATS)/THOROCOTOMY Right 11/18/2014   Procedure: VIDEO ASSISTED  THORACOSCOPY (VATS)/ wedge resection biopsy;  Surgeon: Hulda Marin, MD;  Location: [facility];  Service: General;  Laterality: Right;    Prior to Admission medications   Medication Sig Start Date End Date Taking? Authorizing Provider  albuterol (PROVENTIL) (2.5 MG/3ML) 0.083% nebulizer solution Take 3 mLs (2.5 mg total) by nebulization every 6 (six) hours as needed. 05/02/12   Antonieta Iba, MD  amLODipine-benazepril (LOTREL) 5-10 MG capsule Take 1 capsule by mouth daily. 07/30/17   Antonieta Iba, MD  aspirin EC 81 MG tablet Take 81 mg by mouth every morning. 08/06/12   [provider]  clopidogrel (PLAVIX) 75 MG tablet TAKE ONE (1) TABLET  EACH DAY 08/16/17   Antonieta Iba, MD  guaiFENesin (ROBITUSSIN) 100 MG/5ML SOLN Take 5 mLs (100 mg total) by mouth every 4 (four) hours as needed for cough or to loosen phlegm. 11/15/16   Sharman Cheek, MD  Multiple Vitamins-Minerals (CENTRUM SILVER PO) Take by mouth daily.    [provider]  nitroGLYCERIN (NITROSTAT) 0.4 MG SL tablet Place 1 tablet (0.4 mg total) under the tongue every 5 (five) minutes as needed. 07/30/17   Antonieta Iba, MD  rosuvastatin (CRESTOR) 10 MG tablet Take 1 tablet (10 mg total) by mouth daily. 07/30/17   Antonieta Iba, MD    Allergies Metoprolol succinate; Niacin; and Sertraline hcl  Family History  Problem Relation Age of Onset  . Coronary artery disease Mother        s/p CABG  . Heart failure Mother   . Heart disease Other   . Depression Other     Social History Social History   Tobacco Use  . Smoking status: Former Smoker    Packs/day: 0.50    Years: 1.00    Pack years: 0.50    Types: Cigarettes    Last attempt to quit: 05/09/1968    Years since quitting: 49.3  . Smokeless tobacco: Never Used  Substance Use Topics  . Alcohol use: No  . Drug use: No    Review of Systems Constitutional: No fever/chills Eyes: No visual changes. Cardiovascular: Denies chest pain. Respiratory: Denies shortness of breath. Gastrointestinal: No abdominal pain.  No nausea, no vomiting.   Musculoskeletal: Left leg pain that starts in the buttock and travels down the left leg.  No back pain, no neck pain. Integumentary: No laceration. Neurological: Negative for headaches, focal weakness or numbness.   ____________________________________________   PHYSICAL EXAM:  VITAL SIGNS: ED Triage Vitals  Enc Vitals Group     BP 09/14/17 0144 138/80     Pulse Rate 09/14/17 0144 66     Resp 09/14/17 0144 18     Temp 09/14/17 0144 97.9 F (36.6 C)     Temp Source 09/14/17 0144 Oral     SpO2 09/14/17 0144 97 %     Weight 09/14/17 0144 70.8 kg  (156 lb)     Height 09/14/17 0144 1.753 m (5\' 9" )     Head Circumference --      Peak Flow --      Pain Score 09/14/17 0143 8     Pain Loc --      Pain Edu? --      Excl. in GC? --     Constitutional: Alert and oriented. Well appearing and in no acute distress. Eyes: Conjunctivae are normal.  Head: Atraumatic. Neck: No stridor.  No meningeal signs.  No cervical spine tenderness to palpation. Cardiovascular: Normal rate, regular rhythm. Good peripheral  circulation. Respiratory: Normal respiratory effort.  No retractions.  Musculoskeletal: Tenderness to palpation of the left buttock, no palpable hematoma.  Pain radiates down his leg but he has no other tenderness to palpation.  Normal range of motion, able to bear weight without too much difficulty but does have a limp Neurologic:  Normal speech and language. No gross focal neurologic deficits are appreciated.  Skin:  Skin is warm, dry and intact. No rash noted. Psychiatric: Mood and affect are normal. Speech and behavior are normal.  ____________________________________________   LABS (all labs ordered are listed, but only abnormal results are displayed)  Labs Reviewed - No data to display ____________________________________________  EKG  None - EKG not ordered by ED physician ____________________________________________  RADIOLOGY I, Loleta Rose, personally viewed and evaluated these images (plain radiographs) as part of my medical decision making, as well as reviewing the written report by the radiologist.  ED MD interpretation: No evidence of fracture or dislocation  Official radiology report(s): Dg Hip Unilat W Or Wo Pelvis 2-3 Views Left  Result Date: 09/14/2017 CLINICAL DATA:  Status post fall, with injury to the left hip. Initial encounter. EXAM: DG HIP (WITH OR WITHOUT PELVIS) 2-3V LEFT COMPARISON:  None. FINDINGS: There is no evidence of fracture or dislocation. Both femoral heads are seated normally within their  respective acetabula. The proximal left femur appears intact. No significant degenerative change is appreciated. The sacroiliac joints are unremarkable in appearance. The visualized bowel gas pattern is grossly unremarkable in appearance. Scattered phleboliths are noted within the pelvis. IMPRESSION: No evidence of fracture or dislocation. Electronically Signed   By: Roanna Raider M.D.   On: 09/14/2017 02:42    ____________________________________________   PROCEDURES  Critical Care performed: No   Procedure(s) performed:   Procedures   ____________________________________________   INITIAL IMPRESSION / ASSESSMENT AND PLAN / ED COURSE  As part of my medical decision making, I reviewed the following data within the electronic MEDICAL RECORD NUMBER Nursing notes reviewed and incorporated and Radiograph reviewed     No evidence of fracture or dislocation.  Most likely patient has a contusion to the left buttock that is causing some impingement on the sciatic nerve and resulting in the rest of the symptoms.  I will give him a course of tramadol and encourage the use of heating pad and follow-up with his regular doctor.  No indication for CT scan given that he is weightbearing and generally healthy.  He has no evidence of any other injuries and he reports it was a simple mechanical fall.  I gave my usual and customary return precautions.      ____________________________________________  FINAL CLINICAL IMPRESSION(S) / ED DIAGNOSES  Final diagnoses:  Left buttock pain  Left hip pain  Contusion of left hip, initial encounter     MEDICATIONS GIVEN DURING THIS VISIT:  Medications - No data to display   ED Discharge Orders    None       Note:  This document was prepared using Dragon voice recognition software and may include unintentional dictation errors.    Loleta Rose, MD 09/14/17 [phone]

## 2017-09-14 NOTE — Discharge Instructions (Signed)

## 2017-09-26 ENCOUNTER — Telehealth: Payer: Self-pay | Admitting: Cardiovascular Disease

## 2017-09-26 NOTE — Telephone Encounter (Signed)
Patient has appointment with Dr Mariah Milling on 10/02/17. Routing to Dr Mariah Milling for review.

## 2017-09-26 NOTE — Telephone Encounter (Signed)
° °  [facility] Pre-operative Risk Assessment    Request for surgical clearance:  1. What type of surgery is being performed? 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 When is this surgery scheduled? 10-25-17  3. What type of clearance is required (medical clearance vs. Pharmacy clearance to hold med vs. Both)? both  4. Are there any medications that need to be held prior to surgery and how long? plavix and asa time not noted on form   5. Practice name and name of physician performing surgery? [facility]   6. What is your office phone number [phone]   7.   What is your office fax number [phone]  8.   Anesthesia type (None, local, MAC, general) ? General for approx. 3 hours   Clarisse Gouge 09/26/2017, 10:55 AM  _________________________________________________________________   (provider comments below)
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
## 2017-10-02 ENCOUNTER — Ambulatory Visit (INDEPENDENT_AMBULATORY_CARE_PROVIDER_SITE_OTHER): Payer: Medicare Other | Admitting: Cardiovascular Disease

## 2017-10-02 ENCOUNTER — Encounter: Payer: Self-pay | Admitting: Cardiovascular Disease

## 2017-10-02 VITALS — BP 124/58 | HR 53 | Ht 69.0 in | Wt 152.5 lb

## 2017-10-02 DIAGNOSIS — I10 Essential (primary) hypertension: Secondary | ICD-10-CM | POA: Diagnosis not present

## 2017-10-02 DIAGNOSIS — J849 Interstitial pulmonary disease, unspecified: Secondary | ICD-10-CM

## 2017-10-02 DIAGNOSIS — I25118 Atherosclerotic heart disease of native coronary artery with other forms of angina pectoris: Secondary | ICD-10-CM | POA: Diagnosis not present

## 2017-10-02 DIAGNOSIS — E785 Hyperlipidemia, unspecified: Secondary | ICD-10-CM | POA: Diagnosis not present

## 2017-10-02 DIAGNOSIS — R0602 Shortness of breath: Secondary | ICD-10-CM | POA: Diagnosis not present

## 2017-10-02 NOTE — Patient Instructions (Addendum)
Medication Instructions:   Stop the plavix  Labwork:  Today we will check a lipid panel and HBA1C  Testing/Procedures:  No further testing at this time   Follow-Up: It was a pleasure seeing you in the office today. Please call us if you have new issues that need to be addressed before your next appt.  [phone]  Your physician wants you to follow-up in: 12 months.  You will receive a reminder letter in the mail two months in advance. If you don't receive a letter, please call our office to schedule the follow-up appointment.  If you need a refill on your cardiac medications before your next appointment, please call your pharmacy.  For educational health videos Log in to : [email or web address] Or : [email or web address], password : [email or web address]

## 2017-10-03 LAB — LIPID PANEL
CHOLESTEROL TOTAL: 94 mg/dL — AB (ref 100–199)
Chol/HDL Ratio: 2.8 ratio (ref 0.0–5.0)
HDL: 34 mg/dL — ABNORMAL LOW (ref >39)
LDL CALC: 44 mg/dL (ref 0–99)
Triglycerides: 81 mg/dL (ref 0–149)
VLDL CHOLESTEROL CAL: 16 mg/dL (ref 5–40)

## 2017-10-03 LAB — HEMOGLOBIN A1C
ESTIMATED AVERAGE GLUCOSE: 137 mg/dL
HEMOGLOBIN A1C: 6.4 % — AB (ref 4.8–5.6)

## 2017-10-03 NOTE — Telephone Encounter (Signed)
Acceptable risk for surgery He is now off plavix He will stay on asa 81 daily

## 2017-10-04 NOTE — Telephone Encounter (Signed)
Clearance routed to number listed. 

## 2017-10-12 ENCOUNTER — Telehealth: Payer: Self-pay | Admitting: Cardiovascular Disease

## 2017-10-12 NOTE — Telephone Encounter (Signed)
Spoke with patients wife per release form and advised that we do not prescribe this medication and he would need to see his PCP or other provider. She verbalized understanding and had no further questions. Confirmed phone numbers with her and she reports that Kawan's number is [phone] and her number is [phone]. She was appreciative for the call back.

## 2017-10-12 NOTE — Telephone Encounter (Signed)
Shane Reid calling to let us know patient is having pain in legs and she heard Neurontin was good for this .

## 2017-11-07 ENCOUNTER — Emergency Department
Admission: EM | Admit: 2017-11-07 | Discharge: 2017-11-07 | Disposition: A | Discharge status: Home / Self Care | Payer: Medicare Other | Attending: Emergency Medicine | Admitting: Emergency Medicine

## 2017-11-07 ENCOUNTER — Other Ambulatory Visit: Payer: Self-pay

## 2017-11-07 ENCOUNTER — Emergency Department: Payer: Medicare Other

## 2017-11-07 DIAGNOSIS — Z87891 Personal history of nicotine dependence: Secondary | ICD-10-CM | POA: Diagnosis not present

## 2017-11-07 DIAGNOSIS — I2510 Atherosclerotic heart disease of native coronary artery without angina pectoris: Secondary | ICD-10-CM | POA: Diagnosis not present

## 2017-11-07 DIAGNOSIS — Z955 Presence of coronary angioplasty implant and graft: Secondary | ICD-10-CM | POA: Diagnosis not present

## 2017-11-07 DIAGNOSIS — I110 Hypertensive heart disease with heart failure: Secondary | ICD-10-CM | POA: Diagnosis not present

## 2017-11-07 DIAGNOSIS — I509 Heart failure, unspecified: Secondary | ICD-10-CM | POA: Diagnosis not present

## 2017-11-07 DIAGNOSIS — M25572 Pain in left ankle and joints of left foot: Secondary | ICD-10-CM | POA: Insufficient documentation

## 2017-11-07 DIAGNOSIS — Z79899 Other long term (current) drug therapy: Secondary | ICD-10-CM | POA: Diagnosis not present

## 2017-11-07 MED ORDER — MELOXICAM 7.5 MG PO TABS
ORAL_TABLET | ORAL | Status: AC
  Administered 2017-11-07: 15 mg via ORAL
  Filled 2017-11-07: qty 2

## 2017-11-07 MED ORDER — MELOXICAM 7.5 MG PO TABS
15.0000 mg | ORAL_TABLET | Freq: Every day | ORAL | Status: DC
  Administered 2017-11-07: 15 mg via ORAL

## 2017-11-07 MED ORDER — MELOXICAM 15 MG PO TABS: 15.0000 mg | ORAL_TABLET | Freq: Every day | ORAL | 1 refills | Status: AC

## 2017-11-07 NOTE — ED Triage Notes (Addendum)
Pt arrives to ED via POV from home with c/o intermittent LEFT ankle pain x1 month with no reported injury or trauma. Pt ambulatory without difficulty.

## 2017-11-07 NOTE — ED Notes (Signed)
Pt reports left ankle pain for last month, denies inj or swelling, pt able to bear weight on ankle and able to ambulate.
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
## 2017-12-02 ENCOUNTER — Other Ambulatory Visit: Payer: Self-pay | Admitting: Cardiovascular Disease

## 2018-01-11 ENCOUNTER — Emergency Department
Admission: EM | Admit: 2018-01-11 | Discharge: 2018-01-11 | Disposition: A | Discharge status: Home / Self Care | Payer: Medicare Other | Attending: Emergency Medicine | Admitting: Emergency Medicine

## 2018-01-11 ENCOUNTER — Encounter: Payer: Self-pay | Admitting: Emergency Medicine

## 2018-01-11 ENCOUNTER — Other Ambulatory Visit: Payer: Self-pay

## 2018-01-11 DIAGNOSIS — Z79899 Other long term (current) drug therapy: Secondary | ICD-10-CM | POA: Diagnosis not present

## 2018-01-11 DIAGNOSIS — M25552 Pain in left hip: Secondary | ICD-10-CM | POA: Diagnosis present

## 2018-01-11 DIAGNOSIS — I2510 Atherosclerotic heart disease of native coronary artery without angina pectoris: Secondary | ICD-10-CM | POA: Insufficient documentation

## 2018-01-11 DIAGNOSIS — J449 Chronic obstructive pulmonary disease, unspecified: Secondary | ICD-10-CM | POA: Insufficient documentation

## 2018-01-11 DIAGNOSIS — M5432 Sciatica, left side: Secondary | ICD-10-CM | POA: Diagnosis not present

## 2018-01-11 DIAGNOSIS — Z87891 Personal history of nicotine dependence: Secondary | ICD-10-CM | POA: Insufficient documentation

## 2018-01-11 DIAGNOSIS — I110 Hypertensive heart disease with heart failure: Secondary | ICD-10-CM | POA: Diagnosis not present

## 2018-01-11 DIAGNOSIS — I509 Heart failure, unspecified: Secondary | ICD-10-CM | POA: Insufficient documentation

## 2018-01-11 NOTE — ED Provider Notes (Signed)
[facility] Emergency Department Provider Note  ____________________________________________  Time seen: Approximately 7:00 PM  I have reviewed the triage vital signs and the nursing notes.   HISTORY  Chief Complaint Hip Pain    HPI Shane Reid. is a 72 y.o. male who presents the emergency department requesting clarification on his medications.  Patient has been experiencing ongoing lower back radiating to the left leg pain for approximately 6 months.  Patient saw a urgent care at the start of the week, was placed on meloxicam and a slow increasing taper of gabapentin.  He saw his primary care today who gave him another prescription of meloxicam and gabapentin.  Patient received 7/2 mg of meloxicam at urgent care and primary care adjust the dose to 15 mg.  Patient was also confused as he had received 100 mg tablets of gabapentin at urgent care 300 mg tablets of gabapentin from his primary care.  Patient denies any new trauma.  No bowel or bladder dysfunction, saddle anesthesia, paresthesias.  No urinary complaints.  Patient is here requesting clarification on his medication only.  Patient has a significant medical history to include anxiety, asthma, CHF, COPD, pulmonary fibrosis, coronary artery disease, depression, GERD, hypercholesterolemia, hypertension.  No complaints with chronic medical problems at this time.    Past Medical History:  Diagnosis Date  . Anxiety   . Asthma   . Bradycardia   . CHF (congestive heart failure) (HCC)   . Chronic Chest Pain   . COPD (chronic obstructive pulmonary disease) (HCC)   . Coronary artery disease    a. 2008 s/p PCI to LAD;  b. 2013 Cath: patent stent->Med Rx;  c. 11/2013 MV: EF 67%, no ischemia/infarct.  . Depression   . GERD (gastroesophageal reflux disease)   . Heart murmur    a. 02/2010 Echo: EF 55-60%, no rwma, Gr 2 DD, triv MR, mildly dil LA, nl RV.  [name] Hypercholesteremia   . Hypertension   . Hypertensive  heart disease   . Idiopathic pulmonary fibrosis (HCC)    a. Seen by Dr. Dema Severin 01/2015 and Rx Esbriet - pt cannot afford.  . Other social stressor    a. wife with mental illness    Patient Active Problem List   Diagnosis Date Noted  . 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 Benign localized hyperplasia of prostate with urinary obstruction 03/01/2017  . Incomplete emptying of bladder 03/01/2017  . Scrotal pain 09/11/2016  . Oropharyngeal dysphagia 09/21/2015  . Hypertensive heart disease   . Chronic Chest Pain   . Ischemic heart disease   . Hypercholesteremia   . 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 IPF (idiopathic pulmonary fibrosis) (HCC) 12/23/2014  . Pre-operative cardiovascular examination 11/05/2014  . ILD (interstitial lung disease) (HCC) 09/04/2014  . Interstitial lung disease (HCC) 06/22/2014  . Upper respiratory infection 12/03/2013  . 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 CAROTID BRUIT, LEFT 05/12/2010  . Hyperlipidemia 07/01/2009  . CAD, NATIVE VESSEL 07/01/2009  . SHORTNESS OF BREATH 07/01/2009  . Atypical chest pain 07/01/2009  . Family history of early CAD 01/23/2007  . Anxiety and depression 02/02/2005    Past Surgical History:  Procedure Laterality Date  . CARDIAC CATHETERIZATION    . CORONARY ANGIOPLASTY     stents times 4  . Esophagus stretch    . EYE SURGERY     Bilateral cataracts  . LUNG BIOPSY    . NASAL ENDOSCOPY N/A 12/21/2016   Procedure: NASAL ENDOSCOPY;  Surgeon: Vernie Murders, MD;  Location: [facility];  Service: ENT;  Laterality: N/A;  . TESTICLE SURGERY    . VIDEO ASSISTED THORACOSCOPY (VATS)/THOROCOTOMY Right 11/18/2014   Procedure: VIDEO ASSISTED THORACOSCOPY (VATS)/ wedge resection biopsy;  Surgeon: Hulda Marin, MD;  Location: [facility];  Service: General;  Laterality: Right;    Prior to Admission medications   Medication Sig Start Date End Date Taking? Authorizing Provider  albuterol (PROVENTIL)  (2.5 MG/3ML) 0.083% nebulizer solution Take 3 mLs (2.5 mg total) by nebulization every 6 (six) hours as needed. 05/02/12   Antonieta Iba, MD  amLODipine-benazepril (LOTREL) 5-10 MG capsule Take 1 capsule by mouth daily. 07/30/17   Antonieta Iba, MD  aspirin EC 81 MG tablet Take 81 mg by mouth every morning. 08/06/12   [provider]  guaiFENesin (ROBITUSSIN) 100 MG/5ML SOLN Take 5 mLs (100 mg total) by mouth every 4 (four) hours as needed for cough or to loosen phlegm. 11/15/16   Sharman Cheek, MD  Multiple Vitamins-Minerals (CENTRUM SILVER PO) Take by mouth daily.    [provider]  nitroGLYCERIN (NITROSTAT) 0.4 MG SL tablet Place 1 tablet (0.4 mg total) under the tongue every 5 (five) minutes as needed. 07/30/17   Antonieta Iba, MD  rosuvastatin (CRESTOR) 10 MG tablet TAKE 1 TABLET BY MOUTH EVERY DAY 12/03/17   Antonieta Iba, MD    Allergies Metoprolol succinate; Niacin; and Sertraline hcl  Family History  Problem Relation Age of Onset  . Coronary artery disease Mother        s/p CABG  . Heart failure Mother   . Heart disease Other   . Depression Other     Social History Social History   Tobacco Use  . Smoking status: Former Smoker    Packs/day: 0.50    Years: 1.00    Pack years: 0.50    Types: Cigarettes    Last attempt to quit: 05/09/1968    Years since quitting: 49.7  . Smokeless tobacco: Never Used  Substance Use Topics  . Alcohol use: No  . Drug use: No     Review of Systems  Constitutional: No fever/chills Eyes: No visual changes. No discharge ENT: No upper respiratory complaints. Cardiovascular: no chest pain. Respiratory: no cough. No SOB. Gastrointestinal: No abdominal pain.  No nausea, no vomiting.  No diarrhea.  No constipation. Genitourinary: Negative for dysuria. No hematuria. Musculoskeletal: Positive for lower back pain radiating to the left lower extremity Skin: Negative for rash, abrasions, lacerations,  ecchymosis. Neurological: Negative for headaches, focal weakness or numbness. 10-point ROS otherwise negative.  ____________________________________________   PHYSICAL EXAM:  VITAL SIGNS: ED Triage Vitals  Enc Vitals Group     BP 01/11/18 1814 125/67     Pulse Rate 01/11/18 1814 89     Resp 01/11/18 1814 16     Temp 01/11/18 1814 98.6 F (37 C)     Temp Source 01/11/18 1814 Oral     SpO2 01/11/18 1814 96 %     Weight 01/11/18 1815 160 lb (72.6 kg)     Height 01/11/18 1815 5\' 9"  (1.753 m)     Head Circumference --      Peak Flow --      Pain Score 01/11/18 1815 10     Pain Loc --      Pain Edu? --      Excl. in GC? --      Constitutional: Alert and oriented. Well appearing and in no  acute distress. Eyes: Conjunctivae are normal. PERRL. EOMI. Head: Atraumatic. Neck: No stridor.    Cardiovascular: Normal rate, regular rhythm. Normal S1 and S2.  Good peripheral circulation. Respiratory: Normal respiratory effort without tachypnea or retractions. Lungs CTAB. Good air entry to the bases with no decreased or absent breath sounds. Gastrointestinal: Bowel sounds 4 quadrants. Soft and nontender to palpation. No guarding or rigidity. No palpable masses. No distention. No CVA tenderness. Musculoskeletal: Full range of motion to all extremities. No gross deformities appreciated.  No visible abnormality lumbar spine upon inspection.  Patient has good flexion, extension, rotation of the lumbar spine.  Patient denies any midline tenderness.  Mild left-sided paraspinal muscle tenderness and tenderness to palpation of left-sided sciatic notch.  Negative straight leg raise bilaterally.  Dorsalis pedis pulse intact bilateral lower extremities.  Sensation intact and equal bilateral lower extremities. Neurologic:  Normal speech and language. No gross focal neurologic deficits are appreciated.  Skin:  Skin is warm, dry and intact. No rash noted. Psychiatric: Mood and affect are normal. Speech and  behavior are normal. Patient exhibits appropriate insight and judgement.   ____________________________________________   LABS (all labs ordered are listed, but only abnormal results are displayed)  Labs Reviewed - No data to display ____________________________________________  EKG   ____________________________________________  RADIOLOGY   No results found.  ____________________________________________    PROCEDURES  Procedure(s) performed:    Procedures    Medications - No data to display   ____________________________________________   INITIAL IMPRESSION / ASSESSMENT AND PLAN / ED COURSE  Pertinent labs & imaging results that were available during my care of the patient were reviewed by me and considered in my medical decision making (see chart for details).  Review of the [place] CSRS was performed in accordance of the [facility] prior to dispensing any controlled drugs.      Patient's diagnosis is consistent with sciatica on the left side, encounter for medication management.  Patient presents the emergency department requesting clarification on already prescribed medications.  Patient and received different dosing from urgent care and his primary care today.  Patient was unsure which dosing, which medication he should take.  I explained dosing the patient, he verbalizes understanding of same.  No new prescriptions at this time.  Patient is given referral to neurosurgery if sciatica continues after medication trial.  Otherwise, patient can follow-up with primary care as needed.  Patient is given ED precautions to return to the ED for any worsening or new symptoms.     ____________________________________________  FINAL CLINICAL IMPRESSION(S) / ED DIAGNOSES  Final diagnoses:  Sciatica of left side  Medication management      NEW MEDICATIONS STARTED DURING THIS VISIT:  ED Discharge Orders    None          This chart was dictated using voice  recognition software/Dragon. Despite best efforts to proofread, errors can occur which can change the meaning. Any change was purely unintentional.    Racheal Patches, PA-C 01/11/18 1912    Minna Antis, MD 01/11/18 2314

## 2018-01-11 NOTE — ED Triage Notes (Addendum)
Pt to ED from home with c/o chronic hip pain, pt states worsening today. Pt denies any acute injury. Pt states pain radiating down to ankle. PT ambulatory, VSS. PT was seen at PCP today and given rx for gabapentin and meloxicam but has not been filled.

## 2018-02-14 ENCOUNTER — Telehealth: Payer: Self-pay | Admitting: Cardiovascular Disease

## 2018-02-14 ENCOUNTER — Other Ambulatory Visit: Payer: Self-pay

## 2018-02-14 MED ORDER — ROSUVASTATIN CALCIUM 10 MG PO TABS: 10.0000 mg | ORAL_TABLET | Freq: Every day | ORAL | 0 refills | Status: DC

## 2018-02-14 MED ORDER — AMLODIPINE BESY-BENAZEPRIL HCL 5-10 MG PO CAPS: 1.0000 | ORAL_CAPSULE | Freq: Every day | ORAL | 0 refills | Status: DC

## 2018-02-14 NOTE — Telephone Encounter (Signed)
°*  STAT* If patient is at the pharmacy, call can be transferred to refill team.   1. Which medications need to be refilled? (please list name of each medication and dose if known)      Amlodipine 5-10 mg po q day      Rosuvastatin 10 mg po q day   2. Which pharmacy/location (including street and city if local pharmacy) is medication to be sent to? [facility] [address] [place]   3. Do they need a 30 day or 90 day supply? 90

## 2018-03-24 ENCOUNTER — Emergency Department: Payer: Medicare Other

## 2018-03-24 ENCOUNTER — Emergency Department
Admission: EM | Admit: 2018-03-24 | Discharge: 2018-03-24 | Disposition: A | Discharge status: Home / Self Care | Payer: Medicare Other | Attending: Emergency Medicine | Admitting: Emergency Medicine

## 2018-03-24 ENCOUNTER — Encounter: Payer: Self-pay | Admitting: Emergency Medicine

## 2018-03-24 ENCOUNTER — Other Ambulatory Visit: Payer: Self-pay

## 2018-03-24 DIAGNOSIS — Z955 Presence of coronary angioplasty implant and graft: Secondary | ICD-10-CM | POA: Insufficient documentation

## 2018-03-24 DIAGNOSIS — I509 Heart failure, unspecified: Secondary | ICD-10-CM | POA: Insufficient documentation

## 2018-03-24 DIAGNOSIS — Z87891 Personal history of nicotine dependence: Secondary | ICD-10-CM | POA: Insufficient documentation

## 2018-03-24 DIAGNOSIS — J449 Chronic obstructive pulmonary disease, unspecified: Secondary | ICD-10-CM | POA: Insufficient documentation

## 2018-03-24 DIAGNOSIS — I110 Hypertensive heart disease with heart failure: Secondary | ICD-10-CM | POA: Diagnosis not present

## 2018-03-24 DIAGNOSIS — Z79899 Other long term (current) drug therapy: Secondary | ICD-10-CM | POA: Diagnosis not present

## 2018-03-24 DIAGNOSIS — J45909 Unspecified asthma, uncomplicated: Secondary | ICD-10-CM | POA: Diagnosis not present

## 2018-03-24 DIAGNOSIS — I2510 Atherosclerotic heart disease of native coronary artery without angina pectoris: Secondary | ICD-10-CM | POA: Diagnosis not present

## 2018-03-24 DIAGNOSIS — F329 Major depressive disorder, single episode, unspecified: Secondary | ICD-10-CM | POA: Diagnosis not present

## 2018-03-24 DIAGNOSIS — M79605 Pain in left leg: Secondary | ICD-10-CM | POA: Diagnosis not present

## 2018-03-24 DIAGNOSIS — F419 Anxiety disorder, unspecified: Secondary | ICD-10-CM | POA: Diagnosis not present

## 2018-03-24 MED ORDER — TRAMADOL HCL 50 MG PO TABS: 50.0000 mg | ORAL_TABLET | Freq: Four times a day (QID) | ORAL | 0 refills | Status: DC | PRN

## 2018-03-24 MED ORDER — TRAMADOL HCL 50 MG PO TABS
50.0000 mg | ORAL_TABLET | Freq: Once | ORAL | Status: AC
  Administered 2018-03-24: 50 mg via ORAL
  Filled 2018-03-24: qty 1

## 2018-03-24 NOTE — ED Notes (Signed)
Patient c/o left lower leg pain, ant site of abrasion to anterior lower left leg. Patient reports swelling at site. Patient c/o constant nerve pain/spasm. Patient reports pain has been ongoing for 6 months. Patient was prescribed gabapentin 300mg  TID 2 months ago. Patient deniens any relief of pain/symptoms with medication

## 2018-03-24 NOTE — ED Notes (Signed)
Leg has no swelling, no redness, states was seen yesterday as well for his lungs.

## 2018-03-24 NOTE — ED Triage Notes (Signed)
Pt states left leg pain - pt has a small sore in the area where he says he has pain. States was seen and placed on gabapentin but it doesn't work

## 2018-03-24 NOTE — Discharge Instructions (Addendum)
Follow-up with your regular doctor for evaluation of the continued left leg pain.  You may also call Dr. Wyn Quaker who is a vascular doctor.  Your x-ray is negative and your CT of your lumbar spine did not show any bulging disc which would be causing the nerve pain.  Therefore most likely it is located directly in the leg itself.  Continue to take the gabapentin.  Take an anti-inflammatory medicine.  You have been prescribed tramadol for pain as needed.

## 2018-03-24 NOTE — ED Provider Notes (Signed)
[facility] Emergency Department Provider Note  ____________________________________________   First MD Initiated Contact with Patient 03/24/18 2140     (approximate)  I have reviewed the triage vital signs and the nursing notes.   HISTORY  Chief Complaint Leg Pain    HPI Shane Reid. is a 72 y.o. male resents emergency department complaining of continued anterior left lower leg pain.  He has been seen here several times for the same.  This time he states there is some swelling on the front of the leg and he has several sores noted.  He has been taking the gabapentin 300 mg 3 times daily for 2 months which has not helped.  He denies any fever or chills.  Denies any chest pain or shortness of breath.  He does complain of low back pain.    Past Medical History:  Diagnosis Date  . Anxiety   . Asthma   . Bradycardia   . CHF (congestive heart failure) (HCC)   . Chronic Chest Pain   . COPD (chronic obstructive pulmonary disease) (HCC)   . Coronary artery disease    a. 2008 s/p PCI to LAD;  b. 2013 Cath: patent stent->Med Rx;  c. 11/2013 MV: EF 67%, no ischemia/infarct.  . Depression   . GERD (gastroesophageal reflux disease)   . Heart murmur    a. 02/2010 Echo: EF 55-60%, no rwma, Gr 2 DD, triv MR, mildly dil LA, nl RV.  [name] Hypercholesteremia   . Hypertension   . Hypertensive heart disease   . Idiopathic pulmonary fibrosis (HCC)    a. Seen by Dr. Dema Severin 01/2015 and Rx Esbriet - pt cannot afford.  . Other social stressor    a. wife with mental illness    Patient Active Problem List   Diagnosis Date Noted  . 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 Benign localized hyperplasia of prostate with urinary obstruction 03/01/2017  . Incomplete emptying of bladder 03/01/2017  . Scrotal pain 09/11/2016  . Oropharyngeal dysphagia 09/21/2015  . Hypertensive heart disease   . Chronic Chest Pain   . Ischemic heart disease   . Hypercholesteremia   . Adjustment  disorder with mixed disturbance of emotions and conduct 12/25/2014  . IPF (idiopathic pulmonary fibrosis) (HCC) 12/23/2014  . Pre-operative cardiovascular examination 11/05/2014  . ILD (interstitial lung disease) (HCC) 09/04/2014  . Interstitial lung disease (HCC) 06/22/2014  . Upper respiratory infection 12/03/2013  . 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 CAROTID BRUIT, LEFT 05/12/2010  . Hyperlipidemia 07/01/2009  . CAD, NATIVE VESSEL 07/01/2009  . SHORTNESS OF BREATH 07/01/2009  . Atypical chest pain 07/01/2009  . Family history of early CAD 01/23/2007  . Anxiety and depression 02/02/2005    Past Surgical History:  Procedure Laterality Date  . CARDIAC CATHETERIZATION    . CORONARY ANGIOPLASTY     stents times 4  . Esophagus stretch    . EYE SURGERY     Bilateral cataracts  . LUNG BIOPSY    . NASAL ENDOSCOPY N/A 12/21/2016   Procedure: NASAL ENDOSCOPY;  Surgeon: Vernie Murders, MD;  Location: [facility];  Service: ENT;  Laterality: N/A;  . TESTICLE SURGERY    . VIDEO ASSISTED THORACOSCOPY (VATS)/THOROCOTOMY Right 11/18/2014   Procedure: VIDEO ASSISTED THORACOSCOPY (VATS)/ wedge resection biopsy;  Surgeon: Hulda Marin, MD;  Location: [facility];  Service: General;  Laterality: Right;    Prior to Admission medications   Medication Sig Start Date End Date Taking? Authorizing Provider  albuterol (PROVENTIL) (2.5  MG/3ML) 0.083% nebulizer solution Take 3 mLs (2.5 mg total) by nebulization every 6 (six) hours as needed. 05/02/12   Antonieta Iba, MD  amLODipine-benazepril (LOTREL) 5-10 MG capsule Take 1 capsule by mouth daily. 02/14/18   Antonieta Iba, MD  aspirin EC 81 MG tablet Take 81 mg by mouth every morning. 08/06/12   [provider]  guaiFENesin (ROBITUSSIN) 100 MG/5ML SOLN Take 5 mLs (100 mg total) by mouth every 4 (four) hours as needed for cough or to loosen phlegm. 11/15/16   Sharman Cheek, MD  Multiple Vitamins-Minerals (CENTRUM SILVER PO) Take by mouth  daily.    [provider]  nitroGLYCERIN (NITROSTAT) 0.4 MG SL tablet Place 1 tablet (0.4 mg total) under the tongue every 5 (five) minutes as needed. 07/30/17   Antonieta Iba, MD  rosuvastatin (CRESTOR) 10 MG tablet Take 1 tablet (10 mg total) by mouth daily. 02/14/18   Antonieta Iba, MD  traMADol (ULTRAM) 50 MG tablet Take 1 tablet (50 mg total) by mouth every 6 (six) hours as needed. 03/24/18   Faythe Ghee, PA-C    Allergies Metoprolol succinate; Niacin; and Sertraline hcl  Family History  Problem Relation Age of Onset  . Coronary artery disease Mother        s/p CABG  . Heart failure Mother   . Heart disease Other   . Depression Other     Social History Social History   Tobacco Use  . Smoking status: Former Smoker    Packs/day: 0.50    Years: 1.00    Pack years: 0.50    Types: Cigarettes    Last attempt to quit: 05/09/1968    Years since quitting: 49.9  . Smokeless tobacco: Never Used  Substance Use Topics  . Alcohol use: No  . Drug use: No    Review of Systems  Constitutional: No fever/chills Eyes: No visual changes. ENT: No sore throat. Respiratory: Denies cough Genitourinary: Negative for dysuria. Musculoskeletal: Positive for back pain.  Positive for left lower leg pain Skin: Negative for rash.    ____________________________________________   PHYSICAL EXAM:  VITAL SIGNS: ED Triage Vitals  Enc Vitals Group     BP 03/24/18 1948 (!) 146/100     Pulse Rate 03/24/18 1948 73     Resp 03/24/18 1948 18     Temp 03/24/18 1948 98 F (36.7 C)     Temp Source 03/24/18 1948 Oral     SpO2 03/24/18 1948 97 %     Weight 03/24/18 1949 165 lb (74.8 kg)     Height 03/24/18 1949 5\' 9"  (1.753 m)     Head Circumference --      Peak Flow --      Pain Score 03/24/18 1948 2     Pain Loc --      Pain Edu? --      Excl. in GC? --     Constitutional: Alert and oriented. Well appearing and in no acute distress. Eyes: Conjunctivae are normal.    Head: Atraumatic. Nose: No congestion/rhinnorhea. Mouth/Throat: Mucous membranes are moist.   Neck:  supple no lymphadenopathy noted Cardiovascular: Normal rate, regular rhythm. Heart sounds are normal Respiratory: Normal respiratory effort.  No retractions, lungs c t a  GU: deferred Musculoskeletal: FROM all extremities, warm and well perfused, the left lower leg is tender to palpation at the anterior only.  No calf tenderness is noted.  There are 3 sores noted on the lower leg.  Neurovascular  is grossly intact. Neurologic:  Normal speech and language.  Skin:  Skin is warm, dry and intact. No rash noted. Psychiatric: Mood and affect are normal. Speech and behavior are normal.  ____________________________________________   LABS (all labs ordered are listed, but only abnormal results are displayed)  Labs Reviewed - No data to display ____________________________________________   ____________________________________________  RADIOLOGY  X-ray left tib-fib is negative CT lumbar spine is negative for bulging disc  ____________________________________________   PROCEDURES  Procedure(s) performed: No  Procedures    ____________________________________________   INITIAL IMPRESSION / ASSESSMENT AND PLAN / ED COURSE  Pertinent labs & imaging results that were available during my care of the patient were reviewed by me and considered in my medical decision making (see chart for details).   Patient is a 72 year old male presents emergency department complaining of left lower leg pain.  He has been seen here previously for the same.  On physical exam patient appears well.  The left lower leg has minimal swelling noted at the anterior ear.  X-ray of the left lower leg is negative CT of the lumbar spine is negative  The patient was given the test results.  He is to follow-up with vascular and orthopedics.  I explained to him that this could be peripheral vascular disease and  he should be evaluated weighted.  He states he understands will comply.  Was given a prescription for tramadol for pain and is to continue taking the gabapentin.  He states he understands will comply was discharged in stable condition     As part of my medical decision making, I reviewed the following data within the electronic MEDICAL RECORD NUMBER Nursing notes reviewed and incorporated, Old chart reviewed, Radiograph reviewed x-ray left lower leg is negative, CT of the lumbar spine is negative, Notes from prior ED visits and [place] Controlled Substance Database  ____________________________________________   FINAL CLINICAL IMPRESSION(S) / ED DIAGNOSES  Final diagnoses:  Left leg pain      NEW MEDICATIONS STARTED DURING THIS VISIT:  Discharge Medication List as of 03/24/2018 10:48 PM    START taking these medications   Details  traMADol (ULTRAM) 50 MG tablet Take 1 tablet (50 mg total) by mouth every 6 (six) hours as needed., Starting Sun 03/24/2018, Normal         Note:  This document was prepared using Dragon voice recognition software and may include unintentional dictation errors.    Faythe Ghee, PA-C 03/24/18 2350    Arnaldo Natal, MD 03/25/18 [phone]

## 2018-04-09 ENCOUNTER — Encounter (INDEPENDENT_AMBULATORY_CARE_PROVIDER_SITE_OTHER): Payer: Self-pay | Admitting: Vascular Surgery

## 2018-04-09 ENCOUNTER — Ambulatory Visit (INDEPENDENT_AMBULATORY_CARE_PROVIDER_SITE_OTHER): Payer: Medicare Other | Admitting: Vascular Surgery

## 2018-04-09 VITALS — BP 122/67 | HR 78 | Resp 16 | Ht 69.0 in | Wt 160.8 lb

## 2018-04-09 DIAGNOSIS — I10 Essential (primary) hypertension: Secondary | ICD-10-CM | POA: Diagnosis not present

## 2018-04-09 DIAGNOSIS — E785 Hyperlipidemia, unspecified: Secondary | ICD-10-CM

## 2018-04-09 DIAGNOSIS — I25118 Atherosclerotic heart disease of native coronary artery with other forms of angina pectoris: Secondary | ICD-10-CM | POA: Diagnosis not present

## 2018-04-09 DIAGNOSIS — M79609 Pain in unspecified limb: Secondary | ICD-10-CM | POA: Insufficient documentation

## 2018-04-09 DIAGNOSIS — M79605 Pain in left leg: Secondary | ICD-10-CM | POA: Diagnosis not present

## 2018-04-09 DIAGNOSIS — Z87891 Personal history of nicotine dependence: Secondary | ICD-10-CM

## 2018-04-09 NOTE — Patient Instructions (Signed)

## 2018-04-09 NOTE — Progress Notes (Signed)
Patient ID: Shane Shirts., male   DOB: [birth year], 72 y.o.   MRN: [id number]  Chief Complaint  Patient presents with  . New Patient (Initial Visit)    ref for vascular disease    HPI Shane Pavlak. is a 72 y.o. male.  Patient present for evaluation of left leg pain.  The patient has been bothered by left leg pain for months to years at this point.  He says now, it is basically there all the time.  He describes it is quite severe.  It starts down around his ankle and foot and sometimes radiates up towards his hip.  There is really no right leg symptoms to speak of.  No ulceration or infection.  He says he went to the hospital last week where they did some x-rays and saw no problems with his back or his joints.  He has a history of 4 stents in his heart and was told by the ER doctors they thought his leg pain was from poor circulation recommended he see a vascular surgeon.  Walking does make the pain worse.  Nothing really makes it better.  There is no clear inciting event or causative factor that started the symptoms.  Current Outpatient Medications  Medication Sig Dispense Refill  . albuterol (PROVENTIL) (2.5 MG/3ML) 0.083% nebulizer solution Take 3 mLs (2.5 mg total) by nebulization every 6 (six) hours as needed. 75 mL 11  . amLODipine-benazepril (LOTREL) 5-10 MG capsule Take 1 capsule by mouth daily. 90 capsule 0  . aspirin EC 81 MG tablet Take 81 mg by mouth every morning.    . gabapentin (NEURONTIN) 300 MG capsule TAKE 1 TAB BY MOUTH 3 TIMES A DAY FOR NERVE PAIN  3  . guaiFENesin (ROBITUSSIN) 100 MG/5ML SOLN Take 5 mLs (100 mg total) by mouth every 4 (four) hours as needed for cough or to loosen phlegm. 120 mL 0  . Multiple Vitamins-Minerals (CENTRUM SILVER PO) Take by mouth daily.    . nitroGLYCERIN (NITROSTAT) 0.4 MG SL tablet Place 1 tablet (0.4 mg total) under the tongue every 5 (five) minutes as needed. 25 tablet 0  . rosuvastatin (CRESTOR) 10 MG tablet Take 1 tablet  (10 mg total) by mouth daily. 90 tablet 0  . traMADol (ULTRAM) 50 MG tablet Take 1 tablet (50 mg total) by mouth every 6 (six) hours as needed. (Patient not taking: Reported on 04/09/2018) 15 tablet 0   No current facility-administered medications for this visit.      Past Medical History:  Diagnosis Date  . Anxiety   . Asthma   . Bradycardia   . CHF (congestive heart failure) (HCC)   . Chronic Chest Pain   . COPD (chronic obstructive pulmonary disease) (HCC)   . Coronary artery disease    a. 2008 s/p PCI to LAD;  b. 2013 Cath: patent stent->Med Rx;  c. 11/2013 MV: EF 67%, no ischemia/infarct.  . Depression   . GERD (gastroesophageal reflux disease)   . Heart murmur    a. 02/2010 Echo: EF 55-60%, no rwma, Gr 2 DD, triv MR, mildly dil LA, nl RV.  [name] Hypercholesteremia   . Hypertension   . Hypertensive heart disease   . Idiopathic pulmonary fibrosis (HCC)    a. Seen by Dr. Dema Severin 01/2015 and Rx Esbriet - pt cannot afford.  . Other social stressor    a. wife with mental illness    Past Surgical History:  Procedure Laterality Date  .  CARDIAC CATHETERIZATION    . CORONARY ANGIOPLASTY     stents times 4  . Esophagus stretch    . EYE SURGERY     Bilateral cataracts  . LUNG BIOPSY    . NASAL ENDOSCOPY N/A 12/21/2016   Procedure: NASAL ENDOSCOPY;  Surgeon: Vernie Murders, MD;  Location: [facility];  Service: ENT;  Laterality: N/A;  . TESTICLE SURGERY    . VIDEO ASSISTED THORACOSCOPY (VATS)/THOROCOTOMY Right 11/18/2014   Procedure: VIDEO ASSISTED THORACOSCOPY (VATS)/ wedge resection biopsy;  Surgeon: Hulda Marin, MD;  Location: [facility];  Service: General;  Laterality: Right;    Family History  Problem Relation Age of Onset  . Coronary artery disease Mother        s/p CABG  . Heart failure Mother   . Heart disease Other   . Depression Other   no bleeding or clotting disorders  Social History Social History   Tobacco Use  . Smoking status: Former Smoker     Packs/day: 0.50    Years: 1.00    Pack years: 0.50    Types: Cigarettes    Last attempt to quit: 05/09/1968    Years since quitting: 49.9  . Smokeless tobacco: Never Used  Substance Use Topics  . Alcohol use: No  . Drug use: No     Allergies  Allergen Reactions  . Metoprolol Succinate Other (See Comments)    Reaction:  Unknown   . Niacin Other (See Comments)    Reaction:  Unknown   . Sertraline Hcl Other (See Comments)    Reaction:  Unknown         REVIEW OF SYSTEMS (Negative unless checked)  Constitutional: [] Weight loss  [] Fever  [] Chills Cardiac: [] Chest pain   [] Chest pressure   [] Palpitations   [] Shortness of breath when laying flat   [] Shortness of breath at rest   [x] Shortness of breath with exertion. Vascular:  [] Pain in legs with walking   [] Pain in legs at rest   [] Pain in legs when laying flat   [] Claudication   [] Pain in feet when walking  [] Pain in feet at rest  [] Pain in feet when laying flat   [] History of DVT   [] Phlebitis   [] Swelling in legs   [] Varicose veins   [] Non-healing ulcers Pulmonary:   [] Uses home oxygen   [] Productive cough   [] Hemoptysis   [] Wheeze  [x] COPD   [x] Asthma Neurologic:  [] Dizziness  [] Blackouts   [] Seizures   [] History of stroke   [] History of TIA  [] Aphasia   [] Temporary blindness   [] Dysphagia   [] Weakness or numbness in arms   [] Weakness or numbness in legs Musculoskeletal:  [x] Arthritis   [] Joint swelling   [] Joint pain   [] Low back pain Hematologic:  [] Easy bruising  [] Easy bleeding   [] Hypercoagulable state   [] Anemic  [] Hepatitis  Gastrointestinal:  [] Blood in stool   [] Vomiting blood  [x] Gastroesophageal reflux/heartburn   [] Difficulty swallowing   Genitourinary:  [] Chronic kidney disease   [] Difficult urination  [] Frequent urination  [] Burning with urination   [] Blood in urine Skin:  [] Rashes   [] Ulcers   [] Wounds Psychological:  [x] History of anxiety   []  History of major depression.  Physical Exam BP 122/67 (BP Location:  Right Arm)   Pulse 78   Resp 16   Ht 5\' 9"  (1.753 m)   Wt 160 lb 12.8 oz (72.9 kg)   BMI 23.75 kg/m   Gen:  WD/WN, NAD Head: [place]/AT, No temporalis wasting.  Ear/Nose/Throat: Hearing  grossly intact, nares w/o erythema or drainage, oropharynx w/o Erythema/Exudate Eyes: Sclera non-icteric, conjunctiva clear Neck: Trachea midline.  No JVD.  Pulmonary:  Good air movement, no use of accessory muscles.  Cardiac: RRR, normal S1, S2. Vascular:  Vessel Right Left  Radial Palpable Palpable                          PT  1+ palpable  1+ palpable  DP  2+ palpable  1+ palpable   Gastrointestinal: soft, non-tender/non-distended. No guarding/reflex.  Musculoskeletal: M/S 5/5 throughout.  Extremities without ischemic changes.  No deformity or atrophy.  Trace left lower extremity edema. Varicosities scattered Neurologic:  Sensation grossly intact in extremities.  Symmetrical.  Speech is fluent. Motor exam as listed above. Psychiatric: Judgment intact, Mood & affect appropriate for pt's clinical situation. Dermatologic: No rashes or ulcers noted.  No cellulitis or open wounds.    Radiology Dg Tibia/fibula Left  Result Date: 03/24/2018 CLINICAL DATA:  72 year old male with left lower extremity pain. EXAM: LEFT TIBIA AND FIBULA - 2 VIEW COMPARISON:  Left ankle radiograph dated 11/07/2017 FINDINGS: There is no acute fracture or dislocation. The bones are well mineralized. No significant arthritic changes. The soft tissues appear unremarkable. IMPRESSION: Negative. Electronically Signed   By: Elgie Collard M.D.   On: 03/24/2018 22:29   Ct Lumbar Spine Wo Contrast  Result Date: 03/24/2018 CLINICAL DATA:  72 y/o M; bilateral lower back pain for several years. Numbness in the left toes. Pain in the left lower leg. EXAM: CT LUMBAR SPINE WITHOUT CONTRAST TECHNIQUE: Multidetector CT imaging of the lumbar spine was performed without intravenous contrast administration. Multiplanar CT image  reconstructions were also generated. COMPARISON:  12/29/2015 CT abdomen and pelvis. FINDINGS: Segmentation: 5 lumbar type vertebrae. Diminutive T12 ribs. Alignment: Normal. Vertebrae: No acute fracture or focal pathologic process. Paraspinal and other soft tissues: Abdominal aortic calcific atherosclerosis. Sigmoid diverticulosis. Disc levels: Stable mild lumbar spondylosis with small disc bulges and facet hypertrophy. No high-grade bony foraminal or canal stenosis. IMPRESSION: 1. No acute fracture or malalignment of the lumbar spine. 2. Stable mild lumbar spondylosis. No high-grade bony foraminal or canal stenosis. 3.  Aortic Atherosclerosis (ICD10-I70.0). 4. Sigmoid diverticulosis. Electronically Signed   By: Mitzi Hansen M.D.   On: 03/24/2018 22:37    Labs No results found for this or any previous visit (from the past 2160 hour(s)).  Assessment/Plan:  Hypertension blood pressure control important in reducing the progression of atherosclerotic disease. On appropriate oral medications.   CAD, NATIVE VESSEL 4 previous stents. Follow with cardiology  Hyperlipidemia lipid control important in reducing the progression of atherosclerotic disease. Continue statin therapy   Pain in limb The patient describes symptoms of the left lower extremity that are not entirely clear in their etiology.  Although some of the symptoms sound neuropathic, some are certainly consistent with peripheral arterial disease in a patient with a known history of atherosclerotic disease elsewhere.  For this reason, I think it would be prudent to obtain noninvasive studies for further evaluation.  If significant arterial insufficiency is present and associated with his significant left lower extremity symptoms, consideration for intervention would certainly be given.  We have discussed the natural history and pathophysiology of peripheral arterial disease.  We will see him back following his studies.     Festus Barren 04/09/2018, 12:45 PM   This note was created with Dragon medical dictation system.  Any errors from dictation are unintentional.

## 2018-04-09 NOTE — Assessment & Plan Note (Signed)
The patient describes symptoms of the left lower extremity that are not entirely clear in their etiology.  Although some of the symptoms sound neuropathic, some are certainly consistent with peripheral arterial disease in a patient with a known history of atherosclerotic disease elsewhere.  For this reason, I think it would be prudent to obtain noninvasive studies for further evaluation.  If significant arterial insufficiency is present and associated with his significant left lower extremity symptoms, consideration for intervention would certainly be given.  We have discussed the natural history and pathophysiology of peripheral arterial disease.  We will see him back following his studies.

## 2018-04-09 NOTE — Assessment & Plan Note (Signed)
lipid control important in reducing the progression of atherosclerotic disease. Continue statin therapy  

## 2018-04-09 NOTE — Assessment & Plan Note (Signed)
blood pressure control important in reducing the progression of atherosclerotic disease. On appropriate oral medications.  

## 2018-04-09 NOTE — Assessment & Plan Note (Signed)
4 previous stents. Follow with cardiology

## 2018-05-01 ENCOUNTER — Encounter: Payer: Self-pay | Admitting: Neurology

## 2018-05-01 ENCOUNTER — Ambulatory Visit (INDEPENDENT_AMBULATORY_CARE_PROVIDER_SITE_OTHER): Payer: Medicare Other | Admitting: Nurse Practitioner

## 2018-05-01 ENCOUNTER — Ambulatory Visit (INDEPENDENT_AMBULATORY_CARE_PROVIDER_SITE_OTHER): Payer: Medicare Other

## 2018-05-01 ENCOUNTER — Emergency Department: Payer: Medicare Other

## 2018-05-01 ENCOUNTER — Emergency Department
Admission: EM | Admit: 2018-05-01 | Discharge: 2018-05-02 | Disposition: A | Discharge status: Home / Self Care | Payer: Medicare Other | Attending: Emergency Medicine | Admitting: Emergency Medicine

## 2018-05-01 ENCOUNTER — Encounter: Payer: Self-pay | Admitting: Emergency Medicine

## 2018-05-01 ENCOUNTER — Encounter (INDEPENDENT_AMBULATORY_CARE_PROVIDER_SITE_OTHER): Payer: Self-pay | Admitting: Nurse Practitioner

## 2018-05-01 ENCOUNTER — Other Ambulatory Visit: Payer: Self-pay

## 2018-05-01 VITALS — BP 127/67 | HR 71 | Resp 16 | Ht 69.0 in | Wt 163.4 lb

## 2018-05-01 DIAGNOSIS — M79605 Pain in left leg: Secondary | ICD-10-CM | POA: Diagnosis not present

## 2018-05-01 DIAGNOSIS — E785 Hyperlipidemia, unspecified: Secondary | ICD-10-CM

## 2018-05-01 DIAGNOSIS — J449 Chronic obstructive pulmonary disease, unspecified: Secondary | ICD-10-CM | POA: Diagnosis not present

## 2018-05-01 DIAGNOSIS — Z79899 Other long term (current) drug therapy: Secondary | ICD-10-CM | POA: Diagnosis not present

## 2018-05-01 DIAGNOSIS — Z955 Presence of coronary angioplasty implant and graft: Secondary | ICD-10-CM | POA: Diagnosis not present

## 2018-05-01 DIAGNOSIS — I10 Essential (primary) hypertension: Secondary | ICD-10-CM

## 2018-05-01 DIAGNOSIS — I110 Hypertensive heart disease with heart failure: Secondary | ICD-10-CM | POA: Insufficient documentation

## 2018-05-01 DIAGNOSIS — R0602 Shortness of breath: Secondary | ICD-10-CM | POA: Diagnosis present

## 2018-05-01 DIAGNOSIS — R0789 Other chest pain: Secondary | ICD-10-CM | POA: Insufficient documentation

## 2018-05-01 DIAGNOSIS — I509 Heart failure, unspecified: Secondary | ICD-10-CM | POA: Diagnosis not present

## 2018-05-01 DIAGNOSIS — J40 Bronchitis, not specified as acute or chronic: Secondary | ICD-10-CM | POA: Diagnosis not present

## 2018-05-01 DIAGNOSIS — Z87891 Personal history of nicotine dependence: Secondary | ICD-10-CM | POA: Diagnosis not present

## 2018-05-01 LAB — CBC WITH DIFFERENTIAL/PLATELET
Abs Immature Granulocytes: 0.04 10*3/uL (ref 0.00–0.07)
Basophils Absolute: 0.1 10*3/uL (ref 0.0–0.1)
Basophils Relative: 1 %
EOS PCT: 3 %
Eosinophils Absolute: 0.3 10*3/uL (ref 0.0–0.5)
HCT: 39.1 % (ref 39.0–52.0)
Hemoglobin: 12.8 g/dL — ABNORMAL LOW (ref 13.0–17.0)
Immature Granulocytes: 0 %
Lymphocytes Relative: 23 %
Lymphs Abs: 2.4 10*3/uL (ref 0.7–4.0)
MCH: 28.6 pg (ref 26.0–34.0)
MCHC: 32.7 g/dL (ref 30.0–36.0)
MCV: 87.3 fL (ref 80.0–100.0)
Monocytes Absolute: 1 10*3/uL (ref 0.1–1.0)
Monocytes Relative: 10 %
Neutro Abs: 6.6 10*3/uL (ref 1.7–7.7)
Neutrophils Relative %: 63 %
Platelets: 239 10*3/uL (ref 150–400)
RBC: 4.48 MIL/uL (ref 4.22–5.81)
RDW: 12.8 % (ref 11.5–15.5)
WBC: 10.4 10*3/uL (ref 4.0–10.5)
nRBC: 0 % (ref 0.0–0.2)

## 2018-05-01 LAB — TROPONIN I: Troponin I: <0.03 ng/mL (ref <0.03)

## 2018-05-01 LAB — BASIC METABOLIC PANEL
Anion gap: 9 (ref 5–15)
BUN: 14 mg/dL (ref 8–23)
CO2: 25 mmol/L (ref 22–32)
CREATININE: 1.17 mg/dL (ref 0.61–1.24)
Calcium: 8.8 mg/dL — ABNORMAL LOW (ref 8.9–10.3)
Chloride: 107 mmol/L (ref 98–111)
GFR calc Af Amer: >60 mL/min (ref >60)
GFR calc non Af Amer: >60 mL/min (ref >60)
Glucose, Bld: 150 mg/dL — ABNORMAL HIGH (ref 70–99)
Potassium: 3.7 mmol/L (ref 3.5–5.1)
Sodium: 141 mmol/L (ref 135–145)

## 2018-05-01 MED ORDER — PREDNISONE 20 MG PO TABS: 60.0000 mg | ORAL_TABLET | Freq: Every day | ORAL | 0 refills | Status: AC

## 2018-05-01 MED ORDER — IPRATROPIUM-ALBUTEROL 0.5-2.5 (3) MG/3ML IN SOLN
3.0000 mL | Freq: Once | RESPIRATORY_TRACT | Status: AC
  Administered 2018-05-01: 3 mL via RESPIRATORY_TRACT
  Filled 2018-05-01: qty 3

## 2018-05-01 MED ORDER — METHYLPREDNISOLONE SODIUM SUCC 125 MG IJ SOLR
125.0000 mg | Freq: Once | INTRAMUSCULAR | Status: AC
  Administered 2018-05-01: 125 mg via INTRAVENOUS
  Filled 2018-05-01: qty 2

## 2018-05-01 MED ORDER — ALBUTEROL SULFATE (2.5 MG/3ML) 0.083% IN NEBU
10.0000 mg | INHALATION_SOLUTION | Freq: Once | RESPIRATORY_TRACT | Status: AC
  Administered 2018-05-02: 10 mg via RESPIRATORY_TRACT
  Filled 2018-05-01: qty 12

## 2018-05-01 NOTE — Discharge Instructions (Addendum)
Results for orders placed or performed during the hospital encounter of 05/01/18  CBC with Differential/Platelet  Result Value Ref Range   WBC 10.4 4.0 - 10.5 K/uL   RBC 4.48 4.22 - 5.81 MIL/uL   Hemoglobin 12.8 (L) 13.0 - 17.0 g/dL   HCT 16.1 09.6 - 04.5 %   MCV 87.3 80.0 - 100.0 fL   MCH 28.6 26.0 - 34.0 pg   MCHC 32.7 30.0 - 36.0 g/dL   RDW 40.9 81.1 - 91.4 %   Platelets 239 150 - 400 K/uL   nRBC 0.0 0.0 - 0.2 %   Neutrophils Relative % 63 %   Neutro Abs 6.6 1.7 - 7.7 K/uL   Lymphocytes Relative 23 %   Lymphs Abs 2.4 0.7 - 4.0 K/uL   Monocytes Relative 10 %   Monocytes Absolute 1.0 0.1 - 1.0 K/uL   Eosinophils Relative 3 %   Eosinophils Absolute 0.3 0.0 - 0.5 K/uL   Basophils Relative 1 %   Basophils Absolute 0.1 0.0 - 0.1 K/uL   Immature Granulocytes 0 %   Abs Immature Granulocytes 0.04 0.00 - 0.07 K/uL  Basic metabolic panel  Result Value Ref Range   Sodium 141 135 - 145 mmol/L   Potassium 3.7 3.5 - 5.1 mmol/L   Chloride 107 98 - 111 mmol/L   CO2 25 22 - 32 mmol/L   Glucose, Bld 150 (H) 70 - 99 mg/dL   BUN 14 8 - 23 mg/dL   Creatinine, Ser 7.82 0.61 - 1.24 mg/dL   Calcium 8.8 (L) 8.9 - 10.3 mg/dL   GFR calc non Af Amer >60 >60 mL/min   GFR calc Af Amer >60 >60 mL/min   Anion gap 9 5 - 15  Troponin I - ONCE - STAT  Result Value Ref Range   Troponin I <0.03 <0.03 ng/mL   Dg Chest 2 View  Result Date: 05/01/2018 CLINICAL DATA:  Shortness of breath EXAM: CHEST - 2 VIEW COMPARISON:  06/11/2017 FINDINGS: Severe chronic interstitial lung disease/fibrosis. Heart is normal size. No definite acute airspace opacity or effusion. No acute bony abnormality. IMPRESSION: Severe chronic interstitial lung disease/fibrosis. No definite acute process. Electronically Signed   By: Charlett Nose M.D.   On: 05/01/2018 22:25   US Venous Img Lower Bilateral  Result Date: 05/02/2018 CLINICAL DATA:  Bilateral chronic leg pain EXAM: BILATERAL LOWER EXTREMITY VENOUS DOPPLER ULTRASOUND  TECHNIQUE: Gray-scale sonography with graded compression, as well as color Doppler and duplex ultrasound were performed to evaluate the lower extremity deep venous systems from the level of the common femoral vein and including the common femoral, femoral, profunda femoral, popliteal and calf veins including the posterior tibial, peroneal and gastrocnemius veins when visible. The superficial great saphenous vein was also interrogated. Spectral Doppler was utilized to evaluate flow at rest and with distal augmentation maneuvers in the common femoral, femoral and popliteal veins. COMPARISON:  None. FINDINGS: RIGHT LOWER EXTREMITY Common Femoral Vein: No evidence of thrombus. Normal compressibility, respiratory phasicity and response to augmentation. Saphenofemoral Junction: No evidence of thrombus. Normal compressibility and flow on color Doppler imaging. Profunda Femoral Vein: No evidence of thrombus. Normal compressibility and flow on color Doppler imaging. Femoral Vein: No evidence of thrombus. Normal compressibility, respiratory phasicity and response to augmentation. Popliteal Vein: No evidence of thrombus. Normal compressibility, respiratory phasicity and response to augmentation. Calf Veins: No evidence of thrombus. Normal compressibility and flow on color Doppler imaging. Superficial Great Saphenous Vein: No evidence of thrombus. Normal compressibility. Venous  Reflux:  None. Other Findings:  None. LEFT LOWER EXTREMITY Common Femoral Vein: No evidence of thrombus. Normal compressibility, respiratory phasicity and response to augmentation. Saphenofemoral Junction: No evidence of thrombus. Normal compressibility and flow on color Doppler imaging. Profunda Femoral Vein: No evidence of thrombus. Normal compressibility and flow on color Doppler imaging. Femoral Vein: No evidence of thrombus. Normal compressibility, respiratory phasicity and response to augmentation. Popliteal Vein: No evidence of thrombus. Normal  compressibility, respiratory phasicity and response to augmentation. Calf Veins: No evidence of thrombus. Normal compressibility and flow on color Doppler imaging. Superficial Great Saphenous Vein: No evidence of thrombus. Normal compressibility. Venous Reflux:  None. Other Findings:  None. IMPRESSION: No evidence of deep venous thrombosis. Electronically Signed   By: Charlett Nose M.D.   On: 05/02/2018 01:03

## 2018-05-01 NOTE — Progress Notes (Signed)
Subjective:    Patient ID: Shane Reid., male    DOB: April 07, 1946, 72 y.o.   MRN: [id number] Chief Complaint  Patient presents with  . Follow-up    ultrasound follow up    HPI  Shane Reid. is a 72 y.o. male that presents for evaluation of leg pain that has been bothering him for several years at this point.  He states that the pain is consistent in nature.  He states that it radiates from his lower left extremity near the area of the ankle up towards his buttocks and hip area.  He states that the pain in his buttocks and hip area is worse when he lays down.  There are no precipitating events to this pain.  He denies any ulceration or infection.  He previously went to the emergency room where he had an x-ray and MRI of his lower back which did not reveal any issues.  He was seen by Dr. Wyn Quaker and per his history of coronary artery disease, it was prudent to evaluate vascular status.  He denies any fever, chills, nausea, vomiting.  He denies any traditional claudication-like symptoms.  He denies any chest pain or shortness of breath.  He underwent bilateral ABIs with an ABI of 1.15 on his right lower extremity and ABI 1.23 on his left.  He also underwent a left lower extremity arterial duplex which revealed triphasic waveforms all the way throughout his lower extremity.  Past Medical History:  Diagnosis Date  . Anxiety   . Asthma   . Bradycardia   . CHF (congestive heart failure) (HCC)   . Chronic Chest Pain   . COPD (chronic obstructive pulmonary disease) (HCC)   . Coronary artery disease    a. 2008 s/p PCI to LAD;  b. 2013 Cath: patent stent->Med Rx;  c. 11/2013 MV: EF 67%, no ischemia/infarct.  . Depression   . GERD (gastroesophageal reflux disease)   . Heart murmur    a. 02/2010 Echo: EF 55-60%, no rwma, Gr 2 DD, triv MR, mildly dil LA, nl RV.  [name] Hypercholesteremia   . Hypertension   . Hypertensive heart disease   . Idiopathic pulmonary fibrosis (HCC)    a. Seen by  Dr. Dema Severin 01/2015 and Rx Esbriet - pt cannot afford.  . Other social stressor    a. wife with mental illness    Past Surgical History:  Procedure Laterality Date  . CARDIAC CATHETERIZATION    . CORONARY ANGIOPLASTY     stents times 4  . Esophagus stretch    . EYE SURGERY     Bilateral cataracts  . LUNG BIOPSY    . NASAL ENDOSCOPY N/A 12/21/2016   Procedure: NASAL ENDOSCOPY;  Surgeon: Vernie Murders, MD;  Location: [facility];  Service: ENT;  Laterality: N/A;  . TESTICLE SURGERY    . VIDEO ASSISTED THORACOSCOPY (VATS)/THOROCOTOMY Right 11/18/2014   Procedure: VIDEO ASSISTED THORACOSCOPY (VATS)/ wedge resection biopsy;  Surgeon: Hulda Marin, MD;  Location: [facility];  Service: General;  Laterality: Right;    Social History   Socioeconomic History  . Marital status: Divorced    Spouse name: Not on file  . Number of children: Not on file  . Years of education: Not on file  . Highest education level: Not on file  Occupational History  . Not on file  Social Needs  . Financial resource strain: Not on file  . Food insecurity:    Worry: Not on file  Inability: Not on file  . Transportation needs:    Medical: Not on file    Non-medical: Not on file  Tobacco Use  . Smoking status: Former Smoker    Packs/day: 0.50    Years: 1.00    Pack years: 0.50    Types: Cigarettes    Last attempt to quit: 05/09/1968    Years since quitting: 50.0  . Smokeless tobacco: Never Used  Substance and Sexual Activity  . Alcohol use: No  . Drug use: No  . Sexual activity: Not on file  Lifestyle  . Physical activity:    Days per week: Not on file    Minutes per session: Not on file  . Stress: Not on file  Relationships  . Social connections:    Talks on phone: Not on file    Gets together: Not on file    Attends religious service: Not on file    Active member of club or organization: Not on file    Attends meetings of clubs or organizations: Not on file    Relationship status:  Not on file  . Intimate partner violence:    Fear of current or ex partner: Not on file    Emotionally abused: Not on file    Physically abused: Not on file    Forced sexual activity: Not on file  Other Topics Concern  . Not on file  Social History Narrative   Divorced but lives with ex-wife   On SSI disability, wife with mental illness       Family History  Problem Relation Age of Onset  . Coronary artery disease Mother        s/p CABG  . Heart failure Mother   . Heart disease Other   . Depression Other     Allergies  Allergen Reactions  . Metoprolol Succinate Other (See Comments)    Reaction:  Unknown   . Niacin Other (See Comments)    Reaction:  Unknown   . Sertraline Hcl Other (See Comments)    Reaction:  Unknown      Review of Systems   Review of Systems: Negative Unless Checked Constitutional: [] Weight loss  [] Fever  [] Chills Cardiac: [] Chest pain   []  Atrial Fibrillation  [] Palpitations   [] Shortness of breath when laying flat   [] Shortness of breath with exertion. Vascular:  [x] Pain in legs with walking   [x] Pain in legs with standing  [] History of DVT   [] Phlebitis   [] Swelling in legs   [] Varicose veins   [] Non-healing ulcers Pulmonary:   [] Uses home oxygen   [] Productive cough   [] Hemoptysis   [] Wheeze  [] COPD   [] Asthma Neurologic:  [] Dizziness   [] Seizures   [] History of stroke   [] History of TIA  [] Aphasia   [] Vissual changes   [] Weakness or numbness in arm   [] Weakness or numbness in leg Musculoskeletal:   [] Joint swelling   [] Joint pain   [] Low back pain  []  History of Knee Replacement Hematologic:  [] Easy bruising  [] Easy bleeding   [] Hypercoagulable state   [] Anemic Gastrointestinal:  [] Diarrhea   [] Vomiting  [] Gastroesophageal reflux/heartburn   [] Difficulty swallowing. Genitourinary:  [] Chronic kidney disease   [] Difficult urination  [] Anuric   [] Blood in urine Skin:  [] Rashes   [] Ulcers  Psychological:  [] History of anxiety   []  History of major  depression  []  Memory Difficulties     Objective:   Physical Exam  BP 127/67 (BP Location: Right Arm)   Pulse 71  Resp 16   Ht 5\' 9"  (1.753 m)   Wt 163 lb 6.4 oz (74.1 kg)   BMI 24.13 kg/m   Gen: WD/WN, NAD Head: [place]/AT, No temporalis wasting.  Ear/Nose/Throat: Hearing grossly intact, nares w/o erythema or drainage Eyes: PER, EOMI, sclera nonicteric.  Neck: Supple, no masses.  No JVD.  Pulmonary:  Good air movement, no use of accessory muscles.  Cardiac: RRR Vascular:  Vessel Right Left  Radial Palpable Palpable  Dorsalis Pedis Palpable Palpable  Posterior Tibial Palpable Palpable   Gastrointestinal: soft, non-distended. No guarding/no peritoneal signs.  Musculoskeletal: M/S 5/5 throughout.  No deformity or atrophy.  Neurologic: Pain and light touch intact in extremities.  Symmetrical.  Speech is fluent. Motor exam as listed above. Psychiatric: Judgment intact, Mood & affect appropriate for pt's clinical situation. Dermatologic: No Venous rashes. No Ulcers Noted.  No changes consistent with cellulitis. Lymph : No Cervical lymphadenopathy, no lichenification or skin changes of chronic lymphedema.      Assessment & Plan:   1. Pain of left lower extremity He underwent bilateral ABIs with an ABI of 1.15 on his right lower extremity and ABI 1.23 on his left.  He also underwent a left lower extremity arterial duplex which revealed triphasic waveforms all the way throughout his lower extremity.  Recommend:  I do not find evidence of life style limiting vascular disease. The patient specifically denies life style limitation.  Previous noninvasive studies including ABI's of the legs do not identify critical vascular problems.  The patient should continue walking and begin a more formal exercise program. The patient should continue his antiplatelet therapy and aggressive treatment of the lipid abnormalities.  The patient should begin wearing graduated compression socks 15-20  mmHg strength to control her mild edema.  Patient will follow-up with me on a PRN basis  The patient's symptoms are somewhat consistent with a neuropathic type pain.  He states that he has had an MRI and x-ray of his lower back with no evidence of degenerative disk disease.   - Ambulatory referral to Neurology  2. Hyperlipidemia, unspecified hyperlipidemia type Continue statin as ordered and reviewed, no changes at this time   3. Essential hypertension Continue antihypertensive medications as already ordered, these medications have been reviewed and there are no changes at this time.    Current Outpatient Medications on File Prior to Visit  Medication Sig Dispense Refill  . albuterol (PROVENTIL) (2.5 MG/3ML) 0.083% nebulizer solution Take 3 mLs (2.5 mg total) by nebulization every 6 (six) hours as needed. 75 mL 11  . amLODipine-benazepril (LOTREL) 5-10 MG capsule Take 1 capsule by mouth daily. 90 capsule 0  . aspirin EC 81 MG tablet Take 81 mg by mouth every morning.    . gabapentin (NEURONTIN) 300 MG capsule TAKE 1 TAB BY MOUTH 3 TIMES A DAY FOR NERVE PAIN  3  . guaiFENesin (ROBITUSSIN) 100 MG/5ML SOLN Take 5 mLs (100 mg total) by mouth every 4 (four) hours as needed for cough or to loosen phlegm. 120 mL 0  . Multiple Vitamins-Minerals (CENTRUM SILVER PO) Take by mouth daily.    . nitroGLYCERIN (NITROSTAT) 0.4 MG SL tablet Place 1 tablet (0.4 mg total) under the tongue every 5 (five) minutes as needed. 25 tablet 0  . rosuvastatin (CRESTOR) 10 MG tablet Take 1 tablet (10 mg total) by mouth daily. 90 tablet 0  . traMADol (ULTRAM) 50 MG tablet Take 1 tablet (50 mg total) by mouth every 6 (six) hours as needed. (Patient  not taking: Reported on 04/09/2018) 15 tablet 0   No current facility-administered medications on file prior to visit.     There are no Patient Instructions on file for this visit. Return if symptoms worsen or fail to improve.   Georgiana Spinner, NP  This note was  completed with Office manager.  Any errors are purely unintentional.

## 2018-05-01 NOTE — ED Triage Notes (Signed)
Patient to ER for c/o shortness of breath. Patient has h/o pulmonary fibrosis. Patient states for last week he has been more short of breath than usual, and has been coughing up white sputum. Patient denies any fevers. Patient able to speak in complete sentences, but seems more short of breath with walking to triage room/stat desk.

## 2018-05-01 NOTE — ED Provider Notes (Signed)
[facility] Emergency Department Provider Note  ____________________________________________  Time seen: Approximately 11:01 PM  I have reviewed the triage vital signs and the nursing notes.   HISTORY  Chief Complaint Shortness of Breath   HPI Shane Reid. is a 72 y.o. male with h/o pulmonary fibrosis, CHF, COPD, CAD who presents for evaluation of SOB. Patient reports progressively worsening SOB x 1 week. Has had a cough productive of white sputum. NO fever or chills. Has not been using his inhalers. Complaining of chest tightness associated with it. Also complaining of L leg pain for 1 year, the pain is sharp and intermittent involving the entire leg. No swelling. NO prior h/o DVT or PE, no hemoptysis, recent travel or immobilization.   Past Medical History:  Diagnosis Date  . Anxiety   . Asthma   . Bradycardia   . CHF (congestive heart failure) (HCC)   . Chronic Chest Pain   . COPD (chronic obstructive pulmonary disease) (HCC)   . Coronary artery disease    a. 2008 s/p PCI to LAD;  b. 2013 Cath: patent stent->Med Rx;  c. 11/2013 MV: EF 67%, no ischemia/infarct.  . Depression   . GERD (gastroesophageal reflux disease)   . Heart murmur    a. 02/2010 Echo: EF 55-60%, no rwma, Gr 2 DD, triv MR, mildly dil LA, nl RV.  [name] Hypercholesteremia   . Hypertension   . Hypertensive heart disease   . Idiopathic pulmonary fibrosis (HCC)    a. Seen by Dr. Dema Severin 01/2015 and Rx Esbriet - pt cannot afford.  . Other social stressor    a. wife with mental illness    Patient Active Problem List   Diagnosis Date Noted  . Hypertension 04/09/2018  . Pain in limb 04/09/2018  . 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 Benign localized hyperplasia of prostate with urinary obstruction 03/01/2017  . Incomplete emptying of bladder 03/01/2017  . Scrotal pain 09/11/2016  . Oropharyngeal dysphagia 09/21/2015  . Hypertensive heart disease   . Chronic Chest Pain   . Ischemic heart  disease   . Hypercholesteremia   . 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 IPF (idiopathic pulmonary fibrosis) (HCC) 12/23/2014  . Pre-operative cardiovascular examination 11/05/2014  . ILD (interstitial lung disease) (HCC) 09/04/2014  . Interstitial lung disease (HCC) 06/22/2014  . Upper respiratory infection 12/03/2013  . 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 CAROTID BRUIT, LEFT 05/12/2010  . Hyperlipidemia 07/01/2009  . CAD, NATIVE VESSEL 07/01/2009  . SHORTNESS OF BREATH 07/01/2009  . Atypical chest pain 07/01/2009  . Family history of early CAD 01/23/2007  . Anxiety and depression 02/02/2005    Past Surgical History:  Procedure Laterality Date  . CARDIAC CATHETERIZATION    . CORONARY ANGIOPLASTY     stents times 4  . Esophagus stretch    . EYE SURGERY     Bilateral cataracts  . LUNG BIOPSY    . NASAL ENDOSCOPY N/A 12/21/2016   Procedure: NASAL ENDOSCOPY;  Surgeon: Vernie Murders, MD;  Location: [facility];  Service: ENT;  Laterality: N/A;  . TESTICLE SURGERY    . VIDEO ASSISTED THORACOSCOPY (VATS)/THOROCOTOMY Right 11/18/2014   Procedure: VIDEO ASSISTED THORACOSCOPY (VATS)/ wedge resection biopsy;  Surgeon: Hulda Marin, MD;  Location: [facility];  Service: General;  Laterality: Right;    Prior to Admission medications   Medication Sig Start Date End Date Taking? Authorizing Provider  albuterol (PROVENTIL) (2.5 MG/3ML) 0.083% nebulizer solution Take 3 mLs (  2.5 mg total) by nebulization every 6 (six) hours as needed. 05/02/12   Antonieta Iba, MD  amLODipine-benazepril (LOTREL) 5-10 MG capsule Take 1 capsule by mouth daily. 02/14/18   Antonieta Iba, MD  aspirin EC 81 MG tablet Take 81 mg by mouth every morning. 08/06/12   [provider]  gabapentin (NEURONTIN) 300 MG capsule TAKE 1 TAB BY MOUTH 3 TIMES A DAY FOR NERVE PAIN 03/07/18   [provider]  guaiFENesin (ROBITUSSIN) 100 MG/5ML SOLN Take 5 mLs (100 mg  total) by mouth every 4 (four) hours as needed for cough or to loosen phlegm. 11/15/16   Sharman Cheek, MD  Multiple Vitamins-Minerals (CENTRUM SILVER PO) Take by mouth daily.    [provider]  nitroGLYCERIN (NITROSTAT) 0.4 MG SL tablet Place 1 tablet (0.4 mg total) under the tongue every 5 (five) minutes as needed. 07/30/17   Antonieta Iba, MD  predniSONE (DELTASONE) 20 MG tablet Take 3 tablets (60 mg total) by mouth daily for 4 days. 05/01/18 05/05/18  Nita Sickle, MD  rosuvastatin (CRESTOR) 10 MG tablet Take 1 tablet (10 mg total) by mouth daily. 02/14/18   Antonieta Iba, MD  traMADol (ULTRAM) 50 MG tablet Take 1 tablet (50 mg total) by mouth every 6 (six) hours as needed. Patient not taking: Reported on 04/09/2018 03/24/18   Faythe Ghee, PA-C    Allergies Metoprolol succinate; Niacin; and Sertraline hcl  Family History  Problem Relation Age of Onset  . Coronary artery disease Mother        s/p CABG  . Heart failure Mother   . Heart disease Other   . Depression Other     Social History Social History   Tobacco Use  . Smoking status: Former Smoker    Packs/day: 0.50    Years: 1.00    Pack years: 0.50    Types: Cigarettes    Last attempt to quit: 05/09/1968    Years since quitting: 50.0  . Smokeless tobacco: Never Used  Substance Use Topics  . Alcohol use: No  . Drug use: No    Review of Systems  Constitutional: Negative for fever. Eyes: Negative for visual changes. ENT: Negative for sore throat. Neck: No neck pain  Cardiovascular: Negative for chest pain. + chest tightness Respiratory: + shortness of breath, cough Gastrointestinal: Negative for abdominal pain, vomiting or diarrhea. Genitourinary: Negative for dysuria. Musculoskeletal: Negative for back pain. Skin: Negative for rash. Neurological: Negative for headaches, weakness or numbness. Psych: No SI or HI  ____________________________________________   PHYSICAL EXAM:  VITAL  SIGNS: ED Triage Vitals  Enc Vitals Group     BP 05/01/18 2202 131/78     Pulse Rate 05/01/18 2202 73     Resp 05/01/18 2202 (!) 24     Temp 05/01/18 2202 98.1 F (36.7 C)     Temp Source 05/01/18 2202 Oral     SpO2 05/01/18 2202 95 %     Weight 05/01/18 2201 163 lb 5.8 oz (74.1 kg)     Height 05/01/18 2201 5\' 9"  (1.753 m)     Head Circumference --      Peak Flow --      Pain Score 05/01/18 2159 3     Pain Loc --      Pain Edu? --      Excl. in GC? --     Constitutional: Alert and oriented. Well appearing and in no apparent distress. HEENT:  Head: Normocephalic and atraumatic.         Eyes: Conjunctivae are normal. Sclera is non-icteric.       Mouth/Throat: Mucous membranes are moist.       Neck: Supple with no signs of meningismus. Cardiovascular: Regular rate and rhythm. No murmurs, gallops, or rubs. 2+ symmetrical distal pulses are present in all extremities. No JVD. Respiratory: Mild increased WOB, sating low 90s on RA, good air movement with diffuse crackles, no wheezing. Gastrointestinal: Soft, non tender, and non distended with positive bowel sounds. No rebound or guarding. Musculoskeletal: Nontender with normal range of motion in all extremities. No edema, cyanosis, or erythema of extremities. Neurologic: Normal speech and language. Face is symmetric. Moving all extremities. No gross focal neurologic deficits are appreciated. Skin: Skin is warm, dry and intact. No rash noted. Psychiatric: Mood and affect are normal. Speech and behavior are normal.  ____________________________________________   LABS (all labs ordered are listed, but only abnormal results are displayed)  Labs Reviewed  CBC WITH DIFFERENTIAL/PLATELET - Abnormal; Notable for the following components:      Result Value   Hemoglobin 12.8 (*)    All other components within normal limits  BASIC METABOLIC PANEL - Abnormal; Notable for the following components:   Glucose, Bld 150 (*)    Calcium 8.8  (*)    All other components within normal limits  TROPONIN I   ____________________________________________  EKG  ED ECG REPORT I, Nita Sickle, the attending physician, personally viewed and interpreted this ECG.   NSR, rate 75, normal intervals, normal axis, no STE or depression. Unchanged from prior ____________________________________________  RADIOLOGY  I have personally reviewed the images performed during this visit and I agree with the Radiologist's read.   Interpretation by Radiologist:  Dg Chest 2 View  Result Date: 05/01/2018 CLINICAL DATA:  Shortness of breath EXAM: CHEST - 2 VIEW COMPARISON:  06/11/2017 FINDINGS: Severe chronic interstitial lung disease/fibrosis. Heart is normal size. No definite acute airspace opacity or effusion. No acute bony abnormality. IMPRESSION: Severe chronic interstitial lung disease/fibrosis. No definite acute process. Electronically Signed   By: Charlett Nose M.D.   On: 05/01/2018 22:25     ____________________________________________   PROCEDURES  Procedure(s) performed: None Procedures Critical Care performed:  None ____________________________________________   INITIAL IMPRESSION / ASSESSMENT AND PLAN / ED COURSE   72 y.o. male with h/o pulmonary fibrosis, CHF, COPD, CAD who presents for evaluation of SOB x 1 week and cough. DDX: viral URI, COPD exacerbation, bronchitis, PNA, CHF exacerbation. Low suspicion for PE although patient is complaining of intermittent LLE pain for 1 year. Will pursue doppler US. Will give duonebs and prednisone. CXR negative for PNA. No fever and no leukocytosis, will hold off abx. EKG negative for ischemia. Plan to reassess after venous US. Care transferred to Dr. Lamont Snowball.      As part of my medical decision making, I reviewed the following data within the electronic MEDICAL RECORD NUMBER Nursing notes reviewed and incorporated, Labs reviewed , EKG interpreted , Old EKG reviewed, Old chart reviewed,  Radiograph reviewed , Notes from prior ED visits and [place] Controlled Substance Database    Pertinent labs & imaging results that were available during my care of the patient were reviewed by me and considered in my medical decision making (see chart for details).    ____________________________________________   FINAL CLINICAL IMPRESSION(S) / ED DIAGNOSES  Final diagnoses:  Bronchitis  Left leg pain      NEW MEDICATIONS STARTED DURING  THIS VISIT:  ED Discharge Orders         Ordered    predniSONE (DELTASONE) 20 MG tablet  Daily     05/01/18 2308           Note:  This document was prepared using Dragon voice recognition software and may include unintentional dictation errors.    Don Perking, [place], MD 05/01/18 2308

## 2018-05-02 ENCOUNTER — Emergency Department: Payer: Medicare Other

## 2018-05-02 NOTE — ED Notes (Signed)
Patient verbalized understanding of discharge instructions, no questions. Patient ambulated out of ED with steady gait in no distress.  

## 2018-05-19 ENCOUNTER — Other Ambulatory Visit: Payer: Self-pay | Admitting: Cardiovascular Disease

## 2018-05-20 ENCOUNTER — Other Ambulatory Visit: Payer: Self-pay

## 2018-05-20 ENCOUNTER — Telehealth: Payer: Self-pay | Admitting: Cardiovascular Disease

## 2018-05-20 MED ORDER — ROSUVASTATIN CALCIUM 10 MG PO TABS: 10.0000 mg | ORAL_TABLET | Freq: Every day | ORAL | 0 refills | Status: DC

## 2018-05-20 NOTE — Telephone Encounter (Signed)
Refills sent.   Requested Prescriptions   Signed Prescriptions Disp Refills  . rosuvastatin (CRESTOR) 10 MG tablet 90 tablet 0    Sig: Take 1 tablet (10 mg total) by mouth daily.    Authorizing Provider: Antonieta Iba    Ordering User: Margrett Rud   amLODipine-benazepril (LOTREL) 5-10 MG capsule 90 capsule 3 05/20/2018    Sig: TAKE 1 CAPSULE BY MOUTH EVERY DAY   Sent to pharmacy as: amLODipine-benazepril (LOTREL) 5-10 MG capsule   E-Prescribing Status: Receipt confirmed by pharmacy (05/20/2018 10:02 AM EST)   Pharmacy   [id number] - [place], [place] - [address]

## 2018-05-20 NOTE — Telephone Encounter (Signed)
°*  STAT* If patient is at the pharmacy, call can be transferred to refill team.   1. Which medications need to be refilled? (please list name of each medication and dose if known)  Amlodipine - benazepril 5-10 MG - 1 tablet daily  rousvastatin (CRESTOR) 10 MG - 1 tablet daily   2. Which pharmacy/location (including street and city if local pharmacy) is medication to be sent to? [facility] [address] in [place]   3. Do they need a 30 day or 90 day supply? 90 day

## 2018-07-10 ENCOUNTER — Other Ambulatory Visit: Payer: Self-pay

## 2018-07-10 ENCOUNTER — Emergency Department
Admission: EM | Admit: 2018-07-10 | Discharge: 2018-07-10 | Disposition: A | Discharge status: Home / Self Care | Payer: Medicare Other | Attending: Emergency Medicine | Admitting: Emergency Medicine

## 2018-07-10 ENCOUNTER — Emergency Department: Payer: Medicare Other

## 2018-07-10 ENCOUNTER — Encounter: Payer: Self-pay | Admitting: Emergency Medicine

## 2018-07-10 DIAGNOSIS — I509 Heart failure, unspecified: Secondary | ICD-10-CM | POA: Insufficient documentation

## 2018-07-10 DIAGNOSIS — J069 Acute upper respiratory infection, unspecified: Secondary | ICD-10-CM | POA: Insufficient documentation

## 2018-07-10 DIAGNOSIS — Z79899 Other long term (current) drug therapy: Secondary | ICD-10-CM | POA: Insufficient documentation

## 2018-07-10 DIAGNOSIS — R05 Cough: Secondary | ICD-10-CM | POA: Diagnosis present

## 2018-07-10 DIAGNOSIS — J449 Chronic obstructive pulmonary disease, unspecified: Secondary | ICD-10-CM | POA: Insufficient documentation

## 2018-07-10 DIAGNOSIS — Z87891 Personal history of nicotine dependence: Secondary | ICD-10-CM | POA: Diagnosis not present

## 2018-07-10 DIAGNOSIS — Z7982 Long term (current) use of aspirin: Secondary | ICD-10-CM | POA: Diagnosis not present

## 2018-07-10 DIAGNOSIS — I110 Hypertensive heart disease with heart failure: Secondary | ICD-10-CM | POA: Insufficient documentation

## 2018-07-10 DIAGNOSIS — B9789 Other viral agents as the cause of diseases classified elsewhere: Secondary | ICD-10-CM

## 2018-07-10 MED ORDER — HYDROCOD POLST-CPM POLST ER 10-8 MG/5ML PO SUER: 5.0000 mL | Freq: Two times a day (BID) | ORAL | 0 refills | Status: DC

## 2018-07-10 MED ORDER — DEXAMETHASONE 10 MG/ML FOR PEDIATRIC ORAL USE
10.0000 mg | Freq: Once | INTRAMUSCULAR | Status: AC
  Administered 2018-07-10: 10 mg via ORAL

## 2018-07-10 MED ORDER — DEXAMETHASONE SODIUM PHOSPHATE 10 MG/ML IJ SOLN
INTRAMUSCULAR | Status: AC
  Administered 2018-07-10: 10 mg via ORAL
  Filled 2018-07-10: qty 1

## 2018-07-10 MED ORDER — HYDROCOD POLST-CPM POLST ER 10-8 MG/5ML PO SUER
5.0000 mL | Freq: Once | ORAL | Status: AC
  Administered 2018-07-10: 5 mL via ORAL
  Filled 2018-07-10: qty 5

## 2018-07-10 NOTE — ED Triage Notes (Signed)
Patient ambulatory to triage with steady gait, without difficulty or distress noted, mask in place; pt reports prod cough white sputum since yesterday

## 2018-07-10 NOTE — ED Provider Notes (Signed)
[facility] Emergency Department Provider Note   ____________________________________________   First MD Initiated Contact with Patient 07/10/18 0321     (approximate)  I have reviewed the triage vital signs and the nursing notes.   HISTORY  Chief Complaint Cough    HPI Shane Reid. is a 73 y.o. male who presents to the ED from home with a chief complaint of cough.  Patient has a history of pulmonary fibrosis who states she had increasing cough yesterday.  Reports cough productive of white sputum.  Denies associated fever, chills, chest pain, abdominal pain, nausea, vomiting or diarrhea.  Denies recent travel or trauma.    Past Medical History:  Diagnosis Date  . Anxiety   . Asthma   . Bradycardia   . CHF (congestive heart failure) (HCC)   . Chronic Chest Pain   . COPD (chronic obstructive pulmonary disease) (HCC)   . Coronary artery disease    a. 2008 s/p PCI to LAD;  b. 2013 Cath: patent stent->Med Rx;  c. 11/2013 MV: EF 67%, no ischemia/infarct.  . Depression   . GERD (gastroesophageal reflux disease)   . Heart murmur    a. 02/2010 Echo: EF 55-60%, no rwma, Gr 2 DD, triv MR, mildly dil LA, nl RV.  [name] Hypercholesteremia   . Hypertension   . Hypertensive heart disease   . Idiopathic pulmonary fibrosis (HCC)    a. Seen by Dr. Dema Severin 01/2015 and Rx Esbriet - pt cannot afford.  . Other social stressor    a. wife with mental illness    Patient Active Problem List   Diagnosis Date Noted  . Hypertension 04/09/2018  . Pain in limb 04/09/2018  . 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 Benign localized hyperplasia of prostate with urinary obstruction 03/01/2017  . Incomplete emptying of bladder 03/01/2017  . Scrotal pain 09/11/2016  . Oropharyngeal dysphagia 09/21/2015  . Hypertensive heart disease   . Chronic Chest Pain   . Ischemic heart disease   . Hypercholesteremia   . 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 IPF (idiopathic pulmonary fibrosis) (HCC) 12/23/2014  . Pre-operative cardiovascular examination 11/05/2014  . ILD (interstitial lung disease) (HCC) 09/04/2014  . Interstitial lung disease (HCC) 06/22/2014  . Upper respiratory infection 12/03/2013  . 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 CAROTID BRUIT, LEFT 05/12/2010  . Hyperlipidemia 07/01/2009  . CAD, NATIVE VESSEL 07/01/2009  . SHORTNESS OF BREATH 07/01/2009  . Atypical chest pain 07/01/2009  . Family history of early CAD 01/23/2007  . Anxiety and depression 02/02/2005    Past Surgical History:  Procedure Laterality Date  . CARDIAC CATHETERIZATION    . CORONARY ANGIOPLASTY     stents times 4  . Esophagus stretch    . EYE SURGERY     Bilateral cataracts  . LUNG BIOPSY    . NASAL ENDOSCOPY N/A 12/21/2016   Procedure: NASAL ENDOSCOPY;  Surgeon: Vernie Murders, MD;  Location: [facility];  Service: ENT;  Laterality: N/A;  . TESTICLE SURGERY    . VIDEO ASSISTED THORACOSCOPY (VATS)/THOROCOTOMY Right 11/18/2014   Procedure: VIDEO ASSISTED THORACOSCOPY (VATS)/ wedge resection biopsy;  Surgeon: Hulda Marin, MD;  Location: [facility];  Service: General;  Laterality: Right;    Prior to Admission medications   Medication Sig Start Date End Date Taking? Authorizing Provider  albuterol (PROVENTIL) (2.5 MG/3ML) 0.083% nebulizer solution Take 3 mLs (2.5 mg total) by nebulization every 6 (six) hours as needed. 05/02/12   Gollan,  Tollie Pizzaimothy J, MD  amLODipine-benazepril (LOTREL) 5-10 MG capsule TAKE 1 CAPSULE BY MOUTH EVERY DAY 05/20/18   Antonieta IbaGollan, [name], MD  aspirin EC 81 MG tablet Take 81 mg by mouth every morning. 08/06/12   [provider]  chlorpheniramine-HYDROcodone (TUSSIONEX PENNKINETIC ER) 10-8 MG/5ML SUER Take 5 mLs by mouth 2 (two) times daily. 07/10/18   Irean HongSung, Horrace Hanak J, MD  gabapentin (NEURONTIN) 300 MG capsule TAKE 1 TAB BY MOUTH 3 TIMES A DAY FOR NERVE PAIN 03/07/18   [provider]  guaiFENesin  (ROBITUSSIN) 100 MG/5ML SOLN Take 5 mLs (100 mg total) by mouth every 4 (four) hours as needed for cough or to loosen phlegm. 11/15/16   Sharman CheekStafford, Phillip, MD  Multiple Vitamins-Minerals (CENTRUM SILVER PO) Take by mouth daily.    [provider]  nitroGLYCERIN (NITROSTAT) 0.4 MG SL tablet Place 1 tablet (0.4 mg total) under the tongue every 5 (five) minutes as needed. 07/30/17   Antonieta IbaGollan, [name], MD  rosuvastatin (CRESTOR) 10 MG tablet Take 1 tablet (10 mg total) by mouth daily. 05/20/18   Antonieta IbaGollan, [name], MD  traMADol (ULTRAM) 50 MG tablet Take 1 tablet (50 mg total) by mouth every 6 (six) hours as needed. Patient not taking: Reported on 04/09/2018 03/24/18   Faythe GheeFisher, Susan W, PA-C    Allergies Metoprolol succinate; Niacin; and Sertraline hcl  Family History  Problem Relation Age of Onset  . Coronary artery disease Mother        s/p CABG  . Heart failure Mother   . Heart disease Other   . Depression Other     Social History Social History   Tobacco Use  . Smoking status: Former Smoker    Packs/day: 0.50    Years: 1.00    Pack years: 0.50    Types: Cigarettes    Last attempt to quit: 05/09/1968    Years since quitting: 50.2  . Smokeless tobacco: Never Used  Substance Use Topics  . Alcohol use: No  . Drug use: No    Review of Systems  Constitutional: No fever/chills Eyes: No visual changes. ENT: No sore throat. Cardiovascular: Denies chest pain. Respiratory: Positive for productive cough.  Denies shortness of breath. Gastrointestinal: No abdominal pain.  No nausea, no vomiting.  No diarrhea.  No constipation. Genitourinary: Negative for dysuria. Musculoskeletal: Negative for back pain. Skin: Negative for rash. Neurological: Negative for headaches, focal weakness or numbness.   ____________________________________________   PHYSICAL EXAM:  VITAL SIGNS: ED Triage Vitals  Enc Vitals Group     BP 07/10/18 0142 (!) 141/69     Pulse Rate 07/10/18 0142 84       Resp 07/10/18 0142 18     Temp 07/10/18 0142 98 F (36.7 C)     Temp Source 07/10/18 0142 Oral     SpO2 07/10/18 0142 95 %     Weight 07/10/18 0142 161 lb (73 kg)     Height 07/10/18 0142 5\' 9"  (1.753 m)     Head Circumference --      Peak Flow --      Pain Score 07/10/18 0141 0     Pain Loc --      Pain Edu? --      Excl. in GC? --     Constitutional: Alert and oriented. Well appearing and in no acute distress. Eyes: Conjunctivae are normal. PERRL. EOMI. Head: Atraumatic. Nose: No congestion/rhinnorhea. Mouth/Throat: Mucous membranes are moist.  Oropharynx non-erythematous. Neck: No stridor.   Cardiovascular:  Normal rate, regular rhythm. Grossly normal heart sounds.  Good peripheral circulation. Respiratory: Normal respiratory effort.  No retractions. Lungs CTAB. Gastrointestinal: Soft and nontender. No distention. No abdominal bruits. No CVA tenderness. Musculoskeletal: No lower extremity tenderness nor edema.  No joint effusions. Neurologic:  Normal speech and language. No gross focal neurologic deficits are appreciated. No gait instability. Skin:  Skin is warm, dry and intact. No rash noted. Psychiatric: Mood and affect are normal. Speech and behavior are normal.  ____________________________________________   LABS (all labs ordered are listed, but only abnormal results are displayed)  Labs Reviewed - No data to display ____________________________________________  EKG  None ____________________________________________  RADIOLOGY  ED MD interpretation: No pneumonia  Official radiology report(s): Dg Chest 2 View  Result Date: 07/10/2018 CLINICAL DATA:  Productive cough for 2 days EXAM: CHEST - 2 VIEW COMPARISON:  05/01/2018 FINDINGS: Cardiac shadow is stable. The lungs again demonstrate diffuse fibrotic changes bilaterally worst in the right upper lobe. No focal infiltrate or sizable effusion is seen. No acute bony abnormality is noted. IMPRESSION: Chronic  fibrotic changes without definitive acute infiltrate. Electronically Signed   By: Alcide Clever M.D.   On: 07/10/2018 02:05    ____________________________________________   PROCEDURES  Procedure(s) performed: None  Procedures  Critical Care performed: No  ____________________________________________   INITIAL IMPRESSION / ASSESSMENT AND PLAN / ED COURSE  As part of my medical decision making, I reviewed the following data within the electronic MEDICAL RECORD NUMBER Nursing notes reviewed and incorporated, Radiograph reviewed and Notes from prior ED visits    73 year old male with pulmonary fibrosis who presents with productive cough without fever, tachycardia or hypoxia.  States he heard croupy sounding cough at home.  Will administer oral Decadron, Tussionex and patient will follow-up closely with his PCP.  Strict return precautions given.  Patient verbalizes understanding agrees with plan of care.      ____________________________________________   FINAL CLINICAL IMPRESSION(S) / ED DIAGNOSES  Final diagnoses:  Viral URI with cough     ED Discharge Orders         Ordered    chlorpheniramine-HYDROcodone (TUSSIONEX PENNKINETIC ER) 10-8 MG/5ML SUER  2 times daily     07/10/18 0326           Note:  This document was prepared using Dragon voice recognition software and may include unintentional dictation errors.   Irean Hong, MD 07/10/18 [phone]

## 2018-07-10 NOTE — Discharge Instructions (Signed)
1. You may take cough medicine as needed (Tussionex). °2. Return to the ER for worsening symptoms, persistent vomiting, difficulty breathing or other concerns. °

## 2018-07-13 NOTE — Progress Notes (Signed)
[facility] Neurology [facility] Clinic Note - Initial Visit   Date: 07/15/18  Shane Reid. MRN: [id number] DOB: Aug 27, 1945   Dear Sheppard Plumber, NP:  Thank you for your kind referral of Shane Reid. for consultation of left leg pain. Although his history is well known to you, please allow [place] to reiterate it for the purpose of our medical record. The patient was accompanied to the clinic by self.   History of Present Illness: Shane Nong. is a 73 y.o. left-handed Caucasian male with CHF, CAD s/p PCI, hypertension, idiopathic pulmonary fibrosis, hyperlipidemia, GERD, depression presenting for evaluation of left leg pain.    Starting around 2017, he began having pain over the left hip and ankle.  He describes the pain as throbbing with associated numbness and tingling.  Nothing alleviates or exacerbates his pain.  He reports that it is constant.  He does not have any weakness, imbalance, no history of falls.  He has tried gabapentin 300 mg 3 times daily, with no benefit.  He also has chronic low back pain.  Given his cardiac history, he saw vascular surgery for possible peripheral arterial disease and hand normal ABI and duplex.  He is referred for further neurological evaluation.  He has not done physical therapy for low back pain.    Out-side paper records, electronic medical record, and images have been reviewed where available and summarized as:  CT lumbar spine 03/24/2018: 1. No acute fracture or malalignment of the lumbar spine. 2. Stable mild lumbar spondylosis. No high-grade bony foraminal or canal stenosis. 3.  Aortic Atherosclerosis (ICD10-I70.0). 4. Sigmoid diverticulosis.   Past Medical History:  Diagnosis Date  . Anxiety   . Asthma   . Bradycardia   . CHF (congestive heart failure) (HCC)   . Chronic Chest Pain   . COPD (chronic obstructive pulmonary disease) (HCC)   . Coronary artery disease    a. 2008 s/p PCI to LAD;  b. 2013 Cath:  patent stent->Med Rx;  c. 11/2013 MV: EF 67%, no ischemia/infarct.  . Depression   . GERD (gastroesophageal reflux disease)   . Heart murmur    a. 02/2010 Echo: EF 55-60%, no rwma, Gr 2 DD, triv MR, mildly dil LA, nl RV.  [name] Hypercholesteremia   . Hypertension   . Hypertensive heart disease   . Idiopathic pulmonary fibrosis (HCC)    a. Seen by Dr. Dema Severin 01/2015 and Rx Esbriet - pt cannot afford.  . Other social stressor    a. wife with mental illness    Past Surgical History:  Procedure Laterality Date  . CARDIAC CATHETERIZATION    . CORONARY ANGIOPLASTY     stents times 4  . Esophagus stretch    . EYE SURGERY     Bilateral cataracts  . LUNG BIOPSY    . NASAL ENDOSCOPY N/A 12/21/2016   Procedure: NASAL ENDOSCOPY;  Surgeon: Vernie Murders, MD;  Location: [facility];  Service: ENT;  Laterality: N/A;  . TESTICLE SURGERY    . VIDEO ASSISTED THORACOSCOPY (VATS)/THOROCOTOMY Right 11/18/2014   Procedure: VIDEO ASSISTED THORACOSCOPY (VATS)/ wedge resection biopsy;  Surgeon: Hulda Marin, MD;  Location: [facility];  Service: General;  Laterality: Right;     Medications:  Outpatient Encounter Medications as of 07/15/2018  Medication Sig  . albuterol (PROVENTIL) (2.5 MG/3ML) 0.083% nebulizer solution Take 3 mLs (2.5 mg total) by nebulization every 6 (six) hours as needed.  [name] amLODipine-benazepril (LOTREL) 5-10 MG capsule TAKE 1 CAPSULE  BY MOUTH EVERY DAY  . aspirin EC 81 MG tablet Take 81 mg by mouth every morning.  . chlorpheniramine-HYDROcodone (TUSSIONEX PENNKINETIC ER) 10-8 MG/5ML SUER Take 5 mLs by mouth 2 (two) times daily.  [name] guaiFENesin (ROBITUSSIN) 100 MG/5ML SOLN Take 5 mLs (100 mg total) by mouth every 4 (four) hours as needed for cough or to loosen phlegm.  . Multiple Vitamins-Minerals (CENTRUM SILVER PO) Take by mouth daily.  . nitroGLYCERIN (NITROSTAT) 0.4 MG SL tablet Place 1 tablet (0.4 mg total) under the tongue every 5 (five) minutes as needed.  . rosuvastatin  (CRESTOR) 10 MG tablet Take 1 tablet (10 mg total) by mouth daily.  [name] gabapentin (NEURONTIN) 300 MG capsule TAKE 1 TAB BY MOUTH 3 TIMES A DAY FOR NERVE PAIN  . traMADol (ULTRAM) 50 MG tablet Take 1 tablet (50 mg total) by mouth every 6 (six) hours as needed. (Patient not taking: Reported on 04/09/2018)   No facility-administered encounter medications on file as of 07/15/2018.      Allergies:  Allergies  Allergen Reactions  . Metoprolol Succinate Other (See Comments)    Reaction:  Unknown   . Niacin Other (See Comments)    Reaction:  Unknown   . Sertraline Hcl Other (See Comments)    Reaction:  Unknown     Family History: Family History  Problem Relation Age of Onset  . Coronary artery disease Mother        s/p CABG  . Heart failure Mother   . Heart disease Other   . Depression Other     Social History: Social History   Tobacco Use  . Smoking status: Former Smoker    Packs/day: 0.50    Years: 1.00    Pack years: 0.50    Types: Cigarettes    Last attempt to quit: 05/09/1968    Years since quitting: 50.2  . Smokeless tobacco: Never Used  Substance Use Topics  . Alcohol use: No  . Drug use: No   Social History   Social History Narrative   Divorced but lives with ex-wife   On SSI disability, wife with mental illness       Review of Systems:  CONSTITUTIONAL: No fevers, chills, night sweats, or weight loss.   EYES: No visual changes or eye pain ENT: No hearing changes.  No history of nose bleeds.   RESPIRATORY: No cough, wheezing and shortness of breath.   CARDIOVASCULAR: Negative for chest pain, and palpitations.   GI: Negative for abdominal discomfort, blood in stools or black stools.  No recent change in bowel habits.   GU:  No history of incontinence.   MUSCLOSKELETAL: +history of joint pain or swelling.  No myalgias.   SKIN: Negative for lesions, rash, and itching.   HEMATOLOGY/ONCOLOGY: Negative for prolonged bleeding, bruising easily, and swollen nodes.   No history of cancer.   ENDOCRINE: Negative for cold or heat intolerance, polydipsia or goiter.   PSYCH:  No depression or anxiety symptoms.   NEURO: As Above.   Vital Signs:  BP 100/70   Pulse 79   Ht 5\' 9"  (1.753 m)   Wt 157 lb 2 oz (71.3 kg)   SpO2 97%   BMI 23.20 kg/m    General Medical Exam:   General:  Well appearing, comfortable.   Eyes/ENT: see cranial nerve examination.   Neck: No masses appreciated.  Full range of motion without tenderness.  No carotid bruits. Respiratory:  Clear to auscultation, good air entry bilaterally.  Cardiac:  Regular rate and rhythm, no murmur.   Extremities:  No deformities, edema, or skin discoloration.  Skin:  No rashes or lesions.  Neurological Exam: MENTAL STATUS including orientation to time, place, person, recent and remote memory, attention span and concentration, language, and fund of knowledge is normal.  Speech is not dysarthric.  CRANIAL NERVES: II:  No visual field defects.  Unremarkable fundi.   III-IV-VI: Pupils equal round and reactive to light.  Normal conjugate, extra-ocular eye movements in all directions of gaze.  No nystagmus.  No ptosis.  V:  Normal facial sensation.    VII:  Normal facial symmetry and movements.   VIII:  Normal hearing and vestibular function.   IX-X:  Normal palatal movement.   XI:  Normal shoulder shrug and head rotation.   XII:  Normal tongue strength and range of motion, no deviation or fasciculation.  MOTOR:  No atrophy, fasciculations or abnormal movements.  No pronator drift.  Tone is normal.    Right Upper Extremity:    Left Upper Extremity:    Deltoid  5/5   Deltoid  5/5   Biceps  5/5   Biceps  5/5   Triceps  5/5   Triceps  5/5   Wrist extensors  5/5   Wrist extensors  5/5   Wrist flexors  5/5   Wrist flexors  5/5   Finger extensors  5/5   Finger extensors  5/5   Finger flexors  5/5   Finger flexors  5/5   Dorsal interossei  5/5   Dorsal interossei  5/5   Abductor pollicis  5/5    Abductor pollicis  5/5   Tone (Ashworth scale)  0  Tone (Ashworth scale)  0   Right Lower Extremity:    Left Lower Extremity:    Hip flexors  5/5   Hip flexors  5/5   Hip extensors  5/5   Hip extensors  5/5   Knee flexors  5/5   Knee flexors  5/5   Knee extensors  5/5   Knee extensors  5/5   Dorsiflexors  5/5   Dorsiflexors  5/5   Plantarflexors  5/5   Plantarflexors  5/5   Toe extensors  5/5   Toe extensors  5/5   Toe flexors  5/5   Toe flexors  5/5   Tone (Ashworth scale)  0  Tone (Ashworth scale)  0   MSRs:  Right                                                                 Left brachioradialis 2+  brachioradialis 2+  biceps 2+  biceps 2+  triceps 2+  triceps 2+  patellar 2+  patellar 2+  ankle jerk 1+  ankle jerk 0  Hoffman no  Hoffman no  plantar response down  plantar response down   SENSORY: Vibration is absent at the great toe bilaterally and left ankle.  Pinprick and temperature is reduced over the dorsum of the left foot.  Sensation is globally intact in the arms.  Romberg's sign absent.   COORDINATION/GAIT: Normal finger-to- nose-finger.  Intact rapid alternating movements bilaterally.  Gait narrow based and stable.   IMPRESSION: Left leg pain and paresthesias, symptoms may be due to  a possible L5 radiculopathy.  He did have CT lumbar spine performed in October 2019 which did not show significant stenosis.  I recommend NCS/EMG of the left  > right leg to further evaluate symptoms.    He may also have a mild distal neuropathy based on reduced sensation distally in the toes, however this would not explain his proximal left leg symptoms.  He does not have history of diabetes or alcohol use.  If there is neuropathy on his nerve testing, additional labs will be checked.  Further recommendations pending results.    Thank you for allowing me to participate in patient's care.  If I can answer any additional questions, I would be pleased to do so.     Sincerely,    Shane Frangos K. Allena Katz, DO

## 2018-07-15 ENCOUNTER — Encounter: Payer: Self-pay | Admitting: Neurology

## 2018-07-15 ENCOUNTER — Ambulatory Visit (INDEPENDENT_AMBULATORY_CARE_PROVIDER_SITE_OTHER): Payer: Medicare Other | Admitting: Neurology

## 2018-07-15 VITALS — BP 100/70 | HR 79 | Ht 69.0 in | Wt 157.1 lb

## 2018-07-15 DIAGNOSIS — R202 Paresthesia of skin: Secondary | ICD-10-CM | POA: Diagnosis not present

## 2018-07-15 NOTE — Patient Instructions (Signed)
NCS/EMG of the left > right leg

## 2018-07-25 ENCOUNTER — Encounter: Payer: Medicare Other | Admitting: Neurology

## 2018-09-04 NOTE — Progress Notes (Signed)
Patient ID: Shane Reid., male   DOB: 07/20/1945, 73 y.o.   MRN: [id number] Cardiology Office Note  Date:  09/05/2018   ID:  Shane Reid., DOB 1945-07-04, MRN [id number]  PCP:  Shane Fuchs, MD   Chief complaint: Shortness of breath with cough  Patient was seen personally in the office by myself  HPI:  Shane Reid is a 73 year old gentleman with a history of  coronary artery disease, PCI with LAD PCI in 2008,  chronic chest pain which is musculoskeletal,  traumatic injury to the chest,   esophageal strictures, status post esophageal stretching,  h/o left neck pain radiating to the arm Felt to be musculoskeletal, and  Chronic shortness of breath With CT scan documenting pulmonary fibrosis  who follows up for his coronary artery disease  Followed by pulmonary at [facility] every 6 months   CT scan documenting Pulmonary fibrosis (11/23/15) Followed by pulmonary at [facility] Mild restriction with moderately reduced DLCO.  He denies any significant symptoms or progression Does not want any medications for his pulmonary fibrosis Is not using oxygen on a regular basis Reports he did well on his most recent 6-minute walk test  Has a cough in the morning, clears out mild amount of sputum and feels better Chronic issue, not getting worse  Rare episodes chest pain, typically not with exertion Outside doing his yard work, mowing, otherwise feels fine   Other past medical history Previous lung biopsy by Dr. Inez Catalina, has follow-up with pulmonary in [place].  Prior trauma to his mediastinum, Had severe rib pain, upper left mediastinal area,   previous stress test for chest pain 11/27/2013. There was no ischemia, ejection fraction 67%. Overall a low risk scan  admitted to the hospital 12/01/2013 with shortness of breath, cough, fever of 103.5. He was started on antibiotics for possible pneumonia. He left AMA today without any antibiotics. Hospital records were reviewed that  showed x-ray with diffuse interstitial pattern in the lungs likely representing chronic fibrosis, unable to exclude pneumonitis. Magnesium was low. Creatinine 1.69, BUN 12. He was given several doses of ceftriaxone blood cultures were negative  Workup for chest pain 08/06/2013. Workup was negative  Previous trips to the emergency room 03/31/2013 for chest pain. He has periodic episodes of chest pain lasting for up to 2 or 3 days a time. In the center of his chest, reproducible with palpation. He did not stay for admission to the hospital, cardiac workup was essentially negative. In the past he has not wanted to take a pain medication or anti-inflammatory. Wife reports that he throws these medicines away.  cardiac catheterization in 2013 showing patent stent, no severe stenoses that would contribute to his chest pain. Right heart catheterization was done at the same time that showed normal right heart pressures (despite [facility] pulmonary insisting he had dilated pulmonary artery and high pressures).  he has had followup at [facility] pulmonary since then with right heart catheterization again confirming by his report, normal right heart pressures. He reports they do not know why he is short of breath .   He had a stress test in February of 2011 that was negative. Myoview study. Carotid ultrasound done for bruits showed no significant atherosclerotic disease. chest x-ray November 12 2009 shows hyperinflation of the lungs consistent with COPD, areas of fibrosis appear present as well echocardiogram done in October 2011 showed normal systolic function, diastolic relaxation abnormality otherwise no significant abnormalities  PMH:   has a past medical history  of Anxiety, Asthma, Bradycardia, CHF (congestive heart failure) (HCC), Chronic Chest Pain, COPD (chronic obstructive pulmonary disease) (HCC), Coronary artery disease, Depression, GERD (gastroesophageal reflux disease), Heart murmur, Hypercholesteremia,  Hypertension, Hypertensive heart disease, Idiopathic pulmonary fibrosis (HCC), and Other social stressor.  PSH:    Past Surgical History:  Procedure Laterality Date  . CARDIAC CATHETERIZATION    . CORONARY ANGIOPLASTY     stents times 4  . Esophagus stretch    . EYE SURGERY     Bilateral cataracts  . LUNG BIOPSY    . NASAL ENDOSCOPY N/A 12/21/2016   Procedure: NASAL ENDOSCOPY;  Surgeon: Vernie Murders, MD;  Location: [facility];  Service: ENT;  Laterality: N/A;  . TESTICLE SURGERY    . VIDEO ASSISTED THORACOSCOPY (VATS)/THOROCOTOMY Right 11/18/2014   Procedure: VIDEO ASSISTED THORACOSCOPY (VATS)/ wedge resection biopsy;  Surgeon: Hulda Marin, MD;  Location: [facility];  Service: General;  Laterality: Right;    Current Outpatient Medications  Medication Sig Dispense Refill  . albuterol (PROVENTIL) (2.5 MG/3ML) 0.083% nebulizer solution Take 3 mLs (2.5 mg total) by nebulization every 6 (six) hours as needed. 75 mL 11  . amLODipine-benazepril (LOTREL) 5-10 MG capsule TAKE 1 CAPSULE BY MOUTH EVERY DAY 90 capsule 3  . aspirin EC 81 MG tablet Take 81 mg by mouth every morning.    . gabapentin (NEURONTIN) 300 MG capsule TAKE 1 TAB BY MOUTH 3 TIMES A DAY FOR NERVE PAIN  3  . Multiple Vitamins-Minerals (CENTRUM SILVER PO) Take by mouth daily.    . nitroGLYCERIN (NITROSTAT) 0.4 MG SL tablet Place 1 tablet (0.4 mg total) under the tongue every 5 (five) minutes as needed. 25 tablet 0  . rosuvastatin (CRESTOR) 10 MG tablet Take 1 tablet (10 mg total) by mouth daily. 90 tablet 0  . traMADol (ULTRAM) 50 MG tablet Take 1 tablet (50 mg total) by mouth every 6 (six) hours as needed. (Patient not taking: Reported on 09/05/2018) 15 tablet 0   No current facility-administered medications for this visit.      Allergies:   Metoprolol succinate; Niacin; and Sertraline hcl   Social History:  The patient  reports that he quit smoking about 50 years ago. His smoking use included cigarettes. He has a 0.50  pack-year smoking history. He has never used smokeless tobacco. He reports that he does not drink alcohol or use drugs.   Family History:   family history includes Coronary artery disease in his mother; Depression in an other family member; Heart disease in an other family member; Heart failure in his mother.    Review of Systems: Review of Systems  Constitutional: Negative.   Respiratory: Positive for shortness of breath.  Chronic cough Cardiovascular: Negative.   Gastrointestinal: Negative.   Musculoskeletal: Positive for joint pain.  Knees, shoulders      Right shoulder Pain Neurological: Negative.   Psychiatric/Behavioral: Negative.   All other systems reviewed and are negative.    PHYSICAL EXAM: VS:  Ht 5\' 9"  (1.753 m)   BMI 23.20 kg/m  , BMI Body mass index is 23.2 kg/m.  Blood pressure 130 over 70s, rate 70 respiration 16  Constitutional:  oriented to person, place, and time. No distress.  HENT:  Head: Normocephalic and atraumatic.  Eyes:  no discharge. No scleral icterus.  Neck: Normal range of motion. Neck supple. No JVD present.  Cardiovascular: Normal rate, regular rhythm, normal heart sounds and intact distal pulses. Exam reveals no gallop and no friction rub. No edema No  murmur heard. Pulmonary/Chest: Rales at the bases worse on the right than the left, halfway up on the right  distress.  no wheezes. + rales At the bases bilaterally .  no tenderness.  Abdominal: Soft.  no distension.  no tenderness.  Musculoskeletal: Normal range of motion.  no  tenderness or deformity.  Neurological:  normal muscle tone. Coordination normal. No atrophy Skin: Skin is warm and dry. No rash noted. not diaphoretic.  Psychiatric:  normal mood and affect. behavior is normal. Thought content normal.       Recent Labs: 05/01/2018: BUN 14; Creatinine, Ser 1.17; Hemoglobin 12.8; Platelets 239; Potassium 3.7; Sodium 141    Lipid Panel Lab Results  Component Value Date   CHOL 94  (L) 10/02/2017   HDL 34 (L) 10/02/2017   LDLCALC 44 10/02/2017   TRIG 81 10/02/2017      Wt Readings from Last 3 Encounters:  07/15/18 157 lb 2 oz (71.3 kg)  07/10/18 161 lb (73 kg)  05/01/18 163 lb 5.8 oz (74.1 kg)       ASSESSMENT AND PLAN:  Chest pain, unspecified chest pain type - Plan: EKG 12-Lead Atypical pain, No further ischemic work-up at this time  Syncope, unspecified syncope type - Plan: EKG 12-Lead None recently, no near syncope Vitals are stable  Hypertensive heart disease without heart failure Blood pressure stable, no changes to his medications  Shortness of breath Long history of pulmonary fibrosis [facility] Not on oxygen coming into the office today, no distress Does not want medications  ILD (interstitial lung disease) (HCC) Seen by pulmonary Not on therapy by choice  Deviated septum Previous surgery  Cad with stable angina Long discussion with him concerning symptoms of stable angina to watch for He currently denies any symptoms, no further work-up Cholesterol at goal   Patient was seen personally in the office by myself  Total encounter time more than 25 minutes  Greater than 50% was spent in counseling and coordination of care with the patient   Disposition:   F/U  12 months   No orders of the defined types were placed in this encounter.    Signed, Dossie Arbourim Gollan, M.D., Ph.D. 09/05/2018  [facility] [place] [phone]

## 2018-09-05 ENCOUNTER — Telehealth: Payer: Self-pay

## 2018-09-05 ENCOUNTER — Other Ambulatory Visit: Payer: Self-pay

## 2018-09-05 ENCOUNTER — Telehealth (INDEPENDENT_AMBULATORY_CARE_PROVIDER_SITE_OTHER): Payer: Medicare Other | Admitting: Cardiovascular Disease

## 2018-09-05 ENCOUNTER — Encounter: Payer: Self-pay | Admitting: Cardiovascular Disease

## 2018-09-05 VITALS — Ht 69.0 in

## 2018-09-05 DIAGNOSIS — I25118 Atherosclerotic heart disease of native coronary artery with other forms of angina pectoris: Secondary | ICD-10-CM

## 2018-09-05 DIAGNOSIS — E785 Hyperlipidemia, unspecified: Secondary | ICD-10-CM

## 2018-09-05 DIAGNOSIS — J849 Interstitial pulmonary disease, unspecified: Secondary | ICD-10-CM | POA: Diagnosis not present

## 2018-09-05 DIAGNOSIS — R0602 Shortness of breath: Secondary | ICD-10-CM

## 2018-09-05 DIAGNOSIS — I10 Essential (primary) hypertension: Secondary | ICD-10-CM

## 2018-09-05 DIAGNOSIS — I259 Chronic ischemic heart disease, unspecified: Secondary | ICD-10-CM

## 2018-09-05 MED ORDER — ROSUVASTATIN CALCIUM 10 MG PO TABS: 10.0000 mg | ORAL_TABLET | Freq: Every day | ORAL | 3 refills | Status: DC

## 2018-09-05 NOTE — Telephone Encounter (Signed)
Refill sent for Crestor 10 mg  

## 2018-09-05 NOTE — Telephone Encounter (Signed)
Spoke with patients wife.  Reviewed medications/allergies Obtained any vitals the patient had. (Pt does not have a BP cuff and weight was unknown) Confirmed that consent was read to him.  Patients wife stated that consent was read to them both and they both agreed.  I will document consent.     Virtual Visit Pre-Appointment Phone Call  Steps For Call:  1. Confirm consent - "In the setting of the current Covid19 crisis, you are scheduled for a TELEPHONE visit with your provider on 09/05/2018 at 11:00AM.  Just as we do with many in-office visits, in order for you to participate in this visit, we must obtain consent.  If you'd like, I can send this to your mychart (if signed up) or email for you to review.  Otherwise, I can obtain your verbal consent now.  All virtual visits are billed to your insurance company just like a normal visit would be.  By agreeing to a virtual visit, we'd like you to understand that the technology does not allow for your provider to perform an examination, and thus may limit your provider's ability to fully assess your condition.  Finally, though the technology is pretty good, we cannot assure that it will always work on either your or our end, and in the setting of a video visit, we may have to convert it to a phone-only visit.  In either situation, we cannot ensure that we have a secure connection.  Are you willing to proceed?"  2. Give patient instructions for WebEx download to smartphone as below if video visit  3. Advise patient to be prepared with any vital sign or heart rhythm information, their current medicines, and a piece of paper and pen handy for any instructions they may receive the day of their visit  4. Inform patient they will receive a phone call 15 minutes prior to their appointment time (may be from unknown caller ID) so they should be prepared to answer  5. Confirm that appointment type is correct in Epic appointment notes (video vs telephone)     TELEPHONE CALL NOTE  Shane Reid. has been deemed a candidate for a follow-up tele-health visit to limit community exposure during the Covid-19 pandemic. I spoke with the patient via phone to ensure availability of phone/video source, confirm preferred email & phone number, and discuss instructions and expectations.  I reminded Shane Reid. to be prepared with any vital sign and/or heart rhythm information that could potentially be obtained via home monitoring, at the time of his visit. I reminded Shane Reid. to expect a phone call at the time of his visit if his visit.  Did the patient verbally acknowledge consent to treatment? yes  Shane Reid, [place] 09/05/2018 9:35 AM   CONSENT FOR TELE-HEALTH VISIT - PLEASE REVIEW  I hereby voluntarily request, consent and authorize [facility] and its employed or contracted physicians, physician assistants, nurse practitioners or other licensed health care professionals (the Practitioner), to provide me with telemedicine health care services (the "Services") as deemed necessary by the treating Practitioner. I acknowledge and consent to receive the Services by the Practitioner via telemedicine. I understand that the telemedicine visit will involve communicating with the Practitioner through live audiovisual communication technology and the disclosure of certain medical information by electronic transmission. I acknowledge that I have been given the opportunity to request an in-person assessment or other available alternative prior to the telemedicine visit and am voluntarily participating in the telemedicine  visit.  I understand that I have the right to withhold or withdraw my consent to the use of telemedicine in the course of my care at any time, without affecting my right to future care or treatment, and that the Practitioner or I may terminate the telemedicine visit at any time. I understand that I have the right to inspect all  information obtained and/or recorded in the course of the telemedicine visit and may receive copies of available information for a reasonable fee.  I understand that some of the potential risks of receiving the Services via telemedicine include:  [name] Delay or interruption in medical evaluation due to technological equipment failure or disruption; . Information transmitted may not be sufficient (e.g. poor resolution of images) to allow for appropriate medical decision making by the Practitioner; and/or  . In rare instances, security protocols could fail, causing a breach of personal health information.  Furthermore, I acknowledge that it is my responsibility to provide information about my medical history, conditions and care that is complete and accurate to the best of my ability. I acknowledge that Practitioner's advice, recommendations, and/or decision may be based on factors not within their control, such as incomplete or inaccurate data provided by me or distortions of diagnostic images or specimens that may result from electronic transmissions. I understand that the practice of medicine is not an exact science and that Practitioner makes no warranties or guarantees regarding treatment outcomes. I acknowledge that I will receive a copy of this consent concurrently upon execution via email to the email address I last provided but may also request a printed copy by calling the office of [facility].    I understand that my insurance will be billed for this visit.   I have read or had this consent read to me. . I understand the contents of this consent, which adequately explains the benefits and risks of the Services being provided via telemedicine.  . I have been provided ample opportunity to ask questions regarding this consent and the Services and have had my questions answered to my satisfaction. . I give my informed consent for the services to be provided through the use of telemedicine in my  medical care  By participating in this telemedicine visit I agree to the above.

## 2018-09-05 NOTE — Patient Instructions (Signed)

## 2018-09-27 ENCOUNTER — Ambulatory Visit: Admission: RE | Admit: 2018-09-27 | Payer: Medicare Other | Source: Ambulatory Visit

## 2018-09-27 ENCOUNTER — Ambulatory Visit
Admission: RE | Admit: 2018-09-27 | Discharge: 2018-09-27 | Disposition: A | Discharge status: Home / Self Care | Payer: Medicare Other | Source: Ambulatory Visit | Attending: Family Medicine | Admitting: Family Medicine

## 2018-09-27 ENCOUNTER — Other Ambulatory Visit: Payer: Self-pay | Admitting: Family Medicine

## 2018-09-27 ENCOUNTER — Ambulatory Visit
Admission: RE | Admit: 2018-09-27 | Discharge: 2018-09-27 | Disposition: A | Discharge status: Home / Self Care | Payer: Medicare Other | Attending: Family Medicine | Admitting: Family Medicine

## 2018-09-27 DIAGNOSIS — J8410 Pulmonary fibrosis, unspecified: Secondary | ICD-10-CM | POA: Diagnosis not present

## 2018-11-15 ENCOUNTER — Emergency Department
Admission: EM | Admit: 2018-11-15 | Discharge: 2018-11-15 | Disposition: A | Discharge status: Home / Self Care | Payer: Medicare Other | Attending: Emergency Medicine | Admitting: Emergency Medicine

## 2018-11-15 ENCOUNTER — Encounter: Payer: Self-pay | Admitting: Emergency Medicine

## 2018-11-15 DIAGNOSIS — I509 Heart failure, unspecified: Secondary | ICD-10-CM | POA: Insufficient documentation

## 2018-11-15 DIAGNOSIS — Y9289 Other specified places as the place of occurrence of the external cause: Secondary | ICD-10-CM | POA: Diagnosis not present

## 2018-11-15 DIAGNOSIS — Y9389 Activity, other specified: Secondary | ICD-10-CM | POA: Insufficient documentation

## 2018-11-15 DIAGNOSIS — W57XXXA Bitten or stung by nonvenomous insect and other nonvenomous arthropods, initial encounter: Secondary | ICD-10-CM | POA: Insufficient documentation

## 2018-11-15 DIAGNOSIS — J449 Chronic obstructive pulmonary disease, unspecified: Secondary | ICD-10-CM | POA: Insufficient documentation

## 2018-11-15 DIAGNOSIS — Z79899 Other long term (current) drug therapy: Secondary | ICD-10-CM | POA: Diagnosis not present

## 2018-11-15 DIAGNOSIS — S30861A Insect bite (nonvenomous) of abdominal wall, initial encounter: Secondary | ICD-10-CM | POA: Diagnosis not present

## 2018-11-15 DIAGNOSIS — Z87891 Personal history of nicotine dependence: Secondary | ICD-10-CM | POA: Insufficient documentation

## 2018-11-15 DIAGNOSIS — Y998 Other external cause status: Secondary | ICD-10-CM | POA: Diagnosis not present

## 2018-11-15 DIAGNOSIS — I110 Hypertensive heart disease with heart failure: Secondary | ICD-10-CM | POA: Insufficient documentation

## 2018-11-15 MED ORDER — DOXYCYCLINE MONOHYDRATE 100 MG PO TABS: 100.0000 mg | ORAL_TABLET | Freq: Two times a day (BID) | ORAL | 0 refills | Status: AC

## 2018-11-15 NOTE — ED Provider Notes (Signed)
[facility] Emergency Department Provider Note  ____________________________________________  Time seen: Approximately 7:56 PM  I have reviewed the triage vital signs and the nursing notes.   HISTORY  Chief Complaint Tick Removal    HPI Shane Reid. is a 73 y.o. male presents to the emergency department after being bitten by a tick.  Patient is unsure whether or not tick head remains embedded in skin of abdomen.  Patient is also noticed some erythema surrounding tick bite and became concerned.  No headache, chills, body aches or rash.  No other alleviating measures have been attempted.        Past Medical History:  Diagnosis Date  . Anxiety   . Asthma   . Bradycardia   . CHF (congestive heart failure) (HCC)   . Chronic Chest Pain   . COPD (chronic obstructive pulmonary disease) (HCC)   . Coronary artery disease    a. 2008 s/p PCI to LAD;  b. 2013 Cath: patent stent->Med Rx;  c. 11/2013 MV: EF 67%, no ischemia/infarct.  . Depression   . GERD (gastroesophageal reflux disease)   . Heart murmur    a. 02/2010 Echo: EF 55-60%, no rwma, Gr 2 DD, triv MR, mildly dil LA, nl RV.  [name] Hypercholesteremia   . Hypertension   . Hypertensive heart disease   . Idiopathic pulmonary fibrosis (HCC)    a. Seen by Dr. Dema Severin 01/2015 and Rx Esbriet - pt cannot afford.  . Other social stressor    a. wife with mental illness    Patient Active Problem List   Diagnosis Date Noted  . Hypertension 04/09/2018  . Pain in limb 04/09/2018  . 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 Benign localized hyperplasia of prostate with urinary obstruction 03/01/2017  . Incomplete emptying of bladder 03/01/2017  . Scrotal pain 09/11/2016  . Oropharyngeal dysphagia 09/21/2015  . Hypertensive heart disease   . Chronic Chest Pain   . Ischemic heart disease   . Hypercholesteremia   . 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 IPF (idiopathic pulmonary  fibrosis) (HCC) 12/23/2014  . Pre-operative cardiovascular examination 11/05/2014  . ILD (interstitial lung disease) (HCC) 09/04/2014  . Interstitial lung disease (HCC) 06/22/2014  . Upper respiratory infection 12/03/2013  . 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 CAROTID BRUIT, LEFT 05/12/2010  . Hyperlipidemia 07/01/2009  . CAD, NATIVE VESSEL 07/01/2009  . SHORTNESS OF BREATH 07/01/2009  . Atypical chest pain 07/01/2009  . Family history of early CAD 01/23/2007  . Anxiety and depression 02/02/2005    Past Surgical History:  Procedure Laterality Date  . CARDIAC CATHETERIZATION    . CORONARY ANGIOPLASTY     stents times 4  . Esophagus stretch    . EYE SURGERY     Bilateral cataracts  . LUNG BIOPSY    . NASAL ENDOSCOPY N/A 12/21/2016   Procedure: NASAL ENDOSCOPY;  Surgeon: Vernie Murders, MD;  Location: [facility];  Service: ENT;  Laterality: N/A;  . TESTICLE SURGERY    . VIDEO ASSISTED THORACOSCOPY (VATS)/THOROCOTOMY Right 11/18/2014   Procedure: VIDEO ASSISTED THORACOSCOPY (VATS)/ wedge resection biopsy;  Surgeon: Hulda Marin, MD;  Location: [facility];  Service: General;  Laterality: Right;    Prior to Admission medications   Medication Sig Start Date End Date Taking? Authorizing Provider  albuterol (PROVENTIL) (2.5 MG/3ML) 0.083% nebulizer solution Take 3 mLs (2.5 mg total) by nebulization every 6 (six) hours as needed. 05/02/12   Antonieta Iba, MD  amLODipine-benazepril (  LOTREL) 5-10 MG capsule TAKE 1 CAPSULE BY MOUTH EVERY DAY 05/20/18   Antonieta IbaGollan, [name], MD  aspirin EC 81 MG tablet Take 81 mg by mouth every morning. 08/06/12   [provider]  doxycycline (ADOXA) 100 MG tablet Take 1 tablet (100 mg total) by mouth 2 (two) times daily for 7 days. 11/15/18 11/22/18  Orvil FeilWoods, Jaclyn M, PA-C  gabapentin (NEURONTIN) 300 MG capsule TAKE 1 TAB BY MOUTH 3 TIMES A DAY FOR NERVE PAIN 03/07/18   [provider]  Multiple Vitamins-Minerals (CENTRUM SILVER PO) Take by  mouth daily.    [provider]  nitroGLYCERIN (NITROSTAT) 0.4 MG SL tablet Place 1 tablet (0.4 mg total) under the tongue every 5 (five) minutes as needed. 07/30/17   Antonieta IbaGollan, [name], MD  rosuvastatin (CRESTOR) 10 MG tablet Take 1 tablet (10 mg total) by mouth daily. 09/05/18   Antonieta IbaGollan, [name], MD  traMADol (ULTRAM) 50 MG tablet Take 1 tablet (50 mg total) by mouth every 6 (six) hours as needed. Patient not taking: Reported on 09/05/2018 03/24/18   Faythe GheeFisher, Susan W, PA-C    Allergies Metoprolol succinate, Niacin, and Sertraline hcl  Family History  Problem Relation Age of Onset  . Coronary artery disease Mother        s/p CABG  . Heart failure Mother   . Heart disease Other   . Depression Other     Social History Social History   Tobacco Use  . Smoking status: Former Smoker    Packs/day: 0.50    Years: 1.00    Pack years: 0.50    Types: Cigarettes    Quit date: 05/09/1968    Years since quitting: 50.5  . Smokeless tobacco: Never Used  Substance Use Topics  . Alcohol use: No  . Drug use: No     Review of Systems  Constitutional: No fever/chills Eyes: No visual changes. No discharge ENT: No upper respiratory complaints. Cardiovascular: no chest pain. Respiratory: no cough. No SOB. Gastrointestinal: No abdominal pain.  No nausea, no vomiting.  No diarrhea.  No constipation. Musculoskeletal: Negative for musculoskeletal pain. Skin: Patient has tick bite.  Neurological: Negative for headaches, focal weakness or numbness.   ____________________________________________   PHYSICAL EXAM:  VITAL SIGNS: ED Triage Vitals  Enc Vitals Group     BP 11/15/18 1615 129/83     Pulse Rate 11/15/18 1615 71     Resp 11/15/18 1615 18     Temp 11/15/18 1615 97.8 F (36.6 C)     Temp Source 11/15/18 1615 Oral     SpO2 11/15/18 1615 96 %     Weight --      Height 11/15/18 1617 5\' 9"  (1.753 m)     Head Circumference --      Peak Flow --      Pain Score 11/15/18 1617 0      Pain Loc --      Pain Edu? --      Excl. in GC? --      Constitutional: Alert and oriented. Well appearing and in no acute distress. Eyes: Conjunctivae are normal. PERRL. EOMI. Head: Atraumatic. Cardiovascular: Normal rate, regular rhythm. Normal S1 and S2.  Good peripheral circulation. Respiratory: Normal respiratory effort without tachypnea or retractions. Lungs CTAB. Good air entry to the bases with no decreased or absent breath sounds. Musculoskeletal: Full range of motion to all extremities. No gross deformities appreciated. Neurologic:  Normal speech and language. No gross focal neurologic deficits are  appreciated.  Skin: Patient has tick bite visualized at right lower quadrant of abdomen.  No tick head was visualized.  Psychiatric: Mood and affect are normal. Speech and behavior are normal. Patient exhibits appropriate insight and judgement.   ____________________________________________   LABS (all labs ordered are listed, but only abnormal results are displayed)  Labs Reviewed - No data to display ____________________________________________  EKG   ____________________________________________  RADIOLOGY   No results found.  ____________________________________________    PROCEDURES  Procedure(s) performed:    Procedures    Medications - No data to display   ____________________________________________   INITIAL IMPRESSION / ASSESSMENT AND PLAN / ED COURSE  Pertinent labs & imaging results that were available during my care of the patient were reviewed by me and considered in my medical decision making (see chart for details).  Review of the [place] CSRS was performed in accordance of the [place] prior to dispensing any controlled drugs.           Assessment and plan Tick bite 73 year old male presents to the emergency department after being bitten by a tick.  Tick had already been removed prior to presenting to the emergency department.   Patient was discharged with doxycycline.  He was advised to follow-up with primary care as needed.  All patient questions were answered.    ____________________________________________  FINAL CLINICAL IMPRESSION(S) / ED DIAGNOSES  Final diagnoses:  Tick bite, initial encounter      NEW MEDICATIONS STARTED DURING THIS VISIT:  ED Discharge Orders         Ordered    doxycycline (ADOXA) 100 MG tablet  2 times daily     11/15/18 1906              This chart was dictated using voice recognition software/Dragon. Despite best efforts to proofread, errors can occur which can change the meaning. Any change was purely unintentional.    Lannie Fields, PA-C 11/15/18 Jackquline Berlin, MD 11/15/18 [phone]

## 2018-11-15 NOTE — ED Triage Notes (Signed)
Pt to ED with c/o of tick bite. Pt states he removed tick but unsure he removed all of it.

## 2018-11-15 NOTE — ED Notes (Signed)
Pt verbalized understanding of discharge instructions. NAD at this time. 

## 2019-01-20 ENCOUNTER — Other Ambulatory Visit: Payer: Self-pay

## 2019-01-20 ENCOUNTER — Encounter: Payer: Self-pay | Admitting: Emergency Medicine

## 2019-01-20 DIAGNOSIS — R0602 Shortness of breath: Secondary | ICD-10-CM | POA: Insufficient documentation

## 2019-01-20 DIAGNOSIS — Z5321 Procedure and treatment not carried out due to patient leaving prior to being seen by health care provider: Secondary | ICD-10-CM | POA: Insufficient documentation

## 2019-01-20 LAB — CBC
HCT: 38.7 % — ABNORMAL LOW (ref 39.0–52.0)
Hemoglobin: 13 g/dL (ref 13.0–17.0)
MCH: 28.3 pg (ref 26.0–34.0)
MCHC: 33.6 g/dL (ref 30.0–36.0)
MCV: 84.3 fL (ref 80.0–100.0)
Platelets: 248 10*3/uL (ref 150–400)
RBC: 4.59 MIL/uL (ref 4.22–5.81)
RDW: 13.8 % (ref 11.5–15.5)
WBC: 15.6 10*3/uL — ABNORMAL HIGH (ref 4.0–10.5)
nRBC: 0 % (ref 0.0–0.2)

## 2019-01-20 LAB — BASIC METABOLIC PANEL
Anion gap: 11 (ref 5–15)
BUN: 13 mg/dL (ref 8–23)
CO2: 22 mmol/L (ref 22–32)
Calcium: 8.7 mg/dL — ABNORMAL LOW (ref 8.9–10.3)
Chloride: 106 mmol/L (ref 98–111)
Creatinine, Ser: 1.18 mg/dL (ref 0.61–1.24)
GFR calc Af Amer: >60 mL/min (ref >60)
GFR calc non Af Amer: >60 mL/min (ref >60)
Glucose, Bld: 184 mg/dL — ABNORMAL HIGH (ref 70–99)
Potassium: 3.4 mmol/L — ABNORMAL LOW (ref 3.5–5.1)
Sodium: 139 mmol/L (ref 135–145)

## 2019-01-20 LAB — TROPONIN I (HIGH SENSITIVITY): Troponin I (High Sensitivity): 10 ng/L (ref <18)

## 2019-01-20 NOTE — ED Triage Notes (Signed)
Patient to ER for c/o shortness of breath and left sided headache x2 days. Patient appears fatigued, but able to speak in complete sentences without difficulty. Patient has h/o pulmonary fibrosis and states "I don't have any air conditioning.". Patient has not seen pulmonary doctor in [place] in one year per patient.

## 2019-01-21 ENCOUNTER — Emergency Department
Admission: EM | Admit: 2019-01-21 | Discharge: 2019-01-21 | Disposition: A | Discharge status: Home / Self Care | Payer: Medicare Other | Attending: Emergency Medicine | Admitting: Emergency Medicine

## 2019-01-21 ENCOUNTER — Telehealth: Payer: Self-pay | Admitting: Emergency Medicine

## 2019-01-21 NOTE — ED Notes (Signed)
No answer when called several times from lobby 

## 2019-01-21 NOTE — Telephone Encounter (Signed)
Called patient due to lwot to inquire about condition and follow up plans.  No answer and no voicemail  

## 2019-01-30 ENCOUNTER — Emergency Department
Admission: EM | Admit: 2019-01-30 | Discharge: 2019-01-30 | Discharge status: Against Medical Advice | Payer: Medicare Other | Attending: Emergency Medicine | Admitting: Emergency Medicine

## 2019-01-30 ENCOUNTER — Emergency Department: Payer: Medicare Other

## 2019-01-30 ENCOUNTER — Other Ambulatory Visit: Payer: Self-pay

## 2019-01-30 ENCOUNTER — Encounter: Payer: Self-pay | Admitting: Emergency Medicine

## 2019-01-30 DIAGNOSIS — I110 Hypertensive heart disease with heart failure: Secondary | ICD-10-CM | POA: Insufficient documentation

## 2019-01-30 DIAGNOSIS — I509 Heart failure, unspecified: Secondary | ICD-10-CM | POA: Diagnosis not present

## 2019-01-30 DIAGNOSIS — Z5320 Procedure and treatment not carried out because of patient's decision for unspecified reasons: Secondary | ICD-10-CM | POA: Insufficient documentation

## 2019-01-30 DIAGNOSIS — Z9981 Dependence on supplemental oxygen: Secondary | ICD-10-CM | POA: Diagnosis not present

## 2019-01-30 DIAGNOSIS — R0600 Dyspnea, unspecified: Secondary | ICD-10-CM | POA: Insufficient documentation

## 2019-01-30 DIAGNOSIS — Z87891 Personal history of nicotine dependence: Secondary | ICD-10-CM | POA: Diagnosis not present

## 2019-01-30 DIAGNOSIS — I2510 Atherosclerotic heart disease of native coronary artery without angina pectoris: Secondary | ICD-10-CM | POA: Diagnosis not present

## 2019-01-30 LAB — BLOOD GAS, VENOUS
Acid-Base Excess: 9.8 mmol/L — ABNORMAL HIGH (ref 0.0–2.0)
Bicarbonate: 33.4 mmol/L — ABNORMAL HIGH (ref 20.0–28.0)
O2 Saturation: 98 %
Patient temperature: 37
pCO2, Ven: 40 mmHg — ABNORMAL LOW (ref 44.0–60.0)
pH, Ven: 7.53 — ABNORMAL HIGH (ref 7.250–7.430)
pO2, Ven: 93 mmHg — ABNORMAL HIGH (ref 32.0–45.0)

## 2019-01-30 LAB — BASIC METABOLIC PANEL
Anion gap: 11 (ref 5–15)
BUN: 14 mg/dL (ref 8–23)
CO2: 27 mmol/L (ref 22–32)
Calcium: 8.7 mg/dL — ABNORMAL LOW (ref 8.9–10.3)
Chloride: 100 mmol/L (ref 98–111)
Creatinine, Ser: 0.96 mg/dL (ref 0.61–1.24)
GFR calc Af Amer: >60 mL/min (ref >60)
GFR calc non Af Amer: >60 mL/min (ref >60)
Glucose, Bld: 231 mg/dL — ABNORMAL HIGH (ref 70–99)
Potassium: 3.3 mmol/L — ABNORMAL LOW (ref 3.5–5.1)
Sodium: 138 mmol/L (ref 135–145)

## 2019-01-30 LAB — CBC WITH DIFFERENTIAL/PLATELET
Abs Immature Granulocytes: 0.53 10*3/uL — ABNORMAL HIGH (ref 0.00–0.07)
Basophils Absolute: 0.1 10*3/uL (ref 0.0–0.1)
Basophils Relative: 0 %
Eosinophils Absolute: 0 10*3/uL (ref 0.0–0.5)
Eosinophils Relative: 0 %
HCT: 38.5 % — ABNORMAL LOW (ref 39.0–52.0)
Hemoglobin: 12.9 g/dL — ABNORMAL LOW (ref 13.0–17.0)
Immature Granulocytes: 3 %
Lymphocytes Relative: 25 %
Lymphs Abs: 4.4 10*3/uL — ABNORMAL HIGH (ref 0.7–4.0)
MCH: 28.4 pg (ref 26.0–34.0)
MCHC: 33.5 g/dL (ref 30.0–36.0)
MCV: 84.6 fL (ref 80.0–100.0)
Monocytes Absolute: 1.3 10*3/uL — ABNORMAL HIGH (ref 0.1–1.0)
Monocytes Relative: 7 %
Neutro Abs: 11.7 10*3/uL — ABNORMAL HIGH (ref 1.7–7.7)
Neutrophils Relative %: 65 %
Platelets: 368 10*3/uL (ref 150–400)
RBC: 4.55 MIL/uL (ref 4.22–5.81)
RDW: 13.5 % (ref 11.5–15.5)
WBC: 17.9 10*3/uL — ABNORMAL HIGH (ref 4.0–10.5)
nRBC: 0.2 % (ref 0.0–0.2)

## 2019-01-30 LAB — BRAIN NATRIURETIC PEPTIDE: B Natriuretic Peptide: 195 pg/mL — ABNORMAL HIGH (ref 0.0–100.0)

## 2019-01-30 LAB — TROPONIN I (HIGH SENSITIVITY): Troponin I (High Sensitivity): 9 ng/L (ref <18)

## 2019-01-30 MED ORDER — OXYMETAZOLINE HCL 0.05 % NA SOLN
1.0000 | Freq: Once | NASAL | Status: AC
  Administered 2019-01-30: 1 via NASAL
  Filled 2019-01-30: qty 30

## 2019-01-30 NOTE — ED Provider Notes (Signed)
[facility] Emergency Department Provider Note       Time seen: ----------------------------------------- 7:29 AM on 01/30/2019 -----------------------------------------   I have reviewed the triage vital signs and the nursing notes.  HISTORY   Chief Complaint No chief complaint on file.    HPI Shane ShirtsRaymond S Capes Jr. is a 73 y.o. male with a history of anxiety, asthma, CHF, COPD, depression, GERD, hyperlipidemia, hypertension, pulmonary fibrosis who presents to the ED for difficulty breathing over the last week.  Patient was recently started on home oxygen, wears 2 L nasal cannula per minute.  Patient states he does not have air conditioning at home and only uses fans to cool his home.  He denies fevers, chills or other complaints.  Past Medical History:  Diagnosis Date  . Anxiety   . Asthma   . Bradycardia   . CHF (congestive heart failure) (HCC)   . Chronic Chest Pain   . COPD (chronic obstructive pulmonary disease) (HCC)   . Coronary artery disease    a. 2008 s/p PCI to LAD;  b. 2013 Cath: patent stent->Med Rx;  c. 11/2013 MV: EF 67%, no ischemia/infarct.  . Depression   . GERD (gastroesophageal reflux disease)   . Heart murmur    a. 02/2010 Echo: EF 55-60%, no rwma, Gr 2 DD, triv MR, mildly dil LA, nl RV.  [name]. Hypercholesteremia   . Hypertension   . Hypertensive heart disease   . Idiopathic pulmonary fibrosis (HCC)    a. Seen by Dr. Dema SeverinMungal 01/2015 and Rx Esbriet - pt cannot afford.  . Other social stressor    a. wife with mental illness    Patient Active Problem List   Diagnosis Date Noted  . Hypertension 04/09/2018  . Pain in limb 04/09/2018  . 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 Benign localized hyperplasia of prostate with urinary obstruction 03/01/2017  . Incomplete emptying of bladder 03/01/2017  . Scrotal pain 09/11/2016  . Oropharyngeal dysphagia 09/21/2015  . Hypertensive heart disease   . Chronic Chest Pain   . Ischemic heart disease   .  Hypercholesteremia   . 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 IPF (idiopathic pulmonary fibrosis) (HCC) 12/23/2014  . Pre-operative cardiovascular examination 11/05/2014  . ILD (interstitial lung disease) (HCC) 09/04/2014  . Interstitial lung disease (HCC) 06/22/2014  . Upper respiratory infection 12/03/2013  . 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 CAROTID BRUIT, LEFT 05/12/2010  . Hyperlipidemia 07/01/2009  . CAD, NATIVE VESSEL 07/01/2009  . SHORTNESS OF BREATH 07/01/2009  . Atypical chest pain 07/01/2009  . Family history of early CAD 01/23/2007  . Anxiety and depression 02/02/2005    Past Surgical History:  Procedure Laterality Date  . CARDIAC CATHETERIZATION    . CORONARY ANGIOPLASTY     stents times 4  . Esophagus stretch    . EYE SURGERY     Bilateral cataracts  . LUNG BIOPSY    . NASAL ENDOSCOPY N/A 12/21/2016   Procedure: NASAL ENDOSCOPY;  Surgeon: Vernie MurdersJuengel, Paul, MD;  [facility];  Service: ENT;  Laterality: N/A;  . TESTICLE SURGERY    . VIDEO ASSISTED THORACOSCOPY (VATS)/THOROCOTOMY Right 11/18/2014   Procedure: VIDEO ASSISTED THORACOSCOPY (VATS)/ wedge resection biopsy;  Surgeon: Hulda Marinimothy Oaks, MD;  Location: [facility] OR[facility];  Service: General;  Laterality: Right;    Allergies Metoprolol succinate, Niacin, and Sertraline hcl  Social History Social History   Tobacco Use  . Smoking status: Former Smoker    Packs/day: 0.50  Years: 1.00    Pack years: 0.50    Types: Cigarettes    Quit date: 05/09/1968    Years since quitting: 50.7  . Smokeless tobacco: Never Used  Substance Use Topics  . Alcohol use: No  . Drug use: No   Review of Systems Constitutional: Negative for fever. Cardiovascular: Negative for chest pain. Respiratory: Positive for shortness of breath Gastrointestinal: Negative for abdominal pain, vomiting and diarrhea. Musculoskeletal: Negative for back pain. Skin: Negative for rash. Neurological:  Negative for headaches, focal weakness or numbness.  All systems negative/normal/unremarkable except as stated in the HPI  ____________________________________________   PHYSICAL EXAM:  VITAL SIGNS: ED Triage Vitals  Enc Vitals Group     BP 01/30/19 0724 (!) 155/88     Pulse Rate 01/30/19 0724 81     Resp 01/30/19 0724 (!) 22     Temp 01/30/19 0724 98.6 F (37 C)     Temp Source 01/30/19 0724 Oral     SpO2 01/30/19 0723 95 %     Weight 01/30/19 0724 158 lb 1.1 oz (71.7 kg)     Height --      Head Circumference --      Peak Flow --      Pain Score 01/30/19 0724 0     Pain Loc --      Pain Edu? --      Excl. in GC? --    Constitutional: Alert and oriented. Well appearing and in no distress. Eyes: Conjunctivae are normal. Normal extraocular movements. ENT      Head: Normocephalic and atraumatic.      Nose: No congestion/rhinnorhea.      Mouth/Throat: Mucous membranes are moist.      Neck: No stridor. Cardiovascular: Normal rate, regular rhythm. No murmurs, rubs, or gallops. Respiratory: Normal respiratory effort without tachypnea nor retractions. Breath sounds are clear and equal bilaterally. No wheezes/rales/rhonchi. Gastrointestinal: Soft and nontender. Normal bowel sounds Musculoskeletal: Nontender with normal range of motion in extremities. No lower extremity tenderness nor edema. Neurologic:  Normal speech and language. No gross focal neurologic deficits are appreciated.  Skin:  Skin is warm, dry and intact. No rash noted. Psychiatric: Mood and affect are normal. Speech and behavior are normal.  ____________________________________________  EKG: Interpreted by me.  Sinus rhythm with rate of 74 bpm, right axis deviation, LVH, normal QT  ____________________________________________  ED COURSE:  As part of my medical decision making, I reviewed the following data within the electronic MEDICAL RECORD NUMBER History obtained from family if available, nursing notes, old chart  and ekg, as well as notes from prior ED visits. Patient presented for dyspnea, we will assess with labs and imaging as indicated at this time.   Procedures  Lummie Morataya. was evaluated in Emergency Department on 01/30/2019 for the symptoms described in the history of present illness. He was evaluated in the context of the global COVID-19 pandemic, which necessitated consideration that the patient might be at risk for infection with the SARS-CoV-2 virus that causes COVID-19. Institutional protocols and algorithms that pertain to the evaluation of patients at risk for COVID-19 are in a state of rapid change based on information released by regulatory bodies including the CDC and federal and state organizations. These policies and algorithms were followed during the patient's care in the ED.  ____________________________________________   LABS (pertinent positives/negatives)  Labs Reviewed  CBC WITH DIFFERENTIAL/PLATELET - Abnormal; Notable for the following components:      Result Value  WBC 17.9 (*)    Hemoglobin 12.9 (*)    HCT 38.5 (*)    Neutro Abs 11.7 (*)    Lymphs Abs 4.4 (*)    Monocytes Absolute 1.3 (*)    Abs Immature Granulocytes 0.53 (*)    All other components within normal limits  BASIC METABOLIC PANEL - Abnormal; Notable for the following components:   Potassium 3.3 (*)    Glucose, Bld 231 (*)    Calcium 8.7 (*)    All other components within normal limits  BLOOD GAS, VENOUS - Abnormal; Notable for the following components:   pH, Ven 7.53 (*)    pCO2, Ven 40 (*)    pO2, Ven 93.0 (*)    Bicarbonate 33.4 (*)    Acid-Base Excess 9.8 (*)    All other components within normal limits  BRAIN NATRIURETIC PEPTIDE  TROPONIN I (HIGH SENSITIVITY)    RADIOLOGY Images were viewed by me  Chest x-ray IMPRESSION: Chronic changes of interstitial lung disease with possible superimposed mild vascular congestion. No new consolidative  changes.  ____________________________________________   DIFFERENTIAL DIAGNOSIS   Pulmonary fibrosis, pneumonia, COPD, anxiety  FINAL ASSESSMENT AND PLAN  Dyspnea   Plan: The patient had presented for shortness of breath. Patient's labs revealed leukocytosis which is likely secondary to recent steroid use as prescribed at [facility]. Patient's imaging revealed mostly chronic findings.  Patient abruptly left the ER without further treatment AGAINST MEDICAL ADVICE.   Laurence Aly, MD    Note: This note was generated in part or whole with voice recognition software. Voice recognition is usually quite accurate but there are transcription errors that can and very often do occur. I apologize for any typographical errors that were not detected and corrected.     Earleen Newport, MD 01/30/19 [phone]

## 2019-01-30 NOTE — ED Notes (Addendum)
Pt is upset, states "no one can help me" pt states he lives in a 74's 5 room home with his 74 yo exwife with one window unit social services gave him and it is still 86 degrees in the home, states he cant breath, states they have 5 fans in the home but it doesn't help. EDP aware.

## 2019-01-30 NOTE — ED Triage Notes (Signed)
Says continues to have sob.  Says tested for covid at [place] and it waas negative.  He has oxygen, but says he cannot breath through nose.  I instructed him to put the oxygen over his mouth if he is mought breathing.  He appears short of breath, but is able to talk in sentences.

## 2019-01-30 NOTE — ED Notes (Signed)
Pt requesting to be unhooked and IV removed, pt states he knows everything will come back negative and that we cant help him. IV removed and pt unhooked from the monitor. Pt ambulatory to the [place].

## 2019-01-30 NOTE — ED Triage Notes (Addendum)
C/O SOB x 1 week.  States recently started on home oxygen.  Wearing 2l/ [place].  Has history of pulmonary fibrosis.  States does not have air conditioning at this time.  Only using fans to cool home.

## 2019-02-01 ENCOUNTER — Other Ambulatory Visit: Payer: Self-pay

## 2019-02-01 ENCOUNTER — Encounter: Payer: Self-pay | Admitting: Emergency Medicine

## 2019-02-01 ENCOUNTER — Emergency Department
Admission: EM | Admit: 2019-02-01 | Discharge: 2019-02-01 | Disposition: A | Discharge status: Home / Self Care | Payer: Medicare Other | Attending: Emergency Medicine | Admitting: Emergency Medicine

## 2019-02-01 ENCOUNTER — Emergency Department: Payer: Medicare Other

## 2019-02-01 DIAGNOSIS — I259 Chronic ischemic heart disease, unspecified: Secondary | ICD-10-CM | POA: Diagnosis not present

## 2019-02-01 DIAGNOSIS — R0600 Dyspnea, unspecified: Secondary | ICD-10-CM | POA: Diagnosis not present

## 2019-02-01 DIAGNOSIS — Z955 Presence of coronary angioplasty implant and graft: Secondary | ICD-10-CM | POA: Diagnosis not present

## 2019-02-01 DIAGNOSIS — J45909 Unspecified asthma, uncomplicated: Secondary | ICD-10-CM | POA: Insufficient documentation

## 2019-02-01 DIAGNOSIS — J449 Chronic obstructive pulmonary disease, unspecified: Secondary | ICD-10-CM | POA: Diagnosis not present

## 2019-02-01 DIAGNOSIS — Z87891 Personal history of nicotine dependence: Secondary | ICD-10-CM | POA: Diagnosis not present

## 2019-02-01 DIAGNOSIS — Z79899 Other long term (current) drug therapy: Secondary | ICD-10-CM | POA: Insufficient documentation

## 2019-02-01 DIAGNOSIS — I110 Hypertensive heart disease with heart failure: Secondary | ICD-10-CM | POA: Insufficient documentation

## 2019-02-01 DIAGNOSIS — Z7982 Long term (current) use of aspirin: Secondary | ICD-10-CM | POA: Insufficient documentation

## 2019-02-01 DIAGNOSIS — I509 Heart failure, unspecified: Secondary | ICD-10-CM | POA: Insufficient documentation

## 2019-02-01 DIAGNOSIS — R0602 Shortness of breath: Secondary | ICD-10-CM | POA: Diagnosis present

## 2019-02-01 LAB — BASIC METABOLIC PANEL
Anion gap: 12 (ref 5–15)
BUN: 22 mg/dL (ref 8–23)
CO2: 24 mmol/L (ref 22–32)
Calcium: 8.6 mg/dL — ABNORMAL LOW (ref 8.9–10.3)
Chloride: 101 mmol/L (ref 98–111)
Creatinine, Ser: 1.16 mg/dL (ref 0.61–1.24)
GFR calc Af Amer: >60 mL/min (ref >60)
GFR calc non Af Amer: >60 mL/min (ref >60)
Glucose, Bld: 251 mg/dL — ABNORMAL HIGH (ref 70–99)
Potassium: 3.5 mmol/L (ref 3.5–5.1)
Sodium: 137 mmol/L (ref 135–145)

## 2019-02-01 LAB — CBC WITH DIFFERENTIAL/PLATELET
Abs Immature Granulocytes: 0.15 10*3/uL — ABNORMAL HIGH (ref 0.00–0.07)
Basophils Absolute: 0 10*3/uL (ref 0.0–0.1)
Basophils Relative: 0 %
Eosinophils Absolute: 0.1 10*3/uL (ref 0.0–0.5)
Eosinophils Relative: 1 %
HCT: 42.4 % (ref 39.0–52.0)
Hemoglobin: 14.1 g/dL (ref 13.0–17.0)
Immature Granulocytes: 1 %
Lymphocytes Relative: 15 %
Lymphs Abs: 2.5 10*3/uL (ref 0.7–4.0)
MCH: 28.3 pg (ref 26.0–34.0)
MCHC: 33.3 g/dL (ref 30.0–36.0)
MCV: 85 fL (ref 80.0–100.0)
Monocytes Absolute: 1.1 10*3/uL — ABNORMAL HIGH (ref 0.1–1.0)
Monocytes Relative: 6 %
Neutro Abs: 13.1 10*3/uL — ABNORMAL HIGH (ref 1.7–7.7)
Neutrophils Relative %: 77 %
Platelets: 354 10*3/uL (ref 150–400)
RBC: 4.99 MIL/uL (ref 4.22–5.81)
RDW: 14.4 % (ref 11.5–15.5)
WBC: 16.9 10*3/uL — ABNORMAL HIGH (ref 4.0–10.5)
nRBC: 0 % (ref 0.0–0.2)

## 2019-02-01 LAB — TROPONIN I (HIGH SENSITIVITY): Troponin I (High Sensitivity): 7 ng/L (ref <18)

## 2019-02-01 LAB — BRAIN NATRIURETIC PEPTIDE: B Natriuretic Peptide: 33 pg/mL (ref 0.0–100.0)

## 2019-02-01 MED ORDER — BENZONATATE 100 MG PO CAPS: 100.0000 mg | ORAL_CAPSULE | Freq: Three times a day (TID) | ORAL | 1 refills | Status: DC | PRN

## 2019-02-01 NOTE — ED Notes (Signed)
Pt states has been seen 4x for feeling sob at night since his nasal reconstruction surgery in January. Has attempted to reach his pulmonologist with no success. Has not tried to see his surgeon for a follow up. Uses oxygen at night and prn for days. No current feelings of sob

## 2019-02-01 NOTE — ED Provider Notes (Signed)
[facility] Emergency Department Provider Note   ____________________________________________   First MD Initiated Contact with Patient 02/01/19 1636     (approximate)  I have reviewed the triage vital signs and the nursing notes.   HISTORY  Chief Complaint Shortness of Breath    HPI Shane Reid. is a 73 y.o. male who has had multiple hospital visits in the last week or 2.  He complains of increasing coughing and trouble breathing when he lays down.  During the day he says he is okay.  He gets occasional cough but Tessalon Perles are taking care of it.  He is got a prescription for Zithromax which she is still taking and for acyclovir for shingles which is resolving all the lesions are now scabbed.  He says he is running out of his Tessalon now and he is having trouble getting ahold of his pulmonologist.  He says he has paged or called his pulmonologist office twice in the last month or so and has never gotten a return phone call.  He says a pulmonologist here had asked him to take an experimental drug and he did not want to see the pulmonologist could not see him.  He said the same thing happened to a pulmonologist in [place].  There is a note in the chart about him not being able to afford a medicine. Patient's main complaints now seem to be difficulty getting enough sleep and worried about being running out of his Tessalon.  He had a CT on the 29th of last month that look like usual interstitial pneumonia.  As I understand he is getting medications for this.  I discussed his case with Dr. Liliane Channel of our pulmonologist.  He will arrange follow-up for this gentleman assuming his BNP and troponin are okay.     Past Medical History:  Diagnosis Date  . Anxiety   . Asthma   . Bradycardia   . CHF (congestive heart failure) (HCC)   . Chronic Chest Pain   . COPD (chronic obstructive pulmonary disease) (HCC)   . Coronary artery disease    a. 2008 s/p  PCI to LAD;  b. 2013 Cath: patent stent->Med Rx;  c. 11/2013 MV: EF 67%, no ischemia/infarct.  . Depression   . GERD (gastroesophageal reflux disease)   . Heart murmur    a. 02/2010 Echo: EF 55-60%, no rwma, Gr 2 DD, triv MR, mildly dil LA, nl RV.  [name] Hypercholesteremia   . Hypertension   . Hypertensive heart disease   . Idiopathic pulmonary fibrosis (HCC)    a. Seen by Dr. Dema Severin 01/2015 and Rx Esbriet - pt cannot afford.  . Other social stressor    a. wife with mental illness    Patient Active Problem List   Diagnosis Date Noted  . Hypertension 04/09/2018  . Pain in limb 04/09/2018  . 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 Benign localized hyperplasia of prostate with urinary obstruction 03/01/2017  . Incomplete emptying of bladder 03/01/2017  . Scrotal pain 09/11/2016  . Oropharyngeal dysphagia 09/21/2015  . Hypertensive heart disease   . Chronic Chest Pain   . Ischemic heart disease   . Hypercholesteremia   . 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 IPF (idiopathic pulmonary fibrosis) (HCC) 12/23/2014  . Pre-operative cardiovascular examination 11/05/2014  . ILD (interstitial lung disease) (HCC) 09/04/2014  . Interstitial lung disease (HCC) 06/22/2014  . Upper respiratory infection 12/03/2013  . 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  CAROTID BRUIT, LEFT 05/12/2010  . Hyperlipidemia 07/01/2009  . CAD, NATIVE VESSEL 07/01/2009  . SHORTNESS OF BREATH 07/01/2009  . Atypical chest pain 07/01/2009  . Family history of early CAD 01/23/2007  . Anxiety and depression 02/02/2005    Past Surgical History:  Procedure Laterality Date  . CARDIAC CATHETERIZATION    . CORONARY ANGIOPLASTY     stents times 4  . Esophagus stretch    . EYE SURGERY     Bilateral cataracts  . LUNG BIOPSY    . NASAL ENDOSCOPY N/A 12/21/2016   Procedure: NASAL ENDOSCOPY;  Surgeon: Vernie MurdersJuengel, Tivis Wherry, MD;  [facility];  Service: ENT;  Laterality: N/A;  . TESTICLE  SURGERY    . VIDEO ASSISTED THORACOSCOPY (VATS)/THOROCOTOMY Right 11/18/2014   Procedure: VIDEO ASSISTED THORACOSCOPY (VATS)/ wedge resection biopsy;  Surgeon: Hulda Marinimothy Oaks, MD;  Location: [facility];  Service: General;  Laterality: Right;    Prior to Admission medications   Medication Sig Start Date End Date Taking? Authorizing Provider  acyclovir (ZOVIRAX) 800 MG tablet Take 800 mg by mouth 5 (five) times daily. For 7 days 01/23/19   [provider]  albuterol (PROVENTIL) (2.5 MG/3ML) 0.083% nebulizer solution Take 3 mLs (2.5 mg total) by nebulization every 6 (six) hours as needed. 05/02/12   Antonieta IbaGollan, [name], MD  albuterol (VENTOLIN HFA) 108 (90 Base) MCG/ACT inhaler Inhale 2 puffs into the lungs every 4 (four) hours as needed for wheezing. 01/22/06   [provider]  amLODipine-benazepril (LOTREL) 5-10 MG capsule TAKE 1 CAPSULE BY MOUTH EVERY DAY 05/20/18   Antonieta IbaGollan, [name], MD  aspirin EC 81 MG tablet Take 81 mg by mouth every morning. 08/06/12   [provider]  benzonatate (TESSALON PERLES) 100 MG capsule Take 1 capsule (100 mg total) by mouth 3 (three) times daily as needed for cough. 02/01/19 02/01/20  [name], Ahman Dugdale F, MD  benzonatate (TESSALON) 100 MG capsule Take 100-200 mg by mouth 3 (three) times daily as needed for cough. 01/22/19   [provider]  gabapentin (NEURONTIN) 300 MG capsule TAKE 1 TAB BY MOUTH 3 TIMES A DAY FOR NERVE PAIN 03/07/18   [provider]  Multiple Vitamins-Minerals (CENTRUM SILVER PO) Take by mouth daily.    [provider]  nitroGLYCERIN (NITROSTAT) 0.4 MG SL tablet Place 1 tablet (0.4 mg total) under the tongue every 5 (five) minutes as needed. 07/30/17   Antonieta IbaGollan, [name], MD  nystatin (MYCOSTATIN/NYSTOP) powder Apply 1 application topically 4 (four) times daily. 01/10/19   [provider]  pantoprazole (PROTONIX) 40 MG tablet Take 40 mg by mouth daily. 12/19/18   [provider]  predniSONE  (DELTASONE) 20 MG tablet Take 20-60 mg by mouth daily. 01/25/19   [provider]  rosuvastatin (CRESTOR) 10 MG tablet Take 1 tablet (10 mg total) by mouth daily. 09/05/18   Antonieta IbaGollan, [name], MD  traMADol (ULTRAM) 50 MG tablet Take 1 tablet (50 mg total) by mouth every 6 (six) hours as needed. Patient not taking: Reported on 09/05/2018 03/24/18   Faythe GheeFisher, Susan W, PA-C    Allergies Metoprolol succinate, Niacin, and Sertraline hcl  Family History  Problem Relation Age of Onset  . Coronary artery disease Mother        s/p CABG  . Heart failure Mother   . Heart disease Other   . Depression Other     Social History Social History   Tobacco Use  . Smoking status: Former Smoker  Packs/day: 0.50    Years: 1.00    Pack years: 0.50    Types: Cigarettes    Quit date: 05/09/1968    Years since quitting: 50.7  . Smokeless tobacco: Never Used  Substance Use Topics  . Alcohol use: No  . Drug use: No    Review of Systems  Constitutional: No fever/chills Eyes: No visual changes. ENT: No sore throat. Cardiovascular: Denies chest pain. Respiratory: Chronic shortness of breath. Gastrointestinal: No abdominal pain.  No nausea, no vomiting.  No diarrhea.  No constipation. Genitourinary: Negative for dysuria. Musculoskeletal: Negative for back pain. Skin: Negative for rash. Neurological: Negative for headaches, focal weakness   ____________________________________________   PHYSICAL EXAM:  VITAL SIGNS: ED Triage Vitals [02/01/19 1143]  Enc Vitals Group     BP 102/77     Pulse Rate 77     Resp 16     Temp      Temp Source Oral     SpO2 97 %     Weight 158 lb 1.1 oz (71.7 kg)     Height 5\' 9"  (1.753 m)     Head Circumference      Peak Flow      Pain Score 0     Pain Loc      Pain Edu?      Excl. in GC?    Constitutional: Alert and oriented. Well appearing and in no acute distress. Eyes: Conjunctivae are normal.  Head: Atraumatic. Nose: No congestion/rhinnorhea.  Mouth/Throat: Mucous membranes are moist.  Oropharynx non-erythematous. Neck: No stridor.   Cardiovascular: Normal rate, regular rhythm. Grossly normal heart sounds.  Good peripheral circulation. Respiratory: Normal respiratory effort.  No retractions. Lungs scattered crackles especially in the right base Gastrointestinal: Soft and nontender. No distention. No abdominal bruits. No CVA tenderness. Musculoskeletal: No lower extremity tenderness nor edema.   Neurologic:  Normal speech and language. No gross focal neurologic deficits are appreciated. No gait instability. Skin:  Skin is warm, dry and intact. No rash noted.   ____________________________________________   LABS (all labs ordered are listed, but only abnormal results are displayed)  Labs Reviewed  CBC WITH DIFFERENTIAL/PLATELET - Abnormal; Notable for the following components:      Result Value   WBC 16.9 (*)    Neutro Abs 13.1 (*)    Monocytes Absolute 1.1 (*)    Abs Immature Granulocytes 0.15 (*)    All other components within normal limits  BASIC METABOLIC PANEL - Abnormal; Notable for the following components:   Glucose, Bld 251 (*)    Calcium 8.6 (*)    All other components within normal limits  BRAIN NATRIURETIC PEPTIDE  TROPONIN I (HIGH SENSITIVITY)  TROPONIN I (HIGH SENSITIVITY)   ____________________________________________  EKG  EKG read interpreted by me shows normal sinus rhythm rate of 76 normal axis no acute changes there is a short PR. ____________________________________________  RADIOLOGY  ED MD interpretation: Chest x-ray read by radiology reviewed by me shows unchanged chronic fibrosis  Official radiology report(s): Dg Chest 2 View  Result Date: 02/01/2019 CLINICAL DATA:  Short of breath. EXAM: CHEST - 2 VIEW COMPARISON:  01/30/2019 FINDINGS: Heart size is normal. No pleural effusions identified. Diffuse bilateral interstitial reticulation and bronchitic changes are again noted throughout both  lungs reflecting chronic interstitial lung disease. No superimposed airspace consolidation or atelectasis. IMPRESSION: 1. Unchanged appearance of chronic fibrotic interstitial lung disease without superimposed acute cardiopulmonary abnormality. Electronically Signed   By: Signa Kellaylor  Stroud M.D.   On:  02/01/2019 13:11    ____________________________________________   PROCEDURES  Procedure(s) performed (including Critical Care):  Procedures   ____________________________________________   INITIAL IMPRESSION / ASSESSMENT AND PLAN / ED COURSE  Patient will follow-up with pulmonary after [place] Day.  He does not have an increasing oxygen requirement.  BNP and troponin are negative.  I will let him go he will return if he is worse.  I have refilled his Tessalon as well.             ____________________________________________   FINAL CLINICAL IMPRESSION(S) / ED DIAGNOSES  Final diagnoses:  Dyspnea, unspecified type     ED Discharge Orders         Ordered    benzonatate (TESSALON PERLES) 100 MG capsule  3 times daily PRN     02/01/19 1906           Note:  This document was prepared using Dragon voice recognition software and may include unintentional dictation errors.    Nena Polio, MD 02/01/19 [phone]

## 2019-02-01 NOTE — Discharge Instructions (Addendum)
Please return for fever or increasing shortness of breath or oxygen requirement or feeling sicker.  I have refilled your Tessalon for you.  Dr.Aleskerove, the pulmonologist will have his office give you a call immediately after Labor Day.  If they do not you can call their office I have given you the number.

## 2019-02-01 NOTE — ED Triage Notes (Signed)
Pt to ED via POV c/o Shortness of breath. Pt states that he has hx/o pulmonary fibrosis. Pt states that he is not short of breath during the day, only when he lays down at night. Pt states states that he does not have AC at home, only uses fans. Pt is on Chronic O2 at home on 2 Liter via [place], pt states that he uses 3 liters at home. Pt had O2 cut off while in triage, pt SpO2 was 97% on room air.

## 2019-02-10 ENCOUNTER — Other Ambulatory Visit: Payer: Self-pay

## 2019-02-10 ENCOUNTER — Emergency Department
Admission: EM | Admit: 2019-02-10 | Discharge: 2019-02-10 | Disposition: A | Discharge status: Home / Self Care | Payer: Medicare Other | Attending: Emergency Medicine | Admitting: Emergency Medicine

## 2019-02-10 DIAGNOSIS — Z87891 Personal history of nicotine dependence: Secondary | ICD-10-CM | POA: Diagnosis not present

## 2019-02-10 DIAGNOSIS — B0223 Postherpetic polyneuropathy: Secondary | ICD-10-CM | POA: Diagnosis not present

## 2019-02-10 DIAGNOSIS — Z7982 Long term (current) use of aspirin: Secondary | ICD-10-CM | POA: Diagnosis not present

## 2019-02-10 DIAGNOSIS — I110 Hypertensive heart disease with heart failure: Secondary | ICD-10-CM | POA: Insufficient documentation

## 2019-02-10 DIAGNOSIS — I509 Heart failure, unspecified: Secondary | ICD-10-CM | POA: Diagnosis not present

## 2019-02-10 DIAGNOSIS — Z79899 Other long term (current) drug therapy: Secondary | ICD-10-CM | POA: Diagnosis not present

## 2019-02-10 DIAGNOSIS — J45909 Unspecified asthma, uncomplicated: Secondary | ICD-10-CM | POA: Diagnosis not present

## 2019-02-10 DIAGNOSIS — J449 Chronic obstructive pulmonary disease, unspecified: Secondary | ICD-10-CM | POA: Insufficient documentation

## 2019-02-10 DIAGNOSIS — I2510 Atherosclerotic heart disease of native coronary artery without angina pectoris: Secondary | ICD-10-CM | POA: Insufficient documentation

## 2019-02-10 MED ORDER — TRIAMCINOLONE ACETONIDE 0.1 % EX CREA: 1.0000 "application " | TOPICAL_CREAM | Freq: Four times a day (QID) | CUTANEOUS | 1 refills | Status: DC

## 2019-02-10 MED ORDER — LIDOCAINE 5 % EX OINT: 1.0000 "application " | TOPICAL_OINTMENT | CUTANEOUS | 2 refills | Status: DC | PRN

## 2019-02-10 NOTE — ED Provider Notes (Signed)
[facility] Emergency Department Provider Note  ____________________________________________  Time seen: Approximately 9:19 PM  I have reviewed the triage vital signs and the nursing notes.   HISTORY  Chief Complaint Wound Check    HPI Shane ShirtsRaymond S Vogan Jr. is a 73 y.o. male who presents the emergency department requesting a "topical" medicine for his shingles.  Patient was diagnosed with shingles several weeks prior.  He reports that most of the lesions are well-healed but he continues to have pain as well as itching in the same skin dermatome.  Patient was seen in urgent care today, started on gabapentin, famotidine, prednisone for symptom relief.  Patient reports that he is taken a dose of each of the medications but he really wants a "topical" for his complaints.  No other complaints at this time.  Patient has a significant medical history as described below.  No complaints with chronic medical problems.         Past Medical History:  Diagnosis Date  . Anxiety   . Asthma   . Bradycardia   . CHF (congestive heart failure) (HCC)   . Chronic Chest Pain   . COPD (chronic obstructive pulmonary disease) (HCC)   . Coronary artery disease    a. 2008 s/p PCI to LAD;  b. 2013 Cath: patent stent->Med Rx;  c. 11/2013 MV: EF 67%, no ischemia/infarct.  . Depression   . GERD (gastroesophageal reflux disease)   . Heart murmur    a. 02/2010 Echo: EF 55-60%, no rwma, Gr 2 DD, triv MR, mildly dil LA, nl RV.  [name]. Hypercholesteremia   . Hypertension   . Hypertensive heart disease   . Idiopathic pulmonary fibrosis (HCC)    a. Seen by Dr. Dema SeverinMungal 01/2015 and Rx Esbriet - pt cannot afford.  . Other social stressor    a. wife with mental illness    Patient Active Problem List   Diagnosis Date Noted  . Hypertension 04/09/2018  . Pain in limb 04/09/2018  . 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 Benign localized hyperplasia of prostate with urinary obstruction 03/01/2017  .  Incomplete emptying of bladder 03/01/2017  . Scrotal pain 09/11/2016  . Oropharyngeal dysphagia 09/21/2015  . Hypertensive heart disease   . Chronic Chest Pain   . Ischemic heart disease   . Hypercholesteremia   . 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 IPF (idiopathic pulmonary fibrosis) (HCC) 12/23/2014  . Pre-operative cardiovascular examination 11/05/2014  . ILD (interstitial lung disease) (HCC) 09/04/2014  . Interstitial lung disease (HCC) 06/22/2014  . Upper respiratory infection 12/03/2013  . 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 CAROTID BRUIT, LEFT 05/12/2010  . Hyperlipidemia 07/01/2009  . CAD, NATIVE VESSEL 07/01/2009  . SHORTNESS OF BREATH 07/01/2009  . Atypical chest pain 07/01/2009  . Family history of early CAD 01/23/2007  . Anxiety and depression 02/02/2005    Past Surgical History:  Procedure Laterality Date  . CARDIAC CATHETERIZATION    . CORONARY ANGIOPLASTY     stents times 4  . Esophagus stretch    . EYE SURGERY     Bilateral cataracts  . LUNG BIOPSY    . NASAL ENDOSCOPY N/A 12/21/2016   Procedure: NASAL ENDOSCOPY;  Surgeon: Vernie MurdersJuengel, Paul, MD;  [facility];  Service: ENT;  Laterality: N/A;  . TESTICLE SURGERY    . VIDEO ASSISTED THORACOSCOPY (VATS)/THOROCOTOMY Right 11/18/2014   Procedure: VIDEO ASSISTED THORACOSCOPY (VATS)/ wedge resection biopsy;  Surgeon: Hulda Marinimothy Oaks, MD;  Location: [facility] ORS;  Service: General;  Laterality: Right;    Prior to Admission medications   Medication Sig Start Date End Date Taking? Authorizing Provider  acyclovir (ZOVIRAX) 800 MG tablet Take 800 mg by mouth 5 (five) times daily. For 7 days 01/23/19   [provider]  albuterol (PROVENTIL) (2.5 MG/3ML) 0.083% nebulizer solution Take 3 mLs (2.5 mg total) by nebulization every 6 (six) hours as needed. 05/02/12   Antonieta Iba, MD  albuterol (VENTOLIN HFA) 108 (90 Base) MCG/ACT inhaler Inhale 2 puffs into the lungs  every 4 (four) hours as needed for wheezing. 01/22/06   [provider]  amLODipine-benazepril (LOTREL) 5-10 MG capsule TAKE 1 CAPSULE BY MOUTH EVERY DAY 05/20/18   Antonieta Iba, MD  aspirin EC 81 MG tablet Take 81 mg by mouth every morning. 08/06/12   [provider]  benzonatate (TESSALON PERLES) 100 MG capsule Take 1 capsule (100 mg total) by mouth 3 (three) times daily as needed for cough. 02/01/19 02/01/20  Arnaldo Natal, MD  benzonatate (TESSALON) 100 MG capsule Take 100-200 mg by mouth 3 (three) times daily as needed for cough. 01/22/19   [provider]  gabapentin (NEURONTIN) 300 MG capsule TAKE 1 TAB BY MOUTH 3 TIMES A DAY FOR NERVE PAIN 03/07/18   [provider]  lidocaine (XYLOCAINE) 5 % ointment Apply 1 application topically as needed. 02/10/19   Aidynn Polendo, Delorise Royals, PA-C  Multiple Vitamins-Minerals (CENTRUM SILVER PO) Take by mouth daily.    [provider]  nitroGLYCERIN (NITROSTAT) 0.4 MG SL tablet Place 1 tablet (0.4 mg total) under the tongue every 5 (five) minutes as needed. 07/30/17   Antonieta Iba, MD  nystatin (MYCOSTATIN/NYSTOP) powder Apply 1 application topically 4 (four) times daily. 01/10/19   [provider]  pantoprazole (PROTONIX) 40 MG tablet Take 40 mg by mouth daily. 12/19/18   [provider]  predniSONE (DELTASONE) 20 MG tablet Take 20-60 mg by mouth daily. 01/25/19   [provider]  rosuvastatin (CRESTOR) 10 MG tablet Take 1 tablet (10 mg total) by mouth daily. 09/05/18   Antonieta Iba, MD  traMADol (ULTRAM) 50 MG tablet Take 1 tablet (50 mg total) by mouth every 6 (six) hours as needed. Patient not taking: Reported on 09/05/2018 03/24/18   Faythe Ghee, PA-C  triamcinolone cream (KENALOG) 0.1 % Apply 1 application topically 4 (four) times daily. 02/10/19   Effa Yarrow, Delorise Royals, PA-C    Allergies Metoprolol succinate, Niacin, and Sertraline hcl  Family History  Problem Relation Age  of Onset  . Coronary artery disease Mother        s/p CABG  . Heart failure Mother   . Heart disease Other   . Depression Other     Social History Social History   Tobacco Use  . Smoking status: Former Smoker    Packs/day: 0.50    Years: 1.00    Pack years: 0.50    Types: Cigarettes    Quit date: 05/09/1968    Years since quitting: 50.7  . Smokeless tobacco: Never Used  Substance Use Topics  . Alcohol use: No  . Drug use: No     Review of Systems  Constitutional: No fever/chills Eyes: No visual changes. No discharge ENT: No upper respiratory complaints. Cardiovascular: no chest pain. Respiratory: no cough. No SOB. Gastrointestinal: No abdominal pain.  No nausea, no vomiting.  No diarrhea.  No constipation. Musculoskeletal: Negative for musculoskeletal pain. Skin: Positive for ongoing burning and itching along  skin dermatome from shingles Neurological: Negative for headaches, focal weakness or numbness. 10-point ROS otherwise negative.  ____________________________________________   PHYSICAL EXAM:  VITAL SIGNS: ED Triage Vitals [02/10/19 2041]  Enc Vitals Group     BP 138/88     Pulse Rate 78     Resp 20     Temp 98.2 F (36.8 C)     Temp Source Oral     SpO2 96 %     Weight 151 lb (68.5 kg)     Height 5\' 9"  (1.753 m)     Head Circumference      Peak Flow      Pain Score 8     Pain Loc      Pain Edu?      Excl. in [place]?      Constitutional: Alert and oriented. Well appearing and in no acute distress. Eyes: Conjunctivae are normal. PERRL. EOMI. Head: Atraumatic. ENT:      Ears:       Nose: No congestion/rhinnorhea.      Mouth/Throat: Mucous membranes are moist.  Neck: No stridor.    Cardiovascular: Normal rate, regular rhythm. Normal S1 and S2.  Good peripheral circulation. Respiratory: Normal respiratory effort without tachypnea or retractions. Lungs CTAB. Good air entry to the bases with no decreased or absent breath sounds. Musculoskeletal:  Full range of motion to all extremities. No gross deformities appreciated. Neurologic:  Normal speech and language. No gross focal neurologic deficits are appreciated.  Skin:  Skin is warm, dry and intact. No rash noted.  Patient has a few scabbed lesions, other well-healed lesions along a dermatomal distribution left neck and shoulder region.  No signs of active shingle infection. Psychiatric: Mood and affect are normal. Speech and behavior are normal. Patient exhibits appropriate insight and judgement.   ____________________________________________   LABS (all labs ordered are listed, but only abnormal results are displayed)  Labs Reviewed - No data to display ____________________________________________  EKG   ____________________________________________  RADIOLOGY   No results found.  ____________________________________________    PROCEDURES  Procedure(s) performed:    Procedures    Medications - No data to display   ____________________________________________   INITIAL IMPRESSION / ASSESSMENT AND PLAN / ED COURSE  Pertinent labs & imaging results that were available during my care of the patient were reviewed by me and considered in my medical decision making (see chart for details).  Review of the [place] CSRS was performed in accordance of the [place] prior to dispensing any controlled drugs.           Patient's diagnosis is consistent with pain from shingles.  Patient was diagnosed with shingles several weeks ago.  Skin findings have largely healed but patient continues to have a burning and itching sensation along the same dermatome.  Patient requested topical medications for symptom relief.  He was seen in urgent care today given prednisone, gabapentin, famotidine for his symptoms.  Patient is requesting topicals which I will provide with topical lidocaine as well as topical triamcinolone.  Follow-up primary care as needed..  Patient is given ED precautions  to return to the ED for any worsening or new symptoms.     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 Despite best efforts to proofread, errors can occur which can change the meaning. Any change was purely unintentional.    Racheal PatchesCuthriell, Merion Caton D, PA-C 02/10/19 2125    [name], [name], MD 02/10/19 [phone]

## 2019-02-10 NOTE — ED Triage Notes (Signed)
Pt here for wound check. States had shingles at the end of august and has scabbed small areas that he wants looked at. States he went to urgent care today and was given prednisone and gabapentin, states took first dose of each.

## 2019-02-13 ENCOUNTER — Encounter: Payer: Self-pay | Admitting: Emergency Medicine

## 2019-02-13 ENCOUNTER — Other Ambulatory Visit: Payer: Self-pay

## 2019-02-13 ENCOUNTER — Emergency Department
Admission: EM | Admit: 2019-02-13 | Discharge: 2019-02-13 | Disposition: A | Discharge status: Home / Self Care | Payer: Medicare Other | Attending: Emergency Medicine | Admitting: Emergency Medicine

## 2019-02-13 DIAGNOSIS — B0223 Postherpetic polyneuropathy: Secondary | ICD-10-CM | POA: Insufficient documentation

## 2019-02-13 DIAGNOSIS — I509 Heart failure, unspecified: Secondary | ICD-10-CM | POA: Insufficient documentation

## 2019-02-13 DIAGNOSIS — Z7982 Long term (current) use of aspirin: Secondary | ICD-10-CM | POA: Insufficient documentation

## 2019-02-13 DIAGNOSIS — M542 Cervicalgia: Secondary | ICD-10-CM | POA: Diagnosis present

## 2019-02-13 DIAGNOSIS — I110 Hypertensive heart disease with heart failure: Secondary | ICD-10-CM | POA: Insufficient documentation

## 2019-02-13 DIAGNOSIS — J449 Chronic obstructive pulmonary disease, unspecified: Secondary | ICD-10-CM | POA: Insufficient documentation

## 2019-02-13 DIAGNOSIS — Z79899 Other long term (current) drug therapy: Secondary | ICD-10-CM | POA: Diagnosis not present

## 2019-02-13 DIAGNOSIS — Z87891 Personal history of nicotine dependence: Secondary | ICD-10-CM | POA: Diagnosis not present

## 2019-02-13 DIAGNOSIS — I2510 Atherosclerotic heart disease of native coronary artery without angina pectoris: Secondary | ICD-10-CM | POA: Insufficient documentation

## 2019-02-13 MED ORDER — TRAMADOL HCL 50 MG PO TABS: 50.0000 mg | ORAL_TABLET | Freq: Two times a day (BID) | ORAL | 0 refills | Status: DC | PRN

## 2019-02-13 NOTE — ED Notes (Signed)
See triage note  Presents with cont's pain from shingles

## 2019-02-13 NOTE — ED Provider Notes (Signed)
[facility] Emergency Department Provider Note   ____________________________________________   None    (approximate)  I have reviewed the triage vital signs and the nursing notes.   HISTORY  Chief Complaint Neck Pain    HPI Shane Reid. is a 73 y.o. male patient presents 3 weeks post shingles.  Patient still continues have pain at the operative site.  Patient states pain is refractory to steroids, topical applications of lidocaine, and Lidoderm patches.  Patient described the pain as "burning.  Patient is unable to sleep secondary to pain.  Patient rates pain as a 10/10.         Past Medical History:  Diagnosis Date  . Anxiety   . Asthma   . Bradycardia   . CHF (congestive heart failure) ([place])   . Chronic Chest Pain   . COPD (chronic obstructive pulmonary disease) ([place])   . Coronary artery disease    a. 2008 s/p PCI to LAD;  b. 2013 Cath: patent stent->Med Rx;  c. 11/2013 MV: EF 67%, no ischemia/infarct.  . Depression   . GERD (gastroesophageal reflux disease)   . Heart murmur    a. 02/2010 Echo: EF 55-60%, no rwma, Gr 2 DD, triv MR, mildly dil LA, nl RV.  [name] Hypercholesteremia   . Hypertension   . Hypertensive heart disease   . Idiopathic pulmonary fibrosis ([place])    a. Seen by Dr. Stevenson Clinch 01/2015 and Rx Esbriet - pt cannot afford.  . Other social stressor    a. wife with mental illness    Patient Active Problem List   Diagnosis Date Noted  . Hypertension 04/09/2018  . Pain in limb 04/09/2018  . 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 Benign localized hyperplasia of prostate with urinary obstruction 03/01/2017  . Incomplete emptying of bladder 03/01/2017  . Scrotal pain 09/11/2016  . Oropharyngeal dysphagia 09/21/2015  . Hypertensive heart disease   . Chronic Chest Pain   . Ischemic heart disease   . Hypercholesteremia   . 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 IPF (idiopathic pulmonary  fibrosis) ([place]) 12/23/2014  . Pre-operative cardiovascular examination 11/05/2014  . ILD (interstitial lung disease) ([place]) 09/04/2014  . Interstitial lung disease ([place]) 06/22/2014  . Upper respiratory infection 12/03/2013  . 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 CAROTID BRUIT, LEFT 05/12/2010  . Hyperlipidemia 07/01/2009  . CAD, NATIVE VESSEL 07/01/2009  . SHORTNESS OF BREATH 07/01/2009  . Atypical chest pain 07/01/2009  . Family history of early CAD 01/23/2007  . Anxiety and depression 02/02/2005    Past Surgical History:  Procedure Laterality Date  . CARDIAC CATHETERIZATION    . CORONARY ANGIOPLASTY     stents times 4  . Esophagus stretch    . EYE SURGERY     Bilateral cataracts  . LUNG BIOPSY    . NASAL ENDOSCOPY N/A 12/21/2016   Procedure: NASAL ENDOSCOPY;  Surgeon: Margaretha Sheffield, MD;  Location: [facility];  Service: ENT;  Laterality: N/A;  . TESTICLE SURGERY    . VIDEO ASSISTED THORACOSCOPY (VATS)/THOROCOTOMY Right 11/18/2014   Procedure: VIDEO ASSISTED THORACOSCOPY (VATS)/ wedge resection biopsy;  Surgeon: Nestor Lewandowsky, MD;  Location: [facility];  Service: General;  Laterality: Right;    Prior to Admission medications   Medication Sig Start Date End Date Taking? Authorizing Provider  acyclovir (ZOVIRAX) 800 MG tablet Take 800 mg by mouth 5 (five) times daily. For 7 days 01/23/19   [provider]  albuterol (PROVENTIL) (2.5  MG/3ML) 0.083% nebulizer solution Take 3 mLs (2.5 mg total) by nebulization every 6 (six) hours as needed. 05/02/12   Antonieta IbaGollan, [name], MD  albuterol (VENTOLIN HFA) 108 (90 Base) MCG/ACT inhaler Inhale 2 puffs into the lungs every 4 (four) hours as needed for wheezing. 01/22/06   [provider]  amLODipine-benazepril (LOTREL) 5-10 MG capsule TAKE 1 CAPSULE BY MOUTH EVERY DAY 05/20/18   Antonieta IbaGollan, [name], MD  aspirin EC 81 MG tablet Take 81 mg by mouth every morning. 08/06/12   [provider]  benzonatate (TESSALON PERLES) 100  MG capsule Take 1 capsule (100 mg total) by mouth 3 (three) times daily as needed for cough. 02/01/19 02/01/20  [name], [name], MD  benzonatate (TESSALON) 100 MG capsule Take 100-200 mg by mouth 3 (three) times daily as needed for cough. 01/22/19   [provider]  gabapentin (NEURONTIN) 300 MG capsule TAKE 1 TAB BY MOUTH 3 TIMES A DAY FOR NERVE PAIN 03/07/18   [provider]  lidocaine (XYLOCAINE) 5 % ointment Apply 1 application topically as needed. 02/10/19   [name], [name], PA-C  Multiple Vitamins-Minerals (CENTRUM SILVER PO) Take by mouth daily.    [provider]  nitroGLYCERIN (NITROSTAT) 0.4 MG SL tablet Place 1 tablet (0.4 mg total) under the tongue every 5 (five) minutes as needed. 07/30/17   Antonieta IbaGollan, [name], MD  nystatin (MYCOSTATIN/NYSTOP) powder Apply 1 application topically 4 (four) times daily. 01/10/19   [provider]  pantoprazole (PROTONIX) 40 MG tablet Take 40 mg by mouth daily. 12/19/18   [provider]  predniSONE (DELTASONE) 20 MG tablet Take 20-60 mg by mouth daily. 01/25/19   [provider]  rosuvastatin (CRESTOR) 10 MG tablet Take 1 tablet (10 mg total) by mouth daily. 09/05/18   Antonieta IbaGollan, [name], MD  traMADol (ULTRAM) 50 MG tablet Take 1 tablet (50 mg total) by mouth every 12 (twelve) hours as needed for moderate pain. 02/13/19   [name], [name], PA-C  triamcinolone cream (KENALOG) 0.1 % Apply 1 application topically 4 (four) times daily. 02/10/19   [name], [name], PA-C    Allergies Metoprolol succinate, Niacin, and Sertraline hcl  Family History  Problem Relation Age of Onset  . Coronary artery disease Mother        s/p CABG  . Heart failure Mother   . Heart disease Other   . Depression Other     Social History Social History   Tobacco Use  . Smoking status: Former Smoker    Packs/day: 0.50    Years: 1.00    Pack years: 0.50    Types: Cigarettes    Quit date: 05/09/1968    Years since  quitting: 50.8  . Smokeless tobacco: Never Used  Substance Use Topics  . Alcohol use: No  . Drug use: No    Review of Systems  Constitutional: No fever/chills Eyes: No visual changes. ENT: No sore throat. Cardiovascular: Denies chest pain. Respiratory: Denies shortness of breath. Gastrointestinal: No abdominal pain.  No nausea, no vomiting.  No diarrhea.  No constipation. Genitourinary: Negative for dysuria. Musculoskeletal: Negative for back pain. Skin: Negative for rash. Neurological: Negative for headaches, focal weakness or numbness. Endocrine:  Hyperlipidemia and hypertension. Hematological/Lymphatic:  Allergic/Immunilogical: See allergy list. ____________________________________________   PHYSICAL EXAM:  VITAL SIGNS: ED Triage Vitals  Enc Vitals Group     BP 02/13/19 1451 (!) 156/84     Pulse Rate 02/13/19 1451 77     Resp 02/13/19  1451 16     Temp 02/13/19 1451 97.9 F (36.6 C)     Temp Source 02/13/19 1451 Oral     SpO2 02/13/19 1451 95 %     Weight 02/13/19 1452 150 lb 15.9 oz (68.5 kg)     Height 02/13/19 1452 5\' 9"  (1.753 m)     Head Circumference --      Peak Flow --      Pain Score 02/13/19 1451 10     Pain Loc --      Pain Edu? --      Excl. in [place]? --    Constitutional: Alert and oriented. Well appearing and in no acute distress. Neck: No stridor.   Hematological/Lymphatic/Immunilogical: No cervical lymphadenopathy. Cardiovascular: Normal rate, regular rhythm. Grossly normal heart sounds.  Good peripheral circulation. Respiratory: Normal respiratory effort.  No retractions. Lungs CTAB. Gastrointestinal: Soft and nontender. No distention. No abdominal bruits. No CVA tenderness. Musculoskeletal: No lower extremity tenderness nor edema.  No joint effusions. Neurologic:  Normal speech and language. No gross focal neurologic deficits are appreciated. No gait instability. Skin:  Skin is warm, dry and intact.  Resolving skin lesions. Psychiatric: Mood and  affect are normal. Speech and behavior are normal.  ____________________________________________   LABS (all labs ordered are listed, but only abnormal results are displayed)  Labs Reviewed - No data to display ____________________________________________  EKG   ____________________________________________  RADIOLOGY  ED MD interpretation:    Official radiology report(s): No results found.  ____________________________________________   PROCEDURES  Procedure(s) performed (including Critical Care):  Procedures   ____________________________________________   INITIAL IMPRESSION / ASSESSMENT AND PLAN / ED COURSE  As part of my medical decision making, I reviewed the following data within the electronic MEDICAL RECORD NUMBER         Patient presents with continue neuropathic pain status post herpes zoster episode 3 weeks ago.  Patient has been refractory to topical medications.  Patient was referred to neurology for definitive evaluation and pain relief.      ____________________________________________   FINAL CLINICAL IMPRESSION(S) / ED DIAGNOSES  Final diagnoses:  Post-herpetic polyneuropathy     ED Discharge Orders         Ordered    traMADol (ULTRAM) 50 MG tablet  Every 12 hours PRN     02/13/19 1517           Note:  This document was prepared using Dragon voice recognition software and may include unintentional dictation errors.    Joni Reining, PA-C 02/13/19 1526    Concha Se, MD 02/13/19 [phone]

## 2019-02-13 NOTE — ED Triage Notes (Signed)
Says he has shhingles (dried up now) but the pain never gets better.  Tried lidocaine patches and cream and gabapentin so far.  Says very tender to touch.

## 2019-02-18 ENCOUNTER — Other Ambulatory Visit: Payer: Self-pay

## 2019-02-18 ENCOUNTER — Emergency Department
Admission: EM | Admit: 2019-02-18 | Discharge: 2019-02-18 | Disposition: A | Discharge status: Home / Self Care | Payer: Medicare Other | Attending: Emergency Medicine | Admitting: Emergency Medicine

## 2019-02-18 ENCOUNTER — Encounter: Payer: Self-pay | Admitting: Emergency Medicine

## 2019-02-18 DIAGNOSIS — F1110 Opioid abuse, uncomplicated: Secondary | ICD-10-CM | POA: Insufficient documentation

## 2019-02-18 DIAGNOSIS — Z955 Presence of coronary angioplasty implant and graft: Secondary | ICD-10-CM | POA: Diagnosis not present

## 2019-02-18 DIAGNOSIS — J449 Chronic obstructive pulmonary disease, unspecified: Secondary | ICD-10-CM | POA: Diagnosis not present

## 2019-02-18 DIAGNOSIS — I509 Heart failure, unspecified: Secondary | ICD-10-CM | POA: Insufficient documentation

## 2019-02-18 DIAGNOSIS — T7840XA Allergy, unspecified, initial encounter: Secondary | ICD-10-CM | POA: Insufficient documentation

## 2019-02-18 DIAGNOSIS — Z87891 Personal history of nicotine dependence: Secondary | ICD-10-CM | POA: Diagnosis not present

## 2019-02-18 DIAGNOSIS — H538 Other visual disturbances: Secondary | ICD-10-CM | POA: Diagnosis not present

## 2019-02-18 DIAGNOSIS — I110 Hypertensive heart disease with heart failure: Secondary | ICD-10-CM | POA: Insufficient documentation

## 2019-02-18 DIAGNOSIS — Z79899 Other long term (current) drug therapy: Secondary | ICD-10-CM | POA: Insufficient documentation

## 2019-02-18 LAB — COMPREHENSIVE METABOLIC PANEL
ALT: 28 U/L (ref 0–44)
AST: 26 U/L (ref 15–41)
Albumin: 3.7 g/dL (ref 3.5–5.0)
Alkaline Phosphatase: 53 U/L (ref 38–126)
Anion gap: 10 (ref 5–15)
BUN: 17 mg/dL (ref 8–23)
CO2: 28 mmol/L (ref 22–32)
Calcium: 9 mg/dL (ref 8.9–10.3)
Chloride: 100 mmol/L (ref 98–111)
Creatinine, Ser: 1.02 mg/dL (ref 0.61–1.24)
GFR calc Af Amer: >60 mL/min (ref >60)
GFR calc non Af Amer: >60 mL/min (ref >60)
Glucose, Bld: 244 mg/dL — ABNORMAL HIGH (ref 70–99)
Potassium: 4.1 mmol/L (ref 3.5–5.1)
Sodium: 138 mmol/L (ref 135–145)
Total Bilirubin: 0.6 mg/dL (ref 0.3–1.2)
Total Protein: 6.9 g/dL (ref 6.5–8.1)

## 2019-02-18 LAB — URINE DRUG SCREEN, QUALITATIVE (ARMC ONLY)
Amphetamines, Ur Screen: NOT DETECTED
Barbiturates, Ur Screen: NOT DETECTED
Benzodiazepine, Ur Scrn: NOT DETECTED
Cannabinoid 50 Ng, Ur ~~LOC~~: NOT DETECTED
Cocaine Metabolite,Ur ~~LOC~~: NOT DETECTED
MDMA (Ecstasy)Ur Screen: NOT DETECTED
Methadone Scn, Ur: NOT DETECTED
Opiate, Ur Screen: POSITIVE — AB
Phencyclidine (PCP) Ur S: NOT DETECTED
Tricyclic, Ur Screen: NOT DETECTED

## 2019-02-18 LAB — CBC WITH DIFFERENTIAL/PLATELET
Abs Immature Granulocytes: 0.14 10*3/uL — ABNORMAL HIGH (ref 0.00–0.07)
Basophils Absolute: 0.1 10*3/uL (ref 0.0–0.1)
Basophils Relative: 1 %
Eosinophils Absolute: 0.2 10*3/uL (ref 0.0–0.5)
Eosinophils Relative: 2 %
HCT: 41.7 % (ref 39.0–52.0)
Hemoglobin: 13.9 g/dL (ref 13.0–17.0)
Immature Granulocytes: 1 %
Lymphocytes Relative: 20 %
Lymphs Abs: 2.5 10*3/uL (ref 0.7–4.0)
MCH: 28.5 pg (ref 26.0–34.0)
MCHC: 33.3 g/dL (ref 30.0–36.0)
MCV: 85.6 fL (ref 80.0–100.0)
Monocytes Absolute: 0.9 10*3/uL (ref 0.1–1.0)
Monocytes Relative: 7 %
Neutro Abs: 8.6 10*3/uL — ABNORMAL HIGH (ref 1.7–7.7)
Neutrophils Relative %: 69 %
Platelets: 251 10*3/uL (ref 150–400)
RBC: 4.87 MIL/uL (ref 4.22–5.81)
RDW: 13.8 % (ref 11.5–15.5)
WBC: 12.4 10*3/uL — ABNORMAL HIGH (ref 4.0–10.5)
nRBC: 0 % (ref 0.0–0.2)

## 2019-02-18 LAB — ETHANOL: Alcohol, Ethyl (B): <10 mg/dL (ref <10)

## 2019-02-18 LAB — ACETAMINOPHEN LEVEL: Acetaminophen (Tylenol), Serum: <10 ug/mL — ABNORMAL LOW (ref 10–30)

## 2019-02-18 LAB — GLUCOSE, CAPILLARY: Glucose-Capillary: 221 mg/dL — ABNORMAL HIGH (ref 70–99)

## 2019-02-18 LAB — TROPONIN I (HIGH SENSITIVITY): Troponin I (High Sensitivity): 9 ng/L (ref <18)

## 2019-02-18 LAB — SALICYLATE LEVEL: Salicylate Lvl: <7 mg/dL (ref 2.8–30.0)

## 2019-02-18 MED ORDER — ACETAMINOPHEN 500 MG PO TABS
1000.0000 mg | ORAL_TABLET | Freq: Once | ORAL | Status: AC
  Administered 2019-02-18: 1000 mg via ORAL
  Filled 2019-02-18: qty 2

## 2019-02-18 MED ORDER — KETOROLAC TROMETHAMINE 30 MG/ML IJ SOLN
15.0000 mg | Freq: Once | INTRAMUSCULAR | Status: AC
  Administered 2019-02-18: 14:00:00 15 mg via INTRAVENOUS
  Filled 2019-02-18: qty 1

## 2019-02-18 MED ORDER — FLUORESCEIN SODIUM 1 MG OP STRP
ORAL_STRIP | OPHTHALMIC | Status: AC
  Administered 2019-02-18: 13:00:00
  Filled 2019-02-18: qty 2

## 2019-02-18 NOTE — ED Triage Notes (Signed)
Pt in via POV, states, "I took two antihistamines this morning and I have been feeling weird since."  Reports "glassy eyes" and restlessness, states he had trouble driving here.  Vitals WDL, able to speak in clear, full sentences,  NAD noted at this time.

## 2019-02-18 NOTE — ED Provider Notes (Signed)
[facility] Emergency Department Provider Note  ____________________________________________   First MD Initiated Contact with Patient 02/18/19 1209     (approximate)  I have reviewed the triage vital signs and the nursing notes.   HISTORY  Chief Complaint Allergic Reaction    HPI Shane Reid. is a 73 y.o. male with COPD, CAD, herpes zoster who presents for concerns for reaction to his medication.  Patient has been taking oxycodone, tramadol and reportedly antihistamine that he takes at nighttime to help with his zoster.  He today took 1 pill this morning around 8 AM and he said about 30 to 45 minutes later he developed blurry vision in his bilateral eyes.  This is been constant, nothing makes better, nothing makes it worse.  He was able to drive here but says that he like his vision was blurry with driving.  He denies any chest pain.  He is continued to have left-sided pain from the herpes zoster.  After discussion with patient's wife it appears that the medicine patient took this morning was ibuprofen 200 mg.  He is adamant that he has not taking the famotidine today.  The only other medications he is been taking for this are tramadol and oxycodone.  He says that he is not been taking the gabapentin.    Past Medical History:  Diagnosis Date   Anxiety    Asthma    Bradycardia    CHF (congestive heart failure) (HCC)    Chronic Chest Pain    COPD (chronic obstructive pulmonary disease) (HCC)    Coronary artery disease    a. 2008 s/p PCI to LAD;  b. 2013 Cath: patent stent->Med Rx;  c. 11/2013 MV: EF 67%, no ischemia/infarct.   Depression    GERD (gastroesophageal reflux disease)    Heart murmur    a. 02/2010 Echo: EF 55-60%, no rwma, Gr 2 DD, triv MR, mildly dil LA, nl RV.   Hypercholesteremia    Hypertension    Hypertensive heart disease    Idiopathic pulmonary fibrosis (HCC)    a. Seen by Dr. Dema Severin 01/2015 and Rx Esbriet - pt  cannot afford.   Other social stressor    a. 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   IPF (idiopathic pulmonary fibrosis) (HCC) 12/23/2014   Pre-operative cardiovascular examination 11/05/2014   ILD (interstitial lung disease) (HCC) 09/04/2014   Interstitial lung disease (HCC) 06/22/2014   Upper respiratory infection 12/03/2013   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   CAROTID BRUIT, LEFT 05/12/2010   Hyperlipidemia 07/01/2009   CAD, NATIVE VESSEL 07/01/2009   SHORTNESS OF BREATH 07/01/2009   Atypical chest pain 07/01/2009   Family history of early CAD 01/23/2007   Anxiety and depression 02/02/2005    Past Surgical History:  Procedure Laterality Date   CARDIAC CATHETERIZATION     CORONARY ANGIOPLASTY     stents times 4   Esophagus stretch     EYE SURGERY     Bilateral cataracts   LUNG  BIOPSY     NASAL ENDOSCOPY N/A 12/21/2016   Procedure: NASAL ENDOSCOPY;  Surgeon: Vernie MurdersJuengel, Paul, MD;  [facility];  Service: ENT;  Laterality: N/A;   TESTICLE SURGERY     VIDEO ASSISTED THORACOSCOPY (VATS)/THOROCOTOMY Right 11/18/2014   Procedure: VIDEO ASSISTED THORACOSCOPY (VATS)/ wedge resection biopsy;  Surgeon: Hulda Marinimothy Oaks, MD;  Location: [facility] ORS;  Service: General;  Laterality: Right;    Prior to Admission medications   Medication Sig Start Date End Date Taking? Authorizing Provider  acyclovir (ZOVIRAX) 800 MG tablet Take 800 mg by mouth 5 (five) times daily.  For 7 days 01/23/19   [provider]  albuterol (PROVENTIL) (2.5 MG/3ML) 0.083% nebulizer solution Take 3 mLs (2.5 mg total) by nebulization every 6 (six) hours as needed. 05/02/12   Antonieta IbaGollan, [name], MD  albuterol (VENTOLIN HFA) 108 (90 Base) MCG/ACT inhaler Inhale 2 puffs into the lungs every 4 (four) hours as needed for wheezing. 01/22/06   [provider]  amLODipine-benazepril (LOTREL) 5-10 MG capsule TAKE 1 CAPSULE BY MOUTH EVERY DAY 05/20/18   Antonieta IbaGollan, [name], MD  aspirin EC 81 MG tablet Take 81 mg by mouth every morning. 08/06/12   [provider]  benzonatate (TESSALON PERLES) 100 MG capsule Take 1 capsule (100 mg total) by mouth 3 (three) times daily as needed for cough. 02/01/19 02/01/20  [name], [name], MD  benzonatate (TESSALON) 100 MG capsule Take 100-200 mg by mouth 3 (three) times daily as needed for cough. 01/22/19   [provider]  gabapentin (NEURONTIN) 300 MG capsule TAKE 1 TAB BY MOUTH 3 TIMES A DAY FOR NERVE PAIN 03/07/18   [provider]  lidocaine (XYLOCAINE) 5 % ointment Apply 1 application topically as needed. 02/10/19   [name], [name], PA-C  Multiple Vitamins-Minerals (CENTRUM SILVER PO) Take by mouth daily.    [provider]  nitroGLYCERIN (NITROSTAT) 0.4 MG SL tablet Place 1 tablet (0.4 mg total) under the tongue every 5 (five) minutes as needed. 07/30/17   Antonieta IbaGollan, [name], MD  nystatin (MYCOSTATIN/NYSTOP) powder Apply 1 application topically 4 (four) times daily. 01/10/19   [provider]  pantoprazole (PROTONIX) 40 MG tablet Take 40 mg by mouth daily. 12/19/18   [provider]  predniSONE (DELTASONE) 20 MG tablet Take 20-60 mg by mouth daily. 01/25/19   [provider]  rosuvastatin (CRESTOR) 10 MG tablet Take 1 tablet (10 mg total) by mouth daily. 09/05/18   Antonieta IbaGollan, [name], MD  traMADol (ULTRAM) 50 MG tablet Take 1 tablet (50 mg total) by mouth every 12 (twelve) hours as needed for  moderate pain. 02/13/19   [name], Ronald K, PA-C  triamcinolone cream (KENALOG) 0.1 % Apply 1 application topically 4 (four) times daily. 02/10/19   [name], [name], PA-C    Allergies Metoprolol succinate, Niacin, and Sertraline hcl  Family History  Problem Relation Age of Onset   Coronary artery disease Mother        s/p CABG   Heart failure Mother    Heart disease Other    Depression Other     Social History Social History   Tobacco Use   Smoking status: Former Smoker    Packs/day: 0.50    Years: 1.00    Pack years: 0.50    Types: Cigarettes    Quit date: 05/09/1968    Years since quitting: 50.8   Smokeless tobacco: Never Used  Substance Use Topics   Alcohol use: No   Drug  use: No      Review of Systems Constitutional: No fever/chills Eyes: Blurry vision ENT: No sore throat. Cardiovascular: Denies chest pain. Respiratory: Denies shortness of breath. Gastrointestinal: No abdominal pain.  No nausea, no vomiting.  No diarrhea.  No constipation. Genitourinary: Negative for dysuria. Musculoskeletal: Negative for back pain. Pain over the Left shoulder.  Skin: Negative for rash. Neurological: Negative for headaches, focal weakness or numbness. All other ROS negative ____________________________________________   PHYSICAL EXAM:  VITAL SIGNS: ED Triage Vitals  Enc Vitals Group     BP 02/18/19 1118 (!) 165/79     Pulse Rate 02/18/19 1118 74     Resp 02/18/19 1118 20     Temp 02/18/19 1118 (!) 96.6 F (35.9 C)     Temp Source 02/18/19 1118 Axillary     SpO2 02/18/19 1118 98 %     Weight 02/18/19 1127 152 lb (68.9 kg)     Height 02/18/19 1127 5\' 9"  (1.753 m)     Head Circumference --      Peak Flow --      Pain Score 02/18/19 1126 10     Pain Loc --      Pain Edu? --      Excl. in GC? --     Constitutional: Alert and oriented. Well appearing and in no acute distress. Eyes: Conjunctivae are normal. EOMI. pupils are reactive bilaterally.  No  vesicles in the ear Head: Atraumatic. Nose: No congestion/rhinnorhea. Mouth/Throat: Mucous membranes are moist.   Neck: No stridor. Trachea Midline. FROM.  Crusted over rash on the left side of his neck and onto his shoulder. Cardiovascular: Normal rate, regular rhythm. Grossly normal heart sounds.  Good peripheral circulation. Respiratory: Normal respiratory effort.  No retractions. Lungs CTAB. Gastrointestinal: Soft and nontender. No distention. No abdominal bruits.  Musculoskeletal: No lower extremity tenderness nor edema.  No joint effusions. Neurologic:  Normal speech and language. No gross focal neurologic deficits are appreciated.  Skin:  Skin is warm, dry and intact. No rash noted. Psychiatric: Mood and affect are normal. Speech and behavior are normal. GU: Deferred   ____________________________________________   LABS (all labs ordered are listed, but only abnormal results are displayed)  Labs Reviewed  GLUCOSE, CAPILLARY - Abnormal; Notable for the following components:      Result Value   Glucose-Capillary 221 (*)    All other components within normal limits  CBC WITH DIFFERENTIAL/PLATELET - Abnormal; Notable for the following components:   WBC 12.4 (*)    Neutro Abs 8.6 (*)    Abs Immature Granulocytes 0.14 (*)    All other components within normal limits  COMPREHENSIVE METABOLIC PANEL - Abnormal; Notable for the following components:   Glucose, Bld 244 (*)    All other components within normal limits  URINE DRUG SCREEN, QUALITATIVE ([facility] ONLY) - Abnormal; Notable for the following components:   Opiate, Ur Screen POSITIVE (*)    All other components within normal limits  ACETAMINOPHEN LEVEL - Abnormal; Notable for the following components:   Acetaminophen (Tylenol), Serum <10 (*)    All other components within normal limits  ETHANOL  SALICYLATE LEVEL  TROPONIN I (HIGH SENSITIVITY)  TROPONIN I (HIGH SENSITIVITY)    ____________________________________________   ED ECG REPORT I, Concha Se, the attending physician, personally viewed and interpreted this ECG.  EKG is normal sinus rate of 66, no ST elevation, no T wave inversion, normal intervals ____________________________________________   PROCEDURES  Procedure(s) performed (including Critical Care):  Procedures   ____________________________________________   INITIAL IMPRESSION / ASSESSMENT AND PLAN / ED COURSE  Lanice ShirtsRaymond S Lingard Jr. was evaluated in Emergency Department on 02/18/2019 for the symptoms described in the history of present illness. He was evaluated in the context of the global COVID-19 pandemic, which necessitated consideration that the patient might be at risk for infection with the SARS-CoV-2 virus that causes COVID-19. Institutional protocols and algorithms that pertain to the evaluation of patients at risk for COVID-19 are in a state of rapid change based on information released by regulatory bodies including the CDC and federal and state organizations. These policies and algorithms were followed during the patient's care in the ED.    Patient is a 73 year old gentleman whose getting treatment for herpes zoster who presents with concerns that his medication caused him blurry vision.  Patient is not a great historian but he says he took a tiny pink or orange-colored pill that his wife confirmed was ibuprofen and now he has bilateral blurry vision.  He is adamant that he is not taking the famotidine for a few days.  He denies any Benadryl.  Consider accidental overdose and will get Tylenol, aspirin, urine drug screen.  I do not see any evidence of vesicles inside the ear.  Will do a fluorescein stain to ensure there is no dendrites in his eyes.  Will get EKG to evaluate for QTC prolongation.   fluorescein staining did not show any evidence of dendritic patterns or abrasions.  After the fluorescein stain patient felt like his  vision was getting better.  Explained to patient that this is unlikely to affect his vision.  He then endorses that he has had these intermittent episodes of bilateral vision changes for some time now. He is not able to tell me how often they happen or how long it last.  He had bilateral lens replacement.  He is not follow-up with his ophthalmologist for 2 years although he supposed to see them once a year.  White count is 12.4 but patient was on recent steroids and no other infectious symptoms.  Salicylate and Tylenol level are negative.  1:36 PM patient now endorsing that his vision is back to baseline.  Patient says that he would like to leave immediately.  He says that his wife is disabled needs to get home to her.  He does not want to stay for any further work-up or testing.  He says that he feels at his baseline self at this time.  He does have some pain that is similar to the shingles pain that he had been feeling.  Given that he is going to be driving at home unable to give him any opioids.  Patient expressed understanding.  We will give him Toradol and Tylenol.  After that though patient says that he is adamant that he does not want to stay.  Patient will follow-up with his ophthalmologist for further eye testing.     ____________________________________________   FINAL CLINICAL IMPRESSION(S) / ED DIAGNOSES   Final diagnoses:  Blurry vision, bilateral      MEDICATIONS GIVEN DURING THIS VISIT:  Medications  fluorescein 1 MG ophthalmic strip (  Given 02/18/19 1245)  ketorolac (TORADOL) 30 MG/ML injection 15 mg (15 mg Intravenous Given 02/18/19 1338)  acetaminophen (TYLENOL) tablet 1,000 mg (1,000 mg Oral Given 02/18/19 1338)     ED Discharge Orders    None       Note:  This document was prepared using Dragon voice recognition  software and may include unintentional dictation errors.   Vanessa Curtiss, MD 02/18/19 [phone]

## 2019-02-18 NOTE — ED Triage Notes (Signed)
Patient walked very quickly into the ED.  Grabbed the back of a wheelchair like he was unstable.  I got him in a wheelchair.  Says he took 2 "histamines" and doesn't feel right.  Drove self here.  Using his phone while waiting for triage.  No distress.

## 2019-02-18 NOTE — ED Notes (Signed)
Pt aware urine sample needed 

## 2019-02-18 NOTE — ED Notes (Addendum)
See triage note  Has been treated for shingles over the past couple of weeks  Also states he took some meds this am  Feeling weird   States his vision is glassy   Has been taking the same meds for the past 2 weeks

## 2019-02-18 NOTE — Discharge Instructions (Signed)
You should follow-up with your ophthalmologist for further eye testing given your history of having your lenses in place.  You should make sure that you are careful about the medicines that you are taking.  The famotidine can cause blurry vision if taken too much however ibuprofen should not have any effect on your vision.  Therefore make sure you are not taking more than the prescribed amount of the famotidine.  You can return to the ER if the vision changes return, chest pain, shortness of breath or any other concerns.

## 2019-02-18 NOTE — ED Notes (Signed)
Pt brought from [facility] for possible allergic reaction to medicines taken for shingles. Pt A&Ox4 and speaking to Dr. Jari Pigg at this time

## 2019-02-18 NOTE — ED Notes (Signed)
Pt refuses discharge vitals and refuses to sign for discharge. Instructions given to patient but he kept saying over and over "I have to go".

## 2019-02-18 NOTE — ED Notes (Signed)
Pt standing up beside the bed c/o pain in right shoulder where shingles are, Dr. Jari Pigg informed and at bedside, meds ordered

## 2019-03-05 ENCOUNTER — Emergency Department
Admission: EM | Admit: 2019-03-05 | Discharge: 2019-03-05 | Disposition: A | Discharge status: Home / Self Care | Payer: Medicare Other | Source: Home / Self Care | Attending: Emergency Medicine | Admitting: Emergency Medicine

## 2019-03-05 ENCOUNTER — Other Ambulatory Visit: Payer: Self-pay

## 2019-03-05 ENCOUNTER — Emergency Department: Payer: Medicare Other

## 2019-03-05 ENCOUNTER — Encounter: Payer: Self-pay | Admitting: Emergency Medicine

## 2019-03-05 ENCOUNTER — Emergency Department
Admission: EM | Admit: 2019-03-05 | Discharge: 2019-03-05 | Disposition: A | Discharge status: Home / Self Care | Payer: Medicare Other | Attending: Emergency Medicine | Admitting: Emergency Medicine

## 2019-03-05 DIAGNOSIS — Z87891 Personal history of nicotine dependence: Secondary | ICD-10-CM | POA: Insufficient documentation

## 2019-03-05 DIAGNOSIS — Z5321 Procedure and treatment not carried out due to patient leaving prior to being seen by health care provider: Secondary | ICD-10-CM | POA: Insufficient documentation

## 2019-03-05 DIAGNOSIS — Z7982 Long term (current) use of aspirin: Secondary | ICD-10-CM | POA: Insufficient documentation

## 2019-03-05 DIAGNOSIS — Z20828 Contact with and (suspected) exposure to other viral communicable diseases: Secondary | ICD-10-CM | POA: Diagnosis not present

## 2019-03-05 DIAGNOSIS — I509 Heart failure, unspecified: Secondary | ICD-10-CM | POA: Insufficient documentation

## 2019-03-05 DIAGNOSIS — J441 Chronic obstructive pulmonary disease with (acute) exacerbation: Secondary | ICD-10-CM

## 2019-03-05 DIAGNOSIS — J189 Pneumonia, unspecified organism: Secondary | ICD-10-CM

## 2019-03-05 DIAGNOSIS — R0602 Shortness of breath: Secondary | ICD-10-CM | POA: Diagnosis present

## 2019-03-05 DIAGNOSIS — Z955 Presence of coronary angioplasty implant and graft: Secondary | ICD-10-CM | POA: Insufficient documentation

## 2019-03-05 DIAGNOSIS — I110 Hypertensive heart disease with heart failure: Secondary | ICD-10-CM | POA: Insufficient documentation

## 2019-03-05 DIAGNOSIS — Z79899 Other long term (current) drug therapy: Secondary | ICD-10-CM | POA: Insufficient documentation

## 2019-03-05 LAB — CBC
HCT: 40.9 % (ref 39.0–52.0)
Hemoglobin: 13.6 g/dL (ref 13.0–17.0)
MCH: 28.9 pg (ref 26.0–34.0)
MCHC: 33.3 g/dL (ref 30.0–36.0)
MCV: 86.8 fL (ref 80.0–100.0)
Platelets: 263 10*3/uL (ref 150–400)
RBC: 4.71 MIL/uL (ref 4.22–5.81)
RDW: 13.4 % (ref 11.5–15.5)
WBC: 10.1 10*3/uL (ref 4.0–10.5)
nRBC: 0 % (ref 0.0–0.2)

## 2019-03-05 LAB — BASIC METABOLIC PANEL
Anion gap: 9 (ref 5–15)
BUN: 17 mg/dL (ref 8–23)
CO2: 25 mmol/L (ref 22–32)
Calcium: 9.4 mg/dL (ref 8.9–10.3)
Chloride: 104 mmol/L (ref 98–111)
Creatinine, Ser: 1.13 mg/dL (ref 0.61–1.24)
GFR calc Af Amer: >60 mL/min (ref >60)
GFR calc non Af Amer: >60 mL/min (ref >60)
Glucose, Bld: 172 mg/dL — ABNORMAL HIGH (ref 70–99)
Potassium: 4.4 mmol/L (ref 3.5–5.1)
Sodium: 138 mmol/L (ref 135–145)

## 2019-03-05 LAB — TROPONIN I (HIGH SENSITIVITY)
Troponin I (High Sensitivity): 7 ng/L (ref <18)
Troponin I (High Sensitivity): 5 ng/L (ref <18)

## 2019-03-05 LAB — SARS CORONAVIRUS 2 (TAT 6-24 HRS): SARS Coronavirus 2: NEGATIVE

## 2019-03-05 MED ORDER — PREDNISONE 20 MG PO TABS: ORAL_TABLET | ORAL | 0 refills | Status: DC

## 2019-03-05 MED ORDER — IPRATROPIUM-ALBUTEROL 0.5-2.5 (3) MG/3ML IN SOLN
3.0000 mL | Freq: Once | RESPIRATORY_TRACT | Status: AC
  Administered 2019-03-05: 3 mL via RESPIRATORY_TRACT
  Filled 2019-03-05: qty 3

## 2019-03-05 MED ORDER — SODIUM CHLORIDE 0.9 % IV SOLN
1.0000 g | Freq: Once | INTRAVENOUS | Status: AC
  Administered 2019-03-05: 1 g via INTRAVENOUS
  Filled 2019-03-05: qty 10

## 2019-03-05 MED ORDER — METHYLPREDNISOLONE SODIUM SUCC 125 MG IJ SOLR
125.0000 mg | Freq: Once | INTRAMUSCULAR | Status: AC
  Administered 2019-03-05: 125 mg via INTRAVENOUS
  Filled 2019-03-05: qty 2

## 2019-03-05 MED ORDER — AZITHROMYCIN 250 MG PO TABS: 250.0000 mg | ORAL_TABLET | Freq: Every day | ORAL | 0 refills | Status: DC

## 2019-03-05 MED ORDER — HYDROCOD POLST-CPM POLST ER 10-8 MG/5ML PO SUER: 5.0000 mL | Freq: Two times a day (BID) | ORAL | 0 refills | Status: DC

## 2019-03-05 MED ORDER — SODIUM CHLORIDE 0.9 % IV SOLN
500.0000 mg | Freq: Once | INTRAVENOUS | Status: AC
  Administered 2019-03-05: 06:00:00 500 mg via INTRAVENOUS
  Filled 2019-03-05: qty 500

## 2019-03-05 MED ORDER — AMOXICILLIN-POT CLAVULANATE 875-125 MG PO TABS: 1.0000 | ORAL_TABLET | Freq: Two times a day (BID) | ORAL | 0 refills | Status: DC

## 2019-03-05 NOTE — ED Notes (Signed)
Pt pacing in lobby with no distress noted, to STAT desk to inform that he is leaving and going home

## 2019-03-05 NOTE — Discharge Instructions (Signed)
1.  Take antibiotics as prescribed: Augmentin 875 mg twice daily x7 days Azithromycin to 50 mg daily x4 days 2.  Finish steroid as prescribed: Prednisone 60 mg daily x4 days 3.  You may take Tussionex as needed for cough. 4.  Return to the ER for worsening symptoms, persistent vomiting, difficulty breathing or other concerns.

## 2019-03-05 NOTE — ED Notes (Signed)
Pt has not returned to ED lobby 

## 2019-03-05 NOTE — ED Provider Notes (Signed)
[facility] Emergency Department Provider Note   ____________________________________________   First MD Initiated Contact with Patient 03/05/19 [phone]     (approximate)  I have reviewed the triage vital signs and the nursing notes.   HISTORY  Chief Complaint Shortness of Breath    HPI Shane ShirtsRaymond S Rohde Jr. is a 73 y.o. male who presents to the ED from home with a chief complaint of shortness of breath.  Patient checked in earlier tonight but left.  Returns with the same complaint.  History of COPD, CHF, pulmonary fibrosis not on oxygen.  Denies fever, cough, chest pain, abdominal pain, nausea, vomiting or diarrhea.  Denies recent travel, trauma or hormone use.       Past Medical History:  Diagnosis Date   Anxiety    Asthma    Bradycardia    CHF (congestive heart failure) (HCC)    Chronic Chest Pain    COPD (chronic obstructive pulmonary disease) (HCC)    Coronary artery disease    a. 2008 s/p PCI to LAD;  b. 2013 Cath: patent stent->Med Rx;  c. 11/2013 MV: EF 67%, no ischemia/infarct.   Depression    GERD (gastroesophageal reflux disease)    Heart murmur    a. 02/2010 Echo: EF 55-60%, no rwma, Gr 2 DD, triv MR, mildly dil LA, nl RV.   Hypercholesteremia    Hypertension    Hypertensive heart disease    Idiopathic pulmonary fibrosis (HCC)    a. Seen by Dr. Dema SeverinMungal 01/2015 and Rx Esbriet - pt cannot afford.   Other social stressor    a. 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   IPF (idiopathic pulmonary fibrosis) (HCC) 12/23/2014   Pre-operative cardiovascular examination 11/05/2014   ILD (interstitial lung disease) (HCC) 09/04/2014   Interstitial lung disease (HCC) 06/22/2014   Upper respiratory infection 12/03/2013   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   CAROTID BRUIT, LEFT 05/12/2010   Hyperlipidemia 07/01/2009   CAD, NATIVE VESSEL 07/01/2009   SHORTNESS OF BREATH 07/01/2009   Atypical chest pain 07/01/2009   Family history of early CAD 01/23/2007   Anxiety and depression 02/02/2005    Past Surgical History:  Procedure Laterality Date   CARDIAC CATHETERIZATION     CORONARY ANGIOPLASTY     stents times 4   Esophagus stretch     EYE SURGERY     Bilateral cataracts   LUNG BIOPSY     NASAL ENDOSCOPY N/A 12/21/2016   Procedure: NASAL ENDOSCOPY;  Surgeon: Vernie MurdersJuengel, Paul, MD;  [facility];  Service: ENT;  Laterality: N/A;   TESTICLE SURGERY     VIDEO ASSISTED THORACOSCOPY (VATS)/THOROCOTOMY Right 11/18/2014   Procedure: VIDEO ASSISTED THORACOSCOPY (VATS)/ wedge resection biopsy;  Surgeon: Hulda Marinimothy Oaks, MD;  Location: [facility]S;  Service: General;  Laterality: Right;    Prior to Admission medications   Medication Sig Start Date End Date Taking? Authorizing Provider  acyclovir (ZOVIRAX) 800 MG tablet Take 800 mg by mouth 5 (five) times daily. For  7 days 01/23/19   [provider]  albuterol (PROVENTIL) (2.5 MG/3ML) 0.083% nebulizer solution Take 3 mLs (2.5 mg total) by nebulization every 6 (six) hours as needed. 05/02/12   Antonieta Iba, MD  albuterol (VENTOLIN HFA) 108 (90 Base) MCG/ACT inhaler Inhale 2 puffs into the lungs every 4 (four) hours as needed for wheezing. 01/22/06   [provider]  amLODipine-benazepril (LOTREL) 5-10 MG capsule TAKE 1 CAPSULE BY MOUTH EVERY DAY 05/20/18   Antonieta Iba, MD  amoxicillin-clavulanate (AUGMENTIN) 875-125 MG tablet  Take 1 tablet by mouth 2 (two) times daily. 03/05/19   Irean Hong, MD  aspirin EC 81 MG tablet Take 81 mg by mouth every morning. 08/06/12   [provider]  azithromycin (ZITHROMAX) 250 MG tablet Take 1 tablet (250 mg total) by mouth daily. 03/05/19   Irean Hong, MD  benzonatate (TESSALON PERLES) 100 MG capsule Take 1 capsule (100 mg total) by mouth 3 (three) times daily as needed for cough. 02/01/19 02/01/20  Arnaldo Natal, MD  benzonatate (TESSALON) 100 MG capsule Take 100-200 mg by mouth 3 (three) times daily as needed for cough. 01/22/19   [provider]  chlorpheniramine-HYDROcodone (TUSSIONEX PENNKINETIC ER) 10-8 MG/5ML SUER Take 5 mLs by mouth 2 (two) times daily. 03/05/19   Irean Hong, MD  gabapentin (NEURONTIN) 300 MG capsule TAKE 1 TAB BY MOUTH 3 TIMES A DAY FOR NERVE PAIN 03/07/18   [provider]  lidocaine (XYLOCAINE) 5 % ointment Apply 1 application topically as needed. 02/10/19   [name], Delorise Royals, PA-C  Multiple Vitamins-Minerals (CENTRUM SILVER PO) Take by mouth daily.    [provider]  nitroGLYCERIN (NITROSTAT) 0.4 MG SL tablet Place 1 tablet (0.4 mg total) under the tongue every 5 (five) minutes as needed. 07/30/17   Antonieta Iba, MD  nystatin (MYCOSTATIN/NYSTOP) powder Apply 1 application topically 4 (four) times daily. 01/10/19   [provider]  pantoprazole (PROTONIX) 40 MG tablet Take 40 mg by mouth daily. 12/19/18   [provider]  predniSONE (DELTASONE) 20 MG tablet 3 tablets PO qd x 4 days 03/05/19   Irean Hong, MD  rosuvastatin (CRESTOR) 10 MG tablet Take 1 tablet (10 mg total) by mouth daily. 09/05/18   Antonieta Iba, MD  traMADol (ULTRAM) 50 MG tablet Take 1 tablet (50 mg total) by mouth every 12 (twelve) hours as needed for moderate pain. 02/13/19   Joni Reining, PA-C  triamcinolone cream (KENALOG) 0.1 % Apply 1 application topically 4 (four) times daily. 02/10/19   [name], Delorise Royals, PA-C     Allergies Metoprolol succinate, Niacin, and Sertraline hcl  Family History  Problem Relation Age of Onset   Coronary artery disease Mother        s/p CABG   Heart failure Mother    Heart disease Other    Depression Other     Social History Social History   Tobacco Use   Smoking status: Former Smoker    Packs/day: 0.50    Years: 1.00    Pack years: 0.50    Types: Cigarettes    Quit date: 05/09/1968    Years since quitting: 50.8   Smokeless tobacco: Never Used  Substance Use Topics   Alcohol use: No   Drug use: No    Review of Systems  Constitutional: No fever/chills Eyes: No visual changes. ENT: No sore throat. Cardiovascular: Denies chest pain. Respiratory: Positive for shortness of breath. Gastrointestinal: No abdominal  pain.  No nausea, no vomiting.  No diarrhea.  No constipation. Genitourinary: Negative for dysuria. Musculoskeletal: Negative for back pain. Skin: Negative for rash. Neurological: Negative for headaches, focal weakness or numbness.   ____________________________________________   PHYSICAL EXAM:  VITAL SIGNS: ED Triage Vitals  Enc Vitals Group     BP 03/05/19 0425 (!) 151/90     Pulse Rate 03/05/19 0425 65     Resp 03/05/19 0425 20     Temp 03/05/19 0425 (!) 97.5 F (36.4 C)     Temp Source 03/05/19 0425 Oral     SpO2 03/05/19 0425 98 %     Weight 03/05/19 0426 151 lb 14.4 oz (68.9 kg)     Height 03/05/19 0426 5\' 9"  (1.753 m)     Head Circumference --      Peak Flow --      Pain Score 03/05/19 0425 7     Pain Loc --      Pain Edu? --      Excl. in GC? --     Constitutional: Alert and oriented. Well appearing and in no acute distress. Eyes: Conjunctivae are normal. PERRL. EOMI. Head: Atraumatic. Nose: No congestion/rhinnorhea. Mouth/Throat: Mucous membranes are moist.  Oropharynx non-erythematous. Neck: No stridor.   Cardiovascular: Normal rate, regular rhythm. Grossly normal heart sounds.  Good peripheral  circulation. Respiratory: Normal respiratory effort.  No retractions. Lungs with slight wheeze. Gastrointestinal: Soft and nontender. No distention. No abdominal bruits. No CVA tenderness. Musculoskeletal: No lower extremity tenderness nor edema.  No joint effusions. Neurologic:  Normal speech and language. No gross focal neurologic deficits are appreciated. No gait instability. Skin:  Skin is warm, dry and intact. No rash noted. Psychiatric: Mood and affect are normal. Speech and behavior are normal.  ____________________________________________   LABS (all labs ordered are listed, but only abnormal results are displayed)  Labs Reviewed  CULTURE, BLOOD (ROUTINE X 2)  CULTURE, BLOOD (ROUTINE X 2)  SARS CORONAVIRUS 2 (TAT 6-24 HRS)  TROPONIN I (HIGH SENSITIVITY)   ____________________________________________  EKG  ED ECG REPORT I, Hesper Venturella J, the attending physician, personally viewed and interpreted this ECG.   Date: 03/05/2019  EKG Time: 0431  Rate: 70  Rhythm: normal EKG, normal sinus rhythm  Axis: Normal  Intervals:none  ST&T Change: Nonspecific  ____________________________________________  RADIOLOGY  ED MD interpretation: Probable left lower lobe pneumonia  Official radiology report(s): Dg Chest 2 View  Result Date: 03/05/2019 CLINICAL DATA:  Shortness of breath EXAM: CHEST - 2 VIEW COMPARISON:  02/01/2019, 06/12/2017, 05/01/2018, 11/15/2016 FINDINGS: Decreased lung volumes. Chronic interstitial lung disease. Slight increased opacity at the medial left lung base. No pleural effusion. Stable cardiomediastinal silhouette. No pneumothorax. IMPRESSION: 1. Underlying interstitial lung disease 2. Slight increased patchy opacity at the medial left base may reflect superimposed atelectasis or acute infectious/inflammatory process. Electronically Signed   By: Jasmine Pang M.D.   On: 03/05/2019 03:04     ____________________________________________   PROCEDURES  Procedure(s) performed (including Critical Care):  Procedures   ____________________________________________   INITIAL IMPRESSION / ASSESSMENT AND PLAN / ED COURSE  As part of my medical decision making, I reviewed the following data within the electronic MEDICAL RECORD NUMBER Nursing notes reviewed and incorporated, Labs reviewed, EKG interpreted, Old chart reviewed, Radiograph reviewed and Notes from prior ED visits     Shane Reid. was evaluated in Emergency Department on 03/05/2019 for the symptoms described in the history of present illness. He was evaluated in the context  of the global COVID-19 pandemic, which necessitated consideration that the patient might be at risk for infection with the SARS-CoV-2 virus that causes COVID-19. Institutional protocols and algorithms that pertain to the evaluation of patients at risk for COVID-19 are in a state of rapid change based on information released by regulatory bodies including the CDC and federal and state organizations. These policies and algorithms were followed during the patient's care in the ED.    73 year old male with pulmonary fibrosis, COPD, CHF not on oxygen presenting with shortness of breath. Differential includes, but is not limited to, viral syndrome, bronchitis including COPD exacerbation, pneumonia, reactive airway disease including asthma, CHF including exacerbation with or without pulmonary/interstitial edema, pneumothorax, ACS, thoracic trauma, and pulmonary embolism.  I personally reviewed patient's lab work from just 3 hours ago.  Will add blood cultures, repeat troponin.  Initiate IV antibiotics for community-acquired pneumonia.  Patient states he had 2 COVID-19 test in September and tested negative for both; last one was 2 weeks ago.  Will obtain send out COVID-19 swab.  Administer IV Solu-Medrol and DuoNeb and reassess.   Clinical Course as of Mar 04 630  Wed Mar 05, 2019  0630 Troponin trending down from prior.  Patient will be discharged after completion of IV antibiotics.  He will be discharged on Augmentin, azithromycin, prednisone burst and Tussionex.  He will follow-up closely with his PCP.  Strict return precautions given.  Patient verbalizes understanding and agrees with plan of care.   [JS]    Clinical Course User Index [JS] Paulette Blanch, MD     ____________________________________________   FINAL CLINICAL IMPRESSION(S) / ED DIAGNOSES  Final diagnoses:  COPD exacerbation ([place])  Community acquired pneumonia of left lung, unspecified part of lung     ED Discharge Orders         Ordered    amoxicillin-clavulanate (AUGMENTIN) 875-125 MG tablet  2 times daily     03/05/19 0555    azithromycin (ZITHROMAX) 250 MG tablet  Daily     03/05/19 0555    chlorpheniramine-HYDROcodone (TUSSIONEX PENNKINETIC ER) 10-8 MG/5ML SUER  2 times daily     03/05/19 0555    predniSONE (DELTASONE) 20 MG tablet     03/05/19 0557           Note:  This document was prepared using Dragon voice recognition software and may include unintentional dictation errors.   Paulette Blanch, MD 03/05/19 [phone]

## 2019-03-05 NOTE — ED Notes (Signed)
Patient ambulatory to lobby with steady gait and NAD noted. Verbalized understanding of discharge instructions and follow-up care.  

## 2019-03-05 NOTE — ED Notes (Signed)
Pt noted leaving ED lobby 

## 2019-03-05 NOTE — ED Triage Notes (Signed)
Patient to ER for c/o shortness of breath. Was here earlier this morning, but decided not to stay and went home. Patient now back for the same complaint. Able to speak in complete sentences without difficulty.

## 2019-03-05 NOTE — ED Triage Notes (Signed)
Pt to the er for SOB. Pt sats are 98%. Pt says he was in [place] in Aug for shingles and pt had pain tonight took 6 advil in the last 8 hours. Pt speech is pressured and rapid. Pt says his breathing has been bad since he took the pulls 8 hours ago. Pt is anxious and needs to be reassured. Pt says he has oxygen at home but he is not on it all the time. Pt states he does not need it.

## 2019-03-05 NOTE — ED Notes (Signed)
Pt returns to ED lobby, ambulatory with no distress; xray notified

## 2019-03-10 LAB — CULTURE, BLOOD (ROUTINE X 2)
Culture: NO GROWTH
Culture: NO GROWTH

## 2019-03-11 DIAGNOSIS — B0229 Other postherpetic nervous system involvement: Secondary | ICD-10-CM | POA: Insufficient documentation

## 2019-04-09 ENCOUNTER — Other Ambulatory Visit: Payer: Self-pay

## 2019-04-09 ENCOUNTER — Emergency Department
Admission: EM | Admit: 2019-04-09 | Discharge: 2019-04-09 | Disposition: A | Discharge status: Home / Self Care | Payer: Medicare Other | Attending: Emergency Medicine | Admitting: Emergency Medicine

## 2019-04-09 ENCOUNTER — Emergency Department: Payer: Medicare Other

## 2019-04-09 ENCOUNTER — Encounter: Payer: Self-pay | Admitting: Emergency Medicine

## 2019-04-09 DIAGNOSIS — I2510 Atherosclerotic heart disease of native coronary artery without angina pectoris: Secondary | ICD-10-CM | POA: Insufficient documentation

## 2019-04-09 DIAGNOSIS — I509 Heart failure, unspecified: Secondary | ICD-10-CM | POA: Diagnosis not present

## 2019-04-09 DIAGNOSIS — I110 Hypertensive heart disease with heart failure: Secondary | ICD-10-CM | POA: Insufficient documentation

## 2019-04-09 DIAGNOSIS — Z87891 Personal history of nicotine dependence: Secondary | ICD-10-CM | POA: Insufficient documentation

## 2019-04-09 DIAGNOSIS — J449 Chronic obstructive pulmonary disease, unspecified: Secondary | ICD-10-CM | POA: Diagnosis not present

## 2019-04-09 DIAGNOSIS — E1100 Type 2 diabetes mellitus with hyperosmolarity without nonketotic hyperglycemic-hyperosmolar coma (NKHHC): Secondary | ICD-10-CM | POA: Diagnosis not present

## 2019-04-09 DIAGNOSIS — J45909 Unspecified asthma, uncomplicated: Secondary | ICD-10-CM | POA: Diagnosis not present

## 2019-04-09 DIAGNOSIS — E1165 Type 2 diabetes mellitus with hyperglycemia: Secondary | ICD-10-CM | POA: Diagnosis not present

## 2019-04-09 DIAGNOSIS — H538 Other visual disturbances: Secondary | ICD-10-CM | POA: Diagnosis present

## 2019-04-09 LAB — CBC WITH DIFFERENTIAL/PLATELET
Abs Immature Granulocytes: 0.14 10*3/uL — ABNORMAL HIGH (ref 0.00–0.07)
Basophils Absolute: 0 10*3/uL (ref 0.0–0.1)
Basophils Relative: 0 %
Eosinophils Absolute: 0 10*3/uL (ref 0.0–0.5)
Eosinophils Relative: 0 %
HCT: 42 % (ref 39.0–52.0)
Hemoglobin: 14.1 g/dL (ref 13.0–17.0)
Immature Granulocytes: 1 %
Lymphocytes Relative: 4 %
Lymphs Abs: 0.8 10*3/uL (ref 0.7–4.0)
MCH: 28.5 pg (ref 26.0–34.0)
MCHC: 33.6 g/dL (ref 30.0–36.0)
MCV: 85 fL (ref 80.0–100.0)
Monocytes Absolute: 0.2 10*3/uL (ref 0.1–1.0)
Monocytes Relative: 1 %
Neutro Abs: 17.4 10*3/uL — ABNORMAL HIGH (ref 1.7–7.7)
Neutrophils Relative %: 94 %
Platelets: 281 10*3/uL (ref 150–400)
RBC: 4.94 MIL/uL (ref 4.22–5.81)
RDW: 12.3 % (ref 11.5–15.5)
WBC: 18.7 10*3/uL — ABNORMAL HIGH (ref 4.0–10.5)
nRBC: 0 % (ref 0.0–0.2)

## 2019-04-09 LAB — URINALYSIS, COMPLETE (UACMP) WITH MICROSCOPIC
Bacteria, UA: NONE SEEN
Bilirubin Urine: NEGATIVE
Glucose, UA: >=500 mg/dL — AB
Ketones, ur: 5 mg/dL — AB
Leukocytes,Ua: NEGATIVE
Nitrite: NEGATIVE
Protein, ur: NEGATIVE mg/dL
Specific Gravity, Urine: 1.025 (ref 1.005–1.030)
Squamous Epithelial / HPF: NONE SEEN (ref 0–5)
WBC, UA: NONE SEEN WBC/hpf (ref 0–5)
pH: 5 (ref 5.0–8.0)

## 2019-04-09 LAB — COMPREHENSIVE METABOLIC PANEL
ALT: 27 U/L (ref 0–44)
AST: 26 U/L (ref 15–41)
Albumin: 3.9 g/dL (ref 3.5–5.0)
Alkaline Phosphatase: 79 U/L (ref 38–126)
Anion gap: 15 (ref 5–15)
BUN: 31 mg/dL — ABNORMAL HIGH (ref 8–23)
CO2: 20 mmol/L — ABNORMAL LOW (ref 22–32)
Calcium: 9.9 mg/dL (ref 8.9–10.3)
Chloride: 90 mmol/L — ABNORMAL LOW (ref 98–111)
Creatinine, Ser: 1.21 mg/dL (ref 0.61–1.24)
GFR calc Af Amer: >60 mL/min (ref >60)
GFR calc non Af Amer: 59 mL/min — ABNORMAL LOW (ref >60)
Glucose, Bld: 900 mg/dL (ref 70–99)
Potassium: 5.2 mmol/L — ABNORMAL HIGH (ref 3.5–5.1)
Sodium: 125 mmol/L — ABNORMAL LOW (ref 135–145)
Total Bilirubin: 0.8 mg/dL (ref 0.3–1.2)
Total Protein: 7.9 g/dL (ref 6.5–8.1)

## 2019-04-09 LAB — BLOOD GAS, VENOUS
Acid-base deficit: 3.3 mmol/L — ABNORMAL HIGH (ref 0.0–2.0)
Bicarbonate: 21.3 mmol/L (ref 20.0–28.0)
O2 Saturation: 81.6 %
Patient temperature: 37
pCO2, Ven: 36 mmHg — ABNORMAL LOW (ref 44.0–60.0)
pH, Ven: 7.38 (ref 7.250–7.430)
pO2, Ven: 47 mmHg — ABNORMAL HIGH (ref 32.0–45.0)

## 2019-04-09 LAB — GLUCOSE, CAPILLARY
Glucose-Capillary: >600 mg/dL (ref 70–99)
Glucose-Capillary: 501 mg/dL (ref 70–99)
Glucose-Capillary: 469 mg/dL — ABNORMAL HIGH (ref 70–99)

## 2019-04-09 LAB — BETA-HYDROXYBUTYRIC ACID: Beta-Hydroxybutyric Acid: 0.73 mmol/L — ABNORMAL HIGH (ref 0.05–0.27)

## 2019-04-09 MED ORDER — INSULIN ASPART 100 UNIT/ML ~~LOC~~ SOLN
10.0000 [IU] | Freq: Once | SUBCUTANEOUS | Status: AC
  Administered 2019-04-09: 10 [IU] via SUBCUTANEOUS
  Filled 2019-04-09: qty 1

## 2019-04-09 MED ORDER — INSULIN REGULAR(HUMAN) IN NACL 100-0.9 UT/100ML-% IV SOLN: INTRAVENOUS | Status: DC

## 2019-04-09 MED ORDER — METFORMIN HCL 500 MG PO TABS
500.0000 mg | ORAL_TABLET | Freq: Once | ORAL | Status: AC
  Administered 2019-04-09: 11:00:00 500 mg via ORAL
  Filled 2019-04-09: qty 1

## 2019-04-09 MED ORDER — DIPHENHYDRAMINE HCL 50 MG/ML IJ SOLN
25.0000 mg | Freq: Once | INTRAMUSCULAR | Status: AC
  Administered 2019-04-09: 08:00:00 25 mg via INTRAVENOUS
  Filled 2019-04-09: qty 1

## 2019-04-09 MED ORDER — IPRATROPIUM-ALBUTEROL 0.5-2.5 (3) MG/3ML IN SOLN
3.0000 mL | Freq: Once | RESPIRATORY_TRACT | Status: AC
  Administered 2019-04-09: 3 mL via RESPIRATORY_TRACT
  Filled 2019-04-09: qty 3

## 2019-04-09 MED ORDER — SODIUM CHLORIDE 0.9 % IV SOLN
Freq: Once | INTRAVENOUS | Status: AC
  Administered 2019-04-09: 09:00:00 via INTRAVENOUS

## 2019-04-09 MED ORDER — METFORMIN HCL 500 MG PO TABS: 500.0000 mg | ORAL_TABLET | Freq: Two times a day (BID) | ORAL | 11 refills | Status: DC

## 2019-04-09 MED ORDER — INSULIN ASPART 100 UNIT/ML ~~LOC~~ SOLN
10.0000 [IU] | Freq: Once | SUBCUTANEOUS | Status: AC
  Administered 2019-04-09: 10 [IU] via INTRAVENOUS
  Filled 2019-04-09: qty 1

## 2019-04-09 NOTE — ED Provider Notes (Addendum)
[facility] Emergency Department Provider Note       Time seen: ----------------------------------------- 7:51 AM on 04/09/2019 -----------------------------------------   I have reviewed the triage vital signs and the nursing notes.  HISTORY   Chief Complaint Allergic Reaction    HPI Shane Hardenbrook. is a 73 y.o. male with a history of anxiety, asthma, CHF, COPD, coronary artery disease, GERD, hyperlipidemia, hypertension, pulmonary fibrosis who presents to the ED for allergic reaction to a prednisone shot he was given yesterday.  Patient states he was having blurry vision and felt unsteady on his feet.  He denies any other complaints currently.  Denies any difficulty breathing or swallowing, denies any rash  Past Medical History:  Diagnosis Date  . Anxiety   . Asthma   . Bradycardia   . CHF (congestive heart failure) ([place])   . Chronic Chest Pain   . COPD (chronic obstructive pulmonary disease) ([place])   . Coronary artery disease    a. 2008 s/p PCI to LAD;  b. 2013 Cath: patent stent->Med Rx;  c. 11/2013 MV: EF 67%, no ischemia/infarct.  . Depression   . GERD (gastroesophageal reflux disease)   . Heart murmur    a. 02/2010 Echo: EF 55-60%, no rwma, Gr 2 DD, triv MR, mildly dil LA, nl RV.  [name] Hypercholesteremia   . Hypertension   . Hypertensive heart disease   . Idiopathic pulmonary fibrosis ([place])    a. Seen by Dr. Stevenson Clinch 01/2015 and Rx Esbriet - pt cannot afford.  . Other social stressor    a. wife with mental illness    Patient Active Problem List   Diagnosis Date Noted  . Hypertension 04/09/2018  . Pain in limb 04/09/2018  . 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 Benign localized hyperplasia of prostate with urinary obstruction 03/01/2017  . Incomplete emptying of bladder 03/01/2017  . Scrotal pain 09/11/2016  . Oropharyngeal dysphagia 09/21/2015  . Hypertensive heart disease   . Chronic Chest Pain   . Ischemic heart disease   .  Hypercholesteremia   . 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 IPF (idiopathic pulmonary fibrosis) ([place]) 12/23/2014  . Pre-operative cardiovascular examination 11/05/2014  . ILD (interstitial lung disease) ([place]) 09/04/2014  . Interstitial lung disease ([place]) 06/22/2014  . Upper respiratory infection 12/03/2013  . 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 CAROTID BRUIT, LEFT 05/12/2010  . Hyperlipidemia 07/01/2009  . CAD, NATIVE VESSEL 07/01/2009  . SHORTNESS OF BREATH 07/01/2009  . Atypical chest pain 07/01/2009  . Family history of early CAD 01/23/2007  . Anxiety and depression 02/02/2005    Past Surgical History:  Procedure Laterality Date  . CARDIAC CATHETERIZATION    . CORONARY ANGIOPLASTY     stents times 4  . Esophagus stretch    . EYE SURGERY     Bilateral cataracts  . LUNG BIOPSY    . NASAL ENDOSCOPY N/A 12/21/2016   Procedure: NASAL ENDOSCOPY;  Surgeon: Margaretha Sheffield, MD;  Location: [place];  Service: ENT;  Laterality: N/A;  . TESTICLE SURGERY    . VIDEO ASSISTED THORACOSCOPY (VATS)/THOROCOTOMY Right 11/18/2014   Procedure: VIDEO ASSISTED THORACOSCOPY (VATS)/ wedge resection biopsy;  Surgeon: Nestor Lewandowsky, MD;  Location: [facility];  Service: General;  Laterality: Right;    Allergies Metoprolol succinate, Niacin, and Sertraline hcl  Social History Social History   Tobacco Use  . Smoking status: Former Smoker    Packs/day: 0.50    Years: 1.00    Pack years:  0.50    Types: Cigarettes    Quit date: 05/09/1968    Years since quitting: 50.9  . Smokeless tobacco: Never Used  Substance Use Topics  . Alcohol use: No  . Drug use: No    Review of Systems Constitutional: Negative for fever. Cardiovascular: Negative for chest pain. Respiratory: Negative for shortness of breath. Gastrointestinal: Negative for abdominal pain, vomiting and diarrhea. Musculoskeletal: Negative for back pain. Skin: Negative for  rash. Neurological: Positive for dizziness  All systems negative/normal/unremarkable except as stated in the HPI  ____________________________________________   PHYSICAL EXAM:  VITAL SIGNS: ED Triage Vitals  Enc Vitals Group     BP 04/09/19 0730 (!) 162/81     Pulse Rate 04/09/19 0730 88     Resp 04/09/19 0730 19     Temp 04/09/19 0730 97.8 F (36.6 C)     Temp Source 04/09/19 0730 Oral     SpO2 04/09/19 0730 95 %     Weight 04/09/19 0725 151 lb 14.4 oz (68.9 kg)     Height 04/09/19 0731 5\' 9"  (1.753 m)     Head Circumference --      Peak Flow --      Pain Score 04/09/19 0725 0     Pain Loc --      Pain Edu? --      Excl. in GC? --    Constitutional: Alert and oriented. Well appearing and in no distress. Eyes: Conjunctivae are normal. Normal extraocular movements. ENT      Head: Normocephalic and atraumatic.      Nose: No congestion/rhinnorhea.      Mouth/Throat: Mucous membranes are moist.      Neck: No stridor. Cardiovascular: Normal rate, regular rhythm. No murmurs, rubs, or gallops. Respiratory: Normal respiratory effort without tachypnea nor retractions. Breath sounds are clear and equal bilaterally. No wheezes/rales/rhonchi. Gastrointestinal: Soft and nontender. Normal bowel sounds Musculoskeletal: Nontender with normal range of motion in extremities. No lower extremity tenderness nor edema. Neurologic:  Normal speech and language. No gross focal neurologic deficits are appreciated.  Skin:  Skin is warm, dry and intact. No rash noted. Psychiatric: Mood and affect are normal. Speech and behavior are normal.  ____________________________________________  ED COURSE:  As part of my medical decision making, I reviewed the following data within the electronic MEDICAL RECORD NUMBER History obtained from family if available, nursing notes, old chart and ekg, as well as notes from prior ED visits. Patient presented for possible allergic reaction, we will assess with labs as  indicated at this time. Clinical Course as of Apr 08 1022  Wed Apr 09, 2019  Apr 11, 2019 Patient has refused hospital admission   [JW]    Clinical Course User Index [JW] 9798, MD   Procedures  Shane Filbert. was evaluated in Emergency Department on 04/09/2019 for the symptoms described in the history of present illness. He was evaluated in the context of the global COVID-19 pandemic, which necessitated consideration that the patient might be at risk for infection with the SARS-CoV-2 virus that causes COVID-19. Institutional protocols and algorithms that pertain to the evaluation of patients at risk for COVID-19 are in a state of rapid change based on information released by regulatory bodies including the CDC and federal and state organizations. These policies and algorithms were followed during the patient's care in the ED.  ____________________________________________   LABS (pertinent positives/negatives)  Labs Reviewed  CBC WITH DIFFERENTIAL/PLATELET - Abnormal; Notable for the following components:  Result Value   WBC 18.7 (*)    Neutro Abs 17.4 (*)    Abs Immature Granulocytes 0.14 (*)    All other components within normal limits  COMPREHENSIVE METABOLIC PANEL - Abnormal; Notable for the following components:   Sodium 125 (*)    Potassium 5.2 (*)    Chloride 90 (*)    CO2 20 (*)    Glucose, Bld 900 (*)    BUN 31 (*)    GFR calc non Af Amer 59 (*)    All other components within normal limits  BLOOD GAS, VENOUS - Abnormal; Notable for the following components:   pCO2, Ven 36 (*)    pO2, Ven 47.0 (*)    Acid-base deficit 3.3 (*)    All other components within normal limits  BETA-HYDROXYBUTYRIC ACID - Abnormal; Notable for the following components:   Beta-Hydroxybutyric Acid 0.73 (*)    All other components within normal limits  URINALYSIS, COMPLETE (UACMP) WITH MICROSCOPIC - Abnormal; Notable for the following components:   Color, Urine STRAW (*)     APPearance CLEAR (*)    Glucose, UA >=500 (*)    Hgb urine dipstick SMALL (*)    Ketones, ur 5 (*)    All other components within normal limits  GLUCOSE, CAPILLARY - Abnormal; Notable for the following components:   Glucose-Capillary >600 (*)    All other components within normal limits  GLUCOSE, CAPILLARY - Abnormal; Notable for the following components:   Glucose-Capillary 501 (*)    All other components within normal limits  GLUCOSE, CAPILLARY - Abnormal; Notable for the following components:   Glucose-Capillary 469 (*)    All other components within normal limits  ____________________________________________   CRITICAL CARE Performed by: Ulice Dash   Total critical care time: 30 minutes  Critical care time was exclusive of separately billable procedures and treating other patients.  Critical care was necessary to treat or prevent imminent or life-threatening deterioration.  Critical care was time spent personally by me on the following activities: development of treatment plan with patient and/or surrogate as well as nursing, discussions with consultants, evaluation of patient's response to treatment, examination of patient, obtaining history from patient or surrogate, ordering and performing treatments and interventions, ordering and review of laboratory studies, ordering and review of radiographic studies, pulse oximetry and re-evaluation of patient's condition.  CXR: Chest x-ray  IMPRESSION:  Chronic interstitial lung disease. No acute cardiopulmonary process.   DIFFERENTIAL DIAGNOSIS   Anxiety, allergic reaction, hypoxia, anemia, TIA, CVA  FINAL ASSESSMENT AND PLAN  Dizziness, hyperglycemic hyperosmolar state   Plan: The patient had presented for nonspecific symptoms that he was concerned were related to allergic reaction.  He was given IV Benadryl here.  Patient's labs did reveal marked hyperglycemia and pseudohyponatremia.  He was started on fluid and  insulin.  He is adamantly refusing hospital admission.  We were finally able to bring his blood sugar down and start him on Metformin.  Again he is leaving AGAINST MEDICAL ADVICE.  Ulice Dash, MD    Note: This note was generated in part or whole with voice recognition software. Voice recognition is usually quite accurate but there are transcription errors that can and very often do occur. I apologize for any typographical errors that were not detected and corrected.     Shane Filbert, MD 04/09/19 9509    Shane Filbert, MD 04/09/19 1024    Shane Filbert, MD 04/09/19 1136

## 2019-04-09 NOTE — ED Notes (Signed)
Pt has phone at bedside and is trying to get in touch w/ his PCP.

## 2019-04-09 NOTE — ED Notes (Addendum)
Pt very addiment about leaving and that he has to take care of his wife.  Explained to pt the danger of his blood sugar and how high it is and could die from it this high.  Pt states "I have to take care of my wife, she needs a bath right now and food".  Offered to see if social work was able to get her some help today if he would stay if I look into this, pt states "no I have to leave".  Informed patient that if he dies at home there will no longer be anyone to take care of his wife and pt states "well then I guess social services will get involved".

## 2019-04-09 NOTE — ED Notes (Signed)
RN at bedside to give pt water.  Pt had coughing fit. Now labored with tachypnea. Breath sounds WNL bilateral except diminished at upper posterior.

## 2019-04-09 NOTE — ED Triage Notes (Signed)
Arrives stating he is having an allergic reaction to a prednisone shot he was given yesterday by Pulmonologist.  Patient is AAOx3.  Skin warm and dry.  No sob/ doe.  Gait unsteady.  Placed in a wheelchair.

## 2019-04-12 ENCOUNTER — Other Ambulatory Visit: Payer: Self-pay

## 2019-04-12 ENCOUNTER — Encounter: Payer: Self-pay | Admitting: Emergency Medicine

## 2019-04-12 ENCOUNTER — Emergency Department
Admission: EM | Admit: 2019-04-12 | Discharge: 2019-04-12 | Disposition: A | Discharge status: Home / Self Care | Payer: Medicare Other | Attending: Emergency Medicine | Admitting: Emergency Medicine

## 2019-04-12 DIAGNOSIS — I2510 Atherosclerotic heart disease of native coronary artery without angina pectoris: Secondary | ICD-10-CM | POA: Diagnosis not present

## 2019-04-12 DIAGNOSIS — Z760 Encounter for issue of repeat prescription: Secondary | ICD-10-CM | POA: Insufficient documentation

## 2019-04-12 DIAGNOSIS — I509 Heart failure, unspecified: Secondary | ICD-10-CM | POA: Insufficient documentation

## 2019-04-12 DIAGNOSIS — Z87891 Personal history of nicotine dependence: Secondary | ICD-10-CM | POA: Diagnosis not present

## 2019-04-12 DIAGNOSIS — E1069 Type 1 diabetes mellitus with other specified complication: Secondary | ICD-10-CM

## 2019-04-12 DIAGNOSIS — Z79899 Other long term (current) drug therapy: Secondary | ICD-10-CM | POA: Insufficient documentation

## 2019-04-12 DIAGNOSIS — E1065 Type 1 diabetes mellitus with hyperglycemia: Secondary | ICD-10-CM | POA: Insufficient documentation

## 2019-04-12 DIAGNOSIS — I110 Hypertensive heart disease with heart failure: Secondary | ICD-10-CM | POA: Insufficient documentation

## 2019-04-12 DIAGNOSIS — J449 Chronic obstructive pulmonary disease, unspecified: Secondary | ICD-10-CM | POA: Insufficient documentation

## 2019-04-12 DIAGNOSIS — Z7982 Long term (current) use of aspirin: Secondary | ICD-10-CM | POA: Insufficient documentation

## 2019-04-12 HISTORY — DX: Type 2 diabetes mellitus without complications: E11.9

## 2019-04-12 LAB — GLUCOSE, CAPILLARY: Glucose-Capillary: 410 mg/dL — ABNORMAL HIGH (ref 70–99)

## 2019-04-12 NOTE — ED Notes (Signed)
Patient has been seen and advised by Dr. Cinda Quest. Patient exhibits good understanding. DC with instructions and RX 2.

## 2019-04-12 NOTE — ED Provider Notes (Signed)
[facility] Emergency Department Provider Note   ____________________________________________   First MD Initiated Contact with Patient 04/12/19 1553     (approximate)  I have reviewed the triage vital signs and the nursing notes.   HISTORY  Chief Complaint Medication Refill    HPI Shane Write. is a 73 y.o. male patient is a newly diagnosed diabetic.  He is seeing his primary care doctor and has a glucometer but does not have enough fingerstick equipment the strips and lancets etc. he is coming in for a prescription for those.  His sugar is 400 but he says he cannot stay because he has a handicapped person at home he is responsible taking care of.  Please see his past medical history.         Past Medical History:  Diagnosis Date  . Anxiety   . Asthma   . Bradycardia   . CHF (congestive heart failure) (HCC)   . Chronic Chest Pain   . COPD (chronic obstructive pulmonary disease) (HCC)   . Coronary artery disease    a. 2008 s/p PCI to LAD;  b. 2013 Cath: patent stent->Med Rx;  c. 11/2013 MV: EF 67%, no ischemia/infarct.  . Depression   . Diabetes mellitus without complication (HCC)   . GERD (gastroesophageal reflux disease)   . Heart murmur    a. 02/2010 Echo: EF 55-60%, no rwma, Gr 2 DD, triv MR, mildly dil LA, nl RV.  [name] Hypercholesteremia   . Hypertension   . Hypertensive heart disease   . Idiopathic pulmonary fibrosis (HCC)    a. Seen by Dr. Dema Severin 01/2015 and Rx Esbriet - pt cannot afford.  . Other social stressor    a. wife with mental illness    Patient Active Problem List   Diagnosis Date Noted  . Hypertension 04/09/2018  . Pain in limb 04/09/2018  . 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 Benign localized hyperplasia of prostate with urinary obstruction 03/01/2017  . Incomplete emptying of bladder 03/01/2017  . Scrotal pain 09/11/2016  . Oropharyngeal dysphagia 09/21/2015  . Hypertensive heart disease   . Chronic Chest Pain   .  Ischemic heart disease   . Hypercholesteremia   . 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 IPF (idiopathic pulmonary fibrosis) (HCC) 12/23/2014  . Pre-operative cardiovascular examination 11/05/2014  . ILD (interstitial lung disease) (HCC) 09/04/2014  . Interstitial lung disease (HCC) 06/22/2014  . Upper respiratory infection 12/03/2013  . 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 CAROTID BRUIT, LEFT 05/12/2010  . Hyperlipidemia 07/01/2009  . CAD, NATIVE VESSEL 07/01/2009  . SHORTNESS OF BREATH 07/01/2009  . Atypical chest pain 07/01/2009  . Family history of early CAD 01/23/2007  . Anxiety and depression 02/02/2005    Past Surgical History:  Procedure Laterality Date  . CARDIAC CATHETERIZATION    . CORONARY ANGIOPLASTY     stents times 4  . Esophagus stretch    . EYE SURGERY     Bilateral cataracts  . LUNG BIOPSY    . NASAL ENDOSCOPY N/A 12/21/2016   Procedure: NASAL ENDOSCOPY;  Surgeon: Vernie Murders, MD;  Location: [facility];  Service: ENT;  Laterality: N/A;  . TESTICLE SURGERY    . VIDEO ASSISTED THORACOSCOPY (VATS)/THOROCOTOMY Right 11/18/2014   Procedure: VIDEO ASSISTED THORACOSCOPY (VATS)/ wedge resection biopsy;  Surgeon: Hulda Marin, MD;  Location: [facility];  Service: General;  Laterality: Right;    Prior to Admission medications   Medication Sig Start Date  End Date Taking? Authorizing Provider  acyclovir (ZOVIRAX) 800 MG tablet Take 800 mg by mouth 5 (five) times daily. For 7 days 01/23/19   [provider]  albuterol (PROVENTIL) (2.5 MG/3ML) 0.083% nebulizer solution Take 3 mLs (2.5 mg total) by nebulization every 6 (six) hours as needed. 05/02/12   Minna Merritts, MD  albuterol (VENTOLIN HFA) 108 (90 Base) MCG/ACT inhaler Inhale 2 puffs into the lungs every 4 (four) hours as needed for wheezing. 01/22/06   [provider]  amLODipine-benazepril (LOTREL) 5-10 MG capsule TAKE 1 CAPSULE BY MOUTH EVERY DAY  05/20/18   Minna Merritts, MD  amoxicillin-clavulanate (AUGMENTIN) 875-125 MG tablet Take 1 tablet by mouth 2 (two) times daily. 03/05/19   Paulette Blanch, MD  aspirin EC 81 MG tablet Take 81 mg by mouth every morning. 08/06/12   [provider]  azithromycin (ZITHROMAX) 250 MG tablet Take 1 tablet (250 mg total) by mouth daily. 03/05/19   Paulette Blanch, MD  benzonatate (TESSALON PERLES) 100 MG capsule Take 1 capsule (100 mg total) by mouth 3 (three) times daily as needed for cough. 02/01/19 02/01/20  Nena Polio, MD  benzonatate (TESSALON) 100 MG capsule Take 100-200 mg by mouth 3 (three) times daily as needed for cough. 01/22/19   [provider]  chlorpheniramine-HYDROcodone (TUSSIONEX PENNKINETIC ER) 10-8 MG/5ML SUER Take 5 mLs by mouth 2 (two) times daily. 03/05/19   Paulette Blanch, MD  gabapentin (NEURONTIN) 300 MG capsule TAKE 1 TAB BY MOUTH 3 TIMES A DAY FOR NERVE PAIN 03/07/18   [provider]  lidocaine (XYLOCAINE) 5 % ointment Apply 1 application topically as needed. 02/10/19   [name], Charline Bills, PA-C  metFORMIN (GLUCOPHAGE) 500 MG tablet Take 1 tablet (500 mg total) by mouth 2 (two) times daily with a meal. 04/09/19 04/08/20  Earleen Newport, MD  Multiple Vitamins-Minerals (CENTRUM SILVER PO) Take by mouth daily.    [provider]  nitroGLYCERIN (NITROSTAT) 0.4 MG SL tablet Place 1 tablet (0.4 mg total) under the tongue every 5 (five) minutes as needed. 07/30/17   Minna Merritts, MD  nystatin (MYCOSTATIN/NYSTOP) powder Apply 1 application topically 4 (four) times daily. 01/10/19   [provider]  pantoprazole (PROTONIX) 40 MG tablet Take 40 mg by mouth daily. 12/19/18   [provider]  predniSONE (DELTASONE) 20 MG tablet 3 tablets PO qd x 4 days 03/05/19   Paulette Blanch, MD  rosuvastatin (CRESTOR) 10 MG tablet Take 1 tablet (10 mg total) by mouth daily. 09/05/18   Minna Merritts, MD  traMADol (ULTRAM) 50 MG tablet Take 1 tablet (50  mg total) by mouth every 12 (twelve) hours as needed for moderate pain. 02/13/19   Sable Feil, PA-C  triamcinolone cream (KENALOG) 0.1 % Apply 1 application topically 4 (four) times daily. 02/10/19   [name], Charline Bills, PA-C    Allergies Metoprolol succinate, Niacin, and Sertraline hcl  Family History  Problem Relation Age of Onset  . Coronary artery disease Mother        s/p CABG  . Heart failure Mother   . Heart disease Other   . Depression Other     Social History Social History   Tobacco Use  . Smoking status: Former Smoker    Packs/day: 0.50    Years: 1.00    Pack years: 0.50    Types: Cigarettes    Quit date: 05/09/1968    Years since quitting: 50.9  .  Smokeless tobacco: Never Used  Substance Use Topics  . Alcohol use: No  . Drug use: No    Review of Systems  Constitutional: No fever/chills Eyes: No visual changes. ENT: No sore throat. Cardiovascular: Denies chest pain. Respiratory: Denies any worsening of baseline shortness of breath. Gastrointestinal: No abdominal pain.  No nausea, no vomiting.  No diarrhea.  No constipation. Genitourinary: Negative for dysuria. Musculoskeletal: Negative for back pain. Skin: Negative for rash. Neurological: Negative for headaches, focal weakness  ____________________________________________   PHYSICAL EXAM:  VITAL SIGNS: ED Triage Vitals  Enc Vitals Group     BP 04/12/19 1538 140/81     Pulse Rate 04/12/19 1538 85     Resp 04/12/19 1538 18     Temp 04/12/19 1538 97.7 F (36.5 C)     Temp Source 04/12/19 1538 Oral     SpO2 04/12/19 1538 98 %     Weight 04/12/19 1539 146 lb (66.2 kg)     Height 04/12/19 1539 5\' 9"  (1.753 m)     Head Circumference --      Peak Flow --      Pain Score 04/12/19 1538 0     Pain Loc --      Pain Edu? --      Excl. in GC? --     Constitutional: Alert and oriented. Well appearing and in no acute distress. Eyes: Conjunctivae are normal.  Head: Atraumatic. Nose: No  congestion/rhinnorhea. Mouth/Throat: Mucous membranes are moist.  Oropharynx non-erythematous. Neck: No stridor. Cardiovascular: Good peripheral circulation. Respiratory: Chronically increased respiratory effort.  No retractions.  Musculoskeletal: No lower extremity tenderness nor edema.   Neurologic:  Normal speech and language. No gross focal neurologic deficits are appreciated. No gait instability. Skin:  Skin is warm, dry and intact. No rash noted. Psychiatric: Mood and affect are normal. Speech and behavior are normal.  ____________________________________________   LABS (all labs ordered are listed, but only abnormal results are displayed)  Labs Reviewed  GLUCOSE, CAPILLARY - Abnormal; Notable for the following components:      Result Value   Glucose-Capillary 410 (*)    All other components within normal limits   ____________________________________________  EKG   ____________________________________________  RADIOLOGY  ED MD interpretation:    Official radiology report(s): No results found.  ____________________________________________   PROCEDURES  Procedure(s) performed (including Critical Care):  Procedures   ____________________________________________   INITIAL IMPRESSION / ASSESSMENT AND PLAN / ED COURSE  Patient given prescriptions for lancets glucometer strips and alcohol wipes.  Discussed with him signs of low blood sugar.  He is taking Metformin at thousand twice a day and another medicine for his sugar.  He will do his fingersticks 4 times a day and follow-up with his primary care doctor.  He will return if he has any problems.              ____________________________________________   FINAL CLINICAL IMPRESSION(S) / ED DIAGNOSES  Final diagnoses:  Medication refill     ED Discharge Orders    None       Note:  This document was prepared using Dragon voice recognition software and may include unintentional dictation errors.     04/14/19, MD 04/12/19 [phone]

## 2019-04-12 NOTE — ED Triage Notes (Addendum)
Patient newly diagnosed diabetic. States got a glucometer from doctor however does not have strips and cannot get strips from pharmacy without one. Here for prescription for glucometer strips. Patient states if his blood sugar stays under 500 he is managing it at home. Noted 410 in triage. Has been started on medication.

## 2019-04-12 NOTE — Discharge Instructions (Signed)
Please do your fingersticks 4 times a day for now before meals and before bed.  Keep a written log of the results.  Call your doctor and arrange follow-up.  Return here please if you have trouble keeping your sugars below 500.  Let your doctor know quickly if you cannot get your sugars below 400.  Do not hesitate to return here if you need help with something.        Remember the signs of low blood sugar or rapid heartbeat, sweating and jitteriness.  If you have have any of these things happen eat some sugar or sugary liquid right away and then check your fingerstick.

## 2019-05-15 ENCOUNTER — Other Ambulatory Visit: Payer: Self-pay | Admitting: Cardiovascular Disease

## 2019-06-13 ENCOUNTER — Emergency Department: Payer: Medicare Other

## 2019-06-13 ENCOUNTER — Emergency Department
Admission: EM | Admit: 2019-06-13 | Discharge: 2019-06-13 | Disposition: A | Discharge status: Home / Self Care | Payer: Medicare Other | Attending: Student | Admitting: Student

## 2019-06-13 ENCOUNTER — Other Ambulatory Visit: Payer: Self-pay

## 2019-06-13 DIAGNOSIS — J449 Chronic obstructive pulmonary disease, unspecified: Secondary | ICD-10-CM | POA: Diagnosis not present

## 2019-06-13 DIAGNOSIS — E119 Type 2 diabetes mellitus without complications: Secondary | ICD-10-CM | POA: Insufficient documentation

## 2019-06-13 DIAGNOSIS — Z7984 Long term (current) use of oral hypoglycemic drugs: Secondary | ICD-10-CM | POA: Diagnosis not present

## 2019-06-13 DIAGNOSIS — I110 Hypertensive heart disease with heart failure: Secondary | ICD-10-CM | POA: Diagnosis not present

## 2019-06-13 DIAGNOSIS — I2510 Atherosclerotic heart disease of native coronary artery without angina pectoris: Secondary | ICD-10-CM | POA: Insufficient documentation

## 2019-06-13 DIAGNOSIS — J84112 Idiopathic pulmonary fibrosis: Secondary | ICD-10-CM | POA: Diagnosis not present

## 2019-06-13 DIAGNOSIS — R0789 Other chest pain: Secondary | ICD-10-CM | POA: Diagnosis present

## 2019-06-13 DIAGNOSIS — Z79899 Other long term (current) drug therapy: Secondary | ICD-10-CM | POA: Diagnosis not present

## 2019-06-13 DIAGNOSIS — I509 Heart failure, unspecified: Secondary | ICD-10-CM | POA: Insufficient documentation

## 2019-06-13 DIAGNOSIS — J8410 Pulmonary fibrosis, unspecified: Secondary | ICD-10-CM

## 2019-06-13 NOTE — ED Provider Notes (Signed)
[facility] Emergency Department Provider Note  ____________________________________________   First MD Initiated Contact with Patient 06/13/19 1449     (approximate)  I have reviewed the triage vital signs and the nursing notes.   HISTORY  Chief Complaint Lung Screening    HPI Shane Reid. is a 74 y.o. male presents emergency department stating that his lungs hurt.  Patient has history of pulmonary fibrosis.  He states his doctor gave him a new inhaler yesterday.  He states he keeps complaining about it and his wife told him to come emergency department to get checked out in case we can do something else.  He denies cardiac type chest pain, fever, chills, vomiting or diarrhea.    Past Medical History:  Diagnosis Date  . Anxiety   . Asthma   . Bradycardia   . CHF (congestive heart failure) ([place])   . Chronic Chest Pain   . COPD (chronic obstructive pulmonary disease) ([place])   . Coronary artery disease    a. 2008 s/p PCI to LAD;  b. 2013 Cath: patent stent->Med Rx;  c. 11/2013 MV: EF 67%, no ischemia/infarct.  . Depression   . Diabetes mellitus without complication ([place])   . GERD (gastroesophageal reflux disease)   . Heart murmur    a. 02/2010 Echo: EF 55-60%, no rwma, Gr 2 DD, triv MR, mildly dil LA, nl RV.  [name] Hypercholesteremia   . Hypertension   . Hypertensive heart disease   . Idiopathic pulmonary fibrosis ([place])    a. Seen by Dr. Stevenson Clinch 01/2015 and Rx Esbriet - pt cannot afford.  . Other social stressor    a. wife with mental illness    Patient Active Problem List   Diagnosis Date Noted  . Hypertension 04/09/2018  . Pain in limb 04/09/2018  . 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 Benign localized hyperplasia of prostate with urinary obstruction 03/01/2017  . Incomplete emptying of bladder 03/01/2017  . Scrotal pain 09/11/2016  . Oropharyngeal dysphagia 09/21/2015  . Hypertensive heart disease   . Chronic Chest Pain   . Ischemic heart  disease   . Hypercholesteremia   . 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 IPF (idiopathic pulmonary fibrosis) ([place]) 12/23/2014  . Pre-operative cardiovascular examination 11/05/2014  . ILD (interstitial lung disease) ([place]) 09/04/2014  . Interstitial lung disease ([place]) 06/22/2014  . Upper respiratory infection 12/03/2013  . 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 CAROTID BRUIT, LEFT 05/12/2010  . Hyperlipidemia 07/01/2009  . CAD, NATIVE VESSEL 07/01/2009  . SHORTNESS OF BREATH 07/01/2009  . Atypical chest pain 07/01/2009  . Family history of early CAD 01/23/2007  . Anxiety and depression 02/02/2005    Past Surgical History:  Procedure Laterality Date  . CARDIAC CATHETERIZATION    . CORONARY ANGIOPLASTY     stents times 4  . Esophagus stretch    . EYE SURGERY     Bilateral cataracts  . LUNG BIOPSY    . NASAL ENDOSCOPY N/A 12/21/2016   Procedure: NASAL ENDOSCOPY;  Surgeon: Margaretha Sheffield, MD;  Location: [place];  Service: ENT;  Laterality: N/A;  . TESTICLE SURGERY    . VIDEO ASSISTED THORACOSCOPY (VATS)/THOROCOTOMY Right 11/18/2014   Procedure: VIDEO ASSISTED THORACOSCOPY (VATS)/ wedge resection biopsy;  Surgeon: Nestor Lewandowsky, MD;  Location: [facility];  Service: General;  Laterality: Right;    Prior to Admission medications   Medication Sig Start Date End Date Taking? Authorizing Provider  acyclovir (ZOVIRAX) 800 MG tablet  Take 800 mg by mouth 5 (five) times daily. For 7 days 01/23/19   [provider]  albuterol (PROVENTIL) (2.5 MG/3ML) 0.083% nebulizer solution Take 3 mLs (2.5 mg total) by nebulization every 6 (six) hours as needed. 05/02/12   Antonieta Iba, MD  albuterol (VENTOLIN HFA) 108 (90 Base) MCG/ACT inhaler Inhale 2 puffs into the lungs every 4 (four) hours as needed for wheezing. 01/22/06   [provider]  amLODipine-benazepril (LOTREL) 5-10 MG capsule TAKE 1 CAPSULE BY MOUTH EVERY DAY 05/15/19   Antonieta Iba, MD  amoxicillin-clavulanate (AUGMENTIN) 875-125 MG tablet Take 1 tablet by mouth 2 (two) times daily. 03/05/19   Irean Hong, MD  aspirin EC 81 MG tablet Take 81 mg by mouth every morning. 08/06/12   [provider]  azithromycin (ZITHROMAX) 250 MG tablet Take 1 tablet (250 mg total) by mouth daily. 03/05/19   Irean Hong, MD  benzonatate (TESSALON PERLES) 100 MG capsule Take 1 capsule (100 mg total) by mouth 3 (three) times daily as needed for cough. 02/01/19 02/01/20  Arnaldo Natal, MD  benzonatate (TESSALON) 100 MG capsule Take 100-200 mg by mouth 3 (three) times daily as needed for cough. 01/22/19   [provider]  chlorpheniramine-HYDROcodone (TUSSIONEX PENNKINETIC ER) 10-8 MG/5ML SUER Take 5 mLs by mouth 2 (two) times daily. 03/05/19   Irean Hong, MD  gabapentin (NEURONTIN) 300 MG capsule TAKE 1 TAB BY MOUTH 3 TIMES A DAY FOR NERVE PAIN 03/07/18   [provider]  lidocaine (XYLOCAINE) 5 % ointment Apply 1 application topically as needed. 02/10/19   [name], Delorise Royals, PA-C  metFORMIN (GLUCOPHAGE) 500 MG tablet Take 1 tablet (500 mg total) by mouth 2 (two) times daily with a meal. 04/09/19 04/08/20  Emily Filbert, MD  Multiple Vitamins-Minerals (CENTRUM SILVER PO) Take by mouth daily.    [provider]  nitroGLYCERIN (NITROSTAT) 0.4 MG SL tablet Place 1 tablet (0.4 mg total) under the tongue every 5 (five) minutes as needed. 07/30/17   Antonieta Iba, MD  nystatin (MYCOSTATIN/NYSTOP) powder Apply 1 application topically 4 (four) times daily. 01/10/19   [provider]  pantoprazole (PROTONIX) 40 MG tablet Take 40 mg by mouth daily. 12/19/18   [provider]  predniSONE (DELTASONE) 20 MG tablet 3 tablets PO qd x 4 days 03/05/19   Irean Hong, MD  rosuvastatin (CRESTOR) 10 MG tablet Take 1 tablet (10 mg total) by mouth daily. 09/05/18   Antonieta Iba, MD  traMADol (ULTRAM) 50 MG tablet Take 1 tablet (50 mg total) by mouth  every 12 (twelve) hours as needed for moderate pain. 02/13/19   Joni Reining, PA-C  triamcinolone cream (KENALOG) 0.1 % Apply 1 application topically 4 (four) times daily. 02/10/19   [name], Delorise Royals, PA-C    Allergies Metoprolol succinate, Niacin, and Sertraline hcl  Family History  Problem Relation Age of Onset  . Coronary artery disease Mother        s/p CABG  . Heart failure Mother   . Heart disease Other   . Depression Other     Social History Social History   Tobacco Use  . Smoking status: Former Smoker    Packs/day: 0.50    Years: 1.00    Pack years: 0.50    Types: Cigarettes    Quit date: 05/09/1968    Years since quitting: 51.1  . Smokeless tobacco: Never Used  Substance Use Topics  . Alcohol  use: No  . Drug use: No    Review of Systems  Constitutional: No fever/chills Eyes: No visual changes. ENT: No sore throat. Respiratory: Denies cough, positive for lung pain Cardiovascular: Denies chest pain Gastrointestinal: Denies abdominal pain Genitourinary: Negative for dysuria. Musculoskeletal: Negative for back pain. Skin: Negative for rash. Psychiatric: no mood changes,     ____________________________________________   PHYSICAL EXAM:  VITAL SIGNS: ED Triage Vitals  Enc Vitals Group     BP 06/13/19 1419 134/80     Pulse Rate 06/13/19 1419 80     Resp 06/13/19 1419 18     Temp 06/13/19 1419 97.8 F (36.6 C)     Temp Source 06/13/19 1419 Oral     SpO2 06/13/19 1419 96 %     Weight 06/13/19 1420 160 lb (72.6 kg)     Height 06/13/19 1420 5\' 9"  (1.753 m)     Head Circumference --      Peak Flow --      Pain Score 06/13/19 1419 5     Pain Loc --      Pain Edu? --      Excl. in GC? --     Constitutional: Alert and oriented. Well appearing and in no acute distress. Eyes: Conjunctivae are normal.  Head: Atraumatic. Nose: No congestion/rhinnorhea. Mouth/Throat: Mucous membranes are moist.   Neck:  supple no lymphadenopathy noted  Cardiovascular: Normal rate, regular rhythm. Heart sounds are normal Respiratory: Normal respiratory effort.  No retractions, lungs c t a  GU: deferred Musculoskeletal: FROM all extremities, warm and well perfused Neurologic:  Normal speech and language.  Skin:  Skin is warm, dry and intact. No rash noted. Psychiatric: Mood and affect are normal. Speech and behavior are normal.  ____________________________________________   LABS (all labs ordered are listed, but only abnormal results are displayed)  Labs Reviewed - No data to display ____________________________________________   ____________________________________________  RADIOLOGY  Chest x-ray does not show any acute changes  ____________________________________________   PROCEDURES  Procedure(s) performed: No  Procedures    ____________________________________________   INITIAL IMPRESSION / ASSESSMENT AND PLAN / ED COURSE  Pertinent labs & imaging results that were available during my care of the patient were reviewed by me and considered in my medical decision making (see chart for details).   Patient 74 year old male presents emergency department complaining of lung pain.  Patient has history of pulmonary fibrosis and was given a new inhaler yesterday by his pulmonologist.  See HPI  Physical exam patient appears well.  Vitals stable.  Lungs are basically at baseline for the patient and appear to be clear.  Chest x-ray does not show any acute changes  Explained everything to the patient.  Explained to him he needs to follow back up with his pulmonologist.  He states continue to use the new inhaler and give it more than 1 day to see if it is working.  Return emergency department if worsening.  He states he understands and will comply.  Is discharged stable condition.    Shane Reid. was evaluated in Emergency Department on 06/13/2019 for the symptoms described in the history of present illness. He was  evaluated in the context of the global COVID-19 pandemic, which necessitated consideration that the patient might be at risk for infection with the SARS-CoV-2 virus that causes COVID-19. Institutional protocols and algorithms that pertain to the evaluation of patients at risk for COVID-19 are in a state of rapid change based on information released  by regulatory bodies including the CDC and federal and state organizations. These policies and algorithms were followed during the patient's care in the ED.   As part of my medical decision making, I reviewed the following data within the electronic MEDICAL RECORD NUMBER Nursing notes reviewed and incorporated, Old chart reviewed, Radiograph reviewed see above, Notes from prior ED visits and [place] Controlled Substance Database  ____________________________________________   FINAL CLINICAL IMPRESSION(S) / ED DIAGNOSES  Final diagnoses:  Pulmonary fibrosis (HCC)      NEW MEDICATIONS STARTED DURING THIS VISIT:  New Prescriptions   No medications on file     Note:  This document was prepared using Dragon voice recognition software and may include unintentional dictation errors.    Faythe Ghee, PA-C 06/13/19 1548    Miguel Aschoff., MD 06/13/19 [phone]

## 2019-06-13 NOTE — ED Notes (Addendum)
Esign not working pt verbalized discharge instructions and has no questions at this time 

## 2019-06-13 NOTE — Discharge Instructions (Addendum)
Follow-up with your regular doctor.  Please call for appointment.  Return if worsening

## 2019-06-13 NOTE — ED Triage Notes (Signed)
Pt states that he was seen yesterday by his pulmonologist who started him on a new inhaler. Pt states he has pulmonary fibrosis and reports daily lung pain for years. No distress noted at this time

## 2019-07-07 ENCOUNTER — Emergency Department: Payer: Medicare Other

## 2019-07-07 ENCOUNTER — Other Ambulatory Visit: Payer: Self-pay

## 2019-07-07 ENCOUNTER — Emergency Department
Admission: EM | Admit: 2019-07-07 | Discharge: 2019-07-07 | Disposition: A | Discharge status: Home / Self Care | Payer: Medicare Other | Attending: Emergency Medicine | Admitting: Emergency Medicine

## 2019-07-07 ENCOUNTER — Encounter: Payer: Self-pay | Admitting: *Deleted

## 2019-07-07 DIAGNOSIS — J84112 Idiopathic pulmonary fibrosis: Secondary | ICD-10-CM | POA: Insufficient documentation

## 2019-07-07 DIAGNOSIS — R0602 Shortness of breath: Secondary | ICD-10-CM | POA: Diagnosis present

## 2019-07-07 DIAGNOSIS — Z20822 Contact with and (suspected) exposure to covid-19: Secondary | ICD-10-CM | POA: Diagnosis not present

## 2019-07-07 LAB — CBC
HCT: 40.8 % (ref 39.0–52.0)
Hemoglobin: 13.2 g/dL (ref 13.0–17.0)
MCH: 27.6 pg (ref 26.0–34.0)
MCHC: 32.4 g/dL (ref 30.0–36.0)
MCV: 85.2 fL (ref 80.0–100.0)
Platelets: 273 10*3/uL (ref 150–400)
RBC: 4.79 MIL/uL (ref 4.22–5.81)
RDW: 13 % (ref 11.5–15.5)
WBC: 9.9 10*3/uL (ref 4.0–10.5)
nRBC: 0 % (ref 0.0–0.2)

## 2019-07-07 LAB — BASIC METABOLIC PANEL
Anion gap: 11 (ref 5–15)
BUN: 12 mg/dL (ref 8–23)
CO2: 24 mmol/L (ref 22–32)
Calcium: 9.4 mg/dL (ref 8.9–10.3)
Chloride: 103 mmol/L (ref 98–111)
Creatinine, Ser: 1 mg/dL (ref 0.61–1.24)
GFR calc Af Amer: >60 mL/min (ref >60)
GFR calc non Af Amer: >60 mL/min (ref >60)
Glucose, Bld: 98 mg/dL (ref 70–99)
Potassium: 4.1 mmol/L (ref 3.5–5.1)
Sodium: 138 mmol/L (ref 135–145)

## 2019-07-07 LAB — BRAIN NATRIURETIC PEPTIDE: B Natriuretic Peptide: 35 pg/mL (ref 0.0–100.0)

## 2019-07-07 LAB — PROCALCITONIN: Procalcitonin: <0.1 ng/mL

## 2019-07-07 LAB — TROPONIN I (HIGH SENSITIVITY)
Troponin I (High Sensitivity): 3 ng/L (ref <18)
Troponin I (High Sensitivity): 3 ng/L (ref <18)

## 2019-07-07 LAB — POC SARS CORONAVIRUS 2 AG: SARS Coronavirus 2 Ag: NEGATIVE

## 2019-07-07 MED ORDER — AMOXICILLIN-POT CLAVULANATE 875-125 MG PO TABS
1.0000 | ORAL_TABLET | Freq: Once | ORAL | Status: AC
  Administered 2019-07-07: 1 via ORAL
  Filled 2019-07-07: qty 1

## 2019-07-07 MED ORDER — HYDROCOD POLST-CPM POLST ER 10-8 MG/5ML PO SUER
5.0000 mL | Freq: Once | ORAL | Status: AC
  Administered 2019-07-07: 5 mL via ORAL
  Filled 2019-07-07: qty 5

## 2019-07-07 MED ORDER — BENZONATATE 100 MG PO CAPS: 100.0000 mg | ORAL_CAPSULE | Freq: Three times a day (TID) | ORAL | 0 refills | Status: DC | PRN

## 2019-07-07 MED ORDER — AMOXICILLIN-POT CLAVULANATE 875-125 MG PO TABS: 1.0000 | ORAL_TABLET | Freq: Two times a day (BID) | ORAL | 0 refills | Status: AC

## 2019-07-07 MED ORDER — IPRATROPIUM-ALBUTEROL 0.5-2.5 (3) MG/3ML IN SOLN
3.0000 mL | Freq: Once | RESPIRATORY_TRACT | Status: AC
  Administered 2019-07-07: 3 mL via RESPIRATORY_TRACT
  Filled 2019-07-07: qty 3

## 2019-07-07 MED ORDER — HYDROCOD POLST-CPM POLST ER 10-8 MG/5ML PO SUER: 5.0000 mL | Freq: Two times a day (BID) | ORAL | 0 refills | Status: AC | PRN

## 2019-07-07 MED ORDER — BENZONATATE 100 MG PO CAPS
200.0000 mg | ORAL_CAPSULE | Freq: Once | ORAL | Status: AC
  Administered 2019-07-07: 200 mg via ORAL
  Filled 2019-07-07: qty 2

## 2019-07-07 MED ORDER — IOHEXOL 350 MG/ML SOLN
75.0000 mL | Freq: Once | INTRAVENOUS | Status: AC | PRN
  Administered 2019-07-07: 75 mL via INTRAVENOUS

## 2019-07-07 NOTE — ED Provider Notes (Signed)
w  [facility] Emergency Department Provider Note  ____________________________________________   First MD Initiated Contact with Patient 07/07/19 2039     (approximate)  I have reviewed the triage vital signs and the nursing notes.   HISTORY  Chief Complaint Hemoptysis and Shortness of Breath    HPI Shane Reid. is a 74 y.o. male with extensive past medical history as below including chronic pulmonary fibrosis, CHF, hypertension, hyperlipidemia, COPD, here with cough, worsening shortness of breath, and hypoxia.  The patient states that over the last several days, he has had progressively worsening cough, which she describes as persistently worsened but intermittently coming in paroxysms with associated sputum production and sensation like he cannot breathe.  He has had some lightheadedness during these episodes.  He states that he has felt like his food is been getting stuck when he is swallowing, then he begins coughing and has these episodes.  Denies any chest pain.  Denies any known fevers.  He does note that he is also had to increase his oxygen, which she normally only uses with exertion, but now said to use occasionally at rest.  No known sick contacts.  No known Covid exposures.        Past Medical History:  Diagnosis Date  . Anxiety   . Asthma   . Bradycardia   . CHF (congestive heart failure) (HCC)   . Chronic Chest Pain   . COPD (chronic obstructive pulmonary disease) (HCC)   . Coronary artery disease    a. 2008 s/p PCI to LAD;  b. 2013 Cath: patent stent->Med Rx;  c. 11/2013 MV: EF 67%, no ischemia/infarct.  . Depression   . Diabetes mellitus without complication (HCC)   . GERD (gastroesophageal reflux disease)   . Heart murmur    a. 02/2010 Echo: EF 55-60%, no rwma, Gr 2 DD, triv MR, mildly dil LA, nl RV.  [name] Hypercholesteremia   . Hypertension   . Hypertensive heart disease   . Idiopathic pulmonary fibrosis (HCC)    a. Seen by Dr.  Dema Severin 01/2015 and Rx Esbriet - pt cannot afford.  . Other social stressor    a. wife with mental illness    Patient Active Problem List   Diagnosis Date Noted  . Hypertension 04/09/2018  . Pain in limb 04/09/2018  . 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 Benign localized hyperplasia of prostate with urinary obstruction 03/01/2017  . Incomplete emptying of bladder 03/01/2017  . Scrotal pain 09/11/2016  . Oropharyngeal dysphagia 09/21/2015  . Hypertensive heart disease   . Chronic Chest Pain   . Ischemic heart disease   . Hypercholesteremia   . 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 IPF (idiopathic pulmonary fibrosis) (HCC) 12/23/2014  . Pre-operative cardiovascular examination 11/05/2014  . ILD (interstitial lung disease) (HCC) 09/04/2014  . Interstitial lung disease (HCC) 06/22/2014  . Upper respiratory infection 12/03/2013  . 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 CAROTID BRUIT, LEFT 05/12/2010  . Hyperlipidemia 07/01/2009  . CAD, NATIVE VESSEL 07/01/2009  . SHORTNESS OF BREATH 07/01/2009  . Atypical chest pain 07/01/2009  . Family history of early CAD 01/23/2007  . Anxiety and depression 02/02/2005    Past Surgical History:  Procedure Laterality Date  . CARDIAC CATHETERIZATION    . CORONARY ANGIOPLASTY     stents times 4  . Esophagus stretch    . EYE SURGERY     Bilateral cataracts  . LUNG BIOPSY    . NASAL ENDOSCOPY  N/A 12/21/2016   Procedure: NASAL ENDOSCOPY;  Surgeon: Vernie Murders, MD;  Location: [facility];  Service: ENT;  Laterality: N/A;  . TESTICLE SURGERY    . VIDEO ASSISTED THORACOSCOPY (VATS)/THOROCOTOMY Right 11/18/2014   Procedure: VIDEO ASSISTED THORACOSCOPY (VATS)/ wedge resection biopsy;  Surgeon: Hulda Marin, MD;  Location: [facility];  Service: General;  Laterality: Right;    Prior to Admission medications   Medication Sig Start Date End Date Taking? Authorizing Provider  acyclovir (ZOVIRAX) 800 MG tablet Take 800  mg by mouth 5 (five) times daily. For 7 days 01/23/19   [provider]  albuterol (PROVENTIL) (2.5 MG/3ML) 0.083% nebulizer solution Take 3 mLs (2.5 mg total) by nebulization every 6 (six) hours as needed. 05/02/12   Antonieta Iba, MD  albuterol (VENTOLIN HFA) 108 (90 Base) MCG/ACT inhaler Inhale 2 puffs into the lungs every 4 (four) hours as needed for wheezing. 01/22/06   [provider]  amLODipine-benazepril (LOTREL) 5-10 MG capsule TAKE 1 CAPSULE BY MOUTH EVERY DAY 05/15/19   Antonieta Iba, MD  amoxicillin-clavulanate (AUGMENTIN) 875-125 MG tablet Take 1 tablet by mouth 2 (two) times daily for 7 days. 07/07/19 07/14/19  Shaune Pollack, MD  aspirin EC 81 MG tablet Take 81 mg by mouth every morning. 08/06/12   [provider]  benzonatate (TESSALON PERLES) 100 MG capsule Take 1 capsule (100 mg total) by mouth 3 (three) times daily as needed for cough. 07/07/19 07/06/20  Shaune Pollack, MD  chlorpheniramine-HYDROcodone (TUSSIONEX PENNKINETIC ER) 10-8 MG/5ML SUER Take 5 mLs by mouth every 12 (twelve) hours as needed for up to 10 days for cough. 07/07/19 07/17/19  Shaune Pollack, MD  gabapentin (NEURONTIN) 300 MG capsule TAKE 1 TAB BY MOUTH 3 TIMES A DAY FOR NERVE PAIN 03/07/18   [provider]  lidocaine (XYLOCAINE) 5 % ointment Apply 1 application topically as needed. 02/10/19   [name], Delorise Royals, PA-C  metFORMIN (GLUCOPHAGE) 500 MG tablet Take 1 tablet (500 mg total) by mouth 2 (two) times daily with a meal. 04/09/19 04/08/20  Emily Filbert, MD  Multiple Vitamins-Minerals (CENTRUM SILVER PO) Take by mouth daily.    [provider]  nitroGLYCERIN (NITROSTAT) 0.4 MG SL tablet Place 1 tablet (0.4 mg total) under the tongue every 5 (five) minutes as needed. 07/30/17   Antonieta Iba, MD  nystatin (MYCOSTATIN/NYSTOP) powder Apply 1 application topically 4 (four) times daily. 01/10/19   [provider]  pantoprazole (PROTONIX) 40 MG tablet  Take 40 mg by mouth daily. 12/19/18   [provider]  predniSONE (DELTASONE) 20 MG tablet 3 tablets PO qd x 4 days 03/05/19   Irean Hong, MD  rosuvastatin (CRESTOR) 10 MG tablet Take 1 tablet (10 mg total) by mouth daily. 09/05/18   Antonieta Iba, MD  traMADol (ULTRAM) 50 MG tablet Take 1 tablet (50 mg total) by mouth every 12 (twelve) hours as needed for moderate pain. 02/13/19   Joni Reining, PA-C  triamcinolone cream (KENALOG) 0.1 % Apply 1 application topically 4 (four) times daily. 02/10/19   [name], Delorise Royals, PA-C    Allergies Metoprolol succinate, Niacin, and Sertraline hcl  Family History  Problem Relation Age of Onset  . Coronary artery disease Mother        s/p CABG  . Heart failure Mother   . Heart disease Other   . Depression Other     Social History Social History   Tobacco Use  . Smoking status: Former  Smoker    Packs/day: 0.50    Years: 1.00    Pack years: 0.50    Types: Cigarettes    Quit date: 05/09/1968    Years since quitting: 51.1  . Smokeless tobacco: Never Used  Substance Use Topics  . Alcohol use: No  . Drug use: No    Review of Systems  Review of Systems  Constitutional: Positive for fatigue. Negative for chills and fever.  HENT: Negative for sore throat.   Respiratory: Positive for cough, shortness of breath and wheezing.   Cardiovascular: Negative for chest pain.  Gastrointestinal: Negative for abdominal pain.  Genitourinary: Negative for flank pain.  Musculoskeletal: Negative for neck pain.  Skin: Negative for rash and wound.  Allergic/Immunologic: Negative for immunocompromised state.  Neurological: Negative for weakness and numbness.  Hematological: Does not bruise/bleed easily.     ____________________________________________  PHYSICAL EXAM:      VITAL SIGNS: ED Triage Vitals [07/07/19 1921]  Enc Vitals Group     BP (!) 159/90     Pulse Rate (!) 47     Resp (!) 22     Temp 99.1 F (37.3 C)     Temp Source  Oral     SpO2 94 %     Weight 160 lb 0.9 oz (72.6 kg)     Height      Head Circumference      Peak Flow      Pain Score 0     Pain Loc      Pain Edu?      Excl. in GC?      Physical Exam Vitals and nursing note reviewed.  Constitutional:      General: He is not in acute distress.    Appearance: He is well-developed.  HENT:     Head: Normocephalic and atraumatic.  Eyes:     Conjunctiva/sclera: Conjunctivae normal.  Neck:     Comments: No appreciable neck swelling.  No stridor.  No adenopathy.  No bruits. Cardiovascular:     Rate and Rhythm: Normal rate and regular rhythm.     Heart sounds: Normal heart sounds.  Pulmonary:     Effort: Tachypnea and accessory muscle usage present. No respiratory distress.     Breath sounds: Examination of the right-middle field reveals rales. Examination of the left-middle field reveals rales. Examination of the right-lower field reveals rales. Examination of the left-lower field reveals rales. Decreased breath sounds, wheezing and rales present.  Abdominal:     General: There is no distension.  Musculoskeletal:     Cervical back: Neck supple.  Skin:    General: Skin is warm.     Capillary Refill: Capillary refill takes less than 2 seconds.     Findings: No rash.  Neurological:     Mental Status: He is alert and oriented to person, place, and time.     Motor: No abnormal muscle tone.       ____________________________________________   LABS (all labs ordered are listed, but only abnormal results are displayed)  Labs Reviewed  CBC  BASIC METABOLIC PANEL  BRAIN NATRIURETIC PEPTIDE  PROCALCITONIN  POC SARS CORONAVIRUS 2 AG -  ED  POC SARS CORONAVIRUS 2 AG  TROPONIN I (HIGH SENSITIVITY)  TROPONIN I (HIGH SENSITIVITY)    ____________________________________________  EKG: Normal sinus rhythm, ventricular rate 79.  PR 142, QRS 82, QTc 412.  No acute ST elevations or  depressions. ________________________________________  RADIOLOGY All imaging, including plain films, CT scans, and ultrasounds,  independently reviewed by me, and interpretations confirmed via formal radiology reads.  ED MD interpretation:   Chest x-ray: Severe chronic lung disease/fibrosis, no acute abnormality  Official radiology report(s): DG Chest 2 View  Result Date: 07/07/2019 CLINICAL DATA:  Cough EXAM: CHEST - 2 VIEW COMPARISON:  06/13/2019 FINDINGS: Heart is normal size. Severe chronic lung disease/fibrosis changes again noted, stable. No acute confluent opacities or effusions. No acute bony abnormality. IMPRESSION: Severe chronic lung disease/fibrosis. No acute cardiopulmonary disease. Electronically Signed   By: Rolm Baptise M.D.   On: 07/07/2019 19:48   CT Angio Chest PE W and/or Wo Contrast  Result Date: 07/07/2019 CLINICAL DATA:  Cough and sputum EXAM: CT ANGIOGRAPHY CHEST WITH CONTRAST TECHNIQUE: Multidetector CT imaging of the chest was performed using the standard protocol during bolus administration of intravenous contrast. Multiplanar CT image reconstructions and MIPs were obtained to evaluate the vascular anatomy. CONTRAST:  54mL OMNIPAQUE IOHEXOL 350 MG/ML SOLN COMPARISON:  Radiograph same day, CT chest June 11, 2017 FINDINGS: Cardiovascular: There is a optimal opacification of the pulmonary arteries. There is no central,segmental, or subsegmental filling defects within the pulmonary arteries. The heart is normal in size. No pericardial effusion or thickening. No evidence right heart strain. A LAD coronary artery stent is seen. Scattered atherosclerosis seen within the aorta. There is mild prominence of the left main pulmonary artery which could be due to pulmonary arterial hypertension. There is normal three-vessel brachiocephalic anatomy without proximal stenosis. Mediastinum/Nodes: Scattered pre-vascular subcarinal and left hilar lymph nodes are seen the largest in the  subcarinal region measuring 1.4 cm, grossly unchanged from prior exam. Thyroid gland, trachea, and esophagus demonstrate no significant findings. Lungs/Pleura: Again noted are diffuse reticulonodular opacities, bronchiectasis, and honeycombing seen throughout both lungs, predominantly within the lower lobes. There has been interval progression since the prior exam. There is also mild diffuse hazy ground-glass opacities throughout both lungs, predominantly within the lower lungs. No large airspace consolidation or pleural effusion. Upper Abdomen: Scattered small hypodense lesions are seen within the right hepatic dome. Musculoskeletal: No chest wall abnormality. No acute or significant osseous findings. Review of the MIP images confirms the above findings. IMPRESSION: 1. Interval progression in the interstitial chronic lung changes. There is also diffuse hazy ground-glass opacities now seen at both lung bases which could be due to worsening interstitial lung disease or superimposed infectious etiology. 2. No central, segmental, or subsegmental pulmonary arteries. 3. Findings suggestive of pulmonary arterial hypertension. 4.  Aortic Atherosclerosis (ICD10-I70.0). Electronically Signed   By: Prudencio Pair M.D.   On: 07/07/2019 22:32    ____________________________________________  PROCEDURES   Procedure(s) performed (including Critical Care):  Procedures  ____________________________________________  INITIAL IMPRESSION / MDM / [place] / ED COURSE  As part of my medical decision making, I reviewed the following data within the [place] notes reviewed and incorporated, Old chart reviewed, Notes from prior ED visits, and [place] [employer] [place] [place]. was evaluated in Emergency Department on 07/08/2019 for the symptoms described in the history of present illness. He was evaluated in the context of the global COVID-19 pandemic, which  necessitated consideration that the patient might be at risk for infection with the SARS-CoV-2 virus that causes COVID-19. Institutional protocols and algorithms that pertain to the evaluation of patients at risk for COVID-19 are in a state of rapid change based on information released by regulatory bodies including the CDC and federal and state  organizations. These policies and algorithms were followed during the patient's care in the ED.  Some ED evaluations and interventions may be delayed as a result of limited staffing during the pandemic.*  Clinical Course as of Jul 08 43  Mon Jul 07, 2019  2159 74 yo M with extensive PMHx as above including chronic pulmonary fibrosis on 2L  w/ exertion here with worsening paroxysms of cough, SOB, and hypoxia. He is dyspneic on arrival, occasionally desatting to 89 when talking, though talking in full sentences. CXR w/o focal abnormality though this is difficult to assess 2/2 his severe underlying disease. Will check CT angio for further assessment and evaluation of PE. DDx includes atypical or viral PNA, pulm fibrosis or COPD exacerbation or possibly aspiration PNA.   [CI]    Clinical Course User Index [CI] Shaune Pollack, MD    Medical Decision Making:  As above. 74 yo M here with acute on chronic SOB. CT Angio obtained as above, shows no PE but ? Superimposed infection. Procal neg. Doubt bacterial PNA. Labs are o/w very reassuring.  Discussed mild hypoxia, labs, and recommendation for admission. Pt has sick wife at home and is adamant to return home. He does have home O2 and is o/w well appearing, speaking in full sentences. Given his ? History of aspiration, will start augmentin, encourage adherence with his steroid inhaler, and have him call his pulmonologist in AM. Given BG>900 afte rrecent steroids, will hold on empiric systemic steroids. Return precautions given.  ____________________________________________  FINAL CLINICAL IMPRESSION(S) / ED  DIAGNOSES  Final diagnoses:  IPF (idiopathic pulmonary fibrosis) (HCC)     MEDICATIONS GIVEN DURING THIS VISIT:  Medications  chlorpheniramine-HYDROcodone (TUSSIONEX) 10-8 MG/5ML suspension 5 mL (5 mLs Oral Given 07/07/19 2121)  benzonatate (TESSALON) capsule 200 mg (200 mg Oral Given 07/07/19 2122)  ipratropium-albuterol (DUONEB) 0.5-2.5 (3) MG/3ML nebulizer solution 3 mL (3 mLs Nebulization Given 07/07/19 2124)  iohexol (OMNIPAQUE) 350 MG/ML injection 75 mL (75 mLs Intravenous Contrast Given 07/07/19 2213)  amoxicillin-clavulanate (AUGMENTIN) 875-125 MG per tablet 1 tablet (1 tablet Oral Given 07/07/19 2300)     ED Discharge Orders         Ordered    benzonatate (TESSALON PERLES) 100 MG capsule  3 times daily PRN     07/07/19 2252    chlorpheniramine-HYDROcodone (TUSSIONEX PENNKINETIC ER) 10-8 MG/5ML SUER  Every 12 hours PRN     07/07/19 2252    amoxicillin-clavulanate (AUGMENTIN) 875-125 MG tablet  2 times daily     07/07/19 2252           Note:  This document was prepared using Dragon voice recognition software and may include unintentional dictation errors.   Shaune Pollack, MD 07/08/19 [phone]

## 2019-07-07 NOTE — Discharge Instructions (Addendum)
START the antibiotic as prescribed  Use your inhaler Markus Daft) as 2 PUFFS twice a day  In between doses, you can use ALBUTEROL inhaler to help with cough and wheezing. You can use this 2 puffs every 4 hours.  CALL DR. ALESKEROV TOMORROW TO DISCUSS YOUR ER VISIT AND CT FINDINGS

## 2019-07-07 NOTE — ED Notes (Signed)
Patient to CT.

## 2019-07-07 NOTE — ED Notes (Signed)
MD at bedside. 

## 2019-07-07 NOTE — ED Notes (Signed)
Patient states he has congestions when he lays down and feels as though it collects in the throat. Patient states when he coughs it up sometimes there is slight tinges of blood in the mucus

## 2019-07-07 NOTE — ED Triage Notes (Addendum)
Pt to ED with a cough and white sputum for the past couple days. Pt reports multiple episodes of coughing up small amounts of bright red blood. Hx of Pulmonary Fibrosis. Increased SOB reported especially upon exertion. Pt presents to ED wearing 2L of oxygen but reports he has been turning O2 to 3L when walking.   No chest pain but rib pain reported when coughing. Pt denies being exposed to COVID. No recent test.

## 2019-07-15 ENCOUNTER — Other Ambulatory Visit: Payer: Self-pay | Admitting: Otolaryngology

## 2019-07-15 DIAGNOSIS — R1313 Dysphagia, pharyngeal phase: Secondary | ICD-10-CM

## 2019-07-22 ENCOUNTER — Other Ambulatory Visit: Payer: Self-pay

## 2019-07-22 ENCOUNTER — Encounter: Payer: Self-pay | Admitting: Emergency Medicine

## 2019-07-22 ENCOUNTER — Emergency Department: Payer: Medicare Other

## 2019-07-22 ENCOUNTER — Inpatient Hospital Stay
Admission: EM | Admit: 2019-07-22 | Discharge: 2019-07-22 | DRG: 871 | Discharge status: Against Medical Advice | Payer: Medicare Other | Attending: Internal Medicine | Admitting: Internal Medicine

## 2019-07-22 ENCOUNTER — Ambulatory Visit: Payer: Medicare Other

## 2019-07-22 DIAGNOSIS — Z9119 Patient's noncompliance with other medical treatment and regimen: Secondary | ICD-10-CM

## 2019-07-22 DIAGNOSIS — Z9981 Dependence on supplemental oxygen: Secondary | ICD-10-CM | POA: Diagnosis not present

## 2019-07-22 DIAGNOSIS — E119 Type 2 diabetes mellitus without complications: Secondary | ICD-10-CM | POA: Diagnosis present

## 2019-07-22 DIAGNOSIS — Z8249 Family history of ischemic heart disease and other diseases of the circulatory system: Secondary | ICD-10-CM | POA: Diagnosis not present

## 2019-07-22 DIAGNOSIS — I10 Essential (primary) hypertension: Secondary | ICD-10-CM | POA: Diagnosis present

## 2019-07-22 DIAGNOSIS — J440 Chronic obstructive pulmonary disease with acute lower respiratory infection: Secondary | ICD-10-CM | POA: Diagnosis present

## 2019-07-22 DIAGNOSIS — J441 Chronic obstructive pulmonary disease with (acute) exacerbation: Secondary | ICD-10-CM

## 2019-07-22 DIAGNOSIS — Z79899 Other long term (current) drug therapy: Secondary | ICD-10-CM | POA: Diagnosis not present

## 2019-07-22 DIAGNOSIS — F419 Anxiety disorder, unspecified: Secondary | ICD-10-CM | POA: Diagnosis present

## 2019-07-22 DIAGNOSIS — I2510 Atherosclerotic heart disease of native coronary artery without angina pectoris: Secondary | ICD-10-CM | POA: Diagnosis present

## 2019-07-22 DIAGNOSIS — J84112 Idiopathic pulmonary fibrosis: Secondary | ICD-10-CM

## 2019-07-22 DIAGNOSIS — Z818 Family history of other mental and behavioral disorders: Secondary | ICD-10-CM

## 2019-07-22 DIAGNOSIS — J849 Interstitial pulmonary disease, unspecified: Secondary | ICD-10-CM | POA: Diagnosis present

## 2019-07-22 DIAGNOSIS — Z7982 Long term (current) use of aspirin: Secondary | ICD-10-CM | POA: Diagnosis not present

## 2019-07-22 DIAGNOSIS — F329 Major depressive disorder, single episode, unspecified: Secondary | ICD-10-CM | POA: Diagnosis present

## 2019-07-22 DIAGNOSIS — A419 Sepsis, unspecified organism: Principal | ICD-10-CM | POA: Diagnosis present

## 2019-07-22 DIAGNOSIS — J9621 Acute and chronic respiratory failure with hypoxia: Secondary | ICD-10-CM | POA: Diagnosis present

## 2019-07-22 DIAGNOSIS — J189 Pneumonia, unspecified organism: Secondary | ICD-10-CM | POA: Diagnosis present

## 2019-07-22 DIAGNOSIS — Z20822 Contact with and (suspected) exposure to covid-19: Secondary | ICD-10-CM | POA: Diagnosis present

## 2019-07-22 DIAGNOSIS — Z7984 Long term (current) use of oral hypoglycemic drugs: Secondary | ICD-10-CM | POA: Diagnosis not present

## 2019-07-22 DIAGNOSIS — R0902 Hypoxemia: Secondary | ICD-10-CM

## 2019-07-22 DIAGNOSIS — Z87891 Personal history of nicotine dependence: Secondary | ICD-10-CM | POA: Diagnosis not present

## 2019-07-22 DIAGNOSIS — R0603 Acute respiratory distress: Secondary | ICD-10-CM

## 2019-07-22 LAB — COMPREHENSIVE METABOLIC PANEL
ALT: 17 U/L (ref 0–44)
AST: 31 U/L (ref 15–41)
Albumin: 3.9 g/dL (ref 3.5–5.0)
Alkaline Phosphatase: 59 U/L (ref 38–126)
Anion gap: 11 (ref 5–15)
BUN: 14 mg/dL (ref 8–23)
CO2: 26 mmol/L (ref 22–32)
Calcium: 9 mg/dL (ref 8.9–10.3)
Chloride: 104 mmol/L (ref 98–111)
Creatinine, Ser: 1.11 mg/dL (ref 0.61–1.24)
GFR calc Af Amer: >60 mL/min (ref >60)
GFR calc non Af Amer: >60 mL/min (ref >60)
Glucose, Bld: 186 mg/dL — ABNORMAL HIGH (ref 70–99)
Potassium: 4.1 mmol/L (ref 3.5–5.1)
Sodium: 141 mmol/L (ref 135–145)
Total Bilirubin: 0.6 mg/dL (ref 0.3–1.2)
Total Protein: 7.6 g/dL (ref 6.5–8.1)

## 2019-07-22 LAB — CBC WITH DIFFERENTIAL/PLATELET
Abs Immature Granulocytes: 0.12 10*3/uL — ABNORMAL HIGH (ref 0.00–0.07)
Basophils Absolute: 0.1 10*3/uL (ref 0.0–0.1)
Basophils Relative: 1 %
Eosinophils Absolute: 0.3 10*3/uL (ref 0.0–0.5)
Eosinophils Relative: 1 %
HCT: 44.6 % (ref 39.0–52.0)
Hemoglobin: 14.2 g/dL (ref 13.0–17.0)
Immature Granulocytes: 1 %
Lymphocytes Relative: 14 %
Lymphs Abs: 3.5 10*3/uL (ref 0.7–4.0)
MCH: 27.7 pg (ref 26.0–34.0)
MCHC: 31.8 g/dL (ref 30.0–36.0)
MCV: 86.9 fL (ref 80.0–100.0)
Monocytes Absolute: 1.6 10*3/uL — ABNORMAL HIGH (ref 0.1–1.0)
Monocytes Relative: 7 %
Neutro Abs: 18.8 10*3/uL — ABNORMAL HIGH (ref 1.7–7.7)
Neutrophils Relative %: 76 %
Platelets: 350 10*3/uL (ref 150–400)
RBC: 5.13 MIL/uL (ref 4.22–5.81)
RDW: 13.1 % (ref 11.5–15.5)
WBC: 24.4 10*3/uL — ABNORMAL HIGH (ref 4.0–10.5)
nRBC: 0 % (ref 0.0–0.2)

## 2019-07-22 LAB — RESPIRATORY PANEL BY RT PCR (FLU A&B, COVID)
Influenza A by PCR: NEGATIVE
Influenza B by PCR: NEGATIVE
SARS Coronavirus 2 by RT PCR: NEGATIVE

## 2019-07-22 LAB — BLOOD GAS, ARTERIAL
Acid-base deficit: 3 mmol/L — ABNORMAL HIGH (ref 0.0–2.0)
Bicarbonate: 21.1 mmol/L (ref 20.0–28.0)
Delivery systems: POSITIVE
Expiratory PAP: 5
FIO2: 0.5
Inspiratory PAP: 12
Mechanical Rate: 8
O2 Saturation: 98.8 %
Patient temperature: 37
RATE: 8 resp/min
pCO2 arterial: 34 mmHg (ref 32.0–48.0)
pH, Arterial: 7.4 (ref 7.350–7.450)
pO2, Arterial: 124 mmHg — ABNORMAL HIGH (ref 83.0–108.0)

## 2019-07-22 LAB — LACTIC ACID, PLASMA
Lactic Acid, Venous: 5.3 mmol/L (ref 0.5–1.9)
Lactic Acid, Venous: 1.7 mmol/L (ref 0.5–1.9)

## 2019-07-22 LAB — TROPONIN I (HIGH SENSITIVITY)
Troponin I (High Sensitivity): 7 ng/L (ref <18)
Troponin I (High Sensitivity): 11 ng/L (ref <18)

## 2019-07-22 LAB — PROCALCITONIN: Procalcitonin: 0.21 ng/mL

## 2019-07-22 LAB — POC SARS CORONAVIRUS 2 AG: SARS Coronavirus 2 Ag: NEGATIVE

## 2019-07-22 MED ORDER — LORAZEPAM 2 MG/ML IJ SOLN
INTRAMUSCULAR | Status: AC
  Administered 2019-07-22: 0.5 mg via INTRAVENOUS
  Filled 2019-07-22: qty 1

## 2019-07-22 MED ORDER — IPRATROPIUM-ALBUTEROL 0.5-2.5 (3) MG/3ML IN SOLN
3.0000 mL | Freq: Once | RESPIRATORY_TRACT | Status: AC
  Administered 2019-07-22: 03:00:00 3 mL via RESPIRATORY_TRACT

## 2019-07-22 MED ORDER — ACETAMINOPHEN 650 MG RE SUPP
975.0000 mg | Freq: Once | RECTAL | Status: AC
  Administered 2019-07-22: 975 mg via RECTAL
  Filled 2019-07-22: qty 1

## 2019-07-22 MED ORDER — IPRATROPIUM-ALBUTEROL 0.5-2.5 (3) MG/3ML IN SOLN: 3.0000 mL | Freq: Four times a day (QID) | RESPIRATORY_TRACT | Status: DC

## 2019-07-22 MED ORDER — ACETAMINOPHEN 500 MG PO TABS: 1000.0000 mg | ORAL_TABLET | Freq: Once | ORAL | Status: DC

## 2019-07-22 MED ORDER — SODIUM CHLORIDE 0.9 % IV SOLN: 2.0000 g | INTRAVENOUS | Status: DC

## 2019-07-22 MED ORDER — SODIUM CHLORIDE 0.9 % IV SOLN
500.0000 mg | INTRAVENOUS | Status: DC
  Administered 2019-07-22: 05:00:00 500 mg via INTRAVENOUS

## 2019-07-22 MED ORDER — ACETAMINOPHEN 650 MG RE SUPP: 650.0000 mg | Freq: Four times a day (QID) | RECTAL | Status: DC | PRN

## 2019-07-22 MED ORDER — LORAZEPAM 2 MG/ML IJ SOLN: 0.5000 mg | Freq: Once | INTRAMUSCULAR | Status: AC

## 2019-07-22 MED ORDER — LACTATED RINGERS IV BOLUS (SEPSIS)
1000.0000 mL | Freq: Once | INTRAVENOUS | Status: AC
  Administered 2019-07-22: 05:00:00 1000 mL via INTRAVENOUS

## 2019-07-22 MED ORDER — INSULIN ASPART 100 UNIT/ML ~~LOC~~ SOLN: 0.0000 [IU] | Freq: Three times a day (TID) | SUBCUTANEOUS | Status: DC

## 2019-07-22 MED ORDER — METHYLPREDNISOLONE SODIUM SUCC 125 MG IJ SOLR: 60.0000 mg | Freq: Four times a day (QID) | INTRAMUSCULAR | Status: DC

## 2019-07-22 MED ORDER — ALBUTEROL SULFATE (2.5 MG/3ML) 0.083% IN NEBU: 2.5000 mg | INHALATION_SOLUTION | RESPIRATORY_TRACT | Status: DC | PRN

## 2019-07-22 MED ORDER — SENNOSIDES-DOCUSATE SODIUM 8.6-50 MG PO TABS: 1.0000 | ORAL_TABLET | Freq: Every evening | ORAL | Status: DC | PRN

## 2019-07-22 MED ORDER — METHYLPREDNISOLONE SODIUM SUCC 125 MG IJ SOLR
125.0000 mg | Freq: Once | INTRAMUSCULAR | Status: AC
  Administered 2019-07-22: 04:00:00 125 mg via INTRAVENOUS
  Filled 2019-07-22: qty 2

## 2019-07-22 MED ORDER — AMOXICILLIN-POT CLAVULANATE 875-125 MG PO TABS: 1.0000 | ORAL_TABLET | Freq: Two times a day (BID) | ORAL | 0 refills | Status: DC

## 2019-07-22 MED ORDER — SODIUM CHLORIDE 0.9 % IV BOLUS (SEPSIS): 1000.0000 mL | Freq: Once | INTRAVENOUS | Status: DC

## 2019-07-22 MED ORDER — AZITHROMYCIN 250 MG PO TABS: 250.0000 mg | ORAL_TABLET | Freq: Every day | ORAL | 0 refills | Status: DC

## 2019-07-22 MED ORDER — LACTATED RINGERS IV BOLUS (SEPSIS): 500.0000 mL | Freq: Once | INTRAVENOUS | Status: DC

## 2019-07-22 MED ORDER — ACETAMINOPHEN 325 MG PO TABS: 650.0000 mg | ORAL_TABLET | Freq: Four times a day (QID) | ORAL | Status: DC | PRN

## 2019-07-22 MED ORDER — ONDANSETRON HCL 4 MG/2ML IJ SOLN: 4.0000 mg | Freq: Four times a day (QID) | INTRAMUSCULAR | Status: DC | PRN

## 2019-07-22 MED ORDER — LACTATED RINGERS IV BOLUS (SEPSIS)
1000.0000 mL | Freq: Once | INTRAVENOUS | Status: AC
  Administered 2019-07-22: 04:00:00 1000 mL via INTRAVENOUS

## 2019-07-22 MED ORDER — INSULIN ASPART 100 UNIT/ML ~~LOC~~ SOLN: 0.0000 [IU] | Freq: Every day | SUBCUTANEOUS | Status: DC

## 2019-07-22 MED ORDER — SODIUM CHLORIDE 0.9 % IV SOLN
2.0000 g | INTRAVENOUS | Status: DC
  Administered 2019-07-22: 04:00:00 2 g via INTRAVENOUS
  Filled 2019-07-22: qty 20

## 2019-07-22 MED ORDER — PREDNISONE 20 MG PO TABS: 40.0000 mg | ORAL_TABLET | Freq: Every day | ORAL | Status: DC

## 2019-07-22 MED ORDER — IPRATROPIUM-ALBUTEROL 0.5-2.5 (3) MG/3ML IN SOLN
3.0000 mL | Freq: Once | RESPIRATORY_TRACT | Status: AC
  Administered 2019-07-22: 3 mL via RESPIRATORY_TRACT
  Filled 2019-07-22: qty 9

## 2019-07-22 MED ORDER — ONDANSETRON HCL 4 MG PO TABS: 4.0000 mg | ORAL_TABLET | Freq: Four times a day (QID) | ORAL | Status: DC | PRN

## 2019-07-22 MED ORDER — SODIUM CHLORIDE 0.9 % IV SOLN
500.0000 mg | INTRAVENOUS | Status: DC
  Filled 2019-07-22: qty 500

## 2019-07-22 MED ORDER — ENOXAPARIN SODIUM 40 MG/0.4ML ~~LOC~~ SOLN: 40.0000 mg | SUBCUTANEOUS | Status: DC

## 2019-07-22 NOTE — ED Notes (Signed)
Pt resting more quietly and calmly with bi-pap in place. Tolerating well.

## 2019-07-22 NOTE — Discharge Instructions (Addendum)
You are leaving AGAINST MEDICAL ADVICE.  You are doing so with the understanding that you will likely become very sick and have a high chance of stopping breathing and death.  You are also putting your wife's health at risk because you have a high fever.  Please return should you change your mind.  Take antibiotics as prescribed.  Return to the ER for worsening symptoms, persistent vomiting, difficulty breathing or other concerns.

## 2019-07-22 NOTE — ED Notes (Addendum)
Pt prepared for transport to CCU and pt states he will not be staying at the hospital. Pt states he will be going home and he needs to get back to his wife. Pt educated of risks involved not staying in the hospital with his current symptoms. Pt verbalizes understanding and argues that he does not care and will be going home and not staying at this hospital. Pt states he is feeling better and refuses to stay.

## 2019-07-22 NOTE — ED Provider Notes (Signed)
[facility] Emergency Department Provider Note   ____________________________________________   First MD Initiated Contact with Patient 07/22/19 [phone]     (approximate)  I have reviewed the triage vital signs and the nursing notes.   HISTORY  Chief Complaint Shortness of Breath    HPI Shane Reid. is a 74 y.o. male who presents to the ED from home with a chief complaint of shortness of breath.  Patient has a history of COPD, IPF on 3 L continuous nasal cannula oxygen.  Complains of worsening shortness of breath over the past 2 to 3 days.  Using nebulizer treatments at home without relief of symptoms.  Patient drove himself to the ED but did not bring his oxygen with them.  Room air saturations on arrival 72%.  Complains of chronic cough but no acute exacerbation.  Seen in the ED 2/8 for acute on chronic SLB.  CT chest demonstrated no PE.  Seen by pulmonology 2/16 who will try patient on Ofev twice daily.  Patient had been noncompliant with Esbriet.  Denies fever, chest pain, abdominal pain, nausea, vomiting, diarrhea.       Past Medical History:  Diagnosis Date  . Anxiety   . Asthma   . Bradycardia   . CHF (congestive heart failure) (HCC)   . Chronic Chest Pain   . COPD (chronic obstructive pulmonary disease) (HCC)   . Coronary artery disease    a. 2008 s/p PCI to LAD;  b. 2013 Cath: patent stent->Med Rx;  c. 11/2013 MV: EF 67%, no ischemia/infarct.  . Depression   . Diabetes mellitus without complication (HCC)   . GERD (gastroesophageal reflux disease)   . Heart murmur    a. 02/2010 Echo: EF 55-60%, no rwma, Gr 2 DD, triv MR, mildly dil LA, nl RV.  [name] Hypercholesteremia   . Hypertension   . Hypertensive heart disease   . Idiopathic pulmonary fibrosis (HCC)    a. Seen by Dr. Dema Severin 01/2015 and Rx Esbriet - pt cannot afford.  . Other social stressor    a. wife with mental illness    Patient Active Problem List   Diagnosis Date Noted  .  Acute on chronic respiratory failure with hypoxia (HCC) 07/22/2019  . Sepsis (HCC) 07/22/2019  . Community acquired pneumonia 07/22/2019  . COPD with acute exacerbation (HCC) 07/22/2019  . Type 2 diabetes mellitus without complication (HCC) 07/22/2019  . Hypertension 04/09/2018  . Pain in limb 04/09/2018  . 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 Benign localized hyperplasia of prostate with urinary obstruction 03/01/2017  . Incomplete emptying of bladder 03/01/2017  . Scrotal pain 09/11/2016  . Oropharyngeal dysphagia 09/21/2015  . Hypertensive heart disease   . Chronic Chest Pain   . Ischemic heart disease   . Hypercholesteremia   . 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 IPF (idiopathic pulmonary fibrosis) (HCC) 12/23/2014  . Pre-operative cardiovascular examination 11/05/2014  . ILD (interstitial lung disease) (HCC) 09/04/2014  . Interstitial lung disease (HCC) 06/22/2014  . Upper respiratory infection 12/03/2013  . 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 CAROTID BRUIT, LEFT 05/12/2010  . Hyperlipidemia 07/01/2009  . CAD, NATIVE VESSEL 07/01/2009  . SHORTNESS OF BREATH 07/01/2009  . Atypical chest pain 07/01/2009  . Family history of early CAD 01/23/2007  . Anxiety and depression 02/02/2005    Past Surgical History:  Procedure Laterality Date  . CARDIAC CATHETERIZATION    . CORONARY ANGIOPLASTY     stents times 4  .  Esophagus stretch    . EYE SURGERY     Bilateral cataracts  . LUNG BIOPSY    . NASAL ENDOSCOPY N/A 12/21/2016   Procedure: NASAL ENDOSCOPY;  Surgeon: Vernie Murders, MD;  Location: [facility];  Service: ENT;  Laterality: N/A;  . TESTICLE SURGERY    . VIDEO ASSISTED THORACOSCOPY (VATS)/THOROCOTOMY Right 11/18/2014   Procedure: VIDEO ASSISTED THORACOSCOPY (VATS)/ wedge resection biopsy;  Surgeon: Hulda Marin, MD;  Location: [facility];  Service: General;  Laterality: Right;    Prior to Admission medications   Medication Sig  Start Date End Date Taking? Authorizing Provider  albuterol (PROVENTIL) (2.5 MG/3ML) 0.083% nebulizer solution Take 3 mLs (2.5 mg total) by nebulization every 6 (six) hours as needed. 05/02/12  Yes Gollan, Tollie Pizza, MD  albuterol (VENTOLIN HFA) 108 (90 Base) MCG/ACT inhaler Inhale 2 puffs into the lungs every 4 (four) hours as needed for wheezing. 01/22/06  Yes [provider]  amLODipine-benazepril (LOTREL) 5-10 MG capsule TAKE 1 CAPSULE BY MOUTH EVERY DAY 05/15/19  Yes Gollan, Tollie Pizza, MD  aspirin EC 81 MG tablet Take 81 mg by mouth every morning. 08/06/12  Yes [provider]  glipiZIDE (GLUCOTROL XL) 10 MG 24 hr tablet Take 10 mg by mouth 2 (two) times daily. 04/11/19  Yes [provider]  lidocaine (XYLOCAINE) 5 % ointment Apply 1 application topically as needed. 02/10/19  Yes [name], Delorise Royals, PA-C  metFORMIN (GLUCOPHAGE) 500 MG tablet Take 1 tablet (500 mg total) by mouth 2 (two) times daily with a meal. Patient taking differently: Take 1,000 mg by mouth 2 (two) times daily with a meal.  04/09/19 04/08/20 Yes Emily Filbert, MD  Multiple Vitamins-Minerals (CENTRUM SILVER PO) Take by mouth daily.   Yes [provider]  nitroGLYCERIN (NITROSTAT) 0.4 MG SL tablet Place 1 tablet (0.4 mg total) under the tongue every 5 (five) minutes as needed. 07/30/17  Yes Gollan, Tollie Pizza, MD  pantoprazole (PROTONIX) 40 MG tablet Take 40 mg by mouth daily. 12/19/18  Yes [provider]  pregabalin (LYRICA) 75 MG capsule Take 75 mg by mouth 3 (three) times daily. 05/01/19  Yes [provider]  rosuvastatin (CRESTOR) 10 MG tablet Take 1 tablet (10 mg total) by mouth daily. 09/05/18  Yes Gollan, Tollie Pizza, MD  acyclovir (ZOVIRAX) 800 MG tablet Take 800 mg by mouth 5 (five) times daily. For 7 days 01/23/19   [provider]  amoxicillin-clavulanate (AUGMENTIN) 875-125 MG tablet Take 1 tablet by mouth 2 (two) times daily. 07/22/19   Irean Hong, MD    azithromycin (ZITHROMAX) 250 MG tablet Take 1 tablet (250 mg total) by mouth daily. 07/22/19   Irean Hong, MD  ESBRIET 267 MG TABS Take 267 mg by mouth 3 (three) times daily. 02/18/19   [provider]  Nintedanib (OFEV) 150 MG CAPS Take 150 mg by mouth 2 (two) times daily. 07/15/19   [provider]  nystatin (MYCOSTATIN/NYSTOP) powder Apply 1 application topically 4 (four) times daily. 01/10/19   [provider]  triamcinolone cream (KENALOG) 0.1 % Apply 1 application topically 4 (four) times daily. Patient not taking: Reported on 07/22/2019 02/10/19   [name], Delorise Royals, PA-C    Allergies Metoprolol succinate, Niacin, and Sertraline hcl  Family History  Problem Relation Age of Onset  . Coronary artery disease Mother        s/p CABG  . Heart failure Mother   . Heart disease Other   . Depression  Other     Social History Social History   Tobacco Use  . Smoking status: Former Smoker    Packs/day: 0.50    Years: 1.00    Pack years: 0.50    Types: Cigarettes    Quit date: 05/09/1968    Years since quitting: 51.2  . Smokeless tobacco: Never Used  Substance Use Topics  . Alcohol use: No  . Drug use: No    Review of Systems  Constitutional: No fever/chills Eyes: No visual changes. ENT: No sore throat. Cardiovascular: Denies chest pain. Respiratory: Positive for shortness of breath. Gastrointestinal: No abdominal pain.  No nausea, no vomiting.  No diarrhea.  No constipation. Genitourinary: Negative for dysuria. Musculoskeletal: Negative for back pain. Skin: Negative for rash. Neurological: Negative for headaches, focal weakness or numbness.   ____________________________________________   PHYSICAL EXAM:  VITAL SIGNS: ED Triage Vitals  Enc Vitals Group     BP      Pulse      Resp      Temp      Temp src      SpO2      Weight      Height      Head Circumference      Peak Flow      Pain Score      Pain Loc      Pain Edu?       Excl. in GC?     Constitutional: Alert and oriented.  Chronically ill appearing and in moderate to severe acute distress.  Very talkative but hyperventilating. Eyes: Conjunctivae are normal. PERRL. EOMI. Head: Atraumatic. Nose: No congestion/rhinnorhea. Mouth/Throat: Mucous membranes are moist.  Oropharynx non-erythematous. Neck: No stridor.   Cardiovascular: Tachycardic rate, regular rhythm. Grossly normal heart sounds.  Good peripheral circulation. Respiratory: Increased respiratory effort.  Retractions. Lungs diminished bilaterally. Gastrointestinal: Soft and nontender. No distention. No abdominal bruits. No CVA tenderness. Musculoskeletal: No lower extremity tenderness nor edema.  No joint effusions. Neurologic:  Normal speech and language. No gross focal neurologic deficits are appreciated.  Skin:  Skin is warm, dry and intact. No rash noted. Psychiatric: Mood and affect are normal. Speech and behavior are normal.  ____________________________________________   LABS (all labs ordered are listed, but only abnormal results are displayed)  Labs Reviewed  LACTIC ACID, PLASMA - Abnormal; Notable for the following components:      Result Value   Lactic Acid, Venous 5.3 (*)    All other components within normal limits  CBC WITH DIFFERENTIAL/PLATELET - Abnormal; Notable for the following components:   WBC 24.4 (*)    Neutro Abs 18.8 (*)    Monocytes Absolute 1.6 (*)    Abs Immature Granulocytes 0.12 (*)    All other components within normal limits  COMPREHENSIVE METABOLIC PANEL - Abnormal; Notable for the following components:   Glucose, Bld 186 (*)    All other components within normal limits  BLOOD GAS, ARTERIAL - Abnormal; Notable for the following components:   pO2, Arterial 124 (*)    Acid-base deficit 3.0 (*)    All other components within normal limits  RESPIRATORY PANEL BY RT PCR (FLU A&B, COVID)  CULTURE, BLOOD (ROUTINE X 2)  CULTURE, BLOOD (ROUTINE X 2)  LACTIC ACID,  PLASMA  PROCALCITONIN  HIV ANTIBODY (ROUTINE TESTING W REFLEX)  PROTIME-INR  CORTISOL-AM, BLOOD  CBC  HEMOGLOBIN A1C  POC SARS CORONAVIRUS 2 AG -  ED  POC SARS CORONAVIRUS 2 AG  TROPONIN I (HIGH SENSITIVITY)  TROPONIN I (HIGH SENSITIVITY)   ____________________________________________  EKG  ED ECG REPORT I, Emerson Schreifels J, the attending physician, personally viewed and interpreted this ECG.   Date: 07/22/2019  EKG Time: 0324  Rate: 119  Rhythm: sinus tachycardia  Axis: Normal  Intervals:none  ST&T Change: Nonspecific  ____________________________________________  RADIOLOGY  ED MD interpretation: Increased ground glass opacities suspicious for infection  Official radiology report(s): DG Chest Port 1 View  Result Date: 07/22/2019 CLINICAL DATA:  Cough, worsening shortness of breath EXAM: PORTABLE CHEST 1 VIEW COMPARISON:  07/07/2019 FINDINGS: Single frontal view of the chest demonstrates a stable cardiac silhouette. Progressive interstitial and ground-glass opacities throughout the lungs, greatest at the bases. Findings are consistent with edema or infection superimposed upon background scarring and fibrosis. No effusion or pneumothorax. No acute bony abnormalities. IMPRESSION: 1. Increased ground-glass opacities primarily at the lung bases, compatible with edema or infection superimposed upon background scarring and fibrosis. Electronically Signed   By: Sharlet Salina M.D.   On: 07/22/2019 03:50    ____________________________________________   PROCEDURES  Procedure(s) performed (including Critical Care):  Procedures  CRITICAL CARE Performed by: Irean Hong   Total critical care time: 45 minutes  Critical care time was exclusive of separately billable procedures and treating other patients.  Critical care was necessary to treat or prevent imminent or life-threatening deterioration.  Critical care was time spent personally by me on the following activities:  development of treatment plan with patient and/or surrogate as well as nursing, discussions with consultants, evaluation of patient's response to treatment, examination of patient, obtaining history from patient or surrogate, ordering and performing treatments and interventions, ordering and review of laboratory studies, ordering and review of radiographic studies, pulse oximetry and re-evaluation of patient's condition.  ____________________________________________   INITIAL IMPRESSION / ASSESSMENT AND PLAN / ED COURSE  As part of my medical decision making, I reviewed the following data within the electronic MEDICAL RECORD NUMBER Nursing notes reviewed and incorporated, Labs reviewed, EKG interpreted, Old chart reviewed, Radiograph reviewed, Discussed with admitting physician and Notes from prior ED visits     Zakery Normington. was evaluated in Emergency Department on 07/22/2019 for the symptoms described in the history of present illness. He was evaluated in the context of the global COVID-19 pandemic, which necessitated consideration that the patient might be at risk for infection with the SARS-CoV-2 virus that causes COVID-19. Institutional protocols and algorithms that pertain to the evaluation of patients at risk for COVID-19 are in a state of rapid change based on information released by regulatory bodies including the CDC and federal and state organizations. These policies and algorithms were followed during the patient's care in the ED.    74 year old male with COPD, IPF noncompliant with oxygen and noncompliance with antifibrotic medication who presents with respiratory distress. Differential includes, but is not limited to, viral syndrome, bronchitis including COPD exacerbation, pneumonia, reactive airway disease including asthma, CHF including exacerbation with or without pulmonary/interstitial edema, pneumothorax, ACS, thoracic trauma, and pulmonary embolism.  Will obtain blood cultures,  basic lab work, lactic acid, chest x-ray.  Patient placed on BiPAP with his arrival to the treatment room.  Anticipate hospitalization.  Clinical Course as of Jul 21 698  Tue Jul 22, 2019  0341 Rapid Covid antigen is NEGATIVE.  Patient appears more comfortable on BiPAP.  Rectal temperature 103.7 F.  Code Sepsis initiated.   [JS]  [id number] Patient desires to leave.  States his wife is scheduled for surgery in 2 days and  he needs to be at home with her.  We discussed the very likely risk of him having respiratory failure requiring intubation and even death if he goes home.  Patient is very adamant about leaving and is threatening to remove his IV in order to go home.  He verbalizes the very real risk to his health as well as potentially to his wife's health.  Patient will be leaving AGAINST MEDICAL ADVICE.  Patient does not exhibit altered mentation and has the capacity to make his own medical decisions.  Will prescribe Augmentin and Azithromycin and encouraged very close follow-up with his pulmonologist.  Strict return precautions given.  Patient verbalizes understanding and agrees with plan of care.   [JS]    Clinical Course User Index [JS] Paulette Blanch, MD     ____________________________________________   FINAL CLINICAL IMPRESSION(S) / ED DIAGNOSES  Final diagnoses:  Respiratory distress  COPD exacerbation ([place])  IPF (idiopathic pulmonary fibrosis) ([place])  Hypoxia  Community acquired pneumonia, unspecified laterality     ED Discharge Orders         Ordered    amoxicillin-clavulanate (AUGMENTIN) 875-125 MG tablet  2 times daily     07/22/19 0542    azithromycin (ZITHROMAX) 250 MG tablet  Daily     07/22/19 0542           Note:  This document was prepared using Dragon voice recognition software and may include unintentional dictation errors.   Paulette Blanch, MD 07/22/19 [phone]

## 2019-07-22 NOTE — ED Triage Notes (Signed)
Pt presents to ED with worsening SOB from home. Pt states he used his nebulizer tx at home with no relief. Pt coughing frequently during triage. Pt uses home O2 set at 3 L. Pt very anxious.

## 2019-07-22 NOTE — ED Notes (Signed)
Spoke with pt wife who told this nurse if the pt did not come home she would have him arrested for not being with her. She reported that it was illegal to leave her alone and he would get in trouble. Pt states he is leaving and signed AMA papers due to signature pad malfunctioning.

## 2019-07-22 NOTE — Progress Notes (Signed)
CODE SEPSIS - PHARMACY COMMUNICATION  **Broad Spectrum Antibiotics should be administered within 1 hour of Sepsis diagnosis**  Time Code Sepsis Called/Page Received: 8295  Antibiotics Ordered: azithromycin/ceftriaxone  Time of 1st antibiotic administration: 0349  Additional action taken by pharmacy:   If necessary, Name of Provider/Nurse Contacted:     Thomasene Ripple ,PharmD Clinical Pharmacist  07/22/2019  4:38 AM

## 2019-07-22 NOTE — ED Notes (Signed)
Pt removed from bi-pap by RT per his request. Dr. Dolores Frame at the bedside.

## 2019-07-22 NOTE — ED Notes (Signed)
Resp. Therapist at the bedside.

## 2019-07-24 ENCOUNTER — Other Ambulatory Visit: Payer: Self-pay

## 2019-07-24 ENCOUNTER — Encounter: Payer: Self-pay | Admitting: Emergency Medicine

## 2019-07-24 ENCOUNTER — Emergency Department
Admission: EM | Admit: 2019-07-24 | Discharge: 2019-07-25 | Disposition: A | Discharge status: Home / Self Care | Payer: Medicare Other | Attending: Emergency Medicine | Admitting: Emergency Medicine

## 2019-07-24 ENCOUNTER — Other Ambulatory Visit: Payer: Self-pay | Admitting: Otolaryngology

## 2019-07-24 DIAGNOSIS — R1310 Dysphagia, unspecified: Secondary | ICD-10-CM

## 2019-07-24 DIAGNOSIS — Z87891 Personal history of nicotine dependence: Secondary | ICD-10-CM | POA: Diagnosis not present

## 2019-07-24 DIAGNOSIS — Z7982 Long term (current) use of aspirin: Secondary | ICD-10-CM | POA: Diagnosis not present

## 2019-07-24 DIAGNOSIS — I110 Hypertensive heart disease with heart failure: Secondary | ICD-10-CM | POA: Insufficient documentation

## 2019-07-24 DIAGNOSIS — J449 Chronic obstructive pulmonary disease, unspecified: Secondary | ICD-10-CM | POA: Diagnosis not present

## 2019-07-24 DIAGNOSIS — T50905A Adverse effect of unspecified drugs, medicaments and biological substances, initial encounter: Secondary | ICD-10-CM

## 2019-07-24 DIAGNOSIS — I509 Heart failure, unspecified: Secondary | ICD-10-CM | POA: Insufficient documentation

## 2019-07-24 DIAGNOSIS — Z79899 Other long term (current) drug therapy: Secondary | ICD-10-CM | POA: Insufficient documentation

## 2019-07-24 DIAGNOSIS — R109 Unspecified abdominal pain: Secondary | ICD-10-CM | POA: Insufficient documentation

## 2019-07-24 DIAGNOSIS — E119 Type 2 diabetes mellitus without complications: Secondary | ICD-10-CM | POA: Insufficient documentation

## 2019-07-24 DIAGNOSIS — Z7984 Long term (current) use of oral hypoglycemic drugs: Secondary | ICD-10-CM | POA: Insufficient documentation

## 2019-07-24 DIAGNOSIS — I2510 Atherosclerotic heart disease of native coronary artery without angina pectoris: Secondary | ICD-10-CM | POA: Insufficient documentation

## 2019-07-24 LAB — CBC WITH DIFFERENTIAL/PLATELET
Abs Immature Granulocytes: 0.06 10*3/uL (ref 0.00–0.07)
Basophils Absolute: 0.1 10*3/uL (ref 0.0–0.1)
Basophils Relative: 0 %
Eosinophils Absolute: 0.2 10*3/uL (ref 0.0–0.5)
Eosinophils Relative: 2 %
HCT: 38.8 % — ABNORMAL LOW (ref 39.0–52.0)
Hemoglobin: 12.6 g/dL — ABNORMAL LOW (ref 13.0–17.0)
Immature Granulocytes: 1 %
Lymphocytes Relative: 19 %
Lymphs Abs: 2.4 10*3/uL (ref 0.7–4.0)
MCH: 27.7 pg (ref 26.0–34.0)
MCHC: 32.5 g/dL (ref 30.0–36.0)
MCV: 85.3 fL (ref 80.0–100.0)
Monocytes Absolute: 1.1 10*3/uL — ABNORMAL HIGH (ref 0.1–1.0)
Monocytes Relative: 8 %
Neutro Abs: 9 10*3/uL — ABNORMAL HIGH (ref 1.7–7.7)
Neutrophils Relative %: 70 %
Platelets: 254 10*3/uL (ref 150–400)
RBC: 4.55 MIL/uL (ref 4.22–5.81)
RDW: 13.3 % (ref 11.5–15.5)
WBC: 12.8 10*3/uL — ABNORMAL HIGH (ref 4.0–10.5)
nRBC: 0 % (ref 0.0–0.2)

## 2019-07-24 LAB — COMPREHENSIVE METABOLIC PANEL
ALT: 54 U/L — ABNORMAL HIGH (ref 0–44)
AST: 114 U/L — ABNORMAL HIGH (ref 15–41)
Albumin: 3.8 g/dL (ref 3.5–5.0)
Alkaline Phosphatase: 68 U/L (ref 38–126)
Anion gap: 8 (ref 5–15)
BUN: 14 mg/dL (ref 8–23)
CO2: 27 mmol/L (ref 22–32)
Calcium: 8.9 mg/dL (ref 8.9–10.3)
Chloride: 106 mmol/L (ref 98–111)
Creatinine, Ser: 0.95 mg/dL (ref 0.61–1.24)
GFR calc Af Amer: >60 mL/min (ref >60)
GFR calc non Af Amer: >60 mL/min (ref >60)
Glucose, Bld: 158 mg/dL — ABNORMAL HIGH (ref 70–99)
Potassium: 3.8 mmol/L (ref 3.5–5.1)
Sodium: 141 mmol/L (ref 135–145)
Total Bilirubin: 1.1 mg/dL (ref 0.3–1.2)
Total Protein: 7.3 g/dL (ref 6.5–8.1)

## 2019-07-24 LAB — LIPASE, BLOOD: Lipase: 27 U/L (ref 11–51)

## 2019-07-24 LAB — TROPONIN I (HIGH SENSITIVITY): Troponin I (High Sensitivity): 5 ng/L (ref <18)

## 2019-07-24 NOTE — ED Triage Notes (Signed)
Pt to triage via w/c with no distress noted, mask in place; pt reports rx Ofev--took 1st ds this am and 2nd ds this pm; after 2nd ds began having pain to mid epigastric region with no accomp symptoms; took tums and NTG PTA with no relief

## 2019-07-25 ENCOUNTER — Emergency Department: Payer: Medicare Other

## 2019-07-25 NOTE — ED Provider Notes (Signed)
[facility] Emergency Department Provider Note  ____________________________________________   First MD Initiated Contact with Patient 07/24/19 2358     (approximate)  I have reviewed the triage vital signs and the nursing notes.   HISTORY  Chief Complaint Abdominal Pain    HPI Shane Reid. is a 74 y.o. male with below list of previous medical conditions presents to the emergency department secondary to abdominal cramping  after taking his second dose of Ofev this evening at 7 PM.  Patient states that symptoms have since completely resolved.  Patient denied any nausea no vomiting.  Patient denies any fever.        Past Medical History:  Diagnosis Date  . Anxiety   . Asthma   . Bradycardia   . CHF (congestive heart failure) (HCC)   . Chronic Chest Pain   . COPD (chronic obstructive pulmonary disease) (HCC)   . Coronary artery disease    a. 2008 s/p PCI to LAD;  b. 2013 Cath: patent stent->Med Rx;  c. 11/2013 MV: EF 67%, no ischemia/infarct.  . Depression   . Diabetes mellitus without complication (HCC)   . GERD (gastroesophageal reflux disease)   . Heart murmur    a. 02/2010 Echo: EF 55-60%, no rwma, Gr 2 DD, triv MR, mildly dil LA, nl RV.  [name] Hypercholesteremia   . Hypertension   . Hypertensive heart disease   . Idiopathic pulmonary fibrosis (HCC)    a. Seen by Dr. Dema Severin 01/2015 and Rx Esbriet - pt cannot afford.  . Other social stressor    a. wife with mental illness    Patient Active Problem List   Diagnosis Date Noted  . Acute on chronic respiratory failure with hypoxia (HCC) 07/22/2019  . Sepsis (HCC) 07/22/2019  . Community acquired pneumonia 07/22/2019  . COPD with acute exacerbation (HCC) 07/22/2019  . Type 2 diabetes mellitus without complication (HCC) 07/22/2019  . Hypertension 04/09/2018  . Pain in limb 04/09/2018  . 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 Benign localized hyperplasia of prostate with urinary obstruction  03/01/2017  . Incomplete emptying of bladder 03/01/2017  . Scrotal pain 09/11/2016  . Oropharyngeal dysphagia 09/21/2015  . Hypertensive heart disease   . Chronic Chest Pain   . Ischemic heart disease   . Hypercholesteremia   . 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 IPF (idiopathic pulmonary fibrosis) (HCC) 12/23/2014  . Pre-operative cardiovascular examination 11/05/2014  . ILD (interstitial lung disease) (HCC) 09/04/2014  . Interstitial lung disease (HCC) 06/22/2014  . Upper respiratory infection 12/03/2013  . 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 CAROTID BRUIT, LEFT 05/12/2010  . Hyperlipidemia 07/01/2009  . CAD, NATIVE VESSEL 07/01/2009  . SHORTNESS OF BREATH 07/01/2009  . Atypical chest pain 07/01/2009  . Family history of early CAD 01/23/2007  . Anxiety and depression 02/02/2005    Past Surgical History:  Procedure Laterality Date  . CARDIAC CATHETERIZATION    . CORONARY ANGIOPLASTY     stents times 4  . Esophagus stretch    . EYE SURGERY     Bilateral cataracts  . LUNG BIOPSY    . NASAL ENDOSCOPY N/A 12/21/2016   Procedure: NASAL ENDOSCOPY;  Surgeon: Vernie Murders, MD;  Location[facility];  Service: ENT;  Laterality: N/A;  . TESTICLE SURGERY    . VIDEO ASSISTED THORACOSCOPY (VATS)/THOROCOTOMY Right 11/18/2014   Procedure: VIDEO ASSISTED THORACOSCOPY (VATS)/ wedge resection biopsy;  Surgeon: Hulda Marin, MD;  Location: [facility];  Service:  General;  Laterality: Right;    Prior to Admission medications   Medication Sig Start Date End Date Taking? Authorizing Provider  acyclovir (ZOVIRAX) 800 MG tablet Take 800 mg by mouth 5 (five) times daily. For 7 days 01/23/19   [provider]  albuterol (PROVENTIL) (2.5 MG/3ML) 0.083% nebulizer solution Take 3 mLs (2.5 mg total) by nebulization every 6 (six) hours as needed. 05/02/12   Minna Merritts, MD  albuterol (VENTOLIN HFA) 108 (90 Base) MCG/ACT inhaler Inhale 2 puffs  into the lungs every 4 (four) hours as needed for wheezing. 01/22/06   [provider]  amLODipine-benazepril (LOTREL) 5-10 MG capsule TAKE 1 CAPSULE BY MOUTH EVERY DAY 05/15/19   Minna Merritts, MD  amoxicillin-clavulanate (AUGMENTIN) 875-125 MG tablet Take 1 tablet by mouth 2 (two) times daily. 07/22/19   Paulette Blanch, MD  aspirin EC 81 MG tablet Take 81 mg by mouth every morning. 08/06/12   [provider]  azithromycin (ZITHROMAX) 250 MG tablet Take 1 tablet (250 mg total) by mouth daily. 07/22/19   Paulette Blanch, MD  ESBRIET 267 MG TABS Take 267 mg by mouth 3 (three) times daily. 02/18/19   [provider]  glipiZIDE (GLUCOTROL XL) 10 MG 24 hr tablet Take 10 mg by mouth 2 (two) times daily. 04/11/19   [provider]  lidocaine (XYLOCAINE) 5 % ointment Apply 1 application topically as needed. 02/10/19   [name], Charline Bills, PA-C  metFORMIN (GLUCOPHAGE) 500 MG tablet Take 1 tablet (500 mg total) by mouth 2 (two) times daily with a meal. Patient taking differently: Take 1,000 mg by mouth 2 (two) times daily with a meal.  04/09/19 04/08/20  Earleen Newport, MD  Multiple Vitamins-Minerals (CENTRUM SILVER PO) Take by mouth daily.    [provider]  Nintedanib (OFEV) 150 MG CAPS Take 150 mg by mouth 2 (two) times daily. 07/15/19   [provider]  nitroGLYCERIN (NITROSTAT) 0.4 MG SL tablet Place 1 tablet (0.4 mg total) under the tongue every 5 (five) minutes as needed. 07/30/17   Minna Merritts, MD  nystatin (MYCOSTATIN/NYSTOP) powder Apply 1 application topically 4 (four) times daily. 01/10/19   [provider]  pantoprazole (PROTONIX) 40 MG tablet Take 40 mg by mouth daily. 12/19/18   [provider]  pregabalin (LYRICA) 75 MG capsule Take 75 mg by mouth 3 (three) times daily. 05/01/19   [provider]  rosuvastatin (CRESTOR) 10 MG tablet Take 1 tablet (10 mg total) by mouth daily. 09/05/18   Minna Merritts, MD    triamcinolone cream (KENALOG) 0.1 % Apply 1 application topically 4 (four) times daily. Patient not taking: Reported on 07/22/2019 02/10/19   [name], Charline Bills, PA-C    Allergies Metoprolol succinate, Niacin, and Sertraline hcl  Family History  Problem Relation Age of Onset  . Coronary artery disease Mother        s/p CABG  . Heart failure Mother   . Heart disease Other   . Depression Other     Social History Social History   Tobacco Use  . Smoking status: Former Smoker    Packs/day: 0.50    Years: 1.00    Pack years: 0.50    Types: Cigarettes    Quit date: 05/09/1968    Years since quitting: 51.2  . Smokeless tobacco: Never Used  Substance Use Topics  . Alcohol use: No  . Drug use: No    Review of Systems Constitutional: No fever/chills  Eyes: No visual changes. ENT: No sore throat. Cardiovascular: Denies chest pain. Respiratory: Denies shortness of breath. Gastrointestinal: Positive for abdominal pain.  No nausea, no vomiting.  No diarrhea.  No constipation. Genitourinary: Negative for dysuria. Musculoskeletal: Negative for neck pain.  Negative for back pain. Integumentary: Negative for rash. Neurological: Negative for headaches, focal weakness or numbness.   ____________________________________________   PHYSICAL EXAM:  VITAL SIGNS: ED Triage Vitals  Enc Vitals Group     BP 07/24/19 2234 140/77     Pulse Rate 07/24/19 2234 76     Resp 07/24/19 2234 20     Temp 07/24/19 2234 98.4 F (36.9 C)     Temp Source 07/24/19 2234 Oral     SpO2 07/24/19 2234 100 %     Weight 07/24/19 2229 76.7 kg (169 lb 1.5 oz)     Height 07/24/19 2229 1.753 m (5\' 9" )     Head Circumference --      Peak Flow --      Pain Score 07/24/19 2228 7     Pain Loc --      Pain Edu? --      Excl. in GC? --     Constitutional: Alert and oriented.  Eyes: Conjunctivae are normal.  Mouth/Throat: Patient is wearing a mask. Neck: No stridor.  No meningeal signs.    Cardiovascular: Normal rate, regular rhythm. Good peripheral circulation. Grossly normal heart sounds. Respiratory: Normal respiratory effort.  No retractions. Gastrointestinal: Soft and nontender. No distention.  Musculoskeletal: No lower extremity tenderness nor edema. No gross deformities of extremities. Neurologic:  Normal speech and language. No gross focal neurologic deficits are appreciated.  Skin:  Skin is warm, dry and intact. Psychiatric: Mood and affect are normal. Speech and behavior are normal.  ____________________________________________   LABS (all labs ordered are listed, but only abnormal results are displayed)  Labs Reviewed  CBC WITH DIFFERENTIAL/PLATELET - Abnormal; Notable for the following components:      Result Value   WBC 12.8 (*)    Hemoglobin 12.6 (*)    HCT 38.8 (*)    Neutro Abs 9.0 (*)    Monocytes Absolute 1.1 (*)    All other components within normal limits  COMPREHENSIVE METABOLIC PANEL - Abnormal; Notable for the following components:   Glucose, Bld 158 (*)    AST 114 (*)    ALT 54 (*)    All other components within normal limits  LIPASE, BLOOD  TROPONIN I (HIGH SENSITIVITY)   ____________________________________________  EKG  ED ECG REPORT I, Anthony N Calista Crain, the attending physician, personally viewed and interpreted this ECG.   Date: 07/24/2019  EKG Time: 10:35 PM  Rate: 77  Rhythm: Normal sinus rhythm  Axis: Normal  Intervals: Normal  ST&T Change: None  ____________________________________________  RADIOLOGY I, [place] Kazmir Oki, personally viewed and evaluated these images (plain radiographs) as part of my medical decision making, as well as reviewing the written report by the radiologist.  ED MD interpretation: Negative abdominal x-ray per radiologist  Official radiology report(s): DG Abdomen 1 View  Result Date: 07/25/2019 CLINICAL DATA:  Abdominal pain began after taking Ofev dose EXAM: ABDOMEN - 1 VIEW COMPARISON:  CT  abdomen pelvis December 29, 2015 FINDINGS: No evidence of high-grade obstruction. Multiple air-filled loops of bowel without frank distention. Mild distal colonic stool burden. No suspicious calcifications along the course of the urinary tract or overlying the gallbladder fossa. Fibrotic changes in the lung bases compatible with history of pulmonary fibrosis.  Degenerative changes in the spine hips and pelvis. IMPRESSION: No evidence of high-grade obstruction. Mild distal colonic stool burden. Fibrotic changes in the lung bases compatible with history of pulmonary fibrosis. Electronically Signed   By: Kreg Shropshire M.D.   On: 07/25/2019 00:56     Procedures   ____________________________________________   INITIAL IMPRESSION / MDM / ASSESSMENT AND PLAN / ED COURSE  As part of my medical decision making, I reviewed the following data within the electronic MEDICAL RECORD NUMBER  74 year old male presented with above-stated history and physical exam secondary to abdominal pain.  Patient is pain-free at present.  Although I considered the possibility of other potential intra-abdominal pathology as the etiology for the patient's pain.  The patient's history is consistent with a side effect of Ofev.  I advised the patient to follow-up with Dr. Meredeth Ide his pulmonologist who prescribed Ofev tomorrow.  Patient agreed with plan.  Patient requested something to eat while in the emergency department did so without any difficulty.  ____________________________________________  FINAL CLINICAL IMPRESSION(S) / ED DIAGNOSES  Final diagnoses:  Medication side effect, initial encounter     MEDICATIONS GIVEN DURING THIS VISIT:  Medications - No data to display   ED Discharge Orders    None      *Please note:  Tyshun Tuckerman. was evaluated in Emergency Department on 07/25/2019 for the symptoms described in the history of present illness. He was evaluated in the context of the global COVID-19 pandemic, which  necessitated consideration that the patient might be at risk for infection with the SARS-CoV-2 virus that causes COVID-19. Institutional protocols and algorithms that pertain to the evaluation of patients at risk for COVID-19 are in a state of rapid change based on information released by regulatory bodies including the CDC and federal and state organizations. These policies and algorithms were followed during the patient's care in the ED.  Some ED evaluations and interventions may be delayed as a result of limited staffing during the pandemic.*  Note:  This document was prepared using Dragon voice recognition software and may include unintentional dictation errors.   Darci Current, MD 07/25/19 [phone]

## 2019-07-27 LAB — CULTURE, BLOOD (ROUTINE X 2)
Culture: NO GROWTH
Culture: NO GROWTH
Special Requests: ADEQUATE
Special Requests: ADEQUATE

## 2019-07-29 ENCOUNTER — Ambulatory Visit
Admission: RE | Admit: 2019-07-29 | Discharge: 2019-07-29 | Disposition: A | Discharge status: Home / Self Care | Payer: Medicare Other | Source: Ambulatory Visit | Attending: Otolaryngology | Admitting: Otolaryngology

## 2019-07-29 ENCOUNTER — Other Ambulatory Visit: Payer: Self-pay | Admitting: Otolaryngology

## 2019-07-29 ENCOUNTER — Other Ambulatory Visit: Payer: Self-pay

## 2019-07-29 DIAGNOSIS — R1310 Dysphagia, unspecified: Secondary | ICD-10-CM | POA: Diagnosis not present

## 2019-08-03 ENCOUNTER — Other Ambulatory Visit: Payer: Self-pay

## 2019-08-03 ENCOUNTER — Emergency Department
Admission: EM | Admit: 2019-08-03 | Discharge: 2019-08-03 | Disposition: A | Discharge status: Home / Self Care | Payer: Medicare Other | Attending: Emergency Medicine | Admitting: Emergency Medicine

## 2019-08-03 ENCOUNTER — Emergency Department: Payer: Medicare Other

## 2019-08-03 DIAGNOSIS — Z7984 Long term (current) use of oral hypoglycemic drugs: Secondary | ICD-10-CM | POA: Insufficient documentation

## 2019-08-03 DIAGNOSIS — R0602 Shortness of breath: Secondary | ICD-10-CM | POA: Insufficient documentation

## 2019-08-03 DIAGNOSIS — Z7982 Long term (current) use of aspirin: Secondary | ICD-10-CM | POA: Insufficient documentation

## 2019-08-03 DIAGNOSIS — E119 Type 2 diabetes mellitus without complications: Secondary | ICD-10-CM | POA: Diagnosis not present

## 2019-08-03 DIAGNOSIS — J449 Chronic obstructive pulmonary disease, unspecified: Secondary | ICD-10-CM | POA: Insufficient documentation

## 2019-08-03 DIAGNOSIS — I2510 Atherosclerotic heart disease of native coronary artery without angina pectoris: Secondary | ICD-10-CM | POA: Diagnosis not present

## 2019-08-03 DIAGNOSIS — Z79899 Other long term (current) drug therapy: Secondary | ICD-10-CM | POA: Insufficient documentation

## 2019-08-03 DIAGNOSIS — Z955 Presence of coronary angioplasty implant and graft: Secondary | ICD-10-CM | POA: Diagnosis not present

## 2019-08-03 DIAGNOSIS — I10 Essential (primary) hypertension: Secondary | ICD-10-CM | POA: Diagnosis not present

## 2019-08-03 DIAGNOSIS — Z87891 Personal history of nicotine dependence: Secondary | ICD-10-CM | POA: Insufficient documentation

## 2019-08-03 LAB — CBC WITH DIFFERENTIAL/PLATELET
Abs Immature Granulocytes: 0.04 10*3/uL (ref 0.00–0.07)
Basophils Absolute: 0.1 10*3/uL (ref 0.0–0.1)
Basophils Relative: 1 %
Eosinophils Absolute: 0.2 10*3/uL (ref 0.0–0.5)
Eosinophils Relative: 1 %
HCT: 40.3 % (ref 39.0–52.0)
Hemoglobin: 13.1 g/dL (ref 13.0–17.0)
Immature Granulocytes: 0 %
Lymphocytes Relative: 18 %
Lymphs Abs: 2.3 10*3/uL (ref 0.7–4.0)
MCH: 27.6 pg (ref 26.0–34.0)
MCHC: 32.5 g/dL (ref 30.0–36.0)
MCV: 85 fL (ref 80.0–100.0)
Monocytes Absolute: 0.7 10*3/uL (ref 0.1–1.0)
Monocytes Relative: 6 %
Neutro Abs: 9.5 10*3/uL — ABNORMAL HIGH (ref 1.7–7.7)
Neutrophils Relative %: 74 %
Platelets: 323 10*3/uL (ref 150–400)
RBC: 4.74 MIL/uL (ref 4.22–5.81)
RDW: 13.4 % (ref 11.5–15.5)
WBC: 12.9 10*3/uL — ABNORMAL HIGH (ref 4.0–10.5)
nRBC: 0 % (ref 0.0–0.2)

## 2019-08-03 LAB — BASIC METABOLIC PANEL
Anion gap: 9 (ref 5–15)
BUN: 12 mg/dL (ref 8–23)
CO2: 24 mmol/L (ref 22–32)
Calcium: 9.2 mg/dL (ref 8.9–10.3)
Chloride: 105 mmol/L (ref 98–111)
Creatinine, Ser: 0.9 mg/dL (ref 0.61–1.24)
GFR calc Af Amer: >60 mL/min (ref >60)
GFR calc non Af Amer: >60 mL/min (ref >60)
Glucose, Bld: 120 mg/dL — ABNORMAL HIGH (ref 70–99)
Potassium: 3.9 mmol/L (ref 3.5–5.1)
Sodium: 138 mmol/L (ref 135–145)

## 2019-08-03 LAB — PROCALCITONIN: Procalcitonin: 0.13 ng/mL

## 2019-08-03 NOTE — ED Provider Notes (Signed)
[facility] Emergency Department Provider Note  ____________________________________________   I have reviewed the triage vital signs and the nursing notes.   HISTORY  Chief Complaint Shortness of Breath   History limited by: poor historian   HPI Shane Reid. is a 74 y.o. male who presents to the emergency department today for somewhat unclear reason. The patient says that he is here for shortness of breath however he has had this shortness of breath for two years. He denies any worsening of the shortness of breath today. Denies any fever or chest pain. It sounds like perhaps it was more families concern that led him to come to the ER today although he is not sure what they were concerned about. The patient does have history of lung disease and is on oxygen at baseline. States he has an appointment with his pulmonologist tomorrow.   Records reviewed. Per medical record review patient has a history of COPD, CAD, pulmonary fibrosis.   Past Medical History:  Diagnosis Date  . Anxiety   . Asthma   . Bradycardia   . CHF (congestive heart failure) (HCC)   . Chronic Chest Pain   . COPD (chronic obstructive pulmonary disease) (HCC)   . Coronary artery disease    a. 2008 s/p PCI to LAD;  b. 2013 Cath: patent stent->Med Rx;  c. 11/2013 MV: EF 67%, no ischemia/infarct.  . Depression   . Diabetes mellitus without complication (HCC)   . GERD (gastroesophageal reflux disease)   . Heart murmur    a. 02/2010 Echo: EF 55-60%, no rwma, Gr 2 DD, triv MR, mildly dil LA, nl RV.  [name] Hypercholesteremia   . Hypertension   . Hypertensive heart disease   . Idiopathic pulmonary fibrosis (HCC)    a. Seen by Dr. Dema Severin 01/2015 and Rx Esbriet - pt cannot afford.  . Other social stressor    a. wife with mental illness    Patient Active Problem List   Diagnosis Date Noted  . Acute on chronic respiratory failure with hypoxia (HCC) 07/22/2019  . Sepsis (HCC) 07/22/2019  .  Community acquired pneumonia 07/22/2019  . COPD with acute exacerbation (HCC) 07/22/2019  . Type 2 diabetes mellitus without complication (HCC) 07/22/2019  . Hypertension 04/09/2018  . Pain in limb 04/09/2018  . 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 Benign localized hyperplasia of prostate with urinary obstruction 03/01/2017  . Incomplete emptying of bladder 03/01/2017  . Scrotal pain 09/11/2016  . Oropharyngeal dysphagia 09/21/2015  . Hypertensive heart disease   . Chronic Chest Pain   . Ischemic heart disease   . Hypercholesteremia   . 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 IPF (idiopathic pulmonary fibrosis) (HCC) 12/23/2014  . Pre-operative cardiovascular examination 11/05/2014  . ILD (interstitial lung disease) (HCC) 09/04/2014  . Interstitial lung disease (HCC) 06/22/2014  . Upper respiratory infection 12/03/2013  . 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 CAROTID BRUIT, LEFT 05/12/2010  . Hyperlipidemia 07/01/2009  . CAD, NATIVE VESSEL 07/01/2009  . SHORTNESS OF BREATH 07/01/2009  . Atypical chest pain 07/01/2009  . Family history of early CAD 01/23/2007  . Anxiety and depression 02/02/2005    Past Surgical History:  Procedure Laterality Date  . CARDIAC CATHETERIZATION    . CORONARY ANGIOPLASTY     stents times 4  . Esophagus stretch    . EYE SURGERY     Bilateral cataracts  . LUNG BIOPSY    . NASAL ENDOSCOPY N/A 12/21/2016  Procedure: NASAL ENDOSCOPY;  Surgeon: Vernie Murders, MD;  Location: [facility];  Service: ENT;  Laterality: N/A;  . TESTICLE SURGERY    . VIDEO ASSISTED THORACOSCOPY (VATS)/THOROCOTOMY Right 11/18/2014   Procedure: VIDEO ASSISTED THORACOSCOPY (VATS)/ wedge resection biopsy;  Surgeon: Hulda Marin, MD;  Location: [facility];  Service: General;  Laterality: Right;    Prior to Admission medications   Medication Sig Start Date End Date Taking? Authorizing Provider  acyclovir (ZOVIRAX) 800 MG tablet Take 800 mg by  mouth 5 (five) times daily. For 7 days 01/23/19   [provider]  albuterol (PROVENTIL) (2.5 MG/3ML) 0.083% nebulizer solution Take 3 mLs (2.5 mg total) by nebulization every 6 (six) hours as needed. 05/02/12   Antonieta Iba, MD  albuterol (VENTOLIN HFA) 108 (90 Base) MCG/ACT inhaler Inhale 2 puffs into the lungs every 4 (four) hours as needed for wheezing. 01/22/06   [provider]  amLODipine-benazepril (LOTREL) 5-10 MG capsule TAKE 1 CAPSULE BY MOUTH EVERY DAY 05/15/19   Antonieta Iba, MD  amoxicillin-clavulanate (AUGMENTIN) 875-125 MG tablet Take 1 tablet by mouth 2 (two) times daily. 07/22/19   Irean Hong, MD  aspirin EC 81 MG tablet Take 81 mg by mouth every morning. 08/06/12   [provider]  azithromycin (ZITHROMAX) 250 MG tablet Take 1 tablet (250 mg total) by mouth daily. 07/22/19   Irean Hong, MD  ESBRIET 267 MG TABS Take 267 mg by mouth 3 (three) times daily. 02/18/19   [provider]  glipiZIDE (GLUCOTROL XL) 10 MG 24 hr tablet Take 10 mg by mouth 2 (two) times daily. 04/11/19   [provider]  lidocaine (XYLOCAINE) 5 % ointment Apply 1 application topically as needed. 02/10/19   [name], Delorise Royals, PA-C  metFORMIN (GLUCOPHAGE) 500 MG tablet Take 1 tablet (500 mg total) by mouth 2 (two) times daily with a meal. Patient taking differently: Take 1,000 mg by mouth 2 (two) times daily with a meal.  04/09/19 04/08/20  Emily Filbert, MD  Multiple Vitamins-Minerals (CENTRUM SILVER PO) Take by mouth daily.    [provider]  Nintedanib (OFEV) 150 MG CAPS Take 150 mg by mouth 2 (two) times daily. 07/15/19   [provider]  nitroGLYCERIN (NITROSTAT) 0.4 MG SL tablet Place 1 tablet (0.4 mg total) under the tongue every 5 (five) minutes as needed. 07/30/17   Antonieta Iba, MD  nystatin (MYCOSTATIN/NYSTOP) powder Apply 1 application topically 4 (four) times daily. 01/10/19   [provider]  pantoprazole  (PROTONIX) 40 MG tablet Take 40 mg by mouth daily. 12/19/18   [provider]  pregabalin (LYRICA) 75 MG capsule Take 75 mg by mouth 3 (three) times daily. 05/01/19   [provider]  rosuvastatin (CRESTOR) 10 MG tablet Take 1 tablet (10 mg total) by mouth daily. 09/05/18   Antonieta Iba, MD  triamcinolone cream (KENALOG) 0.1 % Apply 1 application topically 4 (four) times daily. Patient not taking: Reported on 07/22/2019 02/10/19   [name], Delorise Royals, PA-C    Allergies Ofev [nintedanib], Metoprolol succinate, Niacin, and Sertraline hcl  Family History  Problem Relation Age of Onset  . Coronary artery disease Mother        s/p CABG  . Heart failure Mother   . Heart disease Other   . Depression Other     Social History Social History   Tobacco Use  . Smoking status: Former Smoker    Packs/day: 0.50  Years: 1.00    Pack years: 0.50    Types: Cigarettes    Quit date: 05/09/1968    Years since quitting: 51.2  . Smokeless tobacco: Never Used  Substance Use Topics  . Alcohol use: No  . Drug use: No    Review of Systems Constitutional: No fever/chills Eyes: No visual changes. ENT: No sore throat. Cardiovascular: Denies chest pain. Respiratory: Positive for chronic shortness of breath.  Gastrointestinal: No abdominal pain.  No nausea, no vomiting.  No diarrhea.   Genitourinary: Negative for dysuria. Musculoskeletal: Negative for back pain. Skin: Negative for rash. Neurological: Negative for headaches, focal weakness or numbness.  ____________________________________________   PHYSICAL EXAM:  VITAL SIGNS: ED Triage Vitals  Enc Vitals Group     BP 08/03/19 1901 125/89     Pulse Rate 08/03/19 1901 89     Resp 08/03/19 1901 18     Temp 08/03/19 1901 98.1 F (36.7 C)     Temp Source 08/03/19 1901 Oral     SpO2 08/03/19 1901 97 %     Weight 08/03/19 1902 169 lb 1.5 oz (76.7 kg)     Height 08/03/19 1902 5\' 9"  (1.753 m)     Head Circumference --       Peak Flow --      Pain Score 08/03/19 1901 0   Constitutional: Alert and oriented.  Eyes: Conjunctivae are normal.  ENT      Head: Normocephalic and atraumatic.      Nose: No congestion/rhinnorhea.      Mouth/Throat: Mucous membranes are moist.      Neck: No stridor. Hematological/Lymphatic/Immunilogical: No cervical lymphadenopathy. Cardiovascular: Normal rate, regular rhythm.  No murmurs, rubs, or gallops.  Respiratory: Normal respiratory effort without tachypnea nor retractions. Some rhonchi appreciated.  Gastrointestinal: Soft and non tender. No rebound. No guarding.  Genitourinary: Deferred Musculoskeletal: Normal range of motion in all extremities. No lower extremity edema. Neurologic:  Normal speech and language. No gross focal neurologic deficits are appreciated.  Skin:  Skin is warm, dry and intact. No rash noted. Psychiatric: Mood and affect are normal. Speech and behavior are normal. Patient exhibits appropriate insight and judgment.  ____________________________________________    LABS (pertinent positives/negatives)  Procalcitonin 0.13 BMP wnl except glu 120 CBC wbc 12.9, hgb 13.1, plt 323  ____________________________________________   EKG  I, Nance Pear, attending physician, personally viewed and interpreted this EKG  EKG Time: 1953 Rate: 68 Rhythm: sinus rhythm Axis: normal Intervals: qtc 402 QRS: narrow ST changes: no st elevation Impression: normal ekg   ____________________________________________    RADIOLOGY  CXR Increasing hazy opacities in right upper lung  ____________________________________________   PROCEDURES  Procedures  ____________________________________________   INITIAL IMPRESSION / ASSESSMENT AND PLAN / ED COURSE  Pertinent labs & imaging results that were available during my care of the patient were reviewed by me and considered in my medical decision making (see chart for details).   Patient presented  to the emergency department today for somewhat unclear reasons. Has history of chronic shortness of breath and denies any acute changes. CXR did show possible worsening consolidation and patient did have very mild leukocytosis. However procalcitonin 0.13 and patient afebrile. At this time since patient is not complaining of any new symptoms I do have low suspicion for pneumonia. He does have appointment tomorrow with pulmonologist. Discussed findings with patient.    ____________________________________________   FINAL CLINICAL IMPRESSION(S) / ED DIAGNOSES  Final diagnoses:  SOB (shortness of breath)  Note: This dictation was prepared with Dragon dictation. Any transcriptional errors that result from this process are unintentional     Phineas Semen, MD 08/03/19 2328

## 2019-08-03 NOTE — Discharge Instructions (Addendum)
Please seek medical attention for any high fevers, chest pain, shortness of breath, change in behavior, persistent vomiting, bloody stool or any other new or concerning symptoms.  

## 2019-08-03 NOTE — ED Notes (Signed)
Pt reporting he wears 2-3 liters of oxygen at home but did not bring his oxygen with him today to ED. Pt reports having felt "fine" until he walked to chest Xray and outside to "get some fresh air." When pt returned to ED lobby he was tachypnic at 30 with oxygen saturation of 88% on RA. PT taken to treatment room and sitting in bed with oxygen saturation of 90% on RA. Pt placed on 2L of oxygen and is currently at 99%. RR of 23.

## 2019-08-03 NOTE — ED Triage Notes (Signed)
FIRST NURSE NOTE: Pt states "I'm back again" pt reports he is here because of his breathing, pt talking in complete sentences at this time, no distress noted on arrival. Pt ambulatory without assistance.

## 2019-08-03 NOTE — ED Triage Notes (Signed)
Pt to the er for his chronic sob. Pt states his mouth is dry and wants [place] to look at his meds and wants to know if a humidifier will help. Pt is no distress.

## 2019-08-03 NOTE — ED Notes (Signed)
Reviewed discharge instructions and follow-up care with patient. Patient verbalized understanding of all information reviewed. Patient stable, with no distress noted at this time.    

## 2019-08-04 ENCOUNTER — Other Ambulatory Visit: Payer: Self-pay | Admitting: Pulmonary Disease

## 2019-08-04 ENCOUNTER — Other Ambulatory Visit: Payer: Self-pay

## 2019-08-04 DIAGNOSIS — J84112 Idiopathic pulmonary fibrosis: Secondary | ICD-10-CM

## 2019-08-05 ENCOUNTER — Other Ambulatory Visit: Payer: Self-pay

## 2019-08-05 ENCOUNTER — Ambulatory Visit: Payer: Medicare Other | Attending: Pulmonary Disease

## 2019-08-05 DIAGNOSIS — J84112 Idiopathic pulmonary fibrosis: Secondary | ICD-10-CM | POA: Insufficient documentation

## 2019-08-05 LAB — BLOOD GAS, ARTERIAL
Acid-base deficit: 0.1 mmol/L (ref 0.0–2.0)
Bicarbonate: 23.9 mmol/L (ref 20.0–28.0)
FIO2: 0.21
O2 Saturation: 97.2 %
Patient temperature: 37
pCO2 arterial: 36 mmHg (ref 32.0–48.0)
pH, Arterial: 7.43 (ref 7.350–7.450)
pO2, Arterial: 90 mmHg (ref 83.0–108.0)

## 2019-08-06 ENCOUNTER — Encounter: Payer: Self-pay | Admitting: Gastroenterology

## 2019-08-07 ENCOUNTER — Encounter: Payer: Self-pay | Admitting: Gastroenterology

## 2019-08-07 ENCOUNTER — Ambulatory Visit (INDEPENDENT_AMBULATORY_CARE_PROVIDER_SITE_OTHER): Payer: Medicare Other | Admitting: Gastroenterology

## 2019-08-07 DIAGNOSIS — Z5329 Procedure and treatment not carried out because of patient's decision for other reasons: Secondary | ICD-10-CM

## 2019-08-13 ENCOUNTER — Encounter: Payer: Self-pay | Admitting: Gastroenterology

## 2019-08-14 ENCOUNTER — Ambulatory Visit (INDEPENDENT_AMBULATORY_CARE_PROVIDER_SITE_OTHER): Payer: Medicare Other | Admitting: Gastroenterology

## 2019-08-14 ENCOUNTER — Other Ambulatory Visit: Payer: Self-pay

## 2019-08-14 ENCOUNTER — Encounter: Payer: Self-pay | Admitting: Gastroenterology

## 2019-08-14 DIAGNOSIS — Z5329 Procedure and treatment not carried out because of patient's decision for other reasons: Secondary | ICD-10-CM

## 2019-08-21 ENCOUNTER — Other Ambulatory Visit: Payer: Self-pay | Admitting: Cardiovascular Disease

## 2019-08-28 ENCOUNTER — Other Ambulatory Visit: Payer: Self-pay | Admitting: Cardiovascular Disease

## 2019-08-28 MED ORDER — ROSUVASTATIN CALCIUM 10 MG PO TABS: 10.0000 mg | ORAL_TABLET | Freq: Every day | ORAL | 0 refills | Status: DC

## 2019-08-28 MED ORDER — AMLODIPINE BESY-BENAZEPRIL HCL 5-10 MG PO CAPS: ORAL_CAPSULE | ORAL | 0 refills | Status: DC

## 2019-08-28 NOTE — Telephone Encounter (Signed)
*  STAT* If patient is at the pharmacy, call can be transferred to refill team.   1. Which medications need to be refilled? (please list name of each medication and dose if known)    Amlodipine- benazepril 5-10 mg po q d  Rosuvastatin 10 mg po q d   2. Which pharmacy/location (including street and city if local pharmacy) is medication to be sent to?  [facility] [address]  3. Do they need a 30 day or 90 day supply? 90

## 2019-08-28 NOTE — Telephone Encounter (Signed)
Requested Prescriptions   Signed Prescriptions Disp Refills  . amLODipine-benazepril (LOTREL) 5-10 MG capsule 30 capsule 0    Sig: TAKE 1 CAPSULE BY MOUTH EVERY DAY    Authorizing Provider: Antonieta Iba    Ordering User: Veto Macqueen C  . rosuvastatin (CRESTOR) 10 MG tablet 30 tablet 0    Sig: Take 1 tablet (10 mg total) by mouth daily. Please call to schedule office visit for further refills. Thank you!    Authorizing Provider: Antonieta Iba    Ordering User: Kendrick Fries

## 2019-08-29 MED ORDER — ROSUVASTATIN CALCIUM 10 MG PO TABS: 10.0000 mg | ORAL_TABLET | Freq: Every day | ORAL | 0 refills | Status: DC

## 2019-08-29 MED ORDER — AMLODIPINE BESY-BENAZEPRIL HCL 5-10 MG PO CAPS: 1.0000 | ORAL_CAPSULE | Freq: Every day | ORAL | 0 refills | Status: DC

## 2019-08-29 NOTE — Telephone Encounter (Signed)
Please schedule 12 mo F/U appointment. Thanks!

## 2019-08-29 NOTE — Telephone Encounter (Signed)
Requested Prescriptions   Signed Prescriptions Disp Refills   rosuvastatin (CRESTOR) 10 MG tablet 90 tablet 0    Sig: Take 1 tablet (10 mg total) by mouth daily.    Authorizing Provider: Antonieta Iba    Ordering User: Thayer Headings, Annaya Bangert L   amLODipine-benazepril (LOTREL) 5-10 MG capsule 90 capsule 0    Sig: Take 1 capsule by mouth daily.    Authorizing Provider: Antonieta Iba    Ordering User: Thayer Headings, Rifky Lapre L   Patient scheduled to see Dr. Mariah Milling on 09/08/2019 at 8am.

## 2019-08-29 NOTE — Telephone Encounter (Signed)
Patient calling  Needs his prescription refills sent in for 90 day and not 30 day  If there are any issues please call to discuss

## 2019-08-29 NOTE — Addendum Note (Signed)
Addended by: Thayer Headings, Anjali Manzella L on: 08/29/2019 03:29 PM   Modules accepted: Orders

## 2019-09-03 ENCOUNTER — Ambulatory Visit: Payer: Medicare Other | Attending: Internal Medicine

## 2019-09-03 DIAGNOSIS — G4761 Periodic limb movement disorder: Secondary | ICD-10-CM | POA: Insufficient documentation

## 2019-09-03 DIAGNOSIS — J984 Other disorders of lung: Secondary | ICD-10-CM | POA: Insufficient documentation

## 2019-09-03 DIAGNOSIS — G4733 Obstructive sleep apnea (adult) (pediatric): Secondary | ICD-10-CM | POA: Insufficient documentation

## 2019-09-04 ENCOUNTER — Ambulatory Visit: Payer: Medicare Other | Attending: Pulmonary Disease

## 2019-09-04 ENCOUNTER — Other Ambulatory Visit: Payer: Self-pay | Admitting: Pulmonary Disease

## 2019-09-04 ENCOUNTER — Other Ambulatory Visit: Payer: Self-pay

## 2019-09-04 DIAGNOSIS — J84112 Idiopathic pulmonary fibrosis: Secondary | ICD-10-CM

## 2019-09-04 LAB — BLOOD GAS, ARTERIAL
Acid-Base Excess: 3 mmol/L — ABNORMAL HIGH (ref 0.0–2.0)
Bicarbonate: 27.1 mmol/L (ref 20.0–28.0)
O2 Saturation: 96.1 %
Patient temperature: 37
pCO2 arterial: 39 mmHg (ref 32.0–48.0)
pH, Arterial: 7.45 (ref 7.350–7.450)
pO2, Arterial: 79 mmHg — ABNORMAL LOW (ref 83.0–108.0)

## 2019-09-08 ENCOUNTER — Ambulatory Visit: Payer: Medicare Other | Admitting: Cardiovascular Disease

## 2019-09-20 ENCOUNTER — Other Ambulatory Visit: Payer: Self-pay | Admitting: Cardiovascular Disease

## 2019-10-07 ENCOUNTER — Other Ambulatory Visit: Payer: Self-pay

## 2019-10-08 ENCOUNTER — Other Ambulatory Visit: Payer: Self-pay

## 2019-10-08 ENCOUNTER — Ambulatory Visit (INDEPENDENT_AMBULATORY_CARE_PROVIDER_SITE_OTHER): Payer: Medicare Other | Admitting: Gastroenterology

## 2019-10-08 ENCOUNTER — Encounter: Payer: Self-pay | Admitting: Gastroenterology

## 2019-10-08 VITALS — BP 127/73 | HR 73 | Temp 97.3°F | Wt 156.0 lb

## 2019-10-08 DIAGNOSIS — R1310 Dysphagia, unspecified: Secondary | ICD-10-CM

## 2019-10-09 ENCOUNTER — Telehealth: Payer: Self-pay

## 2019-10-09 ENCOUNTER — Other Ambulatory Visit: Payer: Self-pay

## 2019-10-09 NOTE — Telephone Encounter (Signed)
Dr. Maximino Greenland is requesting pulmonology clearance from Dr. Jinny Sanders for patient to have an EGD on 10/22/2019. The form was faxed to his office and now we are awaiting on response.

## 2019-10-09 NOTE — Progress Notes (Signed)
Shane Reid [address]  [place]  [place], [place] [address]  Main: [phone]  Fax: [phone]   Gastroenterology Consultation  Referring Provider:     Margaretha Sheffield, MD Primary Care Physician:  Elba Barman, MD Reason for Consultation: Dysphagia        HPI:    Chief Complaint  Patient presents with  . New Patient (Initial Visit)  . Dysphagia    Patient stated that he has had swelling and throbbing pain on his esophagus. Patient also stated that he has noticed some blood when he coughs.    Shane Reid. is a 74 y.o. y/o male referred for consultation & management  by Dr. Loma Newton, Honor Loh, MD.  Patient has previously been seen by Dr. Dian Queen for his symptoms, with unrevealing findings.There were a barium swallow that showed a small sliding hiatal hernia with prominent B ring, no obstruction.  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  Reports remote history of upper endoscopy with dilation years ago at outlying facility.  We do not have the results of this.  Last colonoscopy was in 2011 at [facility].  Results not available.  Past Medical History:  Diagnosis Date  . Anxiety   . Asthma   . Bradycardia   . CHF (congestive heart failure) ([place])   . Chronic Chest Pain   . COPD (chronic obstructive pulmonary disease) ([place])   . Coronary artery disease    a. 2008 s/p PCI to LAD;  b. 2013 Cath: patent stent->Med Rx;  c. 11/2013 MV: EF 67%, no ischemia/infarct.  . Depression   . Diabetes mellitus without complication ([place])   . GERD (gastroesophageal reflux disease)   . Heart murmur    a. 02/2010 Echo: EF 55-60%, no rwma, Gr 2 DD, triv MR, mildly dil LA, nl RV.  [name] Hypercholesteremia   . Hypertension   . Hypertensive heart disease   . Idiopathic pulmonary fibrosis ([place])    a. Seen by Dr. Stevenson Clinch 01/2015 and Rx Esbriet - pt cannot afford.  . Other social stressor    a. wife with mental illness    Past Surgical History:  Procedure  Laterality Date  . CARDIAC CATHETERIZATION    . CORONARY ANGIOPLASTY     stents times 4  . Esophagus stretch    . EYE SURGERY     Bilateral cataracts  . LUNG BIOPSY    . NASAL ENDOSCOPY N/A 12/21/2016   Procedure: NASAL ENDOSCOPY;  Surgeon: Margaretha Sheffield, MD;  Location: [facility];  Service: ENT;  Laterality: N/A;  . TESTICLE SURGERY    . VIDEO ASSISTED THORACOSCOPY (VATS)/THOROCOTOMY Right 11/18/2014   Procedure: VIDEO ASSISTED THORACOSCOPY (VATS)/ wedge resection biopsy;  Surgeon: Nestor Lewandowsky, MD;  Location: [facility];  Service: General;  Laterality: Right;    Prior to Admission medications   Medication Sig Start Date End Date Taking? Authorizing Provider  Accu-Chek Softclix Lancets lancets USE AS DIRECTED EVERY DAY 04/12/19  Yes [provider]  albuterol (PROVENTIL) (2.5 MG/3ML) 0.083% nebulizer solution Take 3 mLs (2.5 mg total) by nebulization every 6 (six) hours as needed. 05/02/12  Yes Gollan, Kathlene November, MD  albuterol (VENTOLIN HFA) 108 (90 Base) MCG/ACT inhaler Inhale 2 puffs into the lungs every 4 (four) hours as needed for wheezing. 01/22/06  Yes [provider]  amLODipine-benazepril (LOTREL) 5-10 MG capsule Take 1 capsule by mouth daily. 08/29/19  Yes Minna Merritts, MD  aspirin EC 81 MG tablet Take 81 mg  by mouth every morning. 08/06/12  Yes [provider]  Blood Glucose Monitoring Suppl (ACCU-CHEK GUIDE ME) w/Device KIT DX E11.9 (MEDICAID PREFERRED) 04/11/19  Yes [provider]  capsicum (ZOSTRIX) 0.075 % topical cream Apply topically. 06/15/19 06/14/20 Yes [provider]  metFORMIN (GLUCOPHAGE) 500 MG tablet Take 1 tablet (500 mg total) by mouth 2 (two) times daily with a meal. Patient taking differently: Take 1,000 mg by mouth 2 (two) times daily with a meal.  04/09/19 04/08/20 Yes Earleen Newport, MD  Multiple Vitamins-Minerals (CENTRUM SILVER PO) Take by mouth daily.   Yes [provider]  pantoprazole  (PROTONIX) 40 MG tablet Take 1 tablet by mouth daily. 09/12/19  Yes [provider]  pregabalin (LYRICA) 50 MG capsule Take 1 capsule by mouth 1 day or 1 dose. 08/13/19  Yes [provider]  rosuvastatin (CRESTOR) 10 MG tablet TAKE 1 TABLET (10 MG TOTAL) BY MOUTH DAILY. PLEASE CALL TO SCHEDULE OFFICE VISIT FOR FURTHER REFILLS. THANK YOU! 09/22/19  Yes Gollan, Kathlene November, MD  nitroGLYCERIN (NITROSTAT) 0.4 MG SL tablet Place 1 tablet (0.4 mg total) under the tongue every 5 (five) minutes as needed. Patient not taking: Reported on 10/08/2019 07/30/17   Minna Merritts, MD    Family History  Problem Relation Age of Onset  . Coronary artery disease Mother        s/p CABG  . Heart failure Mother   . Heart disease Other   . Depression Other      Social History   Tobacco Use  . Smoking status: Former Smoker    Packs/day: 0.50    Years: 1.00    Pack years: 0.50    Types: Cigarettes    Quit date: 05/09/1968    Years since quitting: 51.4  . Smokeless tobacco: Never Used  Substance Use Topics  . Alcohol use: No  . Drug use: No    Allergies as of 10/08/2019 - Review Complete 10/08/2019  Allergen Reaction Noted  . Ofev [nintedanib]  08/03/2019  . Metoprolol succinate Other (See Comments) 07/13/2009  . Niacin Other (See Comments) 06/18/2009  . Sertraline hcl Other (See Comments) 06/18/2009    Review of Systems:    All systems reviewed and negative except where noted in HPI.   Physical Exam:  BP 127/73   Pulse 73   Temp (!) 97.3 F (36.3 C) (Oral)   Wt 156 lb (70.8 kg)   BMI 23.04 kg/m  No LMP for male patient. Psych:  Alert and cooperative. Normal mood and affect. General:   Alert,  Well-developed, well-nourished, pleasant and cooperative in NAD Head:  Normocephalic and atraumatic. Eyes:  Sclera clear, no icterus.   Conjunctiva pink. Ears:  Normal auditory acuity. Nose:  No deformity, discharge, or lesions. Mouth:  No deformity or lesions,oropharynx pink &  moist. Neck:  Supple; no masses or thyromegaly. Abdomen:  Normal bowel sounds.  No bruits.  Soft, non-tender and non-distended without masses, hepatosplenomegaly or hernias noted.  No guarding or rebound tenderness.    Msk:  Symmetrical without gross deformities. Good, equal movement & strength bilaterally. Pulses:  Normal pulses noted. Extremities:  No clubbing or edema.  No cyanosis. Neurologic:  Alert and oriented x3;  grossly normal neurologically. Skin:  Intact without significant lesions or rashes. No jaundice. Lymph Nodes:  No significant cervical adenopathy. Psych:  Alert and cooperative. Normal mood and affect.   Labs: CBC    Component Value Date/Time   WBC 12.9 (H) 08/03/2019 1957  RBC 4.74 08/03/2019 1957   HGB 13.1 08/03/2019 1957   HGB 13.8 03/24/2014 0000   HCT 40.3 08/03/2019 1957   HCT 42.6 03/24/2014 0000   PLT 323 08/03/2019 1957   PLT 212 03/24/2014 0000   MCV 85.0 08/03/2019 1957   MCV 88 03/24/2014 0000   MCH 27.6 08/03/2019 1957   MCHC 32.5 08/03/2019 1957   RDW 13.4 08/03/2019 1957   RDW 13.0 03/24/2014 0000   LYMPHSABS 2.3 08/03/2019 1957   LYMPHSABS 1.9 03/24/2014 0000   MONOABS 0.7 08/03/2019 1957   MONOABS 1.3 (H) 03/24/2014 0000   EOSABS 0.2 08/03/2019 1957   EOSABS 0.2 03/24/2014 0000   BASOSABS 0.1 08/03/2019 1957   BASOSABS 0.0 03/24/2014 0000   CMP     Component Value Date/Time   NA 138 08/03/2019 1957   NA 139 03/24/2014 0000   K 3.9 08/03/2019 1957   K 4.6 03/24/2014 0000   CL 105 08/03/2019 1957   CL 105 03/24/2014 0000   CO2 24 08/03/2019 1957   CO2 27 03/24/2014 0000   GLUCOSE 120 (H) 08/03/2019 1957   GLUCOSE 102 (H) 03/24/2014 0000   BUN 12 08/03/2019 1957   BUN 23 (H) 03/24/2014 0000   CREATININE 0.90 08/03/2019 1957   CREATININE 1.71 (H) 03/24/2014 0000   CALCIUM 9.2 08/03/2019 1957   CALCIUM 8.8 03/24/2014 0000   PROT 7.3 07/24/2019 2235   PROT 7.3 10/23/2013 1822   ALBUMIN 3.8 07/24/2019 2235   ALBUMIN 3.6  10/23/2013 1822   AST 114 (H) 07/24/2019 2235   AST 24 10/23/2013 1822   ALT 54 (H) 07/24/2019 2235   ALT 32 10/23/2013 1822   ALKPHOS 68 07/24/2019 2235   ALKPHOS 55 10/23/2013 1822   BILITOT 1.1 07/24/2019 2235   BILITOT 0.5 10/23/2013 1822   GFRNONAA >60 08/03/2019 1957   GFRNONAA 43 (L) 03/24/2014 0000   GFRNONAA 41 (L) 12/01/2013 2024   GFRAA >60 08/03/2019 1957   GFRAA 52 (L) 03/24/2014 0000   GFRAA 47 (L) 12/01/2013 2024    Imaging Studies: SLEEP STUDY DOCUMENTS  Result Date: 09/15/2019 Ordered by an unspecified provider.   Assessment and Plan:   Shane Luffman. is a 74 y.o. y/o male has been referred for dysphagia  Proceed with EGD for evaluation of symptoms Barium esophagram findings are benign  Obtain [facility] colonoscopy records  I have discussed alternative options, risks & benefits,  which include, but are not limited to, bleeding, infection, perforation,respiratory complication & drug reaction.  The patient agrees with this plan & written consent will be obtained.      Dr Shane Reid  Speech recognition software was used to dictate the above note.

## 2019-10-16 NOTE — Telephone Encounter (Signed)
Called Dr. Terence Lux office and ask for the status of the pulmonology clearance that I had faxed on 10/09/2019 and they stated that they had no received it, even though, I received confirmation. I told them that I would fax it again and that we needed it back as soon as possible since patient has his colonoscopy scheduled on 10/22/19. The receptionist stated that she would send a message to his nurse.

## 2019-10-19 NOTE — Progress Notes (Signed)
Patient ID: Shane Hales., male   DOB: [birth year], 74 y.o.   MRN: [id number] Cardiology Office Note  Date:  10/19/2019   ID:  Shane Frerking., DOB [birth year], MRN [id number]  PCP:  Elba Barman, MD   Chief Complaint  Patient presents with  . other    12 month f/u c/o sob and dry cough with amlodipine. Meds reviewed verbally with pt.    HPI:  Shane Reid is a 74 year old gentleman with a history of  coronary artery disease, PCI with LAD PCI in 2008,  chronic chest pain which is musculoskeletal,  traumatic injury to the chest,   esophageal strictures, status post esophageal stretching,  h/o left neck pain radiating to the arm Felt to be musculoskeletal, and  Chronic shortness of breath With CT scan documenting pulmonary fibrosis  who follows up for his coronary artery disease  Followed by pulmonary at [facility] Dr. Lanney Gins   CT scan documenting Pulmonary fibrosis (11/23/15)  Today wearing oxygen 2 to 3 liters Using flutter vest 3x a day  He is worried about the benazepril, chronic cough Hurts to breath Chronic sputum Cutting grass yesterday, without a mask on  Chronic fatigue Has had nasal surgery, when breathing in through nose hard, has collapse 97% in the office today, able to talk a lot today on his visit "mouth breather"  EKG personally reviewed by myself on todays visit NSR rate 74 bpm    Other past medical history Previous lung biopsy by Dr. Faith Rogue  Prior trauma to his mediastinum, Had severe rib pain, upper left mediastinal area,   previous stress test for chest pain 11/27/2013. There was no ischemia,   cardiac catheterization in 2013 showing patent stent, no severe stenoses that would contribute to his chest pain. Right heart catheterization was done at the same time that showed normal right heart pressures    PMH:   has a past medical history of Anxiety, Asthma, Bradycardia, CHF (congestive heart failure) ([place]), Chronic Chest Pain, COPD  (chronic obstructive pulmonary disease) ([place]), Coronary artery disease, Depression, Diabetes mellitus without complication ([place]), GERD (gastroesophageal reflux disease), Heart murmur, Hypercholesteremia, Hypertension, Hypertensive heart disease, Idiopathic pulmonary fibrosis ([place]), and Other social stressor.  PSH:    Past Surgical History:  Procedure Laterality Date  . CARDIAC CATHETERIZATION    . CORONARY ANGIOPLASTY     stents times 4  . Esophagus stretch    . EYE SURGERY     Bilateral cataracts  . LUNG BIOPSY    . NASAL ENDOSCOPY N/A 12/21/2016   Procedure: NASAL ENDOSCOPY;  Surgeon: Margaretha Sheffield, MD;  Location: [facility];  Service: ENT;  Laterality: N/A;  . TESTICLE SURGERY    . VIDEO ASSISTED THORACOSCOPY (VATS)/THOROCOTOMY Right 11/18/2014   Procedure: VIDEO ASSISTED THORACOSCOPY (VATS)/ wedge resection biopsy;  Surgeon: Nestor Lewandowsky, MD;  Location: [facility];  Service: General;  Laterality: Right;    Current Outpatient Medications  Medication Sig Dispense Refill  . Accu-Chek Softclix Lancets lancets USE AS DIRECTED EVERY DAY    . albuterol (PROVENTIL) (2.5 MG/3ML) 0.083% nebulizer solution Take 3 mLs (2.5 mg total) by nebulization every 6 (six) hours as needed. 75 mL 11  . albuterol (VENTOLIN HFA) 108 (90 Base) MCG/ACT inhaler Inhale 2 puffs into the lungs every 4 (four) hours as needed for wheezing.    [name] amLODipine-benazepril (LOTREL) 5-10 MG capsule Take 1 capsule by mouth daily. 90 capsule 0  . aspirin EC 81 MG tablet Take 81 mg by mouth  every morning.    . Blood Glucose Monitoring Suppl (ACCU-CHEK GUIDE ME) w/Device KIT DX E11.9 (MEDICAID PREFERRED)    . capsicum (ZOSTRIX) 0.075 % topical cream Apply topically.    . metFORMIN (GLUCOPHAGE) 500 MG tablet Take 1 tablet (500 mg total) by mouth 2 (two) times daily with a meal. (Patient taking differently: Take 1,000 mg by mouth 2 (two) times daily with a meal. ) 60 tablet 11  . Multiple Vitamins-Minerals (CENTRUM SILVER PO)  Take by mouth daily.    . nitroGLYCERIN (NITROSTAT) 0.4 MG SL tablet Place 1 tablet (0.4 mg total) under the tongue every 5 (five) minutes as needed. (Patient not taking: Reported on 10/08/2019) 25 tablet 0  . pantoprazole (PROTONIX) 40 MG tablet Take 1 tablet by mouth daily.    . pregabalin (LYRICA) 50 MG capsule Take 1 capsule by mouth 1 day or 1 dose.    . rosuvastatin (CRESTOR) 10 MG tablet TAKE 1 TABLET (10 MG TOTAL) BY MOUTH DAILY. PLEASE CALL TO SCHEDULE OFFICE VISIT FOR FURTHER REFILLS. THANK YOU! 30 tablet 0   No current facility-administered medications for this visit.     Allergies:   Ofev [nintedanib], Metoprolol succinate, Niacin, and Sertraline hcl   Social History:  The patient  reports that he quit smoking about 51 years ago. His smoking use included cigarettes. He has a 0.50 pack-year smoking history. He has never used smokeless tobacco. He reports that he does not drink alcohol or use drugs.   Family History:   family history includes Coronary artery disease in his mother; Depression in an other family member; Heart disease in an other family member; Heart failure in his mother.    Review of Systems: Review of Systems  Constitutional: Negative.   Respiratory: Positive for shortness of breath.  Chronic cough Cardiovascular: Negative.   Gastrointestinal: Negative.   Musculoskeletal: Positive for joint pain.  Knees, shoulders      Right shoulder Pain Neurological: Negative.   Psychiatric/Behavioral: Negative.   All other systems reviewed and are negative.   PHYSICAL EXAM: BP 110/64 (BP Location: Left Arm, Patient Position: Sitting, Cuff Size: Normal)   Pulse 84   Ht 5' 9" (1.753 m)   Wt 151 lb 6 oz (68.7 kg)   SpO2 97%   BMI 22.35 kg/m  Constitutional:  oriented to person, place, and time. No distress.  HENT:  Head: Grossly normal Eyes:  no discharge. No scleral icterus.  Neck: No JVD, no carotid bruits  Cardiovascular: Regular rate and rhythm, no murmurs  appreciated Pulmonary/Chest: Clear to auscultation bilaterally, +rales Abdominal: Soft.  no distension.  no tenderness.  Musculoskeletal: Normal range of motion Neurological:  normal muscle tone. Coordination normal. No atrophy Skin: Skin warm and dry Psychiatric: normal affect, pleasant   Recent Labs: 07/07/2019: B Natriuretic Peptide 35.0 07/24/2019: ALT 54 08/03/2019: BUN 12; Creatinine, Ser 0.90; Hemoglobin 13.1; Platelets 323; Potassium 3.9; Sodium 138    Lipid Panel Lab Results  Component Value Date   CHOL 94 (L) 10/02/2017   HDL 34 (L) 10/02/2017   LDLCALC 44 10/02/2017   TRIG 81 10/02/2017      Wt Readings from Last 3 Encounters:  10/08/19 156 lb (70.8 kg)  08/03/19 169 lb 1.5 oz (76.7 kg)  07/24/19 169 lb 1.5 oz (76.7 kg)      ASSESSMENT AND PLAN:  Chest pain, unspecified chest pain type - Plan: EKG 12-Lead Hx of Atypical pain, No pain at this time, "hurts to breath"  Syncope, unspecified syncope  type - Plan: EKG 12-Lead None recently, no near syncope stable  Hypertensive heart disease without heart failure Hold the ACE inh, secondary to chronic cough Stay on amlodipine 5 daily BP running low  Shortness of breath Long history of pulmonary fibrosis followed by pulmonary at [facility] On oxygen 2-3 liters Chronic cough  ILD (interstitial lung disease) ([place]) Seen by pulmonary Med changes as above  Deviated septum Previous surgery   Cad with stable angina Currently with no symptoms of angina. No further workup at this time. Continue current medication regimen.   Patient was seen personally in the office by myself  Total encounter time more than 25 minutes  Greater than 50% was spent in counseling and coordination of care with the patient   Disposition:   F/U  12 months   No orders of the defined types were placed in this encounter.    Signed, Esmond Plants, M.D., Ph.D. 10/19/2019  [place],  [place]

## 2019-10-20 ENCOUNTER — Other Ambulatory Visit
Admission: RE | Admit: 2019-10-20 | Discharge: 2019-10-20 | Disposition: A | Discharge status: Home / Self Care | Payer: Medicare Other | Source: Ambulatory Visit | Attending: Gastroenterology | Admitting: Gastroenterology

## 2019-10-20 ENCOUNTER — Other Ambulatory Visit: Payer: Self-pay

## 2019-10-20 DIAGNOSIS — Z01812 Encounter for preprocedural laboratory examination: Secondary | ICD-10-CM | POA: Insufficient documentation

## 2019-10-20 DIAGNOSIS — Z20822 Contact with and (suspected) exposure to covid-19: Secondary | ICD-10-CM | POA: Diagnosis not present

## 2019-10-20 LAB — SARS CORONAVIRUS 2 (TAT 6-24 HRS): SARS Coronavirus 2: NEGATIVE

## 2019-10-20 NOTE — Telephone Encounter (Signed)
Called Dr. Terence Lux office again today and ask if patient was able to proceed with his procedure or not on 10/22/19 with pulmonology clearance. However, they were not in the office today but the receptionist will send the nurse another message. At this time, we might have to reschedule his colonoscopy. We will see what else to do.

## 2019-10-21 ENCOUNTER — Ambulatory Visit (INDEPENDENT_AMBULATORY_CARE_PROVIDER_SITE_OTHER): Payer: Medicare Other | Admitting: Cardiovascular Disease

## 2019-10-21 ENCOUNTER — Encounter: Payer: Self-pay | Admitting: Cardiovascular Disease

## 2019-10-21 ENCOUNTER — Encounter: Payer: Self-pay | Admitting: Gastroenterology

## 2019-10-21 VITALS — BP 110/64 | HR 84 | Ht 69.0 in | Wt 151.4 lb

## 2019-10-21 DIAGNOSIS — R0602 Shortness of breath: Secondary | ICD-10-CM

## 2019-10-21 DIAGNOSIS — E785 Hyperlipidemia, unspecified: Secondary | ICD-10-CM

## 2019-10-21 DIAGNOSIS — I25118 Atherosclerotic heart disease of native coronary artery with other forms of angina pectoris: Secondary | ICD-10-CM | POA: Diagnosis not present

## 2019-10-21 DIAGNOSIS — I10 Essential (primary) hypertension: Secondary | ICD-10-CM

## 2019-10-21 DIAGNOSIS — J849 Interstitial pulmonary disease, unspecified: Secondary | ICD-10-CM | POA: Diagnosis not present

## 2019-10-21 MED ORDER — AMLODIPINE BESYLATE 5 MG PO TABS
5.0000 mg | ORAL_TABLET | Freq: Every day | ORAL | 3 refills | Status: DC
Start: 2019-10-21 — End: 2020-05-14

## 2019-10-21 MED ORDER — ROSUVASTATIN CALCIUM 10 MG PO TABS: ORAL_TABLET | ORAL | 2 refills | Status: DC

## 2019-10-21 NOTE — Patient Instructions (Addendum)
Medication Instructions:  Stop the amlodipine/benazepril (secondary to cough)  Start amlodipine 5 mg daily  If you need a refill on your cardiac medications before your next appointment, please call your pharmacy.    Lab work: No new labs needed   If you have labs (blood work) drawn today and your tests are completely normal, you will receive your results only by: [name] MyChart Message (if you have MyChart) OR . A paper copy in the mail If you have any lab test that is abnormal or we need to change your treatment, we will call you to review the results.   Testing/Procedures: No new testing needed   Follow-Up: At [facility], you and your health needs are our priority.  As part of our continuing mission to provide you with exceptional heart care, we have created designated Provider Care Teams.  These Care Teams include your primary Cardiologist (physician) and Advanced Practice Providers (APPs -  Physician Assistants and Nurse Practitioners) who all work together to provide you with the care you need, when you need it.  . You will need a follow up appointment in 12 months   . Providers on your designated Care Team:   . Nicolasa Ducking, NP . Eula Listen, PA-C . Marisue Ivan, PA-C  Any Other Special Instructions Will Be Listed Below (If Applicable).  For educational health videos Log in to : [email or web address] Or : [email or web address], password : [email or web address]

## 2019-10-22 ENCOUNTER — Ambulatory Visit: Payer: Medicare Other | Admitting: Certified Registered Nurse Anesthetist

## 2019-10-22 ENCOUNTER — Encounter
Admission: RE | Disposition: A | Discharge status: Home / Self Care | Payer: Self-pay | Source: Home / Self Care | Attending: Gastroenterology

## 2019-10-22 ENCOUNTER — Encounter: Payer: Self-pay | Admitting: Gastroenterology

## 2019-10-22 ENCOUNTER — Other Ambulatory Visit: Payer: Self-pay

## 2019-10-22 ENCOUNTER — Ambulatory Visit
Admission: RE | Admit: 2019-10-22 | Discharge: 2019-10-22 | Disposition: A | Discharge status: Home / Self Care | Payer: Medicare Other | Attending: Gastroenterology | Admitting: Gastroenterology

## 2019-10-22 DIAGNOSIS — Z7984 Long term (current) use of oral hypoglycemic drugs: Secondary | ICD-10-CM | POA: Insufficient documentation

## 2019-10-22 DIAGNOSIS — K219 Gastro-esophageal reflux disease without esophagitis: Secondary | ICD-10-CM | POA: Insufficient documentation

## 2019-10-22 DIAGNOSIS — J449 Chronic obstructive pulmonary disease, unspecified: Secondary | ICD-10-CM | POA: Diagnosis not present

## 2019-10-22 DIAGNOSIS — Z955 Presence of coronary angioplasty implant and graft: Secondary | ICD-10-CM | POA: Diagnosis not present

## 2019-10-22 DIAGNOSIS — Z7982 Long term (current) use of aspirin: Secondary | ICD-10-CM | POA: Diagnosis not present

## 2019-10-22 DIAGNOSIS — Z9981 Dependence on supplemental oxygen: Secondary | ICD-10-CM | POA: Diagnosis not present

## 2019-10-22 DIAGNOSIS — Z87891 Personal history of nicotine dependence: Secondary | ICD-10-CM | POA: Diagnosis not present

## 2019-10-22 DIAGNOSIS — F419 Anxiety disorder, unspecified: Secondary | ICD-10-CM | POA: Insufficient documentation

## 2019-10-22 DIAGNOSIS — E119 Type 2 diabetes mellitus without complications: Secondary | ICD-10-CM | POA: Insufficient documentation

## 2019-10-22 DIAGNOSIS — K3189 Other diseases of stomach and duodenum: Secondary | ICD-10-CM

## 2019-10-22 DIAGNOSIS — I2510 Atherosclerotic heart disease of native coronary artery without angina pectoris: Secondary | ICD-10-CM | POA: Diagnosis not present

## 2019-10-22 DIAGNOSIS — I110 Hypertensive heart disease with heart failure: Secondary | ICD-10-CM | POA: Insufficient documentation

## 2019-10-22 DIAGNOSIS — E7800 Pure hypercholesterolemia, unspecified: Secondary | ICD-10-CM | POA: Diagnosis not present

## 2019-10-22 DIAGNOSIS — F329 Major depressive disorder, single episode, unspecified: Secondary | ICD-10-CM | POA: Insufficient documentation

## 2019-10-22 DIAGNOSIS — Z79899 Other long term (current) drug therapy: Secondary | ICD-10-CM | POA: Insufficient documentation

## 2019-10-22 DIAGNOSIS — R1310 Dysphagia, unspecified: Secondary | ICD-10-CM | POA: Diagnosis present

## 2019-10-22 DIAGNOSIS — I509 Heart failure, unspecified: Secondary | ICD-10-CM | POA: Insufficient documentation

## 2019-10-22 HISTORY — PX: ESOPHAGOGASTRODUODENOSCOPY (EGD) WITH PROPOFOL: SHX5813

## 2019-10-22 LAB — GLUCOSE, CAPILLARY: Glucose-Capillary: 141 mg/dL — ABNORMAL HIGH (ref 70–99)

## 2019-10-22 SURGERY — ESOPHAGOGASTRODUODENOSCOPY (EGD) WITH PROPOFOL
Anesthesia: General

## 2019-10-22 MED ORDER — SODIUM CHLORIDE 0.9 % IV SOLN: INTRAVENOUS | Status: DC

## 2019-10-22 MED ORDER — PROPOFOL 500 MG/50ML IV EMUL
INTRAVENOUS | Status: DC | PRN
  Administered 2019-10-22: 100 ug/kg/min via INTRAVENOUS

## 2019-10-22 MED ORDER — LIDOCAINE HCL (CARDIAC) PF 100 MG/5ML IV SOSY
PREFILLED_SYRINGE | INTRAVENOUS | Status: DC | PRN
  Administered 2019-10-22: 30 mg via INTRAVENOUS

## 2019-10-22 MED ORDER — PROPOFOL 500 MG/50ML IV EMUL
INTRAVENOUS | Status: DC | PRN
Start: 2019-10-22 — End: 2019-10-22
  Administered 2019-10-22: 40 mg via INTRAVENOUS

## 2019-10-22 MED ORDER — PROPOFOL 10 MG/ML IV BOLUS
INTRAVENOUS | Status: AC
  Filled 2019-10-22: qty 40

## 2019-10-22 NOTE — Op Note (Signed)
[facility] Gastroenterology Patient Name: Von Quintanar Procedure Date: 10/22/2019 8:10 AM MRN: [id number] Account #: [id number] Date of Birth: 15-Feb-1946 Admit Type: Outpatient Age: 74 Room: [facility] ENDO ROOM 2 Gender: Male Note Status: Finalized Procedure:             Upper GI endoscopy Indications:           Dysphagia Providers:             Kallan Merrick B. Bonna Gains MD, MD Referring MD:          Forest Gleason Md, MD (Referring MD) Medicines:             Monitored Anesthesia Care Complications:         No immediate complications. Procedure:             Pre-Anesthesia Assessment:                        - Prior to the procedure, a History and Physical was                         performed, and patient medications, allergies and                         sensitivities were reviewed. The patient's tolerance                         of previous anesthesia was reviewed.                        - The risks and benefits of the procedure and the                         sedation options and risks were discussed with the                         patient. All questions were answered and informed                         consent was obtained.                        - Patient identification and proposed procedure were                         verified prior to the procedure by the physician, the                         nurse, the anesthesiologist, the anesthetist and the                         technician. The procedure was verified in the                         procedure room.                        - ASA Grade Assessment: II - A patient with mild                         systemic disease.  After obtaining informed consent, the endoscope was                         passed under direct vision. Throughout the procedure,                         the patient's blood pressure, pulse, and oxygen                         saturations were monitored continuously. The Endoscope                         was introduced through the mouth, and advanced to the                         second part of duodenum. The upper GI endoscopy was                         accomplished with ease. The patient tolerated the                         procedure well. Findings:      The examined esophagus was normal. 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      Patchy mildly erythematous mucosa without bleeding was found in the       gastric antrum. Biopsies were taken with a cold forceps for histology.       Biopsies were obtained in the gastric body, at the incisura and in the       gastric antrum with cold forceps for histology.      The duodenal bulb, second portion of the duodenum and examined duodenum       were normal. Impression:            - Normal esophagus. Biopsied.                        - Erythematous mucosa in the antrum. Biopsied.                        - Normal duodenal bulb, second portion of the duodenum                         and examined duodenum.                        - Biopsies were obtained in the gastric body, at the                         incisura and in the gastric antrum. Recommendation:        - Await pathology results.                        - Return to my office in 2 weeks.                        - Discharge patient to home (with escort).                        -  Advance diet as tolerated.                        - Continue present medications.                        - Patient has a contact number available for                         emergencies. The signs and symptoms of potential                         delayed complications were discussed with the patient.                         Return to normal activities tomorrow. Written                         discharge instructions were provided to the patient.                        - Discharge patient to home  (with escort).                        - The findings and recommendations were discussed with                         the patient.                        - The findings and recommendations were discussed with                         the patient's family. Procedure Code(s):     --- Professional ---                        [phone], Esophagogastroduodenoscopy, flexible,                         transoral; with biopsy, single or multiple Diagnosis Code(s):     --- Professional ---                        K31.89, Other diseases of stomach and duodenum                        R13.10, Dysphagia, unspecified CPT copyright 2019 American Medical Association. All rights reserved. The codes documented in this report are preliminary and upon coder review may  be revised to meet current compliance requirements.  Melodie Bouillon, MD Michel Bickers B. Maximino Greenland MD, MD 10/22/2019 8:23:38 AM This report has been signed electronically. Number of Addenda: 0 Note Initiated On: 10/22/2019 8:10 AM Estimated Blood Loss:  Estimated blood loss: none.      [facility]

## 2019-10-22 NOTE — Telephone Encounter (Signed)
We received clearance. Please his Media.

## 2019-10-22 NOTE — Anesthesia Preprocedure Evaluation (Signed)
Anesthesia Evaluation  Patient identified by MRN, date of birth, ID band Patient awake    Reviewed: Allergy & Precautions, NPO status , Patient's Chart, lab work & pertinent test results  History of Anesthesia Complications Negative for: history of anesthetic complications  Airway Mallampati: II  TM Distance: >3 FB Neck ROM: Full    Dental  (+) Edentulous Upper, Edentulous Lower   Pulmonary shortness of breath, neg sleep apnea, COPD,  COPD inhaler and oxygen dependent, former smoker,    breath sounds clear to auscultation- rhonchi (-) wheezing      Cardiovascular hypertension, (-) angina+ CAD, + Past MI, + Cardiac Stents (most recent stent 2008) and +CHF   Rhythm:Regular Rate:Normal - Systolic murmurs and - Diastolic murmurs Echo 02/15/17: - Left ventricle: The cavity size was normal. Systolic function was  normal. The estimated ejection fraction was in the range of 60%  to 65%. Wall motion was normal; there were no regional wall  motion abnormalities. Left ventricular diastolic function  parameters were normal.  - Mitral valve: There was mild regurgitation.  - Left atrium: The atrium was normal in size.  - Right ventricle: Systolic function was normal.  - Pulmonary arteries: Systolic pressure was within the normal  range.     Neuro/Psych neg Seizures PSYCHIATRIC DISORDERS Anxiety Depression negative neurological ROS     GI/Hepatic Neg liver ROS, GERD  ,  Endo/Other  diabetes, Oral Hypoglycemic Agents  Renal/GU negative Renal ROS     Musculoskeletal negative musculoskeletal ROS (+)   Abdominal (+) - obese,   Peds  Hematology negative hematology ROS (+)   Anesthesia Other Findings Past Medical History: No date: Anxiety No date: Asthma No date: Bradycardia No date: CHF (congestive heart failure) (HCC) No date: Chronic Chest Pain No date: COPD (chronic obstructive pulmonary disease) (HCC) No date:  Coronary artery disease     Comment:  a. 2008 s/p PCI to LAD;  b. 2013 Cath: patent stent->Med              Rx;  c. 11/2013 MV: EF 67%, no ischemia/infarct. No date: Depression No date: Diabetes mellitus without complication (HCC) No date: GERD (gastroesophageal reflux disease) No date: Heart murmur     Comment:  a. 02/2010 Echo: EF 55-60%, no rwma, Gr 2 DD, triv MR,               mildly dil LA, nl RV. No date: Hypercholesteremia No date: Hypertension No date: Hypertensive heart disease No date: Idiopathic pulmonary fibrosis (HCC)     Comment:  a. Seen by Dr. Dema Severin 01/2015 and Rx Esbriet - pt cannot               afford. No date: Other social stressor     Comment:  a. wife with mental illness   Reproductive/Obstetrics                             Anesthesia Physical Anesthesia Plan  ASA: IV  Anesthesia Plan: General   Post-op Pain Management:    Induction: Intravenous  PONV Risk Score and Plan: 1 and Propofol infusion  Airway Management Planned: Natural Airway  Additional Equipment:   Intra-op Plan:   Post-operative Plan:   Informed Consent: I have reviewed the patients History and Physical, chart, labs and discussed the procedure including the risks, benefits and alternatives for the proposed anesthesia with the patient or authorized representative who has indicated his/her understanding and acceptance.  Dental advisory given  Plan Discussed with: CRNA and Anesthesiologist  Anesthesia Plan Comments:         Anesthesia Quick Evaluation

## 2019-10-22 NOTE — Anesthesia Postprocedure Evaluation (Signed)
Anesthesia Post Note  Patient: Brentton Wardlow.  Procedure(s) Performed: ESOPHAGOGASTRODUODENOSCOPY (EGD) WITH PROPOFOL (N/A )  Patient location during evaluation: Endoscopy Anesthesia Type: General Level of consciousness: awake and alert and oriented Pain management: pain level controlled Vital Signs Assessment: post-procedure vital signs reviewed and stable Respiratory status: spontaneous breathing, nonlabored ventilation and respiratory function stable Cardiovascular status: blood pressure returned to baseline and stable Postop Assessment: no signs of nausea or vomiting Anesthetic complications: no     Last Vitals:  Vitals:   10/22/19 0825 10/22/19 0854  BP: 120/73 139/88  Pulse: 65   Resp: (!) 27   Temp:    SpO2: 96%     Last Pain:  Vitals:   10/22/19 0824  TempSrc: Temporal  PainSc:                  Gwendalyn Mcgonagle

## 2019-10-22 NOTE — H&P (Signed)
Shane Antigua, MD [address] [place], [place], [place], [address] [address], [place], [place], [place], [address] Phone: [phone]  Fax: [phone]  Primary Care Physician:  Elba Barman, MD   Pre-Procedure History & Physical: HPI:  Shane Reid. is a 74 y.o. male is here for an EGD.   Past Medical History:  Diagnosis Date  . Anxiety   . Asthma   . Bradycardia   . CHF (congestive heart failure) ([place])   . Chronic Chest Pain   . COPD (chronic obstructive pulmonary disease) ([place])   . Coronary artery disease    a. 2008 s/p PCI to LAD;  b. 2013 Cath: patent stent->Med Rx;  c. 11/2013 MV: EF 67%, no ischemia/infarct.  . Depression   . Diabetes mellitus without complication ([place])   . GERD (gastroesophageal reflux disease)   . Heart murmur    a. 02/2010 Echo: EF 55-60%, no rwma, Gr 2 DD, triv MR, mildly dil LA, nl RV.  [name] Hypercholesteremia   . Hypertension   . Hypertensive heart disease   . Idiopathic pulmonary fibrosis ([place])    a. Seen by Dr. Stevenson Clinch 01/2015 and Rx Esbriet - pt cannot afford.  . Other social stressor    a. wife with mental illness    Past Surgical History:  Procedure Laterality Date  . CARDIAC CATHETERIZATION    . CORONARY ANGIOPLASTY     stents times 4  . Esophagus stretch    . EYE SURGERY     Bilateral cataracts  . LUNG BIOPSY    . NASAL ENDOSCOPY N/A 12/21/2016   Procedure: NASAL ENDOSCOPY;  Surgeon: Margaretha Sheffield, MD;  Location: [facility];  Service: ENT;  Laterality: N/A;  . TESTICLE SURGERY    . VIDEO ASSISTED THORACOSCOPY (VATS)/THOROCOTOMY Right 11/18/2014   Procedure: VIDEO ASSISTED THORACOSCOPY (VATS)/ wedge resection biopsy;  Surgeon: Nestor Lewandowsky, MD;  Location: [facility];  Service: General;  Laterality: Right;    Prior to Admission medications   Medication Sig Start Date End Date Taking? Authorizing Provider  albuterol (PROVENTIL) (2.5 MG/3ML) 0.083% nebulizer solution Take 3 mLs (2.5 mg total) by  nebulization every 6 (six) hours as needed. 05/02/12  Yes Gollan, Kathlene November, MD  amLODipine (NORVASC) 5 MG tablet Take 1 tablet (5 mg total) by mouth daily. 10/21/19  Yes Minna Merritts, MD  aspirin EC 81 MG tablet Take 81 mg by mouth every morning. 08/06/12  Yes [provider]  Multiple Vitamins-Minerals (CENTRUM SILVER PO) Take by mouth daily.   Yes [provider]  pantoprazole (PROTONIX) 40 MG tablet Take 1 tablet by mouth daily. 09/12/19  Yes [provider]  Accu-Chek Softclix Lancets lancets USE AS DIRECTED EVERY DAY 04/12/19   [provider]  albuterol (VENTOLIN HFA) 108 (90 Base) MCG/ACT inhaler Inhale 2 puffs into the lungs every 4 (four) hours as needed for wheezing. 01/22/06   [provider]  Blood Glucose Monitoring Suppl (ACCU-CHEK GUIDE ME) w/Device KIT DX E11.9 (MEDICAID PREFERRED) 04/11/19   [provider]  capsicum (ZOSTRIX) 0.075 % topical cream Apply topically. 06/15/19 06/14/20  [provider]  metFORMIN (GLUCOPHAGE) 500 MG tablet Take 1 tablet (500 mg total) by mouth 2 (two) times daily with a meal. Patient taking differently: Take 1,000 mg by mouth 2 (two) times daily with a meal.  04/09/19 04/08/20  Earleen Newport, MD  nitroGLYCERIN (NITROSTAT) 0.4 MG SL tablet Place 1 tablet (0.4 mg total) under the tongue every 5 (  five) minutes as needed. 07/30/17   Minna Merritts, MD  pregabalin (LYRICA) 50 MG capsule Take 1 capsule by mouth 1 day or 1 dose. 08/13/19   [provider]  rosuvastatin (CRESTOR) 10 MG tablet TAKE 1 TABLET (10 MG TOTAL) BY MOUTH DAILY. 10/21/19   Minna Merritts, MD    Allergies as of 10/08/2019 - Review Complete 10/08/2019  Allergen Reaction Noted  . Ofev [nintedanib]  08/03/2019  . Metoprolol succinate Other (See Comments) 07/13/2009  . Niacin Other (See Comments) 06/18/2009  . Sertraline hcl Other (See Comments) 06/18/2009    Family History  Problem Relation Age of Onset   . Coronary artery disease Mother        s/p CABG  . Heart failure Mother   . Heart disease Other   . Depression Other     Social History   Socioeconomic History  . Marital status: Divorced    Spouse name: Not on file  . Number of children: Not on file  . Years of education: Not on file  . Highest education level: Not on file  Occupational History  . Not on file  Tobacco Use  . Smoking status: Former Smoker    Packs/day: 0.50    Years: 1.00    Pack years: 0.50    Types: Cigarettes    Quit date: 05/09/1968    Years since quitting: 51.4  . Smokeless tobacco: Never Used  Substance and Sexual Activity  . Alcohol use: No  . Drug use: No  . Sexual activity: Not on file  Other Topics Concern  . Not on file  Social History Narrative   Divorced but lives with ex-wife   On SSI disability, wife with mental illness      Social Determinants of Health   Financial Resource Strain:   . Difficulty of Paying Living Expenses:   Food Insecurity:   . Worried About Charity fundraiser in the Last Year:   . Arboriculturist in the Last Year:   Transportation Needs:   . Film/video editor (Medical):   [name] Lack of Transportation (Non-Medical):   Physical Activity:   . Days of Exercise per Week:   . Minutes of Exercise per Session:   Stress:   . Feeling of Stress :   Social Connections:   . Frequency of Communication with Friends and Family:   . Frequency of Social Gatherings with Friends and Family:   . Attends Religious Services:   . Active Member of Clubs or Organizations:   . Attends Archivist Meetings:   [name] Marital Status:   Intimate Partner Violence:   . Fear of Current or Ex-Partner:   . Emotionally Abused:   [name] Physically Abused:   . Sexually Abused:     Review of Systems: See HPI, otherwise negative ROS  Physical Exam: BP 130/75   Pulse 62   Temp (!) 97.4 F (36.3 C) (Temporal)   Resp (!) 24   Ht 5' 9"  (1.753 m)   Wt 68.5 kg   SpO2 100%   BMI  22.30 kg/m  General:   Alert,  pleasant and cooperative in NAD Head:  Normocephalic and atraumatic. Neck:  Supple; no masses or thyromegaly. Lungs:  Clear throughout to auscultation, normal respiratory effort.    Heart:  +S1, +S2, Regular rate and rhythm, No edema. Abdomen:  Soft, nontender and nondistended. Normal bowel sounds, without guarding, and without rebound.   Neurologic:  Alert and  oriented  x4;  grossly normal neurologically.  Impression/Plan: Shane Hales. is here for an EGD for dysphagia  Risks, benefits, limitations, and alternatives regarding the procedure have been reviewed with the patient.  Questions have been answered.  All parties agreeable.   Virgel Manifold, MD  10/22/2019, 8:09 AM

## 2019-10-22 NOTE — Transfer of Care (Signed)
Immediate Anesthesia Transfer of Care Note  Patient: Shane Reid.  Procedure(s) Performed: ESOPHAGOGASTRODUODENOSCOPY (EGD) WITH PROPOFOL (N/A )  Patient Location: PACU  Anesthesia Type:General  Level of Consciousness: awake and drowsy  Airway & Oxygen Therapy: Patient Spontanous Breathing  Post-op Assessment: Report given to RN  Post vital signs: Reviewed and stable  Last Vitals:  Vitals Value Taken Time  BP 120/73 10/22/19 0825  Temp 35.9 C 10/22/19 0824  Pulse 62 10/22/19 0825  Resp 24 10/22/19 0825  SpO2 98 % 10/22/19 0825  Vitals shown include unvalidated device data.  Last Pain:  Vitals:   10/22/19 0824  TempSrc: Temporal  PainSc:          Complications: No apparent anesthesia complications

## 2019-10-23 ENCOUNTER — Encounter: Payer: Self-pay | Admitting: *Deleted

## 2019-10-23 LAB — SURGICAL PATHOLOGY

## 2019-11-10 ENCOUNTER — Encounter: Payer: Self-pay | Admitting: Gastroenterology

## 2019-11-10 ENCOUNTER — Ambulatory Visit (INDEPENDENT_AMBULATORY_CARE_PROVIDER_SITE_OTHER): Payer: Medicare Other | Admitting: Gastroenterology

## 2019-11-10 ENCOUNTER — Other Ambulatory Visit: Payer: Self-pay

## 2019-11-10 VITALS — BP 133/70 | HR 76 | Temp 97.5°F | Wt 155.0 lb

## 2019-11-10 DIAGNOSIS — R0989 Other specified symptoms and signs involving the circulatory and respiratory systems: Secondary | ICD-10-CM | POA: Diagnosis not present

## 2019-11-10 MED ORDER — PANTOPRAZOLE SODIUM 40 MG PO TBEC: 40.0000 mg | DELAYED_RELEASE_TABLET | Freq: Every day | ORAL | 0 refills | Status: DC

## 2019-11-10 NOTE — Addendum Note (Signed)
Addended by: Adela Ports on: 11/10/2019 02:12 PM   Modules accepted: Orders

## 2019-11-10 NOTE — Progress Notes (Signed)
[name], MD [address]  [place]  [place], [place] [address]  Main: [phone]  Fax: [phone]   Primary Care Physician: Elba Barman, MD   Chief complaint: Scratchy throat  HPI: Shane Reid. is a 74 y.o. male here for follow-up of dysphagia.  EGD showed gastric erythema and was otherwise normal.  Biopsies were benign.  We have obtained records from [facility], see 11/06/2019 scanned records under media.  Patient help pH study and manometry in 2017 there.  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  pH study reported "the distal esophageal acid exposure was moderate the abnormal during both upright and recumbent positions."  However, gastroesophageal reflux events were reported to be normal absolute number.  Patient is also reporting constipation.  No blood in stool.  Reports globus sensation.  Dysphagia only to pills, not to food   Current Outpatient Medications  Medication Sig Dispense Refill   Accu-Chek Softclix Lancets lancets USE AS DIRECTED EVERY DAY     albuterol (PROVENTIL) (2.5 MG/3ML) 0.083% nebulizer solution Take 3 mLs (2.5 mg total) by nebulization every 6 (six) hours as needed. 75 mL 11   albuterol (VENTOLIN HFA) 108 (90 Base) MCG/ACT inhaler Inhale 2 puffs into the lungs every 4 (four) hours as needed for wheezing.     amLODipine (NORVASC) 5 MG tablet Take 1 tablet (5 mg total) by mouth daily. 90 tablet 3   aspirin EC 81 MG tablet Take 81 mg by mouth every morning.     Blood Glucose Monitoring Suppl (ACCU-CHEK GUIDE ME) w/Device KIT DX E11.9 (MEDICAID PREFERRED)     capsicum (ZOSTRIX) 0.075 % topical cream Apply topically.     diclofenac Sodium (VOLTAREN) 1 % GEL      gabapentin (NEURONTIN) 300 MG capsule Take 300 mg by mouth 3 (three) times daily.     metFORMIN (GLUCOPHAGE) 500 MG tablet Take 1 tablet (500 mg total) by mouth 2 (two) times daily with a meal. (Patient taking differently: Take 1,000 mg by mouth 2 (two) times daily  with a meal. ) 60 tablet 11   Multiple Vitamins-Minerals (CENTRUM SILVER PO) Take by mouth daily.     nitroGLYCERIN (NITROSTAT) 0.4 MG SL tablet Place 1 tablet (0.4 mg total) under the tongue every 5 (five) minutes as needed. 25 tablet 0   pantoprazole (PROTONIX) 40 MG tablet Take 1 tablet by mouth daily.     pregabalin (LYRICA) 50 MG capsule Take 1 capsule by mouth 1 day or 1 dose.     rosuvastatin (CRESTOR) 10 MG tablet TAKE 1 TABLET (10 MG TOTAL) BY MOUTH DAILY. 90 tablet 2   No current facility-administered medications for this visit.    Allergies as of 11/10/2019 - Review Complete 11/10/2019  Allergen Reaction Noted   Ofev [nintedanib]  08/03/2019   Metoprolol succinate Other (See Comments) 07/13/2009   Niacin Other (See Comments) 06/18/2009   Sertraline hcl Other (See Comments) 06/18/2009    ROS:  General: Negative for anorexia, weight loss, fever, chills, fatigue, weakness. ENT: Negative for hoarseness, difficulty swallowing , nasal congestion. CV: Negative for chest pain, angina, palpitations, dyspnea on exertion, peripheral edema.  Respiratory: Negative for dyspnea at rest, dyspnea on exertion, cough, sputum, wheezing.  GI: See history of present illness. GU:  Negative for dysuria, hematuria, urinary incontinence, urinary frequency, nocturnal urination.  Endo: Negative for unusual weight change.    Physical Examination:   BP 133/70    Pulse 76    Temp (!) 97.5 F (  36.4 C) (Oral)    Wt 155 lb (70.3 kg)    BMI 22.89 kg/m   General: Well-nourished, well-developed in no acute distress.  Eyes: No icterus. Conjunctivae pink. Mouth: Oropharyngeal mucosa moist and pink , no lesions erythema or exudate. Neck: Supple, Trachea midline Abdomen: Bowel sounds are normal, nontender, nondistended, no hepatosplenomegaly or masses, no abdominal bruits or hernia , no rebound or guarding.   Extremities: No lower extremity edema. No clubbing or deformities. Neuro: Alert and  oriented x 3.  Grossly intact. Skin: Warm and dry, no jaundice.   Psych: Alert and cooperative, normal mood and affect.   Labs: CMP     Component Value Date/Time   NA 138 08/03/2019 1957   NA 139 03/24/2014 0000   K 3.9 08/03/2019 1957   K 4.6 03/24/2014 0000   CL 105 08/03/2019 1957   CL 105 03/24/2014 0000   CO2 24 08/03/2019 1957   CO2 27 03/24/2014 0000   GLUCOSE 120 (H) 08/03/2019 1957   GLUCOSE 102 (H) 03/24/2014 0000   BUN 12 08/03/2019 1957   BUN 23 (H) 03/24/2014 0000   CREATININE 0.90 08/03/2019 1957   CREATININE 1.71 (H) 03/24/2014 0000   CALCIUM 9.2 08/03/2019 1957   CALCIUM 8.8 03/24/2014 0000   PROT 7.3 07/24/2019 2235   PROT 7.3 10/23/2013 1822   ALBUMIN 3.8 07/24/2019 2235   ALBUMIN 3.6 10/23/2013 1822   AST 114 (H) 07/24/2019 2235   AST 24 10/23/2013 1822   ALT 54 (H) 07/24/2019 2235   ALT 32 10/23/2013 1822   ALKPHOS 68 07/24/2019 2235   ALKPHOS 55 10/23/2013 1822   BILITOT 1.1 07/24/2019 2235   BILITOT 0.5 10/23/2013 1822   GFRNONAA >60 08/03/2019 1957   GFRNONAA 43 (L) 03/24/2014 0000   GFRNONAA 41 (L) 12/01/2013 2024   GFRAA >60 08/03/2019 1957   GFRAA 52 (L) 03/24/2014 0000   GFRAA 47 (L) 12/01/2013 2024   Lab Results  Component Value Date   WBC 12.9 (H) 08/03/2019   HGB 13.1 08/03/2019   HCT 40.3 08/03/2019   MCV 85.0 08/03/2019   PLT 323 08/03/2019    Imaging Studies: No results found.  Assessment and Plan:   Shane Reid. is a 74 y.o. y/o male here for follow-up of dysphagia  Symptoms most consistent with globus sensation Symptoms could also be due to ineffective esophageal motility  Given reflux noted on pH study, globus sensation from reflux, will increase his PPI as that help with ineffective esophageal motility symptoms as well  Increase Protonix to twice daily, 30 to 45 minutes before breakfast and dinner discussed  (Risks of PPI use were discussed with patient including bone loss, C. Diff diarrhea, pneumonia,  infections, CKD, electrolyte abnormalities.  Pt. Verbalizes understanding and chooses to continue the medication.)  High-fiber diet MiraLAX  daily with goal of 1-2 soft bowel movements daily.  If not at goal, patient instructed to increase dose to twice daily.  If loose stools with the medication, patient asked to decrease the medication to every other day, or half dose daily.  Patient verbalized understanding    Dr [name]

## 2019-11-10 NOTE — Patient Instructions (Addendum)
Polyethylene Glycol powder What is this medicine? POLYETHYLENE GLYCOL 3350 (pol ee ETH i leen; GLYE col) powder is a laxative used to treat constipation. It increases the amount of water in the stool. Bowel movements become easier and more frequent. This medicine may be used for other purposes; ask your health care provider or pharmacist if you have questions. COMMON BRAND NAME(S): GaviLax, GIALAX, GlycoLax, Healthylax, MiraLax, Smooth LAX, Vita Health What should I tell my health care provider before I take this medicine? They need to know if you have any of these conditions:  a history of blockage of the stomach or intestine  current abdomen distension or pain  difficulty swallowing  diverticulitis, ulcerative colitis, or other chronic bowel disease  phenylketonuria  an unusual or allergic reaction to polyethylene glycol, other medicines, dyes, or preservatives  pregnant or trying to get pregnant  breast-feeding How should I use this medicine? Take this medicine by mouth. The bottle has a measuring cap that is marked with a line. Pour the powder into the cap up to the marked line (the dose is about 1 heaping tablespoon). Add the powder in the cap to a full glass (4 to 8 ounces or 120 to 240 mL) of water, juice, soda, coffee or tea. Mix the powder well. Ensure that the powder is fully dissolved. Do not drink if there are any clumps. Drink the solution. Take exactly as directed. Do not take your medicine more often than directed. Talk to your pediatrician regarding the use of this medicine in children. Special care may be needed. Overdosage: If you think you have taken too much of this medicine contact a poison control center or emergency room at once. NOTE: This medicine is only for you. Do not share this medicine with others. What if I miss a dose? If you miss a dose, take it as soon as you can. If it is almost time for your next dose, take only that dose. Do not take double or extra  doses. What may interact with this medicine? Interactions are not expected. This list may not describe all possible interactions. Give your health care provider a list of all the medicines, herbs, non-prescription drugs, or dietary supplements you use. Also tell them if you smoke, drink alcohol, or use illegal drugs. Some items may interact with your medicine. What should I watch for while using this medicine? Do not use for more than 2 weeks without advice from your doctor or health care professional. It can take 2 to 4 days to have a bowel movement and to experience improvement in constipation. See your health care professional for any changes in bowel habits, including constipation, that are severe or last longer than three weeks. Always take this medicine with plenty of water. What side effects may I notice from receiving this medicine? Side effects that you should report to your doctor or health care professional as soon as possible:  diarrhea  difficulty breathing  itching of the skin, hives, or skin rash  severe bloating, pain, or distension of the stomach  vomiting Side effects that usually do not require medical attention (report to your doctor or health care professional if they continue or are bothersome):  bloating or gas  lower abdominal discomfort or cramps  nausea This list may not describe all possible side effects. Call your doctor for medical advice about side effects. You may report side effects to FDA at 1-800-FDA-1088. Where should I keep my medicine? Keep out of the reach of children.   Store between 15 and 30 degrees C (59 and 86 degrees F). Throw away any unused medicine after the expiration date. NOTE: This sheet is a summary. It may not cover all possible information. If you have questions about this medicine, talk to your doctor, pharmacist, or health care provider.  2020 Elsevier/Gold Standard (2017-11-01 10:42:01)   High-Fiber Diet Fiber, also called  dietary fiber, is a type of carbohydrate that is found in fruits, vegetables, whole grains, and beans. A high-fiber diet can have many health benefits. Your health care provider may recommend a high-fiber diet to help:  Prevent constipation. Fiber can make your bowel movements more regular.  Lower your cholesterol.  Relieve the following conditions: ? Swelling of veins in the anus (hemorrhoids). ? Swelling and irritation (inflammation) of specific areas of the digestive tract (uncomplicated diverticulosis). ? A problem of the large intestine (colon) that sometimes causes pain and diarrhea (irritable bowel syndrome, IBS).  Prevent overeating as part of a weight-loss plan.  Prevent heart disease, type 2 diabetes, and certain cancers. What is my plan? The recommended daily fiber intake in grams (g) includes:  38 g for men age 50 or younger.  30 g for men over age 50.  25 g for women age 50 or younger.  21 g for women over age 50. You can get the recommended daily intake of dietary fiber by:  Eating a variety of fruits, vegetables, grains, and beans.  Taking a fiber supplement, if it is not possible to get enough fiber through your diet. What do I need to know about a high-fiber diet?  It is better to get fiber through food sources rather than from fiber supplements. There is not a lot of research about how effective supplements are.  Always check the fiber content on the nutrition facts label of any prepackaged food. Look for foods that contain 5 g of fiber or more per serving.  Talk with a diet and nutrition specialist (dietitian) if you have questions about specific foods that are recommended or not recommended for your medical condition, especially if those foods are not listed below.  Gradually increase how much fiber you consume. If you increase your intake of dietary fiber too quickly, you may have bloating, cramping, or gas.  Drink plenty of water. Water helps you to digest  fiber. What are tips for following this plan?  Eat a wide variety of high-fiber foods.  Make sure that half of the grains that you eat each day are whole grains.  Eat breads and cereals that are made with whole-grain flour instead of refined flour or white flour.  Eat brown rice, bulgur wheat, or millet instead of white rice.  Start the day with a breakfast that is high in fiber, such as a cereal that contains 5 g of fiber or more per serving.  Use beans in place of meat in soups, salads, and pasta dishes.  Eat high-fiber snacks, such as berries, raw vegetables, nuts, and popcorn.  Choose whole fruits and vegetables instead of processed forms like juice or sauce. What foods can I eat?  Fruits Berries. Pears. Apples. Oranges. Avocado. Prunes and raisins. Dried figs. Vegetables Sweet potatoes. Spinach. Kale. Artichokes. Cabbage. Broccoli. Cauliflower. Green peas. Carrots. Squash. Grains Whole-grain breads. Multigrain cereal. Oats and oatmeal. Brown rice. Barley. Bulgur wheat. Millet. Quinoa. Bran muffins. Popcorn. Rye wafer crackers. Meats and other proteins Navy, kidney, and pinto beans. Soybeans. Split peas. Lentils. Nuts and seeds. Dairy Fiber-fortified yogurt. Beverages Fiber-fortified soy milk.   Fiber-fortified orange juice. Other foods Fiber bars. The items listed above may not be a complete list of recommended foods and beverages. Contact a dietitian for more options. What foods are not recommended? Fruits Fruit juice. Cooked, strained fruit. Vegetables Fried potatoes. Canned vegetables. Well-cooked vegetables. Grains White bread. Pasta made with refined flour. White rice. Meats and other proteins Fatty cuts of meat. Fried chicken or fried fish. Dairy Milk. Yogurt. Cream cheese. Sour cream. Fats and oils Butters. Beverages Soft drinks. Other foods Cakes and pastries. The items listed above may not be a complete list of foods and beverages to avoid. Contact a  dietitian for more information. Summary  Fiber is a type of carbohydrate. It is found in fruits, vegetables, whole grains, and beans.  There are many health benefits of eating a high-fiber diet, such as preventing constipation, lowering blood cholesterol, helping with weight loss, and reducing your risk of heart disease, diabetes, and certain cancers.  Gradually increase your intake of fiber. Increasing too fast can result in cramping, bloating, and gas. Drink plenty of water while you increase your fiber.  The best sources of fiber include whole fruits and vegetables, whole grains, nuts, seeds, and beans. This information is not intended to replace advice given to you by your health care provider. Make sure you discuss any questions you have with your health care provider. Document Revised: 03/19/2017 Document Reviewed: 03/19/2017 Elsevier Patient Education  2020 Elsevier Inc.   

## 2019-11-13 ENCOUNTER — Encounter: Payer: Self-pay | Admitting: Gastroenterology

## 2019-11-13 ENCOUNTER — Encounter: Payer: Self-pay | Admitting: Internal Medicine

## 2019-12-25 ENCOUNTER — Telehealth: Payer: Self-pay

## 2019-12-25 NOTE — Telephone Encounter (Signed)
Patient stated that his throat had been feeling sore and raw ever since he had the EGD done from 10/22/2019. Patient stated that it had been difficult to swallow, eat or drink anything. Patient stated that it had been challenging to deal with this pain and that it has been getting worse and that he also started to notice blood when he spits. Patient would like to know what he could do. Please advise.

## 2019-12-26 NOTE — Telephone Encounter (Signed)
Called patient to let him know what Dr. Michele Mcalpine recommendations were and he stated that he would call and set-up an appointment. Patient also stated that he will reach out to Dr. Ernestine Mcmurray and see what he tells him as well. I told patient to call us if he had further questions.

## 2019-12-28 ENCOUNTER — Emergency Department: Payer: Medicare Other

## 2019-12-28 ENCOUNTER — Emergency Department
Admission: EM | Admit: 2019-12-28 | Discharge: 2019-12-28 | Disposition: A | Discharge status: Home / Self Care | Payer: Medicare Other | Attending: Emergency Medicine | Admitting: Emergency Medicine

## 2019-12-28 ENCOUNTER — Other Ambulatory Visit: Payer: Self-pay

## 2019-12-28 DIAGNOSIS — I2510 Atherosclerotic heart disease of native coronary artery without angina pectoris: Secondary | ICD-10-CM | POA: Diagnosis not present

## 2019-12-28 DIAGNOSIS — Z7982 Long term (current) use of aspirin: Secondary | ICD-10-CM | POA: Insufficient documentation

## 2019-12-28 DIAGNOSIS — I110 Hypertensive heart disease with heart failure: Secondary | ICD-10-CM | POA: Insufficient documentation

## 2019-12-28 DIAGNOSIS — J45909 Unspecified asthma, uncomplicated: Secondary | ICD-10-CM | POA: Diagnosis not present

## 2019-12-28 DIAGNOSIS — Z87891 Personal history of nicotine dependence: Secondary | ICD-10-CM | POA: Insufficient documentation

## 2019-12-28 DIAGNOSIS — K5900 Constipation, unspecified: Secondary | ICD-10-CM | POA: Insufficient documentation

## 2019-12-28 DIAGNOSIS — Z79899 Other long term (current) drug therapy: Secondary | ICD-10-CM | POA: Diagnosis not present

## 2019-12-28 DIAGNOSIS — I509 Heart failure, unspecified: Secondary | ICD-10-CM | POA: Insufficient documentation

## 2019-12-28 DIAGNOSIS — Z7984 Long term (current) use of oral hypoglycemic drugs: Secondary | ICD-10-CM | POA: Diagnosis not present

## 2019-12-28 DIAGNOSIS — J441 Chronic obstructive pulmonary disease with (acute) exacerbation: Secondary | ICD-10-CM | POA: Insufficient documentation

## 2019-12-28 DIAGNOSIS — E119 Type 2 diabetes mellitus without complications: Secondary | ICD-10-CM | POA: Insufficient documentation

## 2019-12-28 DIAGNOSIS — Z955 Presence of coronary angioplasty implant and graft: Secondary | ICD-10-CM | POA: Insufficient documentation

## 2019-12-28 MED ORDER — LACTULOSE 10 GM/15ML PO SOLN
20.0000 g | Freq: Every day | ORAL | 0 refills | Status: DC | PRN
Start: 2019-12-28 — End: 2022-10-26

## 2019-12-28 MED ORDER — LACTULOSE 10 GM/15ML PO SOLN
30.0000 g | Freq: Once | ORAL | Status: AC
  Administered 2019-12-28: 30 g via ORAL
  Filled 2019-12-28: qty 60

## 2019-12-28 NOTE — Discharge Instructions (Signed)
1.  You may take lactulose as needed for bowel movements. 2.  I recommend the following over-the-counter medications to regulate daily bowel movements: MiraLAX Stool softener such as Colace Fiber Drink plenty of fluids 3.  Return to the ER for worsening symptoms, persistent vomiting, difficulty breathing or other concerns.

## 2019-12-28 NOTE — ED Notes (Signed)
ED Provider at bedside. 

## 2019-12-28 NOTE — ED Triage Notes (Signed)
Patient reports constipation over the past month.  States has tried several different medications but only able to have small results.

## 2019-12-28 NOTE — ED Provider Notes (Signed)
[facility] Emergency Department Provider Note   ____________________________________________   First MD Initiated Contact with Patient 12/28/19 [phone]     (approximate)  I have reviewed the triage vital signs and the nursing notes.   HISTORY  Chief Complaint Constipation    HPI Mckinnley Smithey. is a 74 y.o. male who presents to the ED from home with a chief complaint of constipation.  Patient reports constipation over the past month.  Has tried several over-the-counter medications without relief of symptoms.  Had a small hard bowel movement in the ED lobby.  Denies fever, cough, chest pain, shortness of breath, abdominal pain, nausea or vomiting. + passing gas.       Past Medical History:  Diagnosis Date  . Anxiety   . Asthma   . Bradycardia   . CHF (congestive heart failure) ([place])   . Chronic Chest Pain   . COPD (chronic obstructive pulmonary disease) ([place])   . Coronary artery disease    a. 2008 s/p PCI to LAD;  b. 2013 Cath: patent stent->Med Rx;  c. 11/2013 MV: EF 67%, no ischemia/infarct.  . Depression   . Diabetes mellitus without complication ([place])   . GERD (gastroesophageal reflux disease)   . Heart murmur    a. 02/2010 Echo: EF 55-60%, no rwma, Gr 2 DD, triv MR, mildly dil LA, nl RV.  [name] Hypercholesteremia   . Hypertension   . Hypertensive heart disease   . Idiopathic pulmonary fibrosis ([place])    a. Seen by Dr. Stevenson Clinch 01/2015 and Rx Esbriet - pt cannot afford.  . Other social stressor    a. wife with mental illness    Patient Active Problem List   Diagnosis Date Noted  . Dysphagia   . Stomach irritation   . Acute on chronic respiratory failure with hypoxia ([place]) 07/22/2019  . Sepsis ([place]) 07/22/2019  . Community acquired pneumonia 07/22/2019  . COPD with acute exacerbation ([place]) 07/22/2019  . Type 2 diabetes mellitus without complication ([place]) 76/73/4193  . Neuralgia, post-herpetic 03/11/2019  . Hypertension 04/09/2018  . Pain  in limb 04/09/2018  . Chronic rhinitis 06/27/2017  . History of nasal surgery 06/27/2017  . 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 Benign localized hyperplasia of prostate with urinary obstruction 03/01/2017  . Incomplete emptying of bladder 03/01/2017  . Retention of urine, unspecified 03/01/2017  . Scrotal pain 09/11/2016  . Oropharyngeal dysphagia 09/21/2015  . Hypertensive heart disease   . Chronic Chest Pain   . Ischemic heart disease   . Hypercholesteremia   . 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 IPF (idiopathic pulmonary fibrosis) ([place]) 12/23/2014  . Pre-operative cardiovascular examination 11/05/2014  . ILD (interstitial lung disease) ([place]) 09/04/2014  . Interstitial lung disease ([place]) 06/22/2014  . Upper respiratory infection 12/03/2013  . 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 Cough 07/28/2010  . CAROTID BRUIT, LEFT 05/12/2010  . Hyperlipidemia 07/01/2009  . CAD, NATIVE VESSEL 07/01/2009  . SHORTNESS OF BREATH 07/01/2009  . Atypical chest pain 07/01/2009  . Disorder of male genital organs 07/13/2008  . Bradycardia 11/04/2007  . Family history of early CAD 01/23/2007  . Anxiety and depression 02/02/2005    Past Surgical History:  Procedure Laterality Date  . CARDIAC CATHETERIZATION    . CORONARY ANGIOPLASTY     stents times 4  . ESOPHAGOGASTRODUODENOSCOPY (EGD) WITH PROPOFOL N/A 10/22/2019   Procedure: ESOPHAGOGASTRODUODENOSCOPY (EGD) WITH PROPOFOL;  Surgeon: Virgel Manifold, MD;  Location: [facility] ENDOSCOPY;  Service:  Endoscopy;  Laterality: N/A;  . Esophagus stretch    . EYE SURGERY     Bilateral cataracts  . LUNG BIOPSY    . NASAL ENDOSCOPY N/A 12/21/2016   Procedure: NASAL ENDOSCOPY;  Surgeon: Margaretha Sheffield, MD;  Location: [facility];  Service: ENT;  Laterality: N/A;  . TESTICLE SURGERY    . VIDEO ASSISTED THORACOSCOPY (VATS)/THOROCOTOMY Right 11/18/2014   Procedure: VIDEO ASSISTED THORACOSCOPY (VATS)/ wedge resection  biopsy;  Surgeon: Nestor Lewandowsky, MD;  Location: [facility] ORS;  Service: General;  Laterality: Right;    Prior to Admission medications   Medication Sig Start Date End Date Taking? Authorizing Provider  Accu-Chek Softclix Lancets lancets USE AS DIRECTED EVERY DAY 04/12/19   [provider]  albuterol (PROVENTIL) (2.5 MG/3ML) 0.083% nebulizer solution Take 3 mLs (2.5 mg total) by nebulization every 6 (six) hours as needed. 05/02/12   Minna Merritts, MD  albuterol (VENTOLIN HFA) 108 (90 Base) MCG/ACT inhaler Inhale 2 puffs into the lungs every 4 (four) hours as needed for wheezing. 01/22/06   [provider]  amLODipine (NORVASC) 5 MG tablet Take 1 tablet (5 mg total) by mouth daily. 10/21/19   Minna Merritts, MD  aspirin EC 81 MG tablet Take 81 mg by mouth every morning. 08/06/12   [provider]  Blood Glucose Monitoring Suppl (ACCU-CHEK GUIDE ME) w/Device KIT DX E11.9 (MEDICAID PREFERRED) 04/11/19   [provider]  capsicum (ZOSTRIX) 0.075 % topical cream Apply topically. 06/15/19 06/14/20  [provider]  diclofenac Sodium (VOLTAREN) 1 % GEL  11/07/19   [provider]  gabapentin (NEURONTIN) 300 MG capsule Take 300 mg by mouth 3 (three) times daily. 10/17/19   [provider]  lactulose (CHRONULAC) 10 GM/15ML solution Take 30 mLs (20 g total) by mouth daily as needed for mild constipation. 12/28/19   Paulette Blanch, MD  metFORMIN (GLUCOPHAGE) 500 MG tablet Take 1 tablet (500 mg total) by mouth 2 (two) times daily with a meal. Patient taking differently: Take 1,000 mg by mouth 2 (two) times daily with a meal.  04/09/19 04/08/20  Earleen Newport, MD  Multiple Vitamins-Minerals (CENTRUM SILVER PO) Take by mouth daily.    [provider]  nitroGLYCERIN (NITROSTAT) 0.4 MG SL tablet Place 1 tablet (0.4 mg total) under the tongue every 5 (five) minutes as needed. 07/30/17   Minna Merritts, MD  pantoprazole (PROTONIX) 40 MG tablet  Take 1 tablet (40 mg total) by mouth daily. 11/10/19   Virgel Manifold, MD  pregabalin (LYRICA) 50 MG capsule Take 1 capsule by mouth 1 day or 1 dose. 08/13/19   [provider]  rosuvastatin (CRESTOR) 10 MG tablet TAKE 1 TABLET (10 MG TOTAL) BY MOUTH DAILY. 10/21/19   Minna Merritts, MD    Allergies Ofev [nintedanib], Metoprolol succinate, Niacin, and Sertraline hcl  Family History  Problem Relation Age of Onset  . Coronary artery disease Mother        s/p CABG  . Heart failure Mother   . Heart disease Other   . Depression Other     Social History Social History   Tobacco Use  . Smoking status: Former Smoker    Packs/day: 0.50    Years: 1.00    Pack years: 0.50    Types: Cigarettes    Quit date: 05/09/1968    Years since quitting: 51.6  . Smokeless tobacco: Never Used  Vaping Use  . Vaping Use: Never used  Substance  Use Topics  . Alcohol use: No  . Drug use: No    Review of Systems  Constitutional: No fever/chills Eyes: No visual changes. ENT: No sore throat. Cardiovascular: Denies chest pain. Respiratory: Denies shortness of breath. Gastrointestinal: No abdominal pain.  No nausea, no vomiting.  No diarrhea.  Positive for constipation. Genitourinary: Negative for dysuria. Musculoskeletal: Negative for back pain. Skin: Negative for rash. Neurological: Negative for headaches, focal weakness or numbness.   ____________________________________________   PHYSICAL EXAM:  VITAL SIGNS: ED Triage Vitals [12/28/19 0416]  Enc Vitals Group     BP 125/83     Pulse Rate 65     Resp 20     Temp 97.9 F (36.6 C)     Temp Source Oral     SpO2 98 %     Weight 154 lb (69.9 kg)     Height 5' 9"  (1.753 m)     Head Circumference      Peak Flow      Pain Score      Pain Loc      Pain Edu?      Excl. in [place]?     Constitutional: Alert and oriented. Well appearing and in no acute distress. Eyes: Conjunctivae are normal. PERRL. EOMI. Head:  Atraumatic. Nose: No congestion/rhinnorhea. Mouth/Throat: Mucous membranes are moist.  Oropharynx non-erythematous. Neck: No stridor.   Cardiovascular: Normal rate, regular rhythm. Grossly normal heart sounds.  Good peripheral circulation. Respiratory: Normal respiratory effort.  No retractions. Lungs CTAB. Gastrointestinal: Soft and nontender to light or deep palpation. No distention. No abdominal bruits. No CVA tenderness. Musculoskeletal: No lower extremity tenderness nor edema.  No joint effusions. Neurologic:  Normal speech and language. No gross focal neurologic deficits are appreciated. No gait instability. Skin:  Skin is warm, dry and intact. No rash noted. Psychiatric: Mood and affect are anxious. Speech and behavior are normal.  ____________________________________________   LABS (all labs ordered are listed, but only abnormal results are displayed)  Labs Reviewed - No data to display ____________________________________________  EKG  None ____________________________________________  RADIOLOGY  ED MD interpretation: Colonic stool burden, no SBO  Official radiology report(s): DG Abdomen 1 View  Result Date: 12/28/2019 CLINICAL DATA:  Constipation over the last month. EXAM: ABDOMEN - 1 VIEW COMPARISON:  07/25/2019 FINDINGS: Normal bowel gas pattern.  Mild colonic stool burden. No evidence of renal or ureteral stones. Soft tissues are unremarkable. No acute or significant skeletal abnormality. IMPRESSION: 1. No acute findings.  No bowel obstruction. 2. Mild colonic stool burden. Electronically Signed   By: Lajean Manes M.D.   On: 12/28/2019 05:02    ____________________________________________   PROCEDURES  Procedure(s) performed (including Critical Care):  Procedures   ____________________________________________   INITIAL IMPRESSION / ASSESSMENT AND PLAN / ED COURSE  As part of my medical decision making, I reviewed the following data within the [facility] notes reviewed and incorporated, Old chart reviewed, Radiograph reviewed and Notes from prior ED visits     Romelle Reiley. was evaluated in Emergency Department on 12/28/2019 for the symptoms described in the history of present illness. He was evaluated in the context of the global COVID-19 pandemic, which necessitated consideration that the patient might be at risk for infection with the SARS-CoV-2 virus that causes COVID-19. Institutional protocols and algorithms that pertain to the evaluation of patients at risk for COVID-19 are in a state of rapid change based on information released by regulatory bodies including the  CDC and federal and [employer]. These policies and algorithms were followed during the patient's care in the ED.    74 year old male presenting with constipation x1 month without abdominal pain, nausea or vomiting.  Offered enema which patient declines.  Will treat with lactulose and recommendations for daily bowel regimen.  Strict return precautions given.  Patient verbalizes understanding agrees with plan of care.      ____________________________________________   FINAL CLINICAL IMPRESSION(S) / ED DIAGNOSES  Final diagnoses:  Constipation, unspecified constipation type     ED Discharge Orders         Ordered    lactulose ([place]) 10 GM/15ML solution  Daily PRN     Discontinue  Reprint     12/28/19 0651           Note:  This document was prepared using Dragon voice recognition software and may include unintentional dictation errors.   Paulette Blanch, MD 12/28/19 [phone]

## 2020-02-11 ENCOUNTER — Ambulatory Visit: Payer: Medicare Other | Admitting: Gastroenterology

## 2020-02-24 ENCOUNTER — Encounter: Payer: Self-pay | Admitting: Emergency Medicine

## 2020-02-24 ENCOUNTER — Emergency Department: Payer: Medicare Other

## 2020-02-24 ENCOUNTER — Emergency Department
Admission: EM | Admit: 2020-02-24 | Discharge: 2020-02-24 | Disposition: A | Discharge status: Home / Self Care | Payer: Medicare Other | Attending: Emergency Medicine | Admitting: Emergency Medicine

## 2020-02-24 DIAGNOSIS — Z7982 Long term (current) use of aspirin: Secondary | ICD-10-CM | POA: Diagnosis not present

## 2020-02-24 DIAGNOSIS — W010XXA Fall on same level from slipping, tripping and stumbling without subsequent striking against object, initial encounter: Secondary | ICD-10-CM | POA: Diagnosis not present

## 2020-02-24 DIAGNOSIS — Y9389 Activity, other specified: Secondary | ICD-10-CM | POA: Insufficient documentation

## 2020-02-24 DIAGNOSIS — J45909 Unspecified asthma, uncomplicated: Secondary | ICD-10-CM | POA: Diagnosis not present

## 2020-02-24 DIAGNOSIS — I509 Heart failure, unspecified: Secondary | ICD-10-CM | POA: Insufficient documentation

## 2020-02-24 DIAGNOSIS — S20211A Contusion of right front wall of thorax, initial encounter: Secondary | ICD-10-CM

## 2020-02-24 DIAGNOSIS — E119 Type 2 diabetes mellitus without complications: Secondary | ICD-10-CM | POA: Insufficient documentation

## 2020-02-24 DIAGNOSIS — Y92009 Unspecified place in unspecified non-institutional (private) residence as the place of occurrence of the external cause: Secondary | ICD-10-CM | POA: Diagnosis not present

## 2020-02-24 DIAGNOSIS — I2510 Atherosclerotic heart disease of native coronary artery without angina pectoris: Secondary | ICD-10-CM | POA: Diagnosis not present

## 2020-02-24 DIAGNOSIS — Z79899 Other long term (current) drug therapy: Secondary | ICD-10-CM | POA: Diagnosis not present

## 2020-02-24 DIAGNOSIS — I110 Hypertensive heart disease with heart failure: Secondary | ICD-10-CM | POA: Diagnosis not present

## 2020-02-24 DIAGNOSIS — S299XXA Unspecified injury of thorax, initial encounter: Secondary | ICD-10-CM | POA: Diagnosis present

## 2020-02-24 DIAGNOSIS — Z87891 Personal history of nicotine dependence: Secondary | ICD-10-CM | POA: Insufficient documentation

## 2020-02-24 DIAGNOSIS — W19XXXA Unspecified fall, initial encounter: Secondary | ICD-10-CM

## 2020-02-24 MED ORDER — MELOXICAM 7.5 MG PO TABS: 7.5000 mg | ORAL_TABLET | Freq: Every day | ORAL | 0 refills | Status: AC

## 2020-02-24 MED ORDER — HYDROCODONE-ACETAMINOPHEN 5-325 MG PO TABS: 1.0000 | ORAL_TABLET | ORAL | 0 refills | Status: DC | PRN

## 2020-02-24 NOTE — ED Notes (Signed)
Pt states he had a fall today from tripping. Pt states he has pain in his right shoulder. Denies hitting head or LOC. NAD noted, ambulated to room with no difficulty.

## 2020-02-24 NOTE — ED Provider Notes (Signed)
[facility] Emergency Department Provider Note  ____________________________________________  Time seen: Approximately 10:42 PM  I have reviewed the triage vital signs and the nursing notes.   HISTORY  Chief Complaint Fall    HPI Shane Reid. is a 73 y.o. male who presents the emergency department complaining of right shoulder, right rib pain after a fall.  Patient states that he was in his house, stepped on a board in the floor that apparently was rotten causing his foot to go through the flooring.  Patient fell backwards striking his right shoulder.  He did not hit his head or lose consciousness.  This occurred yesterday.  Is patient continues to have pain he wanted to ensure that he had no fractures as he has a history of pulmonary fibrosis and does not want any complications.  No nasal congestion, sore throat.  Chronic cough but no changes.  No increased shortness of breath.  No abdominal pain.         Past Medical History:  Diagnosis Date  . Anxiety   . Asthma   . Bradycardia   . CHF (congestive heart failure) ([place])   . Chronic Chest Pain   . COPD (chronic obstructive pulmonary disease) ([place])   . Coronary artery disease    a. 2008 s/p PCI to LAD;  b. 2013 Cath: patent stent->Med Rx;  c. 11/2013 MV: EF 67%, no ischemia/infarct.  . Depression   . Diabetes mellitus without complication ([place])   . GERD (gastroesophageal reflux disease)   . Heart murmur    a. 02/2010 Echo: EF 55-60%, no rwma, Gr 2 DD, triv MR, mildly dil LA, nl RV.  [name] Hypercholesteremia   . Hypertension   . Hypertensive heart disease   . Idiopathic pulmonary fibrosis ([place])    a. Seen by Dr. Stevenson Clinch 01/2015 and Rx Esbriet - pt cannot afford.  . Other social stressor    a. wife with mental illness    Patient Active Problem List   Diagnosis Date Noted  . Dysphagia   . Stomach irritation   . Acute on chronic respiratory failure with hypoxia ([place]) 07/22/2019  . Sepsis ([place])  07/22/2019  . Community acquired pneumonia 07/22/2019  . COPD with acute exacerbation ([place]) 07/22/2019  . Type 2 diabetes mellitus without complication ([place]) 19/50/9326  . Neuralgia, post-herpetic 03/11/2019  . Hypertension 04/09/2018  . Pain in limb 04/09/2018  . Chronic rhinitis 06/27/2017  . History of nasal surgery 06/27/2017  . 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 Benign localized hyperplasia of prostate with urinary obstruction 03/01/2017  . Incomplete emptying of bladder 03/01/2017  . Retention of urine, unspecified 03/01/2017  . Scrotal pain 09/11/2016  . Oropharyngeal dysphagia 09/21/2015  . Hypertensive heart disease   . Chronic Chest Pain   . Ischemic heart disease   . Hypercholesteremia   . 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 IPF (idiopathic pulmonary fibrosis) ([place]) 12/23/2014  . Pre-operative cardiovascular examination 11/05/2014  . ILD (interstitial lung disease) ([place]) 09/04/2014  . Interstitial lung disease ([place]) 06/22/2014  . Upper respiratory infection 12/03/2013  . 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 Cough 07/28/2010  . CAROTID BRUIT, LEFT 05/12/2010  . Hyperlipidemia 07/01/2009  . CAD, NATIVE VESSEL 07/01/2009  . SHORTNESS OF BREATH 07/01/2009  . Atypical chest pain 07/01/2009  . Disorder of male genital organs 07/13/2008  . Bradycardia 11/04/2007  . Family history of early CAD 01/23/2007  . Anxiety and depression 02/02/2005    Past Surgical  History:  Procedure Laterality Date  . CARDIAC CATHETERIZATION    . CORONARY ANGIOPLASTY     stents times 4  . ESOPHAGOGASTRODUODENOSCOPY (EGD) WITH PROPOFOL N/A 10/22/2019   Procedure: ESOPHAGOGASTRODUODENOSCOPY (EGD) WITH PROPOFOL;  Surgeon: Virgel Manifold, MD;  Location: [facility] ENDOSCOPY;  Service: Endoscopy;  Laterality: N/A;  . Esophagus stretch    . EYE SURGERY     Bilateral cataracts  . LUNG BIOPSY    . NASAL ENDOSCOPY N/A 12/21/2016   Procedure: NASAL ENDOSCOPY;   Surgeon: Margaretha Sheffield, MD;  Location: [facility];  Service: ENT;  Laterality: N/A;  . TESTICLE SURGERY    . VIDEO ASSISTED THORACOSCOPY (VATS)/THOROCOTOMY Right 11/18/2014   Procedure: VIDEO ASSISTED THORACOSCOPY (VATS)/ wedge resection biopsy;  Surgeon: Nestor Lewandowsky, MD;  Location: [facility] ORS;  Service: General;  Laterality: Right;    Prior to Admission medications   Medication Sig Start Date End Date Taking? Authorizing Provider  Accu-Chek Softclix Lancets lancets USE AS DIRECTED EVERY DAY 04/12/19   [provider]  albuterol (PROVENTIL) (2.5 MG/3ML) 0.083% nebulizer solution Take 3 mLs (2.5 mg total) by nebulization every 6 (six) hours as needed. 05/02/12   Minna Merritts, MD  albuterol (VENTOLIN HFA) 108 (90 Base) MCG/ACT inhaler Inhale 2 puffs into the lungs every 4 (four) hours as needed for wheezing. 01/22/06   [provider]  amLODipine (NORVASC) 5 MG tablet Take 1 tablet (5 mg total) by mouth daily. 10/21/19   Minna Merritts, MD  aspirin EC 81 MG tablet Take 81 mg by mouth every morning. 08/06/12   [provider]  Blood Glucose Monitoring Suppl (ACCU-CHEK GUIDE ME) w/Device KIT DX E11.9 (MEDICAID PREFERRED) 04/11/19   [provider]  capsicum (ZOSTRIX) 0.075 % topical cream Apply topically. 06/15/19 06/14/20  [provider]  diclofenac Sodium (VOLTAREN) 1 % GEL  11/07/19   [provider]  gabapentin (NEURONTIN) 300 MG capsule Take 300 mg by mouth 3 (three) times daily. 10/17/19   [provider]  HYDROcodone-acetaminophen (NORCO/VICODIN) 5-325 MG tablet Take 1 tablet by mouth every 4 (four) hours as needed for moderate pain. 02/24/20   Amayrani Bennick, Charline Bills, PA-C  lactulose (CHRONULAC) 10 GM/15ML solution Take 30 mLs (20 g total) by mouth daily as needed for mild constipation. 12/28/19   Paulette Blanch, MD  meloxicam (MOBIC) 7.5 MG tablet Take 1 tablet (7.5 mg total) by mouth daily. 02/24/20 02/23/21  Katharyn Schauer, Charline Bills, PA-C  metFORMIN (GLUCOPHAGE) 500 MG tablet Take 1 tablet (500 mg total) by mouth 2 (two) times daily with a meal. Patient taking differently: Take 1,000 mg by mouth 2 (two) times daily with a meal.  04/09/19 04/08/20  Earleen Newport, MD  Multiple Vitamins-Minerals (CENTRUM SILVER PO) Take by mouth daily.    [provider]  nitroGLYCERIN (NITROSTAT) 0.4 MG SL tablet Place 1 tablet (0.4 mg total) under the tongue every 5 (five) minutes as needed. 07/30/17   Minna Merritts, MD  pantoprazole (PROTONIX) 40 MG tablet Take 1 tablet (40 mg total) by mouth daily. 11/10/19   Virgel Manifold, MD  pregabalin (LYRICA) 50 MG capsule Take 1 capsule by mouth 1 day or 1 dose. 08/13/19   [provider]  rosuvastatin (CRESTOR) 10 MG tablet TAKE 1 TABLET (10 MG TOTAL) BY MOUTH DAILY. 10/21/19   Minna Merritts, MD    Allergies Ofev [nintedanib], Metoprolol succinate, Niacin, and Sertraline hcl  Family History  Problem Relation Age of Onset  .  Coronary artery disease Mother        s/p CABG  . Heart failure Mother   . Heart disease Other   . Depression Other     Social History Social History   Tobacco Use  . Smoking status: Former Smoker    Packs/day: 0.50    Years: 1.00    Pack years: 0.50    Types: Cigarettes    Quit date: 05/09/1968    Years since quitting: 51.8  . Smokeless tobacco: Never Used  Vaping Use  . Vaping Use: Never used  Substance Use Topics  . Alcohol use: No  . Drug use: No     Review of Systems  Constitutional: No fever/chills Eyes: No visual changes. No discharge ENT: No upper respiratory complaints. Cardiovascular: no chest pain. Respiratory: no cough. No SOB. Gastrointestinal: No abdominal pain.  No nausea, no vomiting.  Musculoskeletal: Negative for musculoskeletal pain. Skin: Negative for rash, abrasions, lacerations, ecchymosis. Neurological: Negative for headaches, focal weakness or numbness. 10-point ROS otherwise  negative.  ____________________________________________   PHYSICAL EXAM:  VITAL SIGNS: ED Triage Vitals [02/24/20 2025]  Enc Vitals Group     BP (!) 131/93     Pulse Rate 67     Resp (!) 24     Temp 99 F (37.2 C)     Temp Source Oral     SpO2 97 %     Weight      Height      Head Circumference      Peak Flow      Pain Score      Pain Loc      Pain Edu?      Excl. in [place]?      Constitutional: Alert and oriented. Well appearing and in no acute distress. Eyes: Conjunctivae are normal. PERRL. EOMI. Head: Atraumatic. ENT:      Ears:       Nose: No congestion/rhinnorhea.      Mouth/Throat: Mucous membranes are moist.  Neck: No stridor.    Cardiovascular: Normal rate, regular rhythm. Normal S1 and S2.  Good peripheral circulation. Respiratory: Normal respiratory effort without tachypnea or retractions. Lungs CTAB. Good air entry to the bases with no decreased or absent breath sounds. Gastrointestinal: Bowel sounds 4 quadrants. Soft and nontender to palpation. No guarding or rigidity. No palpable masses. No distention. No CVA tenderness. Musculoskeletal: Full range of motion to all extremities. No gross deformities appreciated.  Visualization of the right ribs reveals no visible signs of trauma.  No paradoxical chest wall movement.  Patient is tender along the right lateral ribs diffusely without palpable abnormality or crepitus.  No subcutaneous emphysema.  Good underlying breath sounds bilaterally.  Examination of the right shoulder reveals no deformity.  Good range of motion.  Small abrasion noted to the medial aspect of the humerus.  No deformity.  Patient is mildly tender to palpation midshaft humerus extending into the proximal humerus.  No palpable abnormality.  Good range of motion to the elbow and wrist.  Sensation capillary refill intact distally. Neurologic:  Normal speech and language. No gross focal neurologic deficits are appreciated.  Skin:  Skin is warm, dry and  intact. No rash noted. Psychiatric: Mood and affect are normal. Speech and behavior are normal. Patient exhibits appropriate insight and judgement.   ____________________________________________   LABS (all labs ordered are listed, but only abnormal results are displayed)  Labs Reviewed - No data to display ____________________________________________  EKG   ____________________________________________  RADIOLOGY  I personally viewed and evaluated these images as part of my medical decision making, as well as reviewing the written report by the radiologist.  DG Ribs Unilateral W/Chest Right  Result Date: 02/24/2020 CLINICAL DATA:  Fall with right-sided pain EXAM: RIGHT RIBS AND CHEST - 3+ VIEW COMPARISON:  08/03/2019, CT 07/07/2019 FINDINGS: Single-view chest demonstrates bilateral pulmonary fibrosis. No acute consolidation, pleural effusion or pneumothorax. Stable cardiomediastinal silhouette. Right rib series demonstrates no acute displaced right rib fracture. IMPRESSION: Negative for acute displaced right rib fracture. Bilateral pulmonary fibrosis. Electronically Signed   By: Donavan Foil M.D.   On: 02/24/2020 21:39   DG Shoulder Right  Result Date: 02/24/2020 CLINICAL DATA:  Fall with right shoulder pain EXAM: RIGHT SHOULDER - 2+ VIEW COMPARISON:  None. FINDINGS: There is no evidence of fracture or dislocation. There is no evidence of arthropathy or other focal bone abnormality. Soft tissues are unremarkable. IMPRESSION: Negative. Electronically Signed   By: Donavan Foil M.D.   On: 02/24/2020 21:36    ____________________________________________    PROCEDURES  Procedure(s) performed:    Procedures    Medications - No data to display   ____________________________________________   INITIAL IMPRESSION / ASSESSMENT AND PLAN / ED COURSE  Pertinent labs & imaging results that were available during my care of the patient were reviewed by me and considered in my  medical decision making (see chart for details).  Review of the [place] CSRS was performed in accordance of the [place] prior to dispensing any controlled drugs.           Patient's diagnosis is consistent with fall, rib contusion.  Patient presents emergency department complaining of rib and shoulder pain after a fall.  Patient was concerned as he has pulmonary fibrosis and went to ensure that he had no rib fractures.  Imaging is reassuring with no evidence of rib fracture.  Patient will be prescribed anti-inflammatory and pain medication for symptom relief.  Follow-up primary care as needed. Patient is given ED precautions to return to the ED for any worsening or new symptoms.     ____________________________________________  FINAL CLINICAL IMPRESSION(S) / ED DIAGNOSES  Final diagnoses:  Fall, initial encounter  Contusion of rib on right side, initial encounter      NEW MEDICATIONS STARTED DURING THIS VISIT:  ED Discharge Orders         Ordered    HYDROcodone-acetaminophen (NORCO/VICODIN) 5-325 MG tablet  Every 4 hours PRN        02/24/20 2251    meloxicam (MOBIC) 7.5 MG tablet  Daily        02/24/20 2251              This chart was dictated using voice recognition software/Dragon. Despite best efforts to proofread, errors can occur which can change the meaning. Any change was purely unintentional.    Darletta Moll, PA-C 02/24/20 2253    Nena Polio, MD 02/25/20 [phone]

## 2020-02-24 NOTE — ED Triage Notes (Signed)
Pt reports he tripped today and fell onto the right shoulder and right side. Pt c/o right rib pain and right shoulder pain. No obvious deformities noted. O2 at 97% with chronic 2L.

## 2020-03-05 ENCOUNTER — Emergency Department
Admission: EM | Admit: 2020-03-05 | Discharge: 2020-03-05 | Disposition: A | Discharge status: Home / Self Care | Payer: Medicare Other | Attending: Emergency Medicine | Admitting: Emergency Medicine

## 2020-03-05 DIAGNOSIS — Z5321 Procedure and treatment not carried out due to patient leaving prior to being seen by health care provider: Secondary | ICD-10-CM | POA: Insufficient documentation

## 2020-03-05 DIAGNOSIS — R0602 Shortness of breath: Secondary | ICD-10-CM | POA: Insufficient documentation

## 2020-03-05 NOTE — ED Notes (Signed)
Pt in triage and denies symptoms but reports he came to have COVID test due to exposure in last week. Pt reports he is going to go to [facility] in the AM for testing due to no symptoms. Pt encouraged to come to ED if symptoms occur.

## 2020-03-14 ENCOUNTER — Inpatient Hospital Stay
Admission: EM | Admit: 2020-03-14 | Discharge: 2020-03-15 | DRG: 871 | Discharge status: Against Medical Advice | Payer: Medicare Other | Attending: Family Medicine | Admitting: Family Medicine

## 2020-03-14 ENCOUNTER — Emergency Department: Payer: Medicare Other

## 2020-03-14 ENCOUNTER — Other Ambulatory Visit: Payer: Self-pay

## 2020-03-14 DIAGNOSIS — J189 Pneumonia, unspecified organism: Secondary | ICD-10-CM | POA: Diagnosis present

## 2020-03-14 DIAGNOSIS — I5032 Chronic diastolic (congestive) heart failure: Secondary | ICD-10-CM | POA: Diagnosis present

## 2020-03-14 DIAGNOSIS — I10 Essential (primary) hypertension: Secondary | ICD-10-CM

## 2020-03-14 DIAGNOSIS — R600 Localized edema: Secondary | ICD-10-CM

## 2020-03-14 DIAGNOSIS — R0789 Other chest pain: Secondary | ICD-10-CM | POA: Diagnosis not present

## 2020-03-14 DIAGNOSIS — Z66 Do not resuscitate: Secondary | ICD-10-CM | POA: Diagnosis present

## 2020-03-14 DIAGNOSIS — J9601 Acute respiratory failure with hypoxia: Secondary | ICD-10-CM | POA: Diagnosis not present

## 2020-03-14 DIAGNOSIS — J440 Chronic obstructive pulmonary disease with acute lower respiratory infection: Secondary | ICD-10-CM | POA: Diagnosis present

## 2020-03-14 DIAGNOSIS — E7800 Pure hypercholesterolemia, unspecified: Secondary | ICD-10-CM | POA: Diagnosis present

## 2020-03-14 DIAGNOSIS — Z79899 Other long term (current) drug therapy: Secondary | ICD-10-CM

## 2020-03-14 DIAGNOSIS — J84112 Idiopathic pulmonary fibrosis: Secondary | ICD-10-CM | POA: Diagnosis present

## 2020-03-14 DIAGNOSIS — F32A Depression, unspecified: Secondary | ICD-10-CM | POA: Diagnosis present

## 2020-03-14 DIAGNOSIS — Z818 Family history of other mental and behavioral disorders: Secondary | ICD-10-CM | POA: Diagnosis not present

## 2020-03-14 DIAGNOSIS — Z888 Allergy status to other drugs, medicaments and biological substances status: Secondary | ICD-10-CM | POA: Diagnosis not present

## 2020-03-14 DIAGNOSIS — R0602 Shortness of breath: Secondary | ICD-10-CM

## 2020-03-14 DIAGNOSIS — A419 Sepsis, unspecified organism: Secondary | ICD-10-CM | POA: Diagnosis present

## 2020-03-14 DIAGNOSIS — Z87891 Personal history of nicotine dependence: Secondary | ICD-10-CM

## 2020-03-14 DIAGNOSIS — J441 Chronic obstructive pulmonary disease with (acute) exacerbation: Secondary | ICD-10-CM | POA: Diagnosis present

## 2020-03-14 DIAGNOSIS — Z955 Presence of coronary angioplasty implant and graft: Secondary | ICD-10-CM | POA: Diagnosis not present

## 2020-03-14 DIAGNOSIS — I110 Hypertensive heart disease with heart failure: Secondary | ICD-10-CM | POA: Diagnosis present

## 2020-03-14 DIAGNOSIS — Z5329 Procedure and treatment not carried out because of patient's decision for other reasons: Secondary | ICD-10-CM | POA: Diagnosis not present

## 2020-03-14 DIAGNOSIS — Z20822 Contact with and (suspected) exposure to covid-19: Secondary | ICD-10-CM | POA: Diagnosis present

## 2020-03-14 DIAGNOSIS — I25118 Atherosclerotic heart disease of native coronary artery with other forms of angina pectoris: Secondary | ICD-10-CM

## 2020-03-14 DIAGNOSIS — E119 Type 2 diabetes mellitus without complications: Secondary | ICD-10-CM | POA: Diagnosis present

## 2020-03-14 DIAGNOSIS — K219 Gastro-esophageal reflux disease without esophagitis: Secondary | ICD-10-CM | POA: Diagnosis present

## 2020-03-14 DIAGNOSIS — Z791 Long term (current) use of non-steroidal anti-inflammatories (NSAID): Secondary | ICD-10-CM

## 2020-03-14 DIAGNOSIS — I2510 Atherosclerotic heart disease of native coronary artery without angina pectoris: Secondary | ICD-10-CM | POA: Diagnosis present

## 2020-03-14 DIAGNOSIS — Z8249 Family history of ischemic heart disease and other diseases of the circulatory system: Secondary | ICD-10-CM | POA: Diagnosis not present

## 2020-03-14 DIAGNOSIS — E785 Hyperlipidemia, unspecified: Secondary | ICD-10-CM | POA: Diagnosis present

## 2020-03-14 DIAGNOSIS — J9621 Acute and chronic respiratory failure with hypoxia: Secondary | ICD-10-CM | POA: Diagnosis present

## 2020-03-14 DIAGNOSIS — Z7982 Long term (current) use of aspirin: Secondary | ICD-10-CM

## 2020-03-14 DIAGNOSIS — R6520 Severe sepsis without septic shock: Secondary | ICD-10-CM | POA: Diagnosis present

## 2020-03-14 DIAGNOSIS — D649 Anemia, unspecified: Secondary | ICD-10-CM | POA: Diagnosis present

## 2020-03-14 LAB — CBC
HCT: 37.4 % — ABNORMAL LOW (ref 39.0–52.0)
Hemoglobin: 12.5 g/dL — ABNORMAL LOW (ref 13.0–17.0)
MCH: 27.9 pg (ref 26.0–34.0)
MCHC: 33.4 g/dL (ref 30.0–36.0)
MCV: 83.5 fL (ref 80.0–100.0)
Platelets: 260 10*3/uL (ref 150–400)
RBC: 4.48 MIL/uL (ref 4.22–5.81)
RDW: 13.7 % (ref 11.5–15.5)
WBC: 11.7 10*3/uL — ABNORMAL HIGH (ref 4.0–10.5)
nRBC: 0 % (ref 0.0–0.2)

## 2020-03-14 LAB — RESPIRATORY PANEL BY RT PCR (FLU A&B, COVID)
Influenza A by PCR: NEGATIVE
Influenza B by PCR: NEGATIVE
SARS Coronavirus 2 by RT PCR: NEGATIVE

## 2020-03-14 LAB — BASIC METABOLIC PANEL
Anion gap: 11 (ref 5–15)
BUN: 15 mg/dL (ref 8–23)
CO2: 24 mmol/L (ref 22–32)
Calcium: 9.3 mg/dL (ref 8.9–10.3)
Chloride: 104 mmol/L (ref 98–111)
Creatinine, Ser: 1.05 mg/dL (ref 0.61–1.24)
GFR, Estimated: >60 mL/min (ref >60)
Glucose, Bld: 148 mg/dL — ABNORMAL HIGH (ref 70–99)
Potassium: 3.9 mmol/L (ref 3.5–5.1)
Sodium: 139 mmol/L (ref 135–145)

## 2020-03-14 LAB — BRAIN NATRIURETIC PEPTIDE: B Natriuretic Peptide: 71.2 pg/mL (ref 0.0–100.0)

## 2020-03-14 LAB — TROPONIN I (HIGH SENSITIVITY): Troponin I (High Sensitivity): 11 ng/L (ref <18)

## 2020-03-14 MED ORDER — SODIUM CHLORIDE 0.9 % IV SOLN: 2.0000 g | INTRAVENOUS | Status: DC

## 2020-03-14 MED ORDER — INSULIN ASPART 100 UNIT/ML ~~LOC~~ SOLN: 0.0000 [IU] | SUBCUTANEOUS | Status: DC

## 2020-03-14 MED ORDER — SODIUM CHLORIDE 0.9 % IV SOLN: 500.0000 mg | INTRAVENOUS | Status: DC

## 2020-03-14 MED ORDER — SODIUM CHLORIDE 0.9 % IV SOLN
1.0000 g | Freq: Once | INTRAVENOUS | Status: AC
  Administered 2020-03-15: 1 g via INTRAVENOUS
  Filled 2020-03-14: qty 10

## 2020-03-14 MED ORDER — IPRATROPIUM-ALBUTEROL 0.5-2.5 (3) MG/3ML IN SOLN
3.0000 mL | Freq: Once | RESPIRATORY_TRACT | Status: AC
  Administered 2020-03-14: 3 mL via RESPIRATORY_TRACT
  Filled 2020-03-14: qty 3

## 2020-03-14 MED ORDER — SODIUM CHLORIDE 0.9 % IV SOLN
500.0000 mg | Freq: Once | INTRAVENOUS | Status: AC
  Administered 2020-03-15: 500 mg via INTRAVENOUS
  Filled 2020-03-14: qty 500

## 2020-03-14 NOTE — ED Triage Notes (Addendum)
Patient POV to ED for SOB. Patient reports oxygen saturation in the 80s at home with ambulation. Patient pale/dusky color on arrival; patient placed in wheel chair. Patient's oxygen saturation currently 95% on 2L while resting.   Patient's respirations worsening during triage. Patient now only able to get out 2-3 word sentences. Patient's oxygen saturation 85% on 2L. Patient's oxygen increased to 4L via face mask.   This RN informed first nurse. Charge RN informed.

## 2020-03-14 NOTE — H&P (Signed)
Shane Reid. OHF:[id number] DOB: 1945/07/23 DOA: 03/14/2020     PCP: Elba Barman, MD   Outpatient Specialists:   CARDS:   Dr. Saundra Shelling   Pulmonary Dr. Inda Coke   At [facility]   Patient arrived to ER on 03/14/20 at 2146 Referred by Attending Arta Silence, MD   Patient coming from: home Lives  With family    Chief Complaint:   Chief Complaint  Patient presents with  . Shortness of Breath    HPI: Shane Reid. is a 74 y.o. male with medical history significant of pulmonary fibrosis, CHF chronic diastolic, COPD, CAD, depression, DM2, GERD, HLD, HTN    Presented with significant shortness of breath has been having low saturations over past few days with O2 readings in the 80s.  He has been having somewhat worsening cough for past few days no fever.  Occasional chest pain associated with cough He is on oxygen as needed but not on a regular basis, he tried to increase his HOme o2 to 3L  Reports left leg swelling for the past 2 m now throbing  o2 down when he does any activities.  Infectious risk factors:  Reports  shortness of breath, dry cough, chest pain,      Has  been vaccinated against COVID    Initial COVID TEST  NEGATIVE   Lab Results  Component Value Date   [place] 03/14/2020   [place] NEGATIVE 10/20/2019   [place] NEGATIVE 07/22/2019   [place] NEGATIVE 03/05/2019     Regarding pertinent Chronic problems:      Hyperlipidemia -  on statins Crestor Lipid Panel     Component Value Date/Time   CHOL 94 (L) 10/02/2017 0813   TRIG 81 10/02/2017 0813   TRIG 137 07/20/2008 0000   HDL 34 (L) 10/02/2017 0813   CHOLHDL 2.8 10/02/2017 0813   CHOLHDL 2.7 Ratio 05/12/2010 2241   VLDL 28 05/12/2010 2241   LDLCALC 44 10/02/2017 0813   LABVLDL 16 10/02/2017 0813     HTN on Norvasc   chronic CHF diastolic - last echo 2080 during an echogram preserved EF and normal diastolic function prior echogram showed grade 2  diastolic dysfunction   CAD  - On Aspirin, statin, betablocker, Plavix                 -  followed by cardiology Status post 4 stents   DM 2 -  Lab Results  Component Value Date   HGBA1C 6.4 (H) 10/02/2017   on  PO meds only,     Pulmonary fibrosis   Followed by pulmonology          Chronic anemia - baseline hg Hemoglobin & Hematocrit  Recent Labs    07/24/19 2235 08/03/19 1957 03/14/20 2223  HGB 12.6* 13.1 12.5*     While in ER: Chest x-ray showed pneumonia     Hospitalist was called for admission for CAP  The following Work up has been ordered so far:  Orders Placed This Encounter  Procedures  . Respiratory Panel by RT PCR (Flu A&B, Covid) - Nasopharyngeal Swab  . DG Chest Port 1 View  . Basic metabolic panel  . CBC  . Brain natriuretic peptide  . Document Height and Actual Weight  . Cardiac monitoring  . Consult to hospitalist  5901  . Pulse oximetry, continuous  . ED EKG  . EKG 12-Lead  . ED EKG     Following Medications were ordered  in ER: Medications  cefTRIAXone (ROCEPHIN) 1 g in sodium chloride 0.9 % 100 mL IVPB (has no administration in time range)  azithromycin (ZITHROMAX) 500 mg in sodium chloride 0.9 % 250 mL IVPB (has no administration in time range)  ipratropium-albuterol (DUONEB) 0.5-2.5 (3) MG/3ML nebulizer solution 3 mL (3 mLs Nebulization Given 03/14/20 2234)        Consult Orders  (From admission, onward)         Start     Ordered   03/14/20 2319  Consult to hospitalist  5901  Once       Comments: [id number]  Provider:  (Not yet assigned)  Question Answer Comment  Place call to: Hospitalist   Reason for Consult Admit   Diagnosis/Clinical Info for Consult: Pneumonia      03/14/20 2318          Significant initial  Findings: Abnormal Labs Reviewed  CBC - Abnormal; Notable for the following components:      Result Value   WBC 11.7 (*)    Hemoglobin 12.5 (*)    HCT 37.4 (*)    All other components within normal limits     Otherwise labs showing:    No results for input(s): NA, K, CO2, GLUCOSE, BUN, CREATININE, CALCIUM, MG, PHOS in the last 168 hours.  Cr  * stable,  Up from baseline see below Lab Results  Component Value Date   CREATININE 0.90 08/03/2019   CREATININE 0.95 07/24/2019   CREATININE 1.11 07/22/2019    No results for input(s): AST, ALT, ALKPHOS, BILITOT, PROT, ALBUMIN in the last 168 hours. Lab Results  Component Value Date   CALCIUM 9.2 08/03/2019      WBC      Component Value Date/Time   WBC 11.7 (H) 03/14/2020 2223   LYMPHSABS 2.3 08/03/2019 1957   LYMPHSABS 1.9 03/24/2014 0000   MONOABS 0.7 08/03/2019 1957   MONOABS 1.3 (H) 03/24/2014 0000   EOSABS 0.2 08/03/2019 1957   EOSABS 0.2 03/24/2014 0000   BASOSABS 0.1 08/03/2019 1957   BASOSABS 0.0 03/24/2014 0000   Plt: Lab Results  Component Value Date   PLT 260 03/14/2020     Lactic Acid, Venous    Component Value Date/Time   LATICACIDVEN 3.1 (HH) 03/15/2020 0032     Procalcitonin <0.1   COVID-19 Labs  No results for input(s): DDIMER, FERRITIN, LDH, CRP in the last 72 hours.  Lab Results  Component Value Date   SARSCOV2NAA NEGATIVE 10/20/2019   SARSCOV2NAA NEGATIVE 07/22/2019   SARSCOV2NAA NEGATIVE 03/05/2019       Venous  Blood Gas result:  PH 7.41 pCO2  44       HG/HCT   Stable     Component Value Date/Time   HGB 12.5 (L) 03/14/2020 2223   HGB 13.8 03/24/2014 0000   HCT 37.4 (L) 03/14/2020 2223   HCT 42.6 03/24/2014 0000   MCV 83.5 03/14/2020 2223   MCV 88 03/24/2014 0000     Troponin 11   ECG: Ordered Personally reviewed by me showing: HR : 98 Rhythm:  NSR,  nonspecific changes  QTC 426   BNP (last 3 results) Recent Labs    07/07/19 2124 03/14/20 2223  BNP 35.0 71.2      CBG (last 3)  Recent Labs    03/15/20 0038  GLUCAP 105*       UA  not ordered   Urine analysis:    Component Value Date/Time   COLORURINE STRAW (A) 04/09/2019 0912  APPEARANCEUR CLEAR (A)  04/09/2019 0912   APPEARANCEUR Hazy 12/01/2013 2024   LABSPEC 1.025 04/09/2019 0912   LABSPEC 1.025 12/01/2013 2024   PHURINE 5.0 04/09/2019 0912   GLUCOSEU >=500 (A) 04/09/2019 0912   GLUCOSEU Negative 12/01/2013 2024   HGBUR SMALL (A) 04/09/2019 0912   BILIRUBINUR NEGATIVE 04/09/2019 0912   BILIRUBINUR Negative 12/01/2013 2024   KETONESUR 5 (A) 04/09/2019 0912   PROTEINUR NEGATIVE 04/09/2019 0912   NITRITE NEGATIVE 04/09/2019 0912   LEUKOCYTESUR NEGATIVE 04/09/2019 0912   LEUKOCYTESUR Negative 12/01/2013 2024      Ordered   CXR - CAP       ED Triage Vitals  Enc Vitals Group     BP 03/14/20 2152 (!) 161/68     Pulse Rate 03/14/20 2152 88     Resp 03/14/20 2152 (!) 29     Temp 03/14/20 2154 98.3 F (36.8 C)     Temp Source 03/14/20 2154 Oral     SpO2 03/14/20 2152 95 %     Weight 03/14/20 2154 154 lb 1.6 oz (69.9 kg)     Height 03/14/20 2154 5' 9"  (1.753 m)     Head Circumference --      Peak Flow --      Pain Score 03/14/20 2153 8     Pain Loc --      Pain Edu? --      Excl. in [place]? --   TMAX(24)@       Latest  Blood pressure (!) 161/68, pulse 88, temperature 98.3 F (36.8 C), temperature source Oral, resp. rate (!) 29, height 5' 9"  (1.753 m), weight 69.9 kg, SpO2 95 %.    Review of Systems:    Pertinent positives include:   Fatigue shortness of breath at rest.   dyspnea on exertion productive cough, Constitutional:  No weight loss, night sweats, Fevers, chills,, weight loss  HEENT:  No headaches, Difficulty swallowing,Tooth/dental problems,Sore throat,  No sneezing, itching, ear ache, nasal congestion, post nasal drip,  Cardio-vascular:  No chest pain, Orthopnea, PND, anasarca, dizziness, palpitations.no Bilateral lower extremity swelling  GI:  No heartburn, indigestion, abdominal pain, nausea, vomiting, diarrhea, change in bowel habits, loss of appetite, melena, blood in stool, hematemesis Resp:  no , No excess mucus, no  No non-productive cough, No coughing  up of blood.No change in color of mucus.No wheezing. Skin:  no rash or lesions. No jaundice GU:  no dysuria, change in color of urine, no urgency or frequency. No straining to urinate.  No flank pain.  Musculoskeletal:  No joint pain or no joint swelling. No decreased range of motion. No back pain.  Psych:  No change in mood or affect. No depression or anxiety. No memory loss.  Neuro: no localizing neurological complaints, no tingling, no weakness, no double vision, no gait abnormality, no slurred speech, no confusion  All systems reviewed and apart from [place] all are negative  Past Medical History:   Past Medical History:  Diagnosis Date  . Anxiety   . Asthma   . Bradycardia   . CHF (congestive heart failure) ([place])   . Chronic Chest Pain   . COPD (chronic obstructive pulmonary disease) ([place])   . Coronary artery disease    a. 2008 s/p PCI to LAD;  b. 2013 Cath: patent stent->Med Rx;  c. 11/2013 MV: EF 67%, no ischemia/infarct.  . Depression   . Diabetes mellitus without complication ([place])   . GERD (gastroesophageal reflux disease)   . Heart murmur  a. 02/2010 Echo: EF 55-60%, no rwma, Gr 2 DD, triv MR, mildly dil LA, nl RV.  [name] Hypercholesteremia   . Hypertension   . Hypertensive heart disease   . Idiopathic pulmonary fibrosis ([place])    a. Seen by Dr. Stevenson Clinch 01/2015 and Rx Esbriet - pt cannot afford.  . Other social stressor    a. wife with mental illness     Past Surgical History:  Procedure Laterality Date  . CARDIAC CATHETERIZATION    . CORONARY ANGIOPLASTY     stents times 4  . ESOPHAGOGASTRODUODENOSCOPY (EGD) WITH PROPOFOL N/A 10/22/2019   Procedure: ESOPHAGOGASTRODUODENOSCOPY (EGD) WITH PROPOFOL;  Surgeon: Virgel Manifold, MD;  Location: [facility];  Service: Endoscopy;  Laterality: N/A;  . Esophagus stretch    . EYE SURGERY     Bilateral cataracts  . LUNG BIOPSY    . NASAL ENDOSCOPY N/A 12/21/2016   Procedure: NASAL ENDOSCOPY;  Surgeon: Margaretha Sheffield,  MD;  Location: [place];  Service: ENT;  Laterality: N/A;  . TESTICLE SURGERY    . VIDEO ASSISTED THORACOSCOPY (VATS)/THOROCOTOMY Right 11/18/2014   Procedure: VIDEO ASSISTED THORACOSCOPY (VATS)/ wedge resection biopsy;  Surgeon: Nestor Lewandowsky, MD;  Location: [facility] ORS;  Service: General;  Laterality: Right;    Social History:  Ambulatory   Independently      reports that he quit smoking about 51 years ago. His smoking use included cigarettes. He has a 0.50 pack-year smoking history. He has never used smokeless tobacco. He reports that he does not drink alcohol and does not use drugs.   Family History:   Family History  Problem Relation Age of Onset  . Coronary artery disease Mother        s/p CABG  . Heart failure Mother   . Heart disease Other   . Depression Other     Allergies: Allergies  Allergen Reactions  . Ofev [Nintedanib]   . Metoprolol Succinate Other (See Comments)    Reaction:  Unknown   . Niacin Other (See Comments)    Reaction:  Unknown   . Sertraline Hcl Other (See Comments)    Reaction:  Unknown      Prior to Admission medications   Medication Sig Start Date End Date Taking? Authorizing Provider  Accu-Chek Softclix Lancets lancets USE AS DIRECTED EVERY DAY 04/12/19   [provider]  albuterol (PROVENTIL) (2.5 MG/3ML) 0.083% nebulizer solution Take 3 mLs (2.5 mg total) by nebulization every 6 (six) hours as needed. 05/02/12   Minna Merritts, MD  albuterol (VENTOLIN HFA) 108 (90 Base) MCG/ACT inhaler Inhale 2 puffs into the lungs every 4 (four) hours as needed for wheezing. 01/22/06   [provider]  amLODipine (NORVASC) 5 MG tablet Take 1 tablet (5 mg total) by mouth daily. 10/21/19   Minna Merritts, MD  aspirin EC 81 MG tablet Take 81 mg by mouth every morning. 08/06/12   [provider]  Blood Glucose Monitoring Suppl (ACCU-CHEK GUIDE ME) w/Device KIT DX E11.9 (MEDICAID PREFERRED) 04/11/19   [provider]   capsicum (ZOSTRIX) 0.075 % topical cream Apply topically. 06/15/19 06/14/20  [provider]  diclofenac Sodium (VOLTAREN) 1 % GEL  11/07/19   [provider]  gabapentin (NEURONTIN) 300 MG capsule Take 300 mg by mouth 3 (three) times daily. 10/17/19   [provider]  HYDROcodone-acetaminophen (NORCO/VICODIN) 5-325 MG tablet Take 1 tablet by mouth every 4 (four) hours as needed for moderate pain. 02/24/20   [name], Charline Bills,  PA-C  lactulose (CHRONULAC) 10 GM/15ML solution Take 30 mLs (20 g total) by mouth daily as needed for mild constipation. 12/28/19   Paulette Blanch, MD  meloxicam (MOBIC) 7.5 MG tablet Take 1 tablet (7.5 mg total) by mouth daily. 02/24/20 02/23/21  [name], Charline Bills, PA-C  metFORMIN (GLUCOPHAGE) 500 MG tablet Take 1 tablet (500 mg total) by mouth 2 (two) times daily with a meal. Patient taking differently: Take 1,000 mg by mouth 2 (two) times daily with a meal.  04/09/19 04/08/20  Earleen Newport, MD  Multiple Vitamins-Minerals (CENTRUM SILVER PO) Take by mouth daily.    [provider]  nitroGLYCERIN (NITROSTAT) 0.4 MG SL tablet Place 1 tablet (0.4 mg total) under the tongue every 5 (five) minutes as needed. 07/30/17   Minna Merritts, MD  pantoprazole (PROTONIX) 40 MG tablet Take 1 tablet (40 mg total) by mouth daily. 11/10/19   Virgel Manifold, MD  pregabalin (LYRICA) 50 MG capsule Take 1 capsule by mouth 1 day or 1 dose. 08/13/19   [provider]  rosuvastatin (CRESTOR) 10 MG tablet TAKE 1 TABLET (10 MG TOTAL) BY MOUTH DAILY. 10/21/19   Minna Merritts, MD   Physical Exam: Vitals with BMI 03/14/2020 03/05/2020 02/24/2020  Height 5' 9"  - -  Weight 154 lbs 2 oz - -  BMI 65.78 - -  Systolic 469 629 528  Diastolic 68 86 64  Pulse 88 87 64   1. General:  in No Acute distress   Chronically ill  -appearing 2. Psychological: Alert and  Oriented 3. Head/ENT:     Dry Mucous Membranes                          Head Non  traumatic, neck supple                           Poor Dentition 4. SKIN:   decreased Skin turgor,  Skin clean Dry and intact no rash 5. Heart: Regular rate and rhythm no  Murmur, no Rub or gallop 6. Lungs:  no wheezes fine crackles   7. Abdomen: Soft non-tender, Non distended bowel sounds present 8. Lower extremities: no clubbing, cyanosis, L>Redema 9. Neurologically Grossly intact, moving all 4 extremities equally   10. MSK: Normal range of motion   All other LABS:     Recent Labs  Lab 03/14/20 2223  WBC 11.7*  HGB 12.5*  HCT 37.4*  MCV 83.5  PLT 260     No results for input(s): NA, K, CL, CO2, GLUCOSE, BUN, CREATININE, CALCIUM, MG, PHOS in the last 168 hours.   No results for input(s): AST, ALT, ALKPHOS, BILITOT, PROT, ALBUMIN in the last 168 hours.     Cultures:    Component Value Date/Time   SDES BLOOD LEFT ARM 07/22/2019 0305   SPECREQUEST  07/22/2019 0305    BOTTLES DRAWN AEROBIC AND ANAEROBIC Blood Culture adequate volume   CULT  07/22/2019 0305    NO GROWTH 5 DAYS Performed at [facility], [address] Madelaine Bhat [place], [place] [address]    REPTSTATUS 07/27/2019 FINAL 07/22/2019 0305     Radiological Exams on Admission: DG Chest Port 1 View  Result Date: 03/14/2020 CLINICAL DATA:  Chest pain EXAM: PORTABLE CHEST 1 VIEW COMPARISON:  02/24/2020 FINDINGS: Heart is normal size. Extensive bilateral airspace opacities. Background of chronic interstitial lung disease changes. Airspace opacities increased since prior study.  No effusions or pneumothorax. IMPRESSION: Chronic interstitial lung disease with concern for superimposed airspace opacities concerning for pneumonia. Electronically Signed   By: Rolm Baptise M.D.   On: 03/14/2020 22:32    Chart has been reviewed   Assessment/Plan  74 y.o. male with medical history significant of pulmonary fibrosis, CHF chronic diastolic, COPD, CAD, depression, DM2, GERD, HLD, HTN  Admitted for CAP  Present on  Admission: . Sepsis ([place]) -  -SIRS criteria met with  elevated white blood cell count,       Component Value Date/Time   WBC 11.7 (H) 03/14/2020 2223   LYMPHSABS 2.3 08/03/2019 1957   LYMPHSABS 1.9 03/24/2014 0000      RR >20 Today's Vitals   03/14/20 2152 03/14/20 2153 03/14/20 2154 03/15/20 0047  BP: (!) 161/68   (!) 147/79  Pulse: 88   79  Resp: (!) 29   (!) 26  Temp:   98.3 F (36.8 C)   TempSrc:   Oral   SpO2: 95%   98%  Weight:   69.9 kg   Height:   5' 9"  (1.753 m)   PainSc:  8          -Most likely source being:   pulmonary,    Patient meeting criteria for Severe sepsis with   Acute hypoxia requiring new supplemental oxygen, SpO2: 95 % O2 Flow Rate (L/min): 2 L/min     - Obtain serial lactic acid and procalcitonin level. Elevated lactic acid will rehydrate and follow  - Initiated IV antibiotics   - await results of blood and urine culture  - Rehydrate        . 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 Hypertension - allow permissive HTN for tonight  . Hypercholesteremia - chronic stable  . Community acquired pneumonia -  - will admit for treatment of CAP will start on appropriate antibiotic coverage.   Obtain:  sputum cultures,                  Obtain respiratory panel and influenza serologies                          blood cultures if febrile or if decompensates.                   strep pneumo UA antigen,                                Provide oxygen as needed.   [name] COPD with acute exacerbation ([place]) -  Will initiate: Steroid taper Avoid high dose steroids as pt had severe hyperglycemia in the past Also possible underlining infection   -  Antibiotics azithromycine - Albuterol  PRN, - scheduled duoneb,  -  Breo or Dulera at discharge   -  Mucinex.  Titrate O2 to saturation >90%. Follow patients respiratory status.   VBG showing no CO2 retention    Currently mentating well no evidence of symptomatic hypercarbia  . CAD, NATIVE VESSEL  -  - chronic, continue aspirin    . Atypical chest pain -Cycle CE, repeat ECG  . Acute on chronic respiratory failure with hypoxia (HCC) - likley multifactorial combination of COPD exacerbation interstitial pulmonary fibrosis and possible pneumonia   Left leg edema will check Dopplers   Other plan as per orders.  DVT prophylaxis:   Lovenox  Code Status:    Code Status: Prior  limited code DNI   as per patient   I had personally discussed CODE STATUS with patient      Family Communication:   Family not at  Bedside    Disposition Plan:    To home once workup is complete and patient is stable   Following barriers for discharge:                             Hypoxia sabalized                                white count improving able to transition to PO antibiotics                                                                             Would benefit from PT/OT eval prior to DC  Ordered                                       Consults called: none    Admission status:  ED Disposition    ED Disposition Condition [facility]: [place] [[id number]]  Level of Care: Med-Surg [[id number]]  Covid Evaluation: Confirmed COVID Negative  Diagnosis: CAP (community acquired pneumonia) [875643]  Admitting Physician: Toy Baker [[id number]]  Attending Physician: Toy Baker [[id number]]  Estimated length of stay: past midnight tomorrow  Certification:: I certify this patient will need inpatient services for at least 2 midnights           inpatient     I Expect 2 midnight stay secondary to severity of patient's current illness need for inpatient interventions justified by the following:  hemodynamic instability despite optimal treatment (tachycardia   tachypnea  hypoxia,  )   Severe lab/radiological/exam abnormalities including:    CAP and extensive comorbidities including:    DM2      CAD  COPD/asthma .    That are currently  affecting medical management.   I expect  patient to be hospitalized for 2 midnights requiring inpatient medical care.  Patient is at high risk for adverse outcome (such as loss of life or disability) if not treated.  Indication for inpatient stay as follows:    New or worsening hypoxia  Need for IV antibiotics, IV fluids     Level of care     tele  For  24H      Lab Results  Component Value Date   [place] NEGATIVE 03/14/2020     Precautions: admitted as  Covid Negative      PPE: Used by the provider:   P100  eye Goggles,  Gloves    Yoshimi Sarr 03/15/2020, 1:21 AM    [facility]     after 2 AM please page floor coverage PA If 7AM-7PM, please contact the day team taking care of the patient using [email or web address]   Patient was evaluated in the context of the global COVID-19 pandemic, which necessitated consideration that  the patient might be at risk for infection with the SARS-CoV-2 virus that causes COVID-19. Institutional protocols and algorithms that pertain to the evaluation of patients at risk for COVID-19 are in a state of rapid change based on information released by regulatory bodies including the CDC and federal and state organizations. These policies and algorithms were followed during the patient's care.

## 2020-03-14 NOTE — ED Provider Notes (Signed)
[facility] Emergency Department Provider Note ____________________________________________   First MD Initiated Contact with Patient 03/14/20 2208     (approximate)  I have reviewed the triage vital signs and the nursing notes.   HISTORY  Chief Complaint Shortness of Breath    HPI Shane Reid. is a 74 y.o. male with PMH as noted below including pulmonary fibrosis, CHF, and COPD, on home O2 intermittently as needed who presents with increased shortness of breath and low O2 saturations over the last few days, with his O2 reading as low as the mid 80s.  The patient reports a chronic cough which may be somewhat worse over the last few days.  He has not had any fevers.  He does have chest pain associated with the cough.  Past Medical History:  Diagnosis Date  . Anxiety   . Asthma   . Bradycardia   . CHF (congestive heart failure) ([place])   . Chronic Chest Pain   . COPD (chronic obstructive pulmonary disease) ([place])   . Coronary artery disease    a. 2008 s/p PCI to LAD;  b. 2013 Cath: patent stent->Med Rx;  c. 11/2013 MV: EF 67%, no ischemia/infarct.  . Depression   . Diabetes mellitus without complication ([place])   . GERD (gastroesophageal reflux disease)   . Heart murmur    a. 02/2010 Echo: EF 55-60%, no rwma, Gr 2 DD, triv MR, mildly dil LA, nl RV.  [name] Hypercholesteremia   . Hypertension   . Hypertensive heart disease   . Idiopathic pulmonary fibrosis ([place])    a. Seen by Dr. Stevenson Clinch 01/2015 and Rx Esbriet - pt cannot afford.  . Other social stressor    a. wife with mental illness    Patient Active Problem List   Diagnosis Date Noted  . Dysphagia   . Stomach irritation   . Acute on chronic respiratory failure with hypoxia ([place]) 07/22/2019  . Sepsis ([place]) 07/22/2019  . Community acquired pneumonia 07/22/2019  . COPD with acute exacerbation ([place]) 07/22/2019  . Type 2 diabetes mellitus without complication ([place]) 11/19/7626  . Neuralgia,  post-herpetic 03/11/2019  . Hypertension 04/09/2018  . Pain in limb 04/09/2018  . Chronic rhinitis 06/27/2017  . History of nasal surgery 06/27/2017  . 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 Benign localized hyperplasia of prostate with urinary obstruction 03/01/2017  . Incomplete emptying of bladder 03/01/2017  . Retention of urine, unspecified 03/01/2017  . Scrotal pain 09/11/2016  . Oropharyngeal dysphagia 09/21/2015  . Hypertensive heart disease   . Chronic Chest Pain   . Ischemic heart disease   . Hypercholesteremia   . 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 IPF (idiopathic pulmonary fibrosis) ([place]) 12/23/2014  . Pre-operative cardiovascular examination 11/05/2014  . ILD (interstitial lung disease) ([place]) 09/04/2014  . Interstitial lung disease ([place]) 06/22/2014  . Upper respiratory infection 12/03/2013  . 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 Cough 07/28/2010  . CAROTID BRUIT, LEFT 05/12/2010  . Hyperlipidemia 07/01/2009  . CAD, NATIVE VESSEL 07/01/2009  . SHORTNESS OF BREATH 07/01/2009  . Atypical chest pain 07/01/2009  . Disorder of male genital organs 07/13/2008  . Bradycardia 11/04/2007  . Family history of early CAD 01/23/2007  . Anxiety and depression 02/02/2005    Past Surgical History:  Procedure Laterality Date  . CARDIAC CATHETERIZATION    . CORONARY ANGIOPLASTY     stents times 4  . ESOPHAGOGASTRODUODENOSCOPY (EGD) WITH PROPOFOL N/A 10/22/2019   Procedure: ESOPHAGOGASTRODUODENOSCOPY (EGD) WITH  PROPOFOL;  Surgeon: Virgel Manifold, MD;  Location: [facility] ENDOSCOPY;  Service: Endoscopy;  Laterality: N/A;  . Esophagus stretch    . EYE SURGERY     Bilateral cataracts  . LUNG BIOPSY    . NASAL ENDOSCOPY N/A 12/21/2016   Procedure: NASAL ENDOSCOPY;  Surgeon: Margaretha Sheffield, MD;  Location: [facility];  Service: ENT;  Laterality: N/A;  . TESTICLE SURGERY    . VIDEO ASSISTED THORACOSCOPY (VATS)/THOROCOTOMY Right 11/18/2014    Procedure: VIDEO ASSISTED THORACOSCOPY (VATS)/ wedge resection biopsy;  Surgeon: Nestor Lewandowsky, MD;  Location: [facility] ORS;  Service: General;  Laterality: Right;    Prior to Admission medications   Medication Sig Start Date End Date Taking? Authorizing Provider  Accu-Chek Softclix Lancets lancets USE AS DIRECTED EVERY DAY 04/12/19   [provider]  albuterol (PROVENTIL) (2.5 MG/3ML) 0.083% nebulizer solution Take 3 mLs (2.5 mg total) by nebulization every 6 (six) hours as needed. 05/02/12   Minna Merritts, MD  albuterol (VENTOLIN HFA) 108 (90 Base) MCG/ACT inhaler Inhale 2 puffs into the lungs every 4 (four) hours as needed for wheezing. 01/22/06   [provider]  amLODipine (NORVASC) 5 MG tablet Take 1 tablet (5 mg total) by mouth daily. 10/21/19   Minna Merritts, MD  aspirin EC 81 MG tablet Take 81 mg by mouth every morning. 08/06/12   [provider]  Blood Glucose Monitoring Suppl (ACCU-CHEK GUIDE ME) w/Device KIT DX E11.9 (MEDICAID PREFERRED) 04/11/19   [provider]  capsicum (ZOSTRIX) 0.075 % topical cream Apply topically. 06/15/19 06/14/20  [provider]  diclofenac Sodium (VOLTAREN) 1 % GEL  11/07/19   [provider]  gabapentin (NEURONTIN) 300 MG capsule Take 300 mg by mouth 3 (three) times daily. 10/17/19   [provider]  HYDROcodone-acetaminophen (NORCO/VICODIN) 5-325 MG tablet Take 1 tablet by mouth every 4 (four) hours as needed for moderate pain. 02/24/20   [name], Charline Bills, PA-C  lactulose (CHRONULAC) 10 GM/15ML solution Take 30 mLs (20 g total) by mouth daily as needed for mild constipation. 12/28/19   Paulette Blanch, MD  meloxicam (MOBIC) 7.5 MG tablet Take 1 tablet (7.5 mg total) by mouth daily. 02/24/20 02/23/21  [name], Charline Bills, PA-C  metFORMIN (GLUCOPHAGE) 500 MG tablet Take 1 tablet (500 mg total) by mouth 2 (two) times daily with a meal. Patient taking differently: Take 1,000 mg by mouth 2 (two) times  daily with a meal.  04/09/19 04/08/20  Earleen Newport, MD  Multiple Vitamins-Minerals (CENTRUM SILVER PO) Take by mouth daily.    [provider]  nitroGLYCERIN (NITROSTAT) 0.4 MG SL tablet Place 1 tablet (0.4 mg total) under the tongue every 5 (five) minutes as needed. 07/30/17   Minna Merritts, MD  pantoprazole (PROTONIX) 40 MG tablet Take 1 tablet (40 mg total) by mouth daily. 11/10/19   Virgel Manifold, MD  pregabalin (LYRICA) 50 MG capsule Take 1 capsule by mouth 1 day or 1 dose. 08/13/19   [provider]  rosuvastatin (CRESTOR) 10 MG tablet TAKE 1 TABLET (10 MG TOTAL) BY MOUTH DAILY. 10/21/19   Minna Merritts, MD    Allergies Ofev [nintedanib], Metoprolol succinate, Niacin, and Sertraline hcl  Family History  Problem Relation Age of Onset  . Coronary artery disease Mother        s/p CABG  . Heart failure Mother   . Heart disease Other   . Depression Other     Social History Social  History   Tobacco Use  . Smoking status: Former Smoker    Packs/day: 0.50    Years: 1.00    Pack years: 0.50    Types: Cigarettes    Quit date: 05/09/1968    Years since quitting: 51.8  . Smokeless tobacco: Never Used  Vaping Use  . Vaping Use: Never used  Substance Use Topics  . Alcohol use: No  . Drug use: No    Review of Systems  Constitutional: No fever/chills. Eyes: No redness. ENT: No sore throat. Cardiovascular: Positive for chest pain. Respiratory: Positive for shortness of breath. Gastrointestinal: No vomiting or diarrhea.  Genitourinary: Negative for flank pain.  Musculoskeletal: Negative for back pain. Skin: Negative for rash. Neurological: Negative for headache.   ____________________________________________   PHYSICAL EXAM:  VITAL SIGNS: ED Triage Vitals  Enc Vitals Group     BP 03/14/20 2152 (!) 161/68     Pulse Rate 03/14/20 2152 88     Resp 03/14/20 2152 (!) 29     Temp 03/14/20 2154 98.3 F (36.8 C)     Temp Source  03/14/20 2154 Oral     SpO2 03/14/20 2152 95 %     Weight 03/14/20 2154 154 lb 1.6 oz (69.9 kg)     Height 03/14/20 2154 _0  (1.753 m)     Head Circumference --      Peak Flow --      Pain Score 03/14/20 2153 8     Pain Loc --      Pain Edu? --      Excl. in [place]? --     Constitutional: Alert and oriented.  Relatively well appearing. Eyes: Conjunctivae are normal.  Head: Atraumatic. Nose: No congestion/rhinnorhea. Mouth/Throat: Mucous membranes are moist.   Neck: Normal range of motion.  Cardiovascular: Normal rate, regular rhythm. Grossly normal heart sounds.  Good peripheral circulation. Respiratory: Increased respiratory effort with no acute distress.  Faint rales bilaterally slightly worse on the right. Gastrointestinal: Soft and nontender. No distention.  Genitourinary: No flank tenderness. Musculoskeletal: Mild left lower extremity edema which the patient states is chronic.  Extremities warm and well perfused.  Neurologic:  Normal speech and language. No gross focal neurologic deficits are appreciated.  Skin:  Skin is warm and dry. No rash noted. Psychiatric: Mood and affect are normal. Speech and behavior are normal.  ____________________________________________   LABS (all labs ordered are listed, but only abnormal results are displayed)  Labs Reviewed  CBC - Abnormal; Notable for the following components:      Result Value   WBC 11.7 (*)    Hemoglobin 12.5 (*)    HCT 37.4 (*)    All other components within normal limits  RESPIRATORY PANEL BY RT PCR (FLU A&B, COVID)  BRAIN NATRIURETIC PEPTIDE  BASIC METABOLIC PANEL  BLOOD GAS, VENOUS  PROCALCITONIN  TROPONIN I (HIGH SENSITIVITY)  TROPONIN I (HIGH SENSITIVITY)   ____________________________________________  EKG  ED ECG REPORT I, Arta Silence, the attending physician, personally viewed and interpreted this ECG.  Date: 03/14/2020 EKG Time: 2151 Rate: 98 Rhythm: normal sinus rhythm QRS Axis:  normal Intervals: normal ST/T Wave abnormalities: nonspecific ST abnormalities Narrative Interpretation: Nonspecific abnormalities with no evidence of acute ischemia  ____________________________________________  RADIOLOGY  CXR interpreted by me shows chronic interstitial findings with some superimposed opacity concerning for pneumonia  ____________________________________________   PROCEDURES  Procedure(s) performed: No  Procedures  Critical Care performed: Yes  CRITICAL CARE Performed by: Arta Silence   Total critical  care time: 15 minutes  Critical care time was exclusive of separately billable procedures and treating other patients.  Critical care was necessary to treat or prevent imminent or life-threatening deterioration.  Critical care was time spent personally by me on the following activities: development of treatment plan with patient and/or surrogate as well as nursing, discussions with consultants, evaluation of patient's response to treatment, examination of patient, obtaining history from patient or surrogate, ordering and performing treatments and interventions, ordering and review of laboratory studies, ordering and review of radiographic studies, pulse oximetry and re-evaluation of patient's condition. ____________________________________________   INITIAL IMPRESSION / ASSESSMENT AND PLAN / ED COURSE  Pertinent labs & imaging results that were available during my care of the patient were reviewed by me and considered in my medical decision making (see chart for details).  74 year old male with PMH as noted above including pulmonary fibrosis, CHF, and COPD presents with increased shortness of breath, hypoxia, and cough over the last several days.  The patient is a somewhat disorganized historian but reports that he uses 3 L of O2 intermittently when needed.  Even on oxygen at home, he had O2 saturations in the mid 80s with exertion.  He states that this  has not happened before.  I reviewed the past medical records in [place].  The patient has had numerous prior ED visits for various complaints.  He has not been admitted in the last several years.  On exam, he has increased work of breathing.  Apparently in triage she had mild cyanosis.  His color is good at this time.  He is able to speak in full sentences.  He has some faint rales bilaterally.  The remainder of the exam is unremarkable except for mild asymmetric lower extremity edema, worse on the left, which the patient states is chronic.  Differential includes exacerbation of his pulmonary fibrosis, COPD, and/or CHF, versus possible COVID-19 or bacterial pneumonia.  We will obtain a chest x-ray, lab work-up, give a DuoNeb and reassess.  ----------------------------------------- 11:26 PM on 03/14/2020 -----------------------------------------  Chest x-ray shows possible superimposed opacities consistent with pneumonia.  The patient continues to be somewhat tachypneic with increased work of breathing but appears more comfortable.  O2 saturation is in the mid 90s on 2 L at this time.  I have ordered antibiotics for CAP and will plan to admit.  ----------------------------------------- 11:39 PM on 03/14/2020 -----------------------------------------  I discussed the patient with Dr. Roel Cluck from the hospitalist service.  ____________________________________________   FINAL CLINICAL IMPRESSION(S) / ED DIAGNOSES  Final diagnoses:  Community acquired pneumonia, unspecified laterality      NEW MEDICATIONS STARTED DURING THIS VISIT:  New Prescriptions   No medications on file     Note:  This document was prepared using Dragon voice recognition software and may include unintentional dictation errors.   Arta Silence, MD 03/14/20 [phone]

## 2020-03-15 ENCOUNTER — Inpatient Hospital Stay: Payer: Medicare Other

## 2020-03-15 DIAGNOSIS — J189 Pneumonia, unspecified organism: Secondary | ICD-10-CM | POA: Diagnosis not present

## 2020-03-15 DIAGNOSIS — R0789 Other chest pain: Secondary | ICD-10-CM | POA: Diagnosis not present

## 2020-03-15 DIAGNOSIS — J9621 Acute and chronic respiratory failure with hypoxia: Secondary | ICD-10-CM | POA: Diagnosis not present

## 2020-03-15 DIAGNOSIS — J84112 Idiopathic pulmonary fibrosis: Secondary | ICD-10-CM

## 2020-03-15 DIAGNOSIS — J441 Chronic obstructive pulmonary disease with (acute) exacerbation: Secondary | ICD-10-CM | POA: Diagnosis not present

## 2020-03-15 LAB — HEPATIC FUNCTION PANEL
ALT: 14 U/L (ref 0–44)
AST: 26 U/L (ref 15–41)
Albumin: 3.7 g/dL (ref 3.5–5.0)
Alkaline Phosphatase: 60 U/L (ref 38–126)
Bilirubin, Direct: <0.1 mg/dL (ref 0.0–0.2)
Total Bilirubin: 0.7 mg/dL (ref 0.3–1.2)
Total Protein: 7.6 g/dL (ref 6.5–8.1)

## 2020-03-15 LAB — CBC WITH DIFFERENTIAL/PLATELET
Abs Immature Granulocytes: 0.03 10*3/uL (ref 0.00–0.07)
Abs Immature Granulocytes: 0.04 10*3/uL (ref 0.00–0.07)
Basophils Absolute: 0.1 10*3/uL (ref 0.0–0.1)
Basophils Absolute: 0.1 10*3/uL (ref 0.0–0.1)
Basophils Relative: 1 %
Basophils Relative: 1 %
Eosinophils Absolute: 0.3 10*3/uL (ref 0.0–0.5)
Eosinophils Absolute: 0.3 10*3/uL (ref 0.0–0.5)
Eosinophils Relative: 2 %
Eosinophils Relative: 3 %
HCT: 38.3 % — ABNORMAL LOW (ref 39.0–52.0)
HCT: 35.9 % — ABNORMAL LOW (ref 39.0–52.0)
Hemoglobin: 12.7 g/dL — ABNORMAL LOW (ref 13.0–17.0)
Hemoglobin: 11.8 g/dL — ABNORMAL LOW (ref 13.0–17.0)
Immature Granulocytes: 0 %
Immature Granulocytes: 0 %
Lymphocytes Relative: 20 %
Lymphocytes Relative: 23 %
Lymphs Abs: 2.5 10*3/uL (ref 0.7–4.0)
Lymphs Abs: 2.6 10*3/uL (ref 0.7–4.0)
MCH: 27.9 pg (ref 26.0–34.0)
MCH: 28 pg (ref 26.0–34.0)
MCHC: 33.2 g/dL (ref 30.0–36.0)
MCHC: 32.9 g/dL (ref 30.0–36.0)
MCV: 84 fL (ref 80.0–100.0)
MCV: 85.1 fL (ref 80.0–100.0)
Monocytes Absolute: 1.2 10*3/uL — ABNORMAL HIGH (ref 0.1–1.0)
Monocytes Absolute: 1.2 10*3/uL — ABNORMAL HIGH (ref 0.1–1.0)
Monocytes Relative: 10 %
Monocytes Relative: 10 %
Neutro Abs: 8.3 10*3/uL — ABNORMAL HIGH (ref 1.7–7.7)
Neutro Abs: 7.1 10*3/uL (ref 1.7–7.7)
Neutrophils Relative %: 67 %
Neutrophils Relative %: 63 %
Platelets: 273 10*3/uL (ref 150–400)
Platelets: 223 10*3/uL (ref 150–400)
RBC: 4.56 MIL/uL (ref 4.22–5.81)
RBC: 4.22 MIL/uL (ref 4.22–5.81)
RDW: 13.9 % (ref 11.5–15.5)
RDW: 13.9 % (ref 11.5–15.5)
WBC: 12.4 10*3/uL — ABNORMAL HIGH (ref 4.0–10.5)
WBC: 11.3 10*3/uL — ABNORMAL HIGH (ref 4.0–10.5)
nRBC: 0 % (ref 0.0–0.2)
nRBC: 0 % (ref 0.0–0.2)

## 2020-03-15 LAB — COMPREHENSIVE METABOLIC PANEL
ALT: 12 U/L (ref 0–44)
AST: 23 U/L (ref 15–41)
Albumin: 3.4 g/dL — ABNORMAL LOW (ref 3.5–5.0)
Alkaline Phosphatase: 53 U/L (ref 38–126)
Anion gap: 8 (ref 5–15)
BUN: 11 mg/dL (ref 8–23)
CO2: 26 mmol/L (ref 22–32)
Calcium: 8.2 mg/dL — ABNORMAL LOW (ref 8.9–10.3)
Chloride: 106 mmol/L (ref 98–111)
Creatinine, Ser: 0.84 mg/dL (ref 0.61–1.24)
GFR, Estimated: >60 mL/min (ref >60)
Glucose, Bld: 126 mg/dL — ABNORMAL HIGH (ref 70–99)
Potassium: 3.8 mmol/L (ref 3.5–5.1)
Sodium: 140 mmol/L (ref 135–145)
Total Bilirubin: 0.7 mg/dL (ref 0.3–1.2)
Total Protein: 6.8 g/dL (ref 6.5–8.1)

## 2020-03-15 LAB — TROPONIN I (HIGH SENSITIVITY)
Troponin I (High Sensitivity): 12 ng/L (ref <18)
Troponin I (High Sensitivity): 8 ng/L (ref <18)

## 2020-03-15 LAB — TSH: TSH: 2.131 u[IU]/mL (ref 0.350–4.500)

## 2020-03-15 LAB — GLUCOSE, CAPILLARY
Glucose-Capillary: 105 mg/dL — ABNORMAL HIGH (ref 70–99)
Glucose-Capillary: 120 mg/dL — ABNORMAL HIGH (ref 70–99)

## 2020-03-15 LAB — LACTIC ACID, PLASMA
Lactic Acid, Venous: 3.1 mmol/L (ref 0.5–1.9)
Lactic Acid, Venous: 2.1 mmol/L (ref 0.5–1.9)

## 2020-03-15 LAB — MAGNESIUM: Magnesium: 1.7 mg/dL (ref 1.7–2.4)

## 2020-03-15 LAB — PHOSPHORUS: Phosphorus: 3.1 mg/dL (ref 2.5–4.6)

## 2020-03-15 LAB — PROCALCITONIN: Procalcitonin: <0.1 ng/mL

## 2020-03-15 LAB — FIBRIN DERIVATIVES D-DIMER (ARMC ONLY): Fibrin derivatives D-dimer (ARMC): 500.86 ng/mL (FEU) — ABNORMAL HIGH (ref 0.00–499.00)

## 2020-03-15 LAB — STREP PNEUMONIAE URINARY ANTIGEN: Strep Pneumo Urinary Antigen: NEGATIVE

## 2020-03-15 MED ORDER — HYDROCODONE-ACETAMINOPHEN 5-325 MG PO TABS: 1.0000 | ORAL_TABLET | ORAL | Status: DC | PRN

## 2020-03-15 MED ORDER — ALBUTEROL SULFATE (2.5 MG/3ML) 0.083% IN NEBU: 2.5000 mg | INHALATION_SOLUTION | RESPIRATORY_TRACT | Status: DC | PRN

## 2020-03-15 MED ORDER — SODIUM CHLORIDE 0.9 % IV BOLUS
500.0000 mL | Freq: Once | INTRAVENOUS | Status: AC
  Administered 2020-03-15: 500 mL via INTRAVENOUS

## 2020-03-15 MED ORDER — GABAPENTIN 300 MG PO CAPS: 300.0000 mg | ORAL_CAPSULE | Freq: Three times a day (TID) | ORAL | Status: DC

## 2020-03-15 MED ORDER — LACTULOSE 10 GM/15ML PO SOLN: 20.0000 g | Freq: Every day | ORAL | Status: DC | PRN

## 2020-03-15 MED ORDER — MOMETASONE FURO-FORMOTEROL FUM 200-5 MCG/ACT IN AERO
1.0000 | INHALATION_SPRAY | Freq: Two times a day (BID) | RESPIRATORY_TRACT | Status: DC
  Filled 2020-03-15: qty 8.8

## 2020-03-15 MED ORDER — PREDNISONE 20 MG PO TABS
20.0000 mg | ORAL_TABLET | Freq: Every day | ORAL | 0 refills | Status: DC
Start: 2020-03-15 — End: 2023-03-12

## 2020-03-15 MED ORDER — AZITHROMYCIN 250 MG PO TABS: ORAL_TABLET | ORAL | 0 refills | Status: AC

## 2020-03-15 MED ORDER — CEFDINIR 300 MG PO CAPS: 300.0000 mg | ORAL_CAPSULE | Freq: Two times a day (BID) | ORAL | 0 refills | Status: DC

## 2020-03-15 MED ORDER — PREGABALIN 50 MG PO CAPS
50.0000 mg | ORAL_CAPSULE | ORAL | Status: DC
  Administered 2020-03-15: 50 mg via ORAL
  Filled 2020-03-15: qty 1

## 2020-03-15 MED ORDER — PANTOPRAZOLE SODIUM 40 MG PO TBEC: 40.0000 mg | DELAYED_RELEASE_TABLET | Freq: Every day | ORAL | Status: DC

## 2020-03-15 MED ORDER — ENOXAPARIN SODIUM 40 MG/0.4ML ~~LOC~~ SOLN: 40.0000 mg | SUBCUTANEOUS | Status: DC

## 2020-03-15 MED ORDER — ACETAMINOPHEN 325 MG PO TABS: 650.0000 mg | ORAL_TABLET | Freq: Four times a day (QID) | ORAL | Status: DC | PRN

## 2020-03-15 MED ORDER — ACETAMINOPHEN 650 MG RE SUPP: 650.0000 mg | Freq: Four times a day (QID) | RECTAL | Status: DC | PRN

## 2020-03-15 MED ORDER — ROSUVASTATIN CALCIUM 10 MG PO TABS
10.0000 mg | ORAL_TABLET | Freq: Every day | ORAL | Status: DC
  Filled 2020-03-15: qty 1

## 2020-03-15 MED ORDER — DM-GUAIFENESIN ER 30-600 MG PO TB12
1.0000 | ORAL_TABLET | Freq: Two times a day (BID) | ORAL | Status: DC
  Administered 2020-03-15: 1 via ORAL
  Filled 2020-03-15: qty 1

## 2020-03-15 MED ORDER — PREDNISONE 20 MG PO TABS: 40.0000 mg | ORAL_TABLET | Freq: Every day | ORAL | Status: DC

## 2020-03-15 MED ORDER — ASPIRIN EC 81 MG PO TBEC: 81.0000 mg | DELAYED_RELEASE_TABLET | ORAL | Status: DC

## 2020-03-15 MED ORDER — BUDESONIDE 0.5 MG/2ML IN SUSP
2.0000 mg | Freq: Four times a day (QID) | RESPIRATORY_TRACT | Status: DC
  Administered 2020-03-15: 2 mg via RESPIRATORY_TRACT
  Filled 2020-03-15: qty 8

## 2020-03-15 MED ORDER — IPRATROPIUM-ALBUTEROL 0.5-2.5 (3) MG/3ML IN SOLN
3.0000 mL | Freq: Four times a day (QID) | RESPIRATORY_TRACT | Status: DC
  Administered 2020-03-15: 3 mL via RESPIRATORY_TRACT
  Filled 2020-03-15: qty 3

## 2020-03-15 MED ORDER — SODIUM CHLORIDE 0.9 % IV SOLN
75.0000 mL/h | INTRAVENOUS | Status: DC
  Administered 2020-03-15: 75 mL/h via INTRAVENOUS

## 2020-03-15 NOTE — ED Notes (Signed)
Pt left AMA before discharge vital signs completed. Pt in NAD at time of departure.

## 2020-03-15 NOTE — ED Notes (Signed)
MD Doutova aware of pt's lactic acid result and orders given.

## 2020-03-15 NOTE — Discharge Summary (Addendum)
Physician Discharge Summary  Shane Reid. JOI:[id number] DOB: 04/07/1946 DOA: 03/14/2020  PCP: Elba Barman, MD  Admit date: 03/14/2020 Discharge date: 03/15/2020  Admitted From: Home Disposition: Left AMA   Recommendations for Outpatient Follow-up:  1. Follow up with pulmonologist as soon as possible  Equipment/Devices: Continue 2L O2 Discharge Condition: Improved, not completely stabilized CODE STATUS: Partial (DNI) per admitting MD  Brief/Interim Summary: Shane Reid. is a 74 y.o. male with a history of chronic hypoxic respiratory failure on 2-3L O2, pulmonary fibrosis, CHF chronic diastolic, COPD, CAD, depression, DM2, GERD, HLD, HTN who presented to the ED 10/17 with increased shortness of breath and low O2 saturations over the last few days, with his O2 reading as low as the mid 80s. The patient reports a chronic cough which may be somewhat worse over the last few days. He has not had any fevers.  He does have chest pain associated with the cough. He was speaking full sentences with rales bilaterally with L > R LE edema which was reported to be chronic. Work up in the ED included Chest x-ray shows possible superimposed opacities consistent with pneumonia.  The patient continued to be somewhat tachypneic with increased work of breathing but appears more comfortable.  O2 saturation is in the mid 90s on 2 L. Hospitalists called to admit for CAP, antibiotics started. He only stayed briefly overnight and stated he was leaving the following morning. He has oxygen already arranged at home chronically and is not hypoxic on 2L O2 still in the ED. He has normal work of breathing. He was implored to remain inpatient for monitoring given his baseline poor pulmonary function, he insists on leaving to care for his wife at home. He confirms understanding of risks of leaving currently against medical advice and voices understanding of return precautions. He has reliable pulmonary follow  up. I also spoke with his wife by phone who also states he needs to leave this morning. Prescriptions for antibiotics and steroids are sent to his pharmacy prior to him leaving this morning again medical advice.    Discharge Diagnoses:  Active Problems:   CAD, NATIVE VESSEL   Atypical chest pain   IPF (idiopathic pulmonary fibrosis) (HCC)   Hypercholesteremia   Hypertension   Acute on chronic respiratory failure with hypoxia (HCC)   Sepsis ([place])   Community acquired pneumonia   COPD with acute exacerbation ([place])   Type 2 diabetes mellitus without complication ([place])   CAP (community acquired pneumonia)  Discharge Instructions Discharge Instructions    Discharge instructions   Complete by: As directed    You are choosing to leave the hospital earlier than recommended. It is important that you return to the ER immediately if your chest pain returns, your shortness of breath returns, or you develop a fever or worsened cough. Otherwise, follow up with your pulmonologist as soon as possible. You have an appointment next week.   - Continue using oxygen at home at 2-3L. - Prescriptions for antibiotics (azithromycin for 5 days and cefdinir twice daily for 10 days) and low dose of steroids (prednisone) were sent to your pharmacy. Please take these as directed.     Allergies as of 03/15/2020      Reactions   Ofev [nintedanib]    Metoprolol Succinate Other (See Comments)   Reaction:  Unknown    Niacin Other (See Comments)   Reaction:  Unknown    Sertraline Hcl Other (See Comments)   Reaction:  Unknown  Medication List    STOP taking these medications   doxycycline 100 MG capsule Commonly known as: MONODOX     TAKE these medications   Accu-Chek Guide Me w/Device Kit DX E11.9 (MEDICAID PREFERRED)   Accu-Chek Softclix Lancets lancets USE AS DIRECTED EVERY DAY   Ventolin HFA 108 (90 Base) MCG/ACT inhaler Generic drug: albuterol Inhale 2 puffs into the lungs every 4 (four)  hours as needed for wheezing.   albuterol (2.5 MG/3ML) 0.083% nebulizer solution Commonly known as: PROVENTIL Take 3 mLs (2.5 mg total) by nebulization every 6 (six) hours as needed.   amLODipine 5 MG tablet Commonly known as: NORVASC Take 1 tablet (5 mg total) by mouth daily.   aspirin EC 81 MG tablet Take 81 mg by mouth every morning.   azithromycin 250 MG tablet Commonly known as: Zithromax Z-Pak Take 2 tablets (500 mg) on  Day 1,  followed by 1 tablet (250 mg) once daily on Days 2 through 5.   benzonatate 100 MG capsule Commonly known as: TESSALON Take 100 mg by mouth 3 (three) times daily.   cefdinir 300 MG capsule Commonly known as: OMNICEF Take 1 capsule (300 mg total) by mouth 2 (two) times daily.   CENTRUM SILVER PO Take 1 tablet by mouth daily.   diclofenac Sodium 1 % Gel Commonly known as: VOLTAREN Apply 1 application topically 4 (four) times daily.   HYDROcodone-acetaminophen 5-325 MG tablet Commonly known as: NORCO/VICODIN Take 1 tablet by mouth every 4 (four) hours as needed for moderate pain.   lactulose 10 GM/15ML solution Commonly known as: CHRONULAC Take 30 mLs (20 g total) by mouth daily as needed for mild constipation.   meloxicam 7.5 MG tablet Commonly known as: Mobic Take 1 tablet (7.5 mg total) by mouth daily.   metFORMIN 1000 MG tablet Commonly known as: GLUCOPHAGE Take 1,000 mg by mouth 2 (two) times daily with a meal.   nitroGLYCERIN 0.4 MG SL tablet Commonly known as: NITROSTAT Place 1 tablet (0.4 mg total) under the tongue every 5 (five) minutes as needed.   pantoprazole 40 MG tablet Commonly known as: PROTONIX Take 1 tablet (40 mg total) by mouth daily.   predniSONE 20 MG tablet Commonly known as: DELTASONE Take 1 tablet (20 mg total) by mouth daily with breakfast.   pregabalin 50 MG capsule Commonly known as: LYRICA Take 100 mg by mouth 2 (two) times daily.   rosuvastatin 10 MG tablet Commonly known as: CRESTOR TAKE 1  TABLET (10 MG TOTAL) BY MOUTH DAILY. What changed:   how much to take  how to take this  when to take this       Allergies  Allergen Reactions  . Ofev [Nintedanib]   . Metoprolol Succinate Other (See Comments)    Reaction:  Unknown   . Niacin Other (See Comments)    Reaction:  Unknown   . Sertraline Hcl Other (See Comments)    Reaction:  Unknown     Consultations:  None  Procedures/Studies: DG Ribs Unilateral W/Chest Right  Result Date: 02/24/2020 CLINICAL DATA:  Fall with right-sided pain EXAM: RIGHT RIBS AND CHEST - 3+ VIEW COMPARISON:  08/03/2019, CT 07/07/2019 FINDINGS: Single-view chest demonstrates bilateral pulmonary fibrosis. No acute consolidation, pleural effusion or pneumothorax. Stable cardiomediastinal silhouette. Right rib series demonstrates no acute displaced right rib fracture. IMPRESSION: Negative for acute displaced right rib fracture. Bilateral pulmonary fibrosis. Electronically Signed   By: Donavan Foil M.D.   On: 02/24/2020 21:39   DG Shoulder  Right  Result Date: 02/24/2020 CLINICAL DATA:  Fall with right shoulder pain EXAM: RIGHT SHOULDER - 2+ VIEW COMPARISON:  None. FINDINGS: There is no evidence of fracture or dislocation. There is no evidence of arthropathy or other focal bone abnormality. Soft tissues are unremarkable. IMPRESSION: Negative. Electronically Signed   By: Donavan Foil M.D.   On: 02/24/2020 21:36   US Venous Img Lower Unilateral Left (DVT)  Result Date: 03/15/2020 CLINICAL DATA:  Left leg edema for a couple of months. Pain and edema. Anticoagulation therapy with aspirin. Former tobacco user. EXAM: Left LOWER EXTREMITY VENOUS DOPPLER ULTRASOUND TECHNIQUE: Gray-scale sonography with compression, as well as color and duplex ultrasound, were performed to evaluate the deep venous system(s) from the level of the common femoral vein through the popliteal and proximal calf veins. COMPARISON:  None. FINDINGS: VENOUS Normal compressibility of the  common femoral, superficial femoral, and popliteal veins, as well as the visualized calf veins. Visualized portions of profunda femoral vein and great saphenous vein unremarkable. No filling defects to suggest DVT on grayscale or color Doppler imaging. Doppler waveforms show normal direction of venous flow, normal respiratory plasticity and response to augmentation. Limited views of the contralateral common femoral vein are unremarkable. OTHER None. Limitations: none IMPRESSION: No evidence of significant deep venous thrombosis in the visualized lower extremity veins. Electronically Signed   By: Lucienne Capers M.D.   On: 03/15/2020 01:19   DG Chest Port 1 View  Result Date: 03/14/2020 CLINICAL DATA:  Chest pain EXAM: PORTABLE CHEST 1 VIEW COMPARISON:  02/24/2020 FINDINGS: Heart is normal size. Extensive bilateral airspace opacities. Background of chronic interstitial lung disease changes. Airspace opacities increased since prior study. No effusions or pneumothorax. IMPRESSION: Chronic interstitial lung disease with concern for superimposed airspace opacities concerning for pneumonia. Electronically Signed   By: Rolm Baptise M.D.   On: 03/14/2020 22:32      Subjective: Feels much better, no dyspnea, chest pain, palpitations, or other complaints. He is leaving this morning with or without medical staff endorsement.   Discharge Exam: Vitals:   03/15/20 0522 03/15/20 0634  BP: 120/79 120/79  Pulse: 63 66  Resp: (!) 26 (!) 23  Temp:    SpO2: 99% 99%   General: Pt is alert, awake, not in acute distress Cardiovascular: RRR, S1/S2 +, no rubs, no gallops Respiratory: Nonlabored, tachypneic while speaking full sentences, crackles bilaterally Abdominal: Soft, NT, ND, bowel sounds + Extremities: Mild LE edema, no cyanosis. LE venous U/S showed no DVT.  Labs: BNP (last 3 results) Recent Labs    07/07/19 2124 03/14/20 2223  BNP 35.0 03.2   Basic Metabolic Panel: Recent Labs  Lab  03/14/20 2223 03/15/20 0450  NA 139 140  K 3.9 3.8  CL 104 106  CO2 24 26  GLUCOSE 148* 126*  BUN 15 11  CREATININE 1.05 0.84  CALCIUM 9.3 8.2*  MG  --  1.7  PHOS  --  3.1   Liver Function Tests: Recent Labs  Lab 03/15/20 0032 03/15/20 0450  AST 26 23  ALT 14 12  ALKPHOS 60 53  BILITOT 0.7 0.7  PROT 7.6 6.8  ALBUMIN 3.7 3.4*   No results for input(s): LIPASE, AMYLASE in the last 168 hours. No results for input(s): AMMONIA in the last 168 hours. CBC: Recent Labs  Lab 03/14/20 2223 03/15/20 0032 03/15/20 0329  WBC 11.7* 12.4* 11.3*  NEUTROABS  --  8.3* 7.1  HGB 12.5* 12.7* 11.8*  HCT 37.4* 38.3* 35.9*  MCV  83.5 84.0 85.1  PLT 260 273 223   Cardiac Enzymes: No results for input(s): CKTOTAL, CKMB, CKMBINDEX, TROPONINI in the last 168 hours. BNP: Invalid input(s): POCBNP CBG: Recent Labs  Lab 03/15/20 0038 03/15/20 0336  GLUCAP 105* 120*   D-Dimer No results for input(s): DDIMER in the last 72 hours. Hgb A1c No results for input(s): HGBA1C in the last 72 hours. Lipid Profile No results for input(s): CHOL, HDL, LDLCALC, TRIG, CHOLHDL, LDLDIRECT in the last 72 hours. Thyroid function studies Recent Labs    03/15/20 0450  TSH 2.131   Anemia work up No results for input(s): VITAMINB12, FOLATE, FERRITIN, TIBC, IRON, RETICCTPCT in the last 72 hours. Urinalysis    Component Value Date/Time   COLORURINE STRAW (A) 04/09/2019 0912   APPEARANCEUR CLEAR (A) 04/09/2019 0912   APPEARANCEUR Hazy 12/01/2013 2024   LABSPEC 1.025 04/09/2019 0912   LABSPEC 1.025 12/01/2013 2024   PHURINE 5.0 04/09/2019 0912   GLUCOSEU >=500 (A) 04/09/2019 0912   GLUCOSEU Negative 12/01/2013 2024   HGBUR SMALL (A) 04/09/2019 0912   BILIRUBINUR NEGATIVE 04/09/2019 0912   BILIRUBINUR Negative 12/01/2013 2024   KETONESUR 5 (A) 04/09/2019 0912   PROTEINUR NEGATIVE 04/09/2019 0912   NITRITE NEGATIVE 04/09/2019 0912   LEUKOCYTESUR NEGATIVE 04/09/2019 0912   LEUKOCYTESUR Negative  12/01/2013 2024    Microbiology Recent Results (from the past 240 hour(s))  Respiratory Panel by RT PCR (Flu A&B, Covid) - Nasopharyngeal Swab     Status: None   Collection Time: 03/14/20 10:23 PM   Specimen: Nasopharyngeal Swab  Result Value Ref Range Status   SARS Coronavirus 2 by RT PCR NEGATIVE NEGATIVE Final    Comment: (NOTE) SARS-CoV-2 target nucleic acids are NOT DETECTED.  The SARS-CoV-2 RNA is generally detectable in upper respiratoy specimens during the acute phase of infection. The lowest concentration of SARS-CoV-2 viral copies this assay can detect is 131 copies/mL. A negative result does not preclude SARS-Cov-2 infection and should not be used as the sole basis for treatment or other patient management decisions. A negative result may occur with  improper specimen collection/handling, submission of specimen other than nasopharyngeal swab, presence of viral mutation(s) within the areas targeted by this assay, and inadequate number of viral copies (<131 copies/mL). A negative result must be combined with clinical observations, patient history, and epidemiological information. The expected result is Negative.  Fact Sheet for Patients:  PinkCheek.be  Fact Sheet for Healthcare Providers:  GravelBags.it  This test is no t yet approved or cleared by the [place] FDA and  has been authorized for detection and/or diagnosis of SARS-CoV-2 by FDA under an Emergency Use Authorization (EUA). This EUA will remain  in effect (meaning this test can be used) for the duration of the COVID-19 declaration under Section 564(b)(1) of the Act, 21 U.S.C. section 360bbb-3(b)(1), unless the authorization is terminated or revoked sooner.     Influenza A by PCR NEGATIVE NEGATIVE Final   Influenza B by PCR NEGATIVE NEGATIVE Final    Comment: (NOTE) The Xpert Xpress SARS-CoV-2/FLU/RSV assay is intended as an aid in  the  diagnosis of influenza from Nasopharyngeal swab specimens and  should not be used as a sole basis for treatment. Nasal washings and  aspirates are unacceptable for Xpert Xpress SARS-CoV-2/FLU/RSV  testing.  Fact Sheet for Patients: PinkCheek.be  Fact Sheet for Healthcare Providers: GravelBags.it  This test is not yet approved or cleared by the [place] FDA and  has been authorized for detection and/or diagnosis  of SARS-CoV-2 by  FDA under an Emergency Use Authorization (EUA). This EUA will remain  in effect (meaning this test can be used) for the duration of the  Covid-19 declaration under Section 564(b)(1) of the Act, 21  U.S.C. section 360bbb-3(b)(1), unless the authorization is  terminated or revoked. Performed at [facility], [address] [place], [place] [address]   Culture, blood (x 2)     Status: None (Preliminary result)   Collection Time: 03/15/20 12:33 AM   Specimen: BLOOD  Result Value Ref Range Status   Specimen Description BLOOD LEFT ANTECUBITAL  Final   Special Requests   Final    BOTTLES DRAWN AEROBIC AND ANAEROBIC Blood Culture adequate volume   Culture   Final    NO GROWTH < 12 HOURS Performed at [facility], [address] [place], [place] [address]    Report Status PENDING  Incomplete  Culture, blood (x 2)     Status: None (Preliminary result)   Collection Time: 03/15/20 12:33 AM   Specimen: BLOOD  Result Value Ref Range Status   Specimen Description BLOOD BLOOD LEFT FOREARM  Final   Special Requests   Final    BOTTLES DRAWN AEROBIC AND ANAEROBIC Blood Culture adequate volume   Culture   Final    NO GROWTH < 12 HOURS Performed at [facility], [address], [place], [place] [address]    Report Status PENDING  Incomplete    Time coordinating discharge: Approximately 40 minutes  Patrecia Pour, MD  [facility] 03/15/2020, 5:31 PM

## 2020-03-15 NOTE — ED Notes (Signed)
Repeat light green sent down to lab per request.

## 2020-03-16 LAB — LEGIONELLA PNEUMOPHILA SEROGP 1 UR AG: L. pneumophila Serogp 1 Ur Ag: NEGATIVE

## 2020-03-17 LAB — BLOOD GAS, VENOUS
Acid-Base Excess: 2.7 mmol/L — ABNORMAL HIGH (ref 0.0–2.0)
Bicarbonate: 27.9 mmol/L (ref 20.0–28.0)
O2 Saturation: 94 %
Patient temperature: 37
pCO2, Ven: 44 mmHg (ref 44.0–60.0)
pH, Ven: 7.41 (ref 7.250–7.430)
pO2, Ven: 70 mmHg — ABNORMAL HIGH (ref 32.0–45.0)

## 2020-03-17 LAB — HEMOGLOBIN A1C
Hgb A1c MFr Bld: 6.7 % — ABNORMAL HIGH (ref 4.8–5.6)
Mean Plasma Glucose: 146 mg/dL

## 2020-03-20 LAB — CULTURE, BLOOD (ROUTINE X 2)
Culture: NO GROWTH
Culture: NO GROWTH
Special Requests: ADEQUATE
Special Requests: ADEQUATE

## 2020-04-13 ENCOUNTER — Telehealth: Payer: Self-pay | Admitting: Nurse Practitioner

## 2020-04-13 NOTE — Telephone Encounter (Signed)
Spoke with patient regarding Palliative referral and he was in agreement with scheduling visit.  I have scheduled an In-person Consult for 04/20/20 @ 3 PM

## 2020-04-20 ENCOUNTER — Telehealth: Payer: Self-pay | Admitting: Nurse Practitioner

## 2020-04-20 ENCOUNTER — Other Ambulatory Visit: Payer: Self-pay

## 2020-04-20 ENCOUNTER — Other Ambulatory Visit: Payer: Medicare Other | Admitting: Nurse Practitioner

## 2020-04-20 NOTE — Telephone Encounter (Signed)
I called Shane Reid home phone number listed [phone] in epic and [employer] as home phone, wrong number. I called Shane Reid listed cell phone number [phone], no answer, message left with contact information

## 2020-04-26 ENCOUNTER — Other Ambulatory Visit: Payer: Medicare Other | Admitting: Nurse Practitioner

## 2020-04-26 ENCOUNTER — Other Ambulatory Visit: Payer: Self-pay

## 2020-04-26 ENCOUNTER — Encounter: Payer: Self-pay | Admitting: Nurse Practitioner

## 2020-04-26 DIAGNOSIS — J84112 Idiopathic pulmonary fibrosis: Secondary | ICD-10-CM

## 2020-04-26 NOTE — Progress Notes (Signed)
[facility] Consult Note Telephone: [phone]  Fax: [phone]  PATIENT NAME: Shane Reid. DOB: 01/27/1946 MRN: [id number]  PRIMARY CARE PROVIDER:   Elba Barman, MD  REFERRING PROVIDER:  Elba Barman, MD [address] [place],  [place] [address]  RESPONSIBLE PARTY:   Self  Due to the COVID-19 crisis, this visit was done via telemedicine from my office and it was initiated and consent by this patient and or family.  I was asked to see Shane Reid by Shane Shane Reid for Palliative care consult for complex medical decision making.  1. Advance Care Planning; Full code with aggressive interventions;   2. Goals of Care: Goals include to maximize quality of life and symptom management. Our advance care planning conversation included a discussion about:     The value and importance of advance care planning   Exploration of personal, cultural or spiritual beliefs that might influence medical decisions   Exploration of goals of care in the event of a sudden injury or illness   Identification and preparation of a healthcare agent   Review and updating or creation of an  advance directive document. 3. Palliative care encounter; Palliative care encounter; Palliative medicine team will continue to support patient, patient'Reid family, and medical team. Visit consisted of counseling and education dealing with the complex and emotionally intense issues of symptom management and palliative care in the setting of serious and potentially life-threatening illness  4. f/u 4 weeks for ongoing monitoring of disease progression of pulmonary fibrosis, symptoms of dyspnea, complex medical decision making  I spent 60 minutes providing this consultation,  from 3:00pm to 4:00pm. More than 50% of the time in this consultation was spent coordinating communication.   HISTORY OF PRESENT ILLNESS:  Shane Reid. is a 74 y.o. year old male with  multiple medical problems including Idiopathic pulmonary fibrosis, coronary artery disease Reid/p stent, COPD, congestive heart failure, dysphasia, hypertension, heart murmur, diabetes, COPD, asthma, chronic chest pain, gerd, anxiety. Scene by pulmonology 11/15 / 2021 with more exertion on continuous oxygen. He is taking ofev for idiopathic pulmonary fibrosis and to continue.  Shane. Reid called and requested in person palliative care visit to be changed to telemedicine telephonic is video not available. Changed appointment to telemedicine. I called Shane. Reid. We talked about purpose of Palliative care visit. Shane. Reid in agreement. We talked about how Shane. Reid was feeling. Shane. Reid endorses he is taking one day at a time. We talked about life review as he worked in [employer] but ended up retiring early due to his Corner artery disease around 2006. Shane. Reid talked adopting a child having grandchildren and now living in caring for his ex-wife. Shane. Reid  endorses his ex-wife is bed-bound and requires total care. Shane. Reid  endorses that he does bath and dress, cook for her. Shane. Reid and I talked about past medical history when he was diagnosed with idiopathic pulmonary fibrosis symptoms and treatments said he has undergone. Shane. Reid endorses the treatments have made him sick so he stopped. Shane. Reid endorses one of the pulmonologist that was following him told him that he would have to discharge him if he did not continue to participate in the medication. Shane. Reid endorses he was able to be a another Pulmonologist whom he likes. Shane. Reid endorsed his Pulmonologist talked about the medications, deciding that he did not want to take them. Shane. Reid endorses his current  Pulmonologist, Shane Reid did not discharge him. Shane. Reid is scheduled to see him next week. We talked about continuous oxygen. Shane Reid endorses that he does struggle with not having enough in his smaller tank. When Shane. Reid goes  to the grocery store, runs his errands he often has to make short trips. Shane. Reid talked about being able to mow his yard yesterday. We talked about when Shane. Reid takes his oxygen off he drops his O2 saturation to the sixties. Shane. Reid endorses it does not take much for him to get short of breath. We talked about how he manages his shortness of breath. Shane. Reid endorsed is he is not required nebulizer treatments in over 2 months. Shane. Reid endorses he does require his oxygen continuously. Shane. Reid endorses he also uses his vest that helps him considerably finding that he requires it twice a day. Shane. Reid endorses that he does not go a day without using his vest. We talked about symptoms of pain. Shane Reid endorses that he does experience pain from the fibrosis in his lungs. We talked about what gives him relief which is rest, using the vest and his oxygen. We talked about his appetite. Shane. Reid  endorses his appetite is declined. Shane. Reid does make whatever his ex-wife wants to eat. Shane. Reid endorses that he does not have much taste for food. Shane. Reid endorses no change in clothes size currently. Shane. Reid endorses he is sleeping okay. We talked about medical goals of care including aggressive vs conservative versus comfort care. Shane. Reid  endorses that he would want to have CPR, full code. Shane. Reid endorsed is he has had CPR in the past and was able to be resuscitated. Shane. Reid  endorses he does understand is pulmonary fibrosis is progressive, he is overall declining. Shane. Reid endorses he is taking one day at a time. We talked about if he was not able to speak for himself who would he want to talk for him. Shane. Reid endorses is he would want his ex-wife to be his POA. We talked about role of Palliative care and plan of care. We talked about follow up Palliative care visit after his next Pulmonology appointment with Shane Reid. Shane. Reid in agreement, appointment scheduled though  telemedicine telephonic is video not available. Shane. Reid endorses his wishes are to do telemedicine not in person visits at this time. Therapeutic listening and emotional support provided. Contact information. Questions answered to satisfaction. . Palliative Care was asked to help address goals of care.   CODE STATUS: Full code  PPS: 60% HOSPICE ELIGIBILITY/DIAGNOSIS: TBD  PAST MEDICAL HISTORY:  Past Medical History:  Diagnosis Date  . Anxiety   . Asthma   . Bradycardia   . CHF (congestive heart failure) ([facility])   . Chronic Chest Pain   . COPD (chronic obstructive pulmonary disease) ([facility])   . Coronary artery disease    a. 2008 Reid/p PCI to LAD;  b. 2013 Cath: patent stent->Med Rx;  c. 11/2013 MV: EF 67%, no ischemia/infarct.  . Depression   . Diabetes mellitus without complication ([facility])   . GERD (gastroesophageal reflux disease)   . Heart murmur    a. 02/2010 Echo: EF 55-60%, no rwma, Gr 2 DD, triv Shane, mildly dil LA, nl RV.  [name] Hypercholesteremia   . Hypertension   . Hypertensive heart disease   . Idiopathic pulmonary fibrosis ([facility])    a. Seen by Shane. Stevenson Clinch 01/2015 and Rx Esbriet -  pt cannot afford.  . Other social stressor    a. wife with mental illness    SOCIAL HX:  Social History   Tobacco Use  . Smoking status: Former Smoker    Packs/day: 0.50    Years: 1.00    Pack years: 0.50    Types: Cigarettes    Quit date: 05/09/1968    Years since quitting: 52.0  . Smokeless tobacco: Never Used  Substance Use Topics  . Alcohol use: No    ALLERGIES:  Allergies  Allergen Reactions  . Ofev [Nintedanib]   . Metoprolol Succinate Other (See Comments)    Reaction:  Unknown   . Niacin Other (See Comments)    Reaction:  Unknown   . Sertraline Hcl Other (See Comments)    Reaction:  Unknown      PERTINENT MEDICATIONS:  Outpatient Encounter Medications as of 04/26/2020  Medication Sig  . Accu-Chek Softclix Lancets lancets USE AS DIRECTED EVERY DAY  . albuterol (PROVENTIL)  (2.5 MG/3ML) 0.083% nebulizer solution Take 3 mLs (2.5 mg total) by nebulization every 6 (six) hours as needed.  [name] albuterol (VENTOLIN HFA) 108 (90 Base) MCG/ACT inhaler Inhale 2 puffs into the lungs every 4 (four) hours as needed for wheezing.  [name] amLODipine (NORVASC) 5 MG tablet Take 1 tablet (5 mg total) by mouth daily.  [name] aspirin EC 81 MG tablet Take 81 mg by mouth every morning.  . benzonatate (TESSALON) 100 MG capsule Take 100 mg by mouth 3 (three) times daily.  . Blood Glucose Monitoring Suppl (ACCU-CHEK GUIDE ME) w/Device KIT DX E11.9 (MEDICAID PREFERRED)  . cefdinir (OMNICEF) 300 MG capsule Take 1 capsule (300 mg total) by mouth 2 (two) times daily.  . diclofenac Sodium (VOLTAREN) 1 % GEL Apply 1 application topically 4 (four) times daily.   [name] HYDROcodone-acetaminophen (NORCO/VICODIN) 5-325 MG tablet Take 1 tablet by mouth every 4 (four) hours as needed for moderate pain.  [name] lactulose (CHRONULAC) 10 GM/15ML solution Take 30 mLs (20 g total) by mouth daily as needed for mild constipation.  . meloxicam (MOBIC) 7.5 MG tablet Take 1 tablet (7.5 mg total) by mouth daily.  . metFORMIN (GLUCOPHAGE) 1000 MG tablet Take 1,000 mg by mouth 2 (two) times daily with a meal.  . Multiple Vitamins-Minerals (CENTRUM SILVER PO) Take 1 tablet by mouth daily.   . nitroGLYCERIN (NITROSTAT) 0.4 MG SL tablet Place 1 tablet (0.4 mg total) under the tongue every 5 (five) minutes as needed.  . pantoprazole (PROTONIX) 40 MG tablet Take 1 tablet (40 mg total) by mouth daily.  . predniSONE (DELTASONE) 20 MG tablet Take 1 tablet (20 mg total) by mouth daily with breakfast.  . pregabalin (LYRICA) 50 MG capsule Take 100 mg by mouth 2 (two) times daily.   . rosuvastatin (CRESTOR) 10 MG tablet TAKE 1 TABLET (10 MG TOTAL) BY MOUTH DAILY. (Patient taking differently: Take 10 mg by mouth daily. TAKE 1 TABLET (10 MG TOTAL) BY MOUTH DAILY.)   No facility-administered encounter medications on file as of 04/26/2020.     PHYSICAL EXAM:  Deferred  Kemia Wendel Z Shalay Carder, NP

## 2020-05-14 ENCOUNTER — Telehealth: Payer: Self-pay | Admitting: Cardiovascular Disease

## 2020-05-14 ENCOUNTER — Encounter: Payer: Self-pay | Admitting: Nurse Practitioner

## 2020-05-14 ENCOUNTER — Other Ambulatory Visit: Payer: Medicare Other | Admitting: Nurse Practitioner

## 2020-05-14 ENCOUNTER — Other Ambulatory Visit: Payer: Self-pay

## 2020-05-14 DIAGNOSIS — Z515 Encounter for palliative care: Secondary | ICD-10-CM

## 2020-05-14 DIAGNOSIS — J84112 Idiopathic pulmonary fibrosis: Secondary | ICD-10-CM

## 2020-05-14 MED ORDER — AMLODIPINE BESYLATE 5 MG PO TABS
5.0000 mg | ORAL_TABLET | Freq: Every day | ORAL | 3 refills | Status: DC
Start: 2020-05-14 — End: 2020-10-19

## 2020-05-14 MED ORDER — ROSUVASTATIN CALCIUM 10 MG PO TABS
ORAL_TABLET | ORAL | 2 refills | Status: DC
Start: 2020-05-14 — End: 2021-02-14

## 2020-05-14 NOTE — Telephone Encounter (Signed)
*  STAT* If patient is at the pharmacy, call can be transferred to refill team.   1. Which medications need to be refilled? (please list name of each medication and dose if known) rosuvastatin 10 MG   2. Which pharmacy/location (including street and city if local pharmacy) is [facility] to[facility] [place]   3. Do they need a 30 day or 90 day supply? 90 day

## 2020-05-14 NOTE — Progress Notes (Signed)
[facility] Consult Note Telephone: [phone]  Fax: [phone]  PATIENT NAME: Shane Reid. DOB: 1946-01-24 MRN: [id number]  PRIMARY CARE PROVIDER:   Elba Barman, MD  REFERRING PROVIDER:  Elba Barman, MD [place] [place],  [place] [address]  RESPONSIBLE PARTY:   Self  Due to the COVID-19 crisis, this visit was done via telemedicine from my office and it was initiated and consent by this patient and or family.  I was asked to see Shane Reid by Dr Loma Newton for Palliative care consult for complex medical decision making.  1.Advance Care Planning; Full code with aggressive interventions;   2. Goals of Care: Goals include to maximize quality of life and symptom management. Our advance care planning conversation included a discussion about:   The value and importance of advance care planning  Exploration of personal, cultural or spiritual beliefs that might influence medical decisions  Exploration of goals of care in the event of a sudden injury or illness  Identification and preparation of a healthcare agent  Review and updating or creation of anadvance directive document. 3.Palliative care encounter; Palliative care encounter; Palliative medicine team will continue to support patient, patient's family, and medical team. Visit consisted of counseling and education dealing with the complex and emotionally intense issues of symptom management and palliative care in the setting of serious and potentially life-threatening illness  4. f/u 4 weeks for ongoing monitoring of disease progression of pulmonary fibrosis, symptoms of dyspnea, complex medical decision making  I spent 50 minutes providing this consultation,  Starting at 2:00pm. More than 50% of the time in this consultation was spent coordinating communication.   HISTORY OF PRESENT ILLNESS:  Shane Stoklosa. is a 74 y.o. year old male with  multiple medical problems including Idiopathic pulmonary fibrosis, coronary artery disease s/p stent, COPD, congestive heart failure, dysphasia, hypertension, heart murmur, diabetes, COPD, asthma, chronic chest pain, gerd, anxiety. Telemedicine telephone by video not available follow-up palliative care it. Shane. Reid in agreement. We talked about how he was feeling.  We talked about symptoms of pain for which Shane Reid endorses he is comfortable. We talked about shortness of breath. Shane. Reid endorses that he does always remain short of breath. He continuously wears his oxygen. He does drive, run his errands as he is the sole caregiver for his bed bath, total dependent wife. We talked about taking its time getting his errands done. Very tired and short of breath take him twice as long. We talked about taking his time. Shane Reid endorses that it takes twice the time to run his errands but he is okay with that. We talked about endurance. We talked about energy conservation. We talked about importance of sleep. We talked about it sleep pattern. Shane. Reid endorsed is that he has been sleeping well. We talked about sleep hygiene. We talked about his appetite continues to be declined. We talked about weight. We talked about nutrition. We talked about medical goals of care. We talked about role of Palliative care and plan of care. Ask if LOL Palliative visit can be in person in Shane. Reid in agreement. We talked about next Palliative care visit in 4 weeks it's needed or sooner should he declined. Shane. Reid in agreement, appointment scheduled. Therapeutic listening, emotional support. Questions answered to his satisfaction. Contact information provided.  Palliative Care was asked to help to continue address goals of care.   CODE STATUS: Full  code  PPS: 60% HOSPICE ELIGIBILITY/DIAGNOSIS: TBD  PAST MEDICAL HISTORY:  Past Medical History:  Diagnosis Date  . Anxiety   . Asthma   . Bradycardia   . CHF  (congestive heart failure) ([facility])   . Chronic Chest Pain   . COPD (chronic obstructive pulmonary disease) ([facility])   . Coronary artery disease    a. 2008 s/p PCI to LAD;  b. 2013 Cath: patent stent->Med Rx;  c. 11/2013 MV: EF 67%, no ischemia/infarct.  . Depression   . Diabetes mellitus without complication ([facility])   . GERD (gastroesophageal reflux disease)   . Heart murmur    a. 02/2010 Echo: EF 55-60%, no rwma, Gr 2 DD, triv Shane, mildly dil LA, nl RV.  [name] Hypercholesteremia   . Hypertension   . Hypertensive heart disease   . Idiopathic pulmonary fibrosis ([facility])    a. Seen by Dr. Stevenson Clinch 01/2015 and Rx Esbriet - pt cannot afford.  . Other social stressor    a. wife with mental illness    SOCIAL HX:  Social History   Tobacco Use  . Smoking status: Former Smoker    Packs/day: 0.50    Years: 1.00    Pack years: 0.50    Types: Cigarettes    Quit date: 05/09/1968    Years since quitting: 52.0  . Smokeless tobacco: Never Used  Substance Use Topics  . Alcohol use: No    ALLERGIES:  Allergies  Allergen Reactions  . Ofev [Nintedanib]   . Metoprolol Succinate Other (See Comments)    Reaction:  Unknown   . Niacin Other (See Comments)    Reaction:  Unknown   . Sertraline Hcl Other (See Comments)    Reaction:  Unknown      PERTINENT MEDICATIONS:  Outpatient Encounter Medications as of 05/14/2020  Medication Sig  . Accu-Chek Softclix Lancets lancets USE AS DIRECTED EVERY DAY  . albuterol (PROVENTIL) (2.5 MG/3ML) 0.083% nebulizer solution Take 3 mLs (2.5 mg total) by nebulization every 6 (six) hours as needed.  [name] albuterol (VENTOLIN HFA) 108 (90 Base) MCG/ACT inhaler Inhale 2 puffs into the lungs every 4 (four) hours as needed for wheezing.  [name] aspirin EC 81 MG tablet Take 81 mg by mouth every morning.  . benzonatate (TESSALON) 100 MG capsule Take 100 mg by mouth 3 (three) times daily.  . Blood Glucose Monitoring Suppl (ACCU-CHEK GUIDE ME) w/Device KIT DX E11.9 (MEDICAID PREFERRED)  .  cefdinir (OMNICEF) 300 MG capsule Take 1 capsule (300 mg total) by mouth 2 (two) times daily.  . diclofenac Sodium (VOLTAREN) 1 % GEL Apply 1 application topically 4 (four) times daily.   [name] HYDROcodone-acetaminophen (NORCO/VICODIN) 5-325 MG tablet Take 1 tablet by mouth every 4 (four) hours as needed for moderate pain.  [name] lactulose (CHRONULAC) 10 GM/15ML solution Take 30 mLs (20 g total) by mouth daily as needed for mild constipation.  . meloxicam (MOBIC) 7.5 MG tablet Take 1 tablet (7.5 mg total) by mouth daily.  . metFORMIN (GLUCOPHAGE) 1000 MG tablet Take 1,000 mg by mouth 2 (two) times daily with a meal.  . Multiple Vitamins-Minerals (CENTRUM SILVER PO) Take 1 tablet by mouth daily.   . nitroGLYCERIN (NITROSTAT) 0.4 MG SL tablet Place 1 tablet (0.4 mg total) under the tongue every 5 (five) minutes as needed.  . pantoprazole (PROTONIX) 40 MG tablet Take 1 tablet (40 mg total) by mouth daily.  . predniSONE (DELTASONE) 20 MG tablet Take 1 tablet (20 mg total) by mouth daily with  breakfast.  . pregabalin (LYRICA) 50 MG capsule Take 100 mg by mouth 2 (two) times daily.   . [DISCONTINUED] amLODipine (NORVASC) 5 MG tablet Take 1 tablet (5 mg total) by mouth daily.  . [DISCONTINUED] rosuvastatin (CRESTOR) 10 MG tablet TAKE 1 TABLET (10 MG TOTAL) BY MOUTH DAILY. (Patient taking differently: Take 10 mg by mouth daily. TAKE 1 TABLET (10 MG TOTAL) BY MOUTH DAILY.)   No facility-administered encounter medications on file as of 05/14/2020.    PHYSICAL EXAM:  Deferred  Emmelyn Schmale Z Chelby Salata, NP

## 2020-05-14 NOTE — Telephone Encounter (Signed)
*  STAT* If patient is at the pharmacy, call can be transferred to refill team.   1. Which medications need to be refilled? (please list name of each medication and dose if known)   Amlodipine 5 mg po q d   2. Which pharmacy/location (including street and city if local pharmacy) is medication to be sent to?[facility]   3. Do they need a 30 day or 90 day supply? 90

## 2020-06-02 ENCOUNTER — Telehealth: Payer: Self-pay

## 2020-06-02 NOTE — Telephone Encounter (Signed)
[facility]   " Patient request new prescription he reports being on amlodipine/benazepril combo product."  After review patients chart - at his last office visit (10/21/2019) Dr Mariah Milling noted  "Stop the amlodipine/benazepril (secondary to cough)  Start amlodipine 5 mg daily"  [facility] to [facility] which [facility] has been being [facility]ills.  Pharmacists states Amlodipine 5 MG is what has been being refilled.   Called patient to make him aware that he should be taking Amlodipine 5 MG. No answer. LMVO.

## 2020-06-10 ENCOUNTER — Telehealth: Payer: Self-pay

## 2020-06-10 NOTE — Telephone Encounter (Signed)
Received a message from patient and wife expressing concerns that patient has constant cough. Returned call placed to patient/wife. VM left.

## 2020-06-22 ENCOUNTER — Telehealth: Payer: Self-pay | Admitting: Nurse Practitioner

## 2020-06-22 ENCOUNTER — Encounter: Payer: Self-pay | Admitting: Nurse Practitioner

## 2020-06-22 ENCOUNTER — Other Ambulatory Visit: Payer: Medicare Other | Admitting: Nurse Practitioner

## 2020-06-22 ENCOUNTER — Other Ambulatory Visit: Payer: Self-pay

## 2020-06-22 DIAGNOSIS — J84112 Idiopathic pulmonary fibrosis: Secondary | ICD-10-CM

## 2020-06-22 DIAGNOSIS — Z515 Encounter for palliative care: Secondary | ICD-10-CM

## 2020-06-22 NOTE — Progress Notes (Signed)
[facility] Consult Note Telephone: [phone]  Fax: [phone]  PATIENT NAME: Shane Reid. DOB: [birth year] MRN: [id number]  PRIMARY CARE PROVIDER:   Elba Barman, MD  REFERRING PROVIDER:  Elba Barman, MD [address] [place],  [place] [address]  RESPONSIBLE PARTY:   Self  1.Advance Care Planning;Full code with aggressive interventions;  2. Goals of Care: Goals include to maximize quality of life and symptom management. Our advance care planning conversation included a discussion about:   The value and importance of advance care planning  Exploration of personal, cultural or spiritual beliefs that might influence medical decisions  Exploration of goals of care in the event of a sudden injury or illness  Identification and preparation of a healthcare agent  Review and updating or creation of anadvance directive document.  3.Palliative care encounter; Palliative care encounter; Palliative medicine team will continue to support patient, patient's family, and medical team. Visit consisted of counseling and education dealing with the complex and emotionally intense issues of symptom management and palliative care in the setting of serious and potentially life-threatening illness  4. f/u2 weeks for ongoing monitoring of disease progression of pulmonary fibrosis, symptoms of dyspnea, complex medical decision making  I spent 60 minutes providing this consultation,  from 9:30am to 10:30am. More than 50% of the time in this consultation was spent coordinating communication.   HISTORY OF PRESENT ILLNESS:  Shane Reid. is a 75 y.o. year old male with multiple medical problems including  Idiopathic pulmonary fibrosis, coronary artery disease s/p stent, COPD, congestive heart failure, dysphasia, hypertension, heart murmur, diabetes, COPD, asthma, chronic chest pain, gerd, anxiety. I called Shane Reid  to confirm follow up Palliative care visit in person, covid screening negative. Shane Reid in agreement. I visited Shane Reid in his home. Shane Reid was ambulatory without any devices. Mr Reid is on continuous oxygen at home. We talked about how he is feeling today. Shane Reid endorses he continues to have a chronic cough and recent diagnosis of pneumonia. We talked about doxycycline antibiotic that he currently is taking. We talked about increasing fluids. We talked about management of chronic cough. We talked about chronic disease progression of interstitial lung disease. We talked about the importance of taking OFEV. Shane Reid endorses that that does give him diarrhea at times. Encourage Shane Reid to take probiotic or ppi. We talked about worsening decline. We talked about progression of interstitial lung disease, pulmonary fibrosis. We talked about medical goals of care including aggressive versus conservative versus Comfort Care. Mr Reid endorses his adopted son and wife both wish for him not to be on a ventilator. When asked Shane Reid what his wishes are, Shane Reid endorses he is not sure. We talked about scenarios of a ventilator, CPR. Shane Reid endorses he will continue to talk with his son and wife about wishes. We talked about the percussion vest that he wears twice a day. We talked about the nebulizer treatments that he does take duonebs. Shane Reid endorses that he does do a nebulizer treatment every couple days. Requesting [name] do nebulizer treatments tid for the next three to five days. We talked about the importance of following up with PCP, pulmonary with worsening symptoms of pneumonia. Discussed allergies. We talked about adding a second antibiotic. Will send in Augmentin 875 / 125 mg tid x 10 days, called into Pharmacy no refill, number 20. We talked about concern for  worsening condition. Shane Reid endorsed is that he is the sole caregiver of himself and wife. They don't get help  from their children. Mr Reid endorses that he does continue to drive and brings his continuous oxygen with him. Shane Reid drove himself to the emergency department this last time. Mr Reid endorses that he has to go to the grocery store and run all the errands. Shane Reid endorses that he has felt better in the past but today's just been a more difficult day. We talked about possibly seeing if a respiratory home therapist can make a visit will contact PCP, pulmonology to see if this is a possible option. Shane Reid endorses he is open to the idea. We talked about how he is high risk for hospitalizations. We talked about his appetite which is poor. Mr Reid endorses sometimes he only needs a few bites of food at times. We talked about the importance of nutrition. We talked about overall decline and debility. We talked about sleep, sleep patterns and hygiene. Shane Reid talked at length about having to care for his wife at home who is completely reliant on him. We talked about role of Palliative care and plan of care. We talked about follow up Palliative care visit in two weeks of close monitoring of respiratory symptoms. Shane Reid in agreement, appointments scheduled. We did talk about when it was time to call 911 or return to the emergency department for respiratory distress. Shane Reid verbalized understanding. Therapeutic listening and emotional support provided. Contact information. Questions answered to satisfaction.  ROS: all negative except +cough, +wheezing, +fatigue, +weakness  Palliative Care was asked to help to continue to address goals of care.   CODE STATUS: Full code  PPS: 50% HOSPICE ELIGIBILITY/DIAGNOSIS: TBD  PAST MEDICAL HISTORY:  Past Medical History:  Diagnosis Date  . Anxiety   . Asthma   . Bradycardia   . CHF (congestive heart failure) ([place])   . Chronic Chest Pain   . COPD (chronic obstructive pulmonary disease) ([place])   . Coronary artery disease    a. 2008 s/p PCI to  LAD;  b. 2013 Cath: patent stent->Med Rx;  c. 11/2013 MV: EF 67%, no ischemia/infarct.  . Depression   . Diabetes mellitus without complication ([place])   . GERD (gastroesophageal reflux disease)   . Heart murmur    a. 02/2010 Echo: EF 55-60%, no rwma, Gr 2 DD, triv MR, mildly dil LA, nl RV.  [name] Hypercholesteremia   . Hypertension   . Hypertensive heart disease   . Idiopathic pulmonary fibrosis ([place])    a. Seen by Dr. Stevenson Clinch 01/2015 and Rx Esbriet - pt cannot afford.  . Other social stressor    a. wife with mental illness    SOCIAL HX:  Social History   Tobacco Use  . Smoking status: Former Smoker    Packs/day: 0.50    Years: 1.00    Pack years: 0.50    Types: Cigarettes    Quit date: 05/09/1968    Years since quitting: 52.1  . Smokeless tobacco: Never Used  Substance Use Topics  . Alcohol use: No    ALLERGIES:  Allergies  Allergen Reactions  . Ofev [Nintedanib]   . Metoprolol Succinate Other (See Comments)    Reaction:  Unknown   . Niacin Other (See Comments)    Reaction:  Unknown   . Sertraline Hcl Other (See Comments)    Reaction:  Unknown      PERTINENT MEDICATIONS:  Outpatient Encounter  Medications as of 06/22/2020  Medication Sig  . Accu-Chek Softclix Lancets lancets USE AS DIRECTED EVERY DAY  . albuterol (PROVENTIL) (2.5 MG/3ML) 0.083% nebulizer solution Take 3 mLs (2.5 mg total) by nebulization every 6 (six) hours as needed.  [name] albuterol (VENTOLIN HFA) 108 (90 Base) MCG/ACT inhaler Inhale 2 puffs into the lungs every 4 (four) hours as needed for wheezing.  [name] amLODipine (NORVASC) 5 MG tablet Take 1 tablet (5 mg total) by mouth daily.  [name] aspirin EC 81 MG tablet Take 81 mg by mouth every morning.  . benzonatate (TESSALON) 100 MG capsule Take 100 mg by mouth 3 (three) times daily.  . Blood Glucose Monitoring Suppl (ACCU-CHEK GUIDE ME) w/Device KIT DX E11.9 (MEDICAID PREFERRED)  . cefdinir (OMNICEF) 300 MG capsule Take 1 capsule (300 mg total) by mouth 2 (two) times  daily.  . diclofenac Sodium (VOLTAREN) 1 % GEL Apply 1 application topically 4 (four) times daily.   [name] HYDROcodone-acetaminophen (NORCO/VICODIN) 5-325 MG tablet Take 1 tablet by mouth every 4 (four) hours as needed for moderate pain.  [name] lactulose (CHRONULAC) 10 GM/15ML solution Take 30 mLs (20 g total) by mouth daily as needed for mild constipation.  . meloxicam (MOBIC) 7.5 MG tablet Take 1 tablet (7.5 mg total) by mouth daily.  . metFORMIN (GLUCOPHAGE) 1000 MG tablet Take 1,000 mg by mouth 2 (two) times daily with a meal.  . Multiple Vitamins-Minerals (CENTRUM SILVER PO) Take 1 tablet by mouth daily.   . nitroGLYCERIN (NITROSTAT) 0.4 MG SL tablet Place 1 tablet (0.4 mg total) under the tongue every 5 (five) minutes as needed.  . pantoprazole (PROTONIX) 40 MG tablet Take 1 tablet (40 mg total) by mouth daily.  . predniSONE (DELTASONE) 20 MG tablet Take 1 tablet (20 mg total) by mouth daily with breakfast.  . pregabalin (LYRICA) 50 MG capsule Take 100 mg by mouth 2 (two) times daily.   . rosuvastatin (CRESTOR) 10 MG tablet TAKE 1 TABLET (10 MG TOTAL) BY MOUTH DAILY.   No facility-administered encounter medications on file as of 06/22/2020.    PHYSICAL EXAM:   General:frail appearing, thin, chronically ill, O2 dependent pleasant male Cardiovascular: regular rate and rhythm Pulmonary: decrease bases, fine crackles, few wheezes Extremities: no edema, no joint deformities; muscle wasting Skin: no rashes Neurological: ambulatory  Cassady Stanczak Ihor Gully, NP

## 2020-06-22 NOTE — Telephone Encounter (Signed)
I called Mr Ginsberg on home number listed (wrong number) and cell phone, no answer, message left to confirm PC visit today, covid screening prior to visit. Asked to return call with contact information

## 2020-07-12 ENCOUNTER — Other Ambulatory Visit: Payer: Self-pay

## 2020-07-12 ENCOUNTER — Encounter: Payer: Self-pay | Admitting: Nurse Practitioner

## 2020-07-12 ENCOUNTER — Other Ambulatory Visit: Payer: Medicare Other | Admitting: Nurse Practitioner

## 2020-07-12 DIAGNOSIS — Z515 Encounter for palliative care: Secondary | ICD-10-CM

## 2020-07-12 DIAGNOSIS — J84112 Idiopathic pulmonary fibrosis: Secondary | ICD-10-CM

## 2020-07-12 NOTE — Progress Notes (Signed)
[facility] Consult Note Telephone: [phone]  Fax: [phone]  PATIENT NAME: Shane Shane Reid. DOB: December 22, 1945 MRN: [id number]  PRIMARY CARE PROVIDER:   Elba Barman, MD  REFERRING PROVIDER:  Elba Barman, MD [address],  [place] [address]  RESPONSIBLE PARTY:   Self cell phone [phone]  1.Advance Care Planning;Full code with aggressive interventions;Will have Hospice Physician review for eligibility   2. Goals of Care: Goals include to maximize quality of life and symptom management. Our advance care planning conversation included a discussion about:   The value and importance of advance care planning  Exploration of personal, cultural or spiritual beliefs that might influence medical decisions  Exploration of goals of care in the event of a sudden injury or illness  Identification and preparation of a healthcare agent  Review and updating or creation of anadvance directive document.  3.Palliative care encounter; Palliative care encounter; Palliative medicine team will continue to support patient, patient's family, and medical team. Visit consisted of counseling and education dealing with the complex and emotionally intense issues of symptom management and palliative care in the setting of serious and potentially life-threatening illness  4. f/u2 weeks for ongoing monitoring of disease progression of pulmonary fibrosis, symptoms of dyspnea, complex medical decision making  I spent 60 minutes providing this consultation,  from 12:00pm to 1:00pm. More than 50% of the time in this consultation was spent coordinating communication.   HISTORY OF PRESENT ILLNESS:  Shane Shane Reid. is a 75 y.o. year old male with multiple medical problems including Idiopathic pulmonary fibrosis, coronary artery disease s/p stent, COPD, congestive heart failure, dysphasia, hypertension, heart murmur,  diabetes, COPD, asthma, chronic chest pain, gerd, anxiety. In-person Palliative care visit follow-up for Shane Shane Reid. I called Shane Shane Reid to confirm Palliative care visit in covid screening which was negative. Shane Shane Reid was currently in his room, on continuous oxygen. Shane Shane Reid does have trouble catching his breath after exertion, ambulating. Shane Shane Reid and I talked about how he has been feeling today. Shane Shane Reid endorsed is he saw Shane Shane Reid on Friday and received prescription for cough medicine which has helped a little bit. We talked about symptoms of shortness of breath. We talked about nebulizers. We talked about continuous oxygen. We talked about energy conservation. We talked about chronic disease progression of interstitial lung disease. We talked about appetite which has been poor. We talked about weight loss as his pants have become very loose. Shane. Shane Shane Reid endorses EMS came out to check in the other day as he was wheezing in the bathroom and had a very hard time catching his breath is granddaughter called 911. When EMS got there they realized the 02 tubing was bent, Shane Shane Reid was not getting oxygen. When fixed Shane Shane Reid breathing improved and refused a EMS transport. We talked about overall progression with realistic expectations concerning interstitial lung disease. We talked about medical goals of care focusing more on comfort. Shane Shane Reid endorses that he has a GI consult schedule but he does not want to do that to see if he is having trouble swallowing. We visits with his Pulmonologist which Shane. Shane Reid does not feel is necessary a. Shane Shane Reid endorses he has been told there is nothing else that can be done for him and his condition. We talked about something management. We talked about Hospice benefit under Medicare program. We talked about what services will be provided. Shane Shane Reid was in agreement  to have Hospice Physicians review case. Shane Shane Reid does become very short of breath walking within 10 to 12 ft  with O2 saturation in the 70s and having to increase his oxygen to 5L/Shane Reid. Once Shane. Shane Reid is ay rest his O2 saturation is increase,  able to turn his oxygen back down to 2L/[name]. We talked about something management nebulizers. Shane Shane Reid denies symptoms of pain. We talked about Hospice criteria under Medicare benefit. We talked about role of Palliative care and plan of care. Discussed with Shane. Shane Reid will recontact once Hospice Physicians determine eligibility. Shane. [place] in agreement. Therapeutic listening, emotional support provided. Contact information. Questions answered to satisfaction.  Palliative Care was asked to help to continue to address goals of care.   CODE STATUS: full code  PPS: 40% HOSPICE ELIGIBILITY/DIAGNOSIS: TBD  PAST MEDICAL HISTORY:  Past Medical History:  Diagnosis Date  . Anxiety   . Asthma   . Bradycardia   . CHF (congestive heart failure) ([place])   . Chronic Chest Pain   . COPD (chronic obstructive pulmonary disease) ([place])   . Coronary artery disease    a. 2008 s/p PCI to LAD;  b. 2013 Cath: patent stent->Med Rx;  c. 11/2013 MV: EF 67%, no ischemia/infarct.  . Depression   . Diabetes mellitus without complication ([place])   . GERD (gastroesophageal reflux disease)   . Heart murmur    a. 02/2010 Echo: EF 55-60%, no rwma, Gr 2 DD, triv Shane, mildly dil LA, nl RV.  [name] Hypercholesteremia   . Hypertension   . Hypertensive heart disease   . Idiopathic pulmonary fibrosis ([place])    a. Seen by Shane. Stevenson Clinch 01/2015 and Rx Esbriet - pt cannot afford.  . Other social stressor    a. wife with mental illness    SOCIAL HX:  Social History   Tobacco Use  . Smoking status: Former Smoker    Packs/day: 0.50    Years: 1.00    Pack years: 0.50    Types: Cigarettes    Quit date: 05/09/1968    Years since quitting: 52.2  . Smokeless tobacco: Never Used  Substance Use Topics  . Alcohol use: No    ALLERGIES:  Allergies  Allergen Reactions  . Ofev [Nintedanib]   . Metoprolol  Succinate Other (See Comments)    Reaction:  Unknown   . Niacin Other (See Comments)    Reaction:  Unknown   . Sertraline Hcl Other (See Comments)    Reaction:  Unknown      PERTINENT MEDICATIONS:  Outpatient Encounter Medications as of 07/12/2020  Medication Sig  . Accu-Chek Softclix Lancets lancets USE AS DIRECTED EVERY DAY  . albuterol (PROVENTIL) (2.5 MG/3ML) 0.083% nebulizer solution Take 3 mLs (2.5 mg total) by nebulization every 6 (six) hours as needed.  [name] albuterol (VENTOLIN HFA) 108 (90 Base) MCG/ACT inhaler Inhale 2 puffs into the lungs every 4 (four) hours as needed for wheezing.  [name] amLODipine (NORVASC) 5 MG tablet Take 1 tablet (5 mg total) by mouth daily.  [name] aspirin EC 81 MG tablet Take 81 mg by mouth every morning.  . benzonatate (TESSALON) 100 MG capsule Take 100 mg by mouth 3 (three) times daily.  . Blood Glucose Monitoring Suppl (ACCU-CHEK GUIDE ME) w/Device KIT DX E11.9 (MEDICAID PREFERRED)  . cefdinir (OMNICEF) 300 MG capsule Take 1 capsule (300 mg total) by mouth 2 (two) times daily.  . diclofenac Sodium (VOLTAREN) 1 % GEL Apply 1 application topically 4 (four) times daily.   [name]  HYDROcodone-acetaminophen (NORCO/VICODIN) 5-325 MG tablet Take 1 tablet by mouth every 4 (four) hours as needed for moderate pain.  [name] lactulose (CHRONULAC) 10 GM/15ML solution Take 30 mLs (20 g total) by mouth daily as needed for mild constipation.  . meloxicam (MOBIC) 7.5 MG tablet Take 1 tablet (7.5 mg total) by mouth daily.  . metFORMIN (GLUCOPHAGE) 1000 MG tablet Take 1,000 mg by mouth 2 (two) times daily with a meal.  . Multiple Vitamins-Minerals (CENTRUM SILVER PO) Take 1 tablet by mouth daily.   . nitroGLYCERIN (NITROSTAT) 0.4 MG SL tablet Place 1 tablet (0.4 mg total) under the tongue every 5 (five) minutes as needed.  . pantoprazole (PROTONIX) 40 MG tablet Take 1 tablet (40 mg total) by mouth daily.  . predniSONE (DELTASONE) 20 MG tablet Take 1 tablet (20 mg total) by mouth daily with  breakfast.  . pregabalin (LYRICA) 50 MG capsule Take 100 mg by mouth 2 (two) times daily.   . rosuvastatin (CRESTOR) 10 MG tablet TAKE 1 TABLET (10 MG TOTAL) BY MOUTH DAILY.   No facility-administered encounter medications on file as of 07/12/2020.    PHYSICAL EXAM:   General: chronically ill, O2 dependent, pleasant, frail appearing, thin male Cardiovascular: regular rate and rhythm Pulmonary: clear ant fields; decrease bases, few crackles left base Extremities: no edema, no joint deformities; muscle wasting Neurological: ambulatory, weak  Nataki Mccrumb Z Celestial Barnfield, NP

## 2020-07-23 ENCOUNTER — Other Ambulatory Visit: Payer: Self-pay

## 2020-07-23 ENCOUNTER — Emergency Department: Payer: Medicare Other

## 2020-07-23 ENCOUNTER — Emergency Department
Admission: EM | Admit: 2020-07-23 | Discharge: 2020-07-23 | Disposition: A | Discharge status: Home / Self Care | Payer: Medicare Other | Attending: Student in an Organized Health Care Education/Training Program | Admitting: Student in an Organized Health Care Education/Training Program

## 2020-07-23 DIAGNOSIS — Z87891 Personal history of nicotine dependence: Secondary | ICD-10-CM | POA: Diagnosis not present

## 2020-07-23 DIAGNOSIS — J45909 Unspecified asthma, uncomplicated: Secondary | ICD-10-CM | POA: Insufficient documentation

## 2020-07-23 DIAGNOSIS — Z20822 Contact with and (suspected) exposure to covid-19: Secondary | ICD-10-CM | POA: Diagnosis not present

## 2020-07-23 DIAGNOSIS — J441 Chronic obstructive pulmonary disease with (acute) exacerbation: Secondary | ICD-10-CM | POA: Insufficient documentation

## 2020-07-23 DIAGNOSIS — Z79899 Other long term (current) drug therapy: Secondary | ICD-10-CM | POA: Diagnosis not present

## 2020-07-23 DIAGNOSIS — R0602 Shortness of breath: Secondary | ICD-10-CM | POA: Diagnosis not present

## 2020-07-23 DIAGNOSIS — I509 Heart failure, unspecified: Secondary | ICD-10-CM | POA: Diagnosis not present

## 2020-07-23 DIAGNOSIS — I110 Hypertensive heart disease with heart failure: Secondary | ICD-10-CM | POA: Insufficient documentation

## 2020-07-23 DIAGNOSIS — E119 Type 2 diabetes mellitus without complications: Secondary | ICD-10-CM | POA: Diagnosis not present

## 2020-07-23 DIAGNOSIS — Z7984 Long term (current) use of oral hypoglycemic drugs: Secondary | ICD-10-CM | POA: Diagnosis not present

## 2020-07-23 DIAGNOSIS — I2510 Atherosclerotic heart disease of native coronary artery without angina pectoris: Secondary | ICD-10-CM | POA: Diagnosis not present

## 2020-07-23 DIAGNOSIS — Z7982 Long term (current) use of aspirin: Secondary | ICD-10-CM | POA: Diagnosis not present

## 2020-07-23 DIAGNOSIS — R059 Cough, unspecified: Secondary | ICD-10-CM | POA: Diagnosis not present

## 2020-07-23 DIAGNOSIS — J8410 Pulmonary fibrosis, unspecified: Secondary | ICD-10-CM

## 2020-07-23 LAB — CBC WITH DIFFERENTIAL/PLATELET
Abs Immature Granulocytes: 0.04 10*3/uL (ref 0.00–0.07)
Basophils Absolute: 0 10*3/uL (ref 0.0–0.1)
Basophils Relative: 0 %
Eosinophils Absolute: 0.2 10*3/uL (ref 0.0–0.5)
Eosinophils Relative: 2 %
HCT: 37.3 % — ABNORMAL LOW (ref 39.0–52.0)
Hemoglobin: 12.4 g/dL — ABNORMAL LOW (ref 13.0–17.0)
Immature Granulocytes: 0 %
Lymphocytes Relative: 15 %
Lymphs Abs: 1.7 10*3/uL (ref 0.7–4.0)
MCH: 27.7 pg (ref 26.0–34.0)
MCHC: 33.2 g/dL (ref 30.0–36.0)
MCV: 83.4 fL (ref 80.0–100.0)
Monocytes Absolute: 1.1 10*3/uL — ABNORMAL HIGH (ref 0.1–1.0)
Monocytes Relative: 10 %
Neutro Abs: 8.4 10*3/uL — ABNORMAL HIGH (ref 1.7–7.7)
Neutrophils Relative %: 73 %
Platelets: 291 10*3/uL (ref 150–400)
RBC: 4.47 MIL/uL (ref 4.22–5.81)
RDW: 13.5 % (ref 11.5–15.5)
WBC: 11.5 10*3/uL — ABNORMAL HIGH (ref 4.0–10.5)
nRBC: 0 % (ref 0.0–0.2)

## 2020-07-23 LAB — COMPREHENSIVE METABOLIC PANEL
ALT: 11 U/L (ref 0–44)
AST: 22 U/L (ref 15–41)
Albumin: 3.6 g/dL (ref 3.5–5.0)
Alkaline Phosphatase: 62 U/L (ref 38–126)
Anion gap: 10 (ref 5–15)
BUN: 13 mg/dL (ref 8–23)
CO2: 25 mmol/L (ref 22–32)
Calcium: 9 mg/dL (ref 8.9–10.3)
Chloride: 103 mmol/L (ref 98–111)
Creatinine, Ser: 0.89 mg/dL (ref 0.61–1.24)
GFR, Estimated: >60 mL/min (ref >60)
Glucose, Bld: 140 mg/dL — ABNORMAL HIGH (ref 70–99)
Potassium: 3.8 mmol/L (ref 3.5–5.1)
Sodium: 138 mmol/L (ref 135–145)
Total Bilirubin: 1.2 mg/dL (ref 0.3–1.2)
Total Protein: 7.8 g/dL (ref 6.5–8.1)

## 2020-07-23 LAB — RESP PANEL BY RT-PCR (FLU A&B, COVID) ARPGX2
Influenza A by PCR: NEGATIVE
Influenza B by PCR: NEGATIVE
SARS Coronavirus 2 by RT PCR: NEGATIVE

## 2020-07-23 LAB — TROPONIN I (HIGH SENSITIVITY)
Troponin I (High Sensitivity): 5 ng/L (ref <18)
Troponin I (High Sensitivity): 6 ng/L (ref <18)

## 2020-07-23 MED ORDER — IPRATROPIUM-ALBUTEROL 0.5-2.5 (3) MG/3ML IN SOLN
3.0000 mL | Freq: Once | RESPIRATORY_TRACT | Status: AC
  Administered 2020-07-23: 3 mL via RESPIRATORY_TRACT
  Filled 2020-07-23: qty 3

## 2020-07-23 MED ORDER — SODIUM CHLORIDE 0.9 % IV BOLUS
500.0000 mL | Freq: Once | INTRAVENOUS | Status: AC
  Administered 2020-07-23: 500 mL via INTRAVENOUS

## 2020-07-23 MED ORDER — IOHEXOL 350 MG/ML SOLN
75.0000 mL | Freq: Once | INTRAVENOUS | Status: AC | PRN
  Administered 2020-07-23: 75 mL via INTRAVENOUS

## 2020-07-23 MED ORDER — METHYLPREDNISOLONE SODIUM SUCC 125 MG IJ SOLR
80.0000 mg | Freq: Once | INTRAMUSCULAR | Status: AC
  Administered 2020-07-23: 80 mg via INTRAVENOUS
  Filled 2020-07-23: qty 2

## 2020-07-23 MED ORDER — BENZONATATE 100 MG PO CAPS: 100.0000 mg | ORAL_CAPSULE | Freq: Three times a day (TID) | ORAL | 0 refills | Status: DC | PRN

## 2020-07-23 MED ORDER — PREDNISONE 10 MG (21) PO TBPK: ORAL_TABLET | ORAL | 0 refills | Status: DC

## 2020-07-23 NOTE — ED Notes (Signed)
Patient transported to CT 

## 2020-07-23 NOTE — ED Notes (Signed)
Pt c/o cough and SOB x months.  Pt reports hx of pulmonary fibrosis, 3L home O2, and chronic cough. Pt reports seeing PCP this morning and was told he has "crackles" in lungs. Pt reports frequent use of OTC cough remedies and reports occasional phlegm production. Pt has conversational dyspnea.

## 2020-07-23 NOTE — Discharge Instructions (Addendum)
Please seek medical attention for any high fevers, chest pain, shortness of breath, change in behavior, persistent vomiting, bloody stool or any other new or concerning symptoms.  

## 2020-07-23 NOTE — ED Provider Notes (Signed)
Patients ct scan without PE or infection. Patient states he feels better after medication. He would like to be discharged home. Will send home with cough medication and further steroids.    Phineas Semen, MD 07/23/20 2212

## 2020-07-23 NOTE — ED Provider Notes (Signed)
[facility] Emergency Department Provider Note    Event Date/Time   First MD Initiated Contact with Patient 07/23/20 1950     (approximate)  I have reviewed the triage vital signs and the nursing notes.   HISTORY  Chief Complaint Shortness of Breath    HPI Shane Latour. is a 75 y.o. male below listed past medical history on chronic home oxygen presents to the ER with worsening shortness of breath.  Feels like is having more frequent cough for the past few days and having to use his nebulizers more frequently.  Not currently on any steroids no recent antibiotics.  No known sick contacts.  Does state that causes a burning sensation when he takes a deep inspiration which is new.  Denies any history of blood clots    Past Medical History:  Diagnosis Date  . Anxiety   . Asthma   . Bradycardia   . CHF (congestive heart failure) ([place])   . Chronic Chest Pain   . COPD (chronic obstructive pulmonary disease) ([place])   . Coronary artery disease    a. 2008 s/p PCI to LAD;  b. 2013 Cath: patent stent->Med Rx;  c. 11/2013 MV: EF 67%, no ischemia/infarct.  . Depression   . Diabetes mellitus without complication ([place])   . GERD (gastroesophageal reflux disease)   . Heart murmur    a. 02/2010 Echo: EF 55-60%, no rwma, Gr 2 DD, triv MR, mildly dil LA, nl RV.  [name] Hypercholesteremia   . Hypertension   . Hypertensive heart disease   . Idiopathic pulmonary fibrosis ([place])    a. Seen by Dr. Stevenson Clinch 01/2015 and Rx Esbriet - pt cannot afford.  . Other social stressor    a. wife with mental illness   Family History  Problem Relation Age of Onset  . Coronary artery disease Mother        s/p CABG  . Heart failure Mother   . Heart disease Other   . Depression Other    Past Surgical History:  Procedure Laterality Date  . CARDIAC CATHETERIZATION    . CORONARY ANGIOPLASTY     stents times 4  . ESOPHAGOGASTRODUODENOSCOPY (EGD) WITH PROPOFOL N/A 10/22/2019   Procedure:  ESOPHAGOGASTRODUODENOSCOPY (EGD) WITH PROPOFOL;  Surgeon: Virgel Manifold, MD;  Location: [facility] ENDOSCOPY;  Service: Endoscopy;  Laterality: N/A;  . Esophagus stretch    . EYE SURGERY     Bilateral cataracts  . LUNG BIOPSY    . NASAL ENDOSCOPY N/A 12/21/2016   Procedure: NASAL ENDOSCOPY;  Surgeon: Margaretha Sheffield, MD;  Location: [place];  Service: ENT;  Laterality: N/A;  . TESTICLE SURGERY    . VIDEO ASSISTED THORACOSCOPY (VATS)/THOROCOTOMY Right 11/18/2014   Procedure: VIDEO ASSISTED THORACOSCOPY (VATS)/ wedge resection biopsy;  Surgeon: Nestor Lewandowsky, MD;  Location: [facility] ORS;  Service: General;  Laterality: Right;   Patient Active Problem List   Diagnosis Date Noted  . CAP (community acquired pneumonia) 03/14/2020  . Dysphagia   . Stomach irritation   . Acute on chronic respiratory failure with hypoxia ([place]) 07/22/2019  . Sepsis ([place]) 07/22/2019  . Community acquired pneumonia 07/22/2019  . COPD with acute exacerbation ([place]) 07/22/2019  . Type 2 diabetes mellitus without complication ([place]) 15/17/6160  . Neuralgia, post-herpetic 03/11/2019  . Hypertension 04/09/2018  . Pain in limb 04/09/2018  . Chronic rhinitis 06/27/2017  . History of nasal surgery 06/27/2017  . 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 Benign localized hyperplasia of prostate with urinary  obstruction 03/01/2017  . Incomplete emptying of bladder 03/01/2017  . Retention of urine, unspecified 03/01/2017  . Scrotal pain 09/11/2016  . Oropharyngeal dysphagia 09/21/2015  . Hypertensive heart disease   . Chronic Chest Pain   . Ischemic heart disease   . Hypercholesteremia   . 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 IPF (idiopathic pulmonary fibrosis) ([place]) 12/23/2014  . Pre-operative cardiovascular examination 11/05/2014  . ILD (interstitial lung disease) ([place]) 09/04/2014  . Interstitial lung disease ([place]) 06/22/2014  . Upper respiratory infection 12/03/2013  . Peyronie's  disease 11/12/2010  . Cough 07/28/2010  . CAROTID BRUIT, LEFT 05/12/2010  . Hyperlipidemia 07/01/2009  . CAD, NATIVE VESSEL 07/01/2009  . SHORTNESS OF BREATH 07/01/2009  . Atypical chest pain 07/01/2009  . Disorder of male genital organs 07/13/2008  . Bradycardia 11/04/2007  . Family history of early CAD 01/23/2007  . Anxiety and depression 02/02/2005      Prior to Admission medications   Medication Sig Start Date End Date Taking? Authorizing Provider  Accu-Chek Softclix Lancets lancets USE AS DIRECTED EVERY DAY 04/12/19   [provider]  albuterol (PROVENTIL) (2.5 MG/3ML) 0.083% nebulizer solution Take 3 mLs (2.5 mg total) by nebulization every 6 (six) hours as needed. 05/02/12   Minna Merritts, MD  albuterol (VENTOLIN HFA) 108 (90 Base) MCG/ACT inhaler Inhale 2 puffs into the lungs every 4 (four) hours as needed for wheezing. 01/22/06   [provider]  amLODipine (NORVASC) 5 MG tablet Take 1 tablet (5 mg total) by mouth daily. 05/14/20   Minna Merritts, MD  aspirin EC 81 MG tablet Take 81 mg by mouth every morning. 08/06/12   [provider]  benzonatate (TESSALON) 100 MG capsule Take 100 mg by mouth 3 (three) times daily. 02/27/20   [provider]  Blood Glucose Monitoring Suppl (ACCU-CHEK GUIDE ME) w/Device KIT DX E11.9 (MEDICAID PREFERRED) 04/11/19   [provider]  cefdinir (OMNICEF) 300 MG capsule Take 1 capsule (300 mg total) by mouth 2 (two) times daily. 03/15/20   Patrecia Pour, MD  diclofenac Sodium (VOLTAREN) 1 % GEL Apply 1 application topically 4 (four) times daily.  11/07/19   [provider]  HYDROcodone-acetaminophen (NORCO/VICODIN) 5-325 MG tablet Take 1 tablet by mouth every 4 (four) hours as needed for moderate pain. 02/24/20   [name], Charline Bills, PA-C  lactulose (CHRONULAC) 10 GM/15ML solution Take 30 mLs (20 g total) by mouth daily as needed for mild constipation. 12/28/19   Paulette Blanch, MD  meloxicam  (MOBIC) 7.5 MG tablet Take 1 tablet (7.5 mg total) by mouth daily. 02/24/20 02/23/21  [name], Charline Bills, PA-C  metFORMIN (GLUCOPHAGE) 1000 MG tablet Take 1,000 mg by mouth 2 (two) times daily with a meal.    [provider]  Multiple Vitamins-Minerals (CENTRUM SILVER PO) Take 1 tablet by mouth daily.     [provider]  nitroGLYCERIN (NITROSTAT) 0.4 MG SL tablet Place 1 tablet (0.4 mg total) under the tongue every 5 (five) minutes as needed. 07/30/17   Minna Merritts, MD  pantoprazole (PROTONIX) 40 MG tablet Take 1 tablet (40 mg total) by mouth daily. 11/10/19   Virgel Manifold, MD  predniSONE (DELTASONE) 20 MG tablet Take 1 tablet (20 mg total) by mouth daily with breakfast. 03/15/20   Patrecia Pour, MD  pregabalin (LYRICA) 50 MG capsule Take 100 mg by mouth 2 (two) times daily.  08/13/19   [provider]  rosuvastatin (CRESTOR) 10 MG tablet TAKE 1 TABLET (10 MG TOTAL) BY MOUTH DAILY. 05/14/20   Minna Merritts, MD    Allergies Ofev [nintedanib], Metoprolol succinate, Niacin, and Sertraline hcl    Social History Social History   Tobacco Use  . Smoking status: Former Smoker    Packs/day: 0.50    Years: 1.00    Pack years: 0.50    Types: Cigarettes    Quit date: 05/09/1968    Years since quitting: 52.2  . Smokeless tobacco: Never Used  Vaping Use  . Vaping Use: Never used  Substance Use Topics  . Alcohol use: No  . Drug use: No    Review of Systems Patient denies headaches, rhinorrhea, blurry vision, numbness, shortness of breath, chest pain, edema, cough, abdominal pain, nausea, vomiting, diarrhea, dysuria, fevers, rashes or hallucinations unless otherwise stated above in HPI. ____________________________________________   PHYSICAL EXAM:  VITAL SIGNS: Vitals:   07/23/20 1946 07/23/20 2030  BP:  119/83  Pulse: 87 77  Resp:  (!) 22  Temp:    SpO2:  99%    Constitutional: Alert and oriented. Chronically ill appearing Eyes:  Conjunctivae are normal.  Head: Atraumatic. Nose: No congestion/rhinnorhea. Mouth/Throat: Mucous membranes are moist.   Neck: No stridor. Painless ROM.  Cardiovascular: Normal rate, regular rhythm. Grossly normal heart sounds.  Good peripheral circulation. Respiratory: Tachypnea: Diffuse crackles throughout.  Requiring supplemental oxygen Gastrointestinal: Soft and nontender. No distention. No abdominal bruits. No CVA tenderness. Genitourinary:  Musculoskeletal: No lower extremity tenderness nor edema.  No joint effusions. Neurologic:  Normal speech and language. No gross focal neurologic deficits are appreciated. No facial droop Skin:  Skin is warm, dry and intact. No rash noted. Psychiatric: Mood and affect are normal. Speech and behavior are normal.  ____________________________________________   LABS (all labs ordered are listed, but only abnormal results are displayed)  Results for orders placed or performed during the hospital encounter of 07/23/20 (from the past 24 hour(s))  CBC with Differential     Status: Abnormal   Collection Time: 07/23/20  7:19 PM  Result Value Ref Range   WBC 11.5 (H) 4.0 - 10.5 K/uL   RBC 4.47 4.22 - 5.81 MIL/uL   Hemoglobin 12.4 (L) 13.0 - 17.0 g/dL   HCT 37.3 (L) 39.0 - 52.0 %   MCV 83.4 80.0 - 100.0 fL   MCH 27.7 26.0 - 34.0 pg   MCHC 33.2 30.0 - 36.0 g/dL   RDW 13.5 11.5 - 15.5 %   Platelets 291 150 - 400 K/uL   nRBC 0.0 0.0 - 0.2 %   Neutrophils Relative % 73 %   Neutro Abs 8.4 (H) 1.7 - 7.7 K/uL   Lymphocytes Relative 15 %   Lymphs Abs 1.7 0.7 - 4.0 K/uL   Monocytes Relative 10 %   Monocytes Absolute 1.1 (H) 0.1 - 1.0 K/uL   Eosinophils Relative 2 %   Eosinophils Absolute 0.2 0.0 - 0.5 K/uL   Basophils Relative 0 %   Basophils Absolute 0.0 0.0 - 0.1 K/uL   Immature Granulocytes 0 %   Abs Immature Granulocytes 0.04 0.00 - 0.07 K/uL  Comprehensive metabolic panel     Status: Abnormal   Collection Time: 07/23/20  7:19 PM  Result Value  Ref Range   Sodium 138 135 - 145 mmol/L   Potassium 3.8 3.5 - 5.1 mmol/L   Chloride 103 98 - 111 mmol/L   CO2 25 22 - 32 mmol/L   Glucose, Bld 140 (H)  70 - 99 mg/dL   BUN 13 8 - 23 mg/dL   Creatinine, Ser 0.89 0.61 - 1.24 mg/dL   Calcium 9.0 8.9 - 10.3 mg/dL   Total Protein 7.8 6.5 - 8.1 g/dL   Albumin 3.6 3.5 - 5.0 g/dL   AST 22 15 - 41 U/L   ALT 11 0 - 44 U/L   Alkaline Phosphatase 62 38 - 126 U/L   Total Bilirubin 1.2 0.3 - 1.2 mg/dL   GFR, Estimated >60 >60 mL/min   Anion gap 10 5 - 15  Troponin I (High Sensitivity)     Status: None   Collection Time: 07/23/20  7:19 PM  Result Value Ref Range   Troponin I (High Sensitivity) 5 <18 ng/L   ____________________________________________  EKG My review and personal interpretation at Time: 19:19   Indication: sob  Rate: 80  Rhythm: sinus Axis: normal Other: motion artifact,  ____________________________________________  RADIOLOGY  I personally reviewed all radiographic images ordered to evaluate for the above acute complaints and reviewed radiology reports and findings.  These findings were personally discussed with the patient.  Please see medical record for radiology report.  ____________________________________________   PROCEDURES  Procedure(s) performed:  Procedures    Critical Care performed: no ____________________________________________   INITIAL IMPRESSION / ASSESSMENT AND PLAN / ED COURSE  Pertinent labs & imaging results that were available during my care of the patient were reviewed by me and considered in my medical decision making (see chart for details).   DDX: Asthma, copd, CHF, pna, ptx, malignancy, Pe, anemia   Shane Geier. is a 75 y.o. who presents to the ED with history of pulmonary fibrosis presenting to the ER for worsening shortness of breath and cough.  Does describe burning sensation when he takes deep inspiration.  No measured fevers at home but feeling like he is having more  frequent cough.  No recent steroids.  Does take nebulizers at home.  States that he gets some improvement only lasts a few moments.  This may be exacerbation of underlying fibrosis or COPD given his pleuritic chest pain worsening shortness of breath history will order CTA to rule out PE.  Will check for Covid.  Patient be signed out to oncoming physician pending CT imaging and reassessment  Clinical Course as of 07/23/20 2058  Fri Jul 23, 2020  2026 RDW: 13.5 [PR]    Clinical Course User Index [PR] Merlyn Lot, MD    The patient was evaluated in Emergency Department today for the symptoms described in the history of present illness. He/she was evaluated in the context of the global COVID-19 pandemic, which necessitated consideration that the patient might be at risk for infection with the SARS-CoV-2 virus that causes COVID-19. Institutional protocols and algorithms that pertain to the evaluation of patients at risk for COVID-19 are in a state of rapid change based on information released by regulatory bodies including the CDC and federal and state organizations. These policies and algorithms were followed during the patient's care in the ED.  As part of my medical decision making, I reviewed the following data within the [place] notes reviewed and incorporated, Labs reviewed, notes from prior ED visits and [place] Controlled Substance Database   ____________________________________________   FINAL CLINICAL IMPRESSION(S) / ED DIAGNOSES  Final diagnoses:  SOB (shortness of breath)      NEW MEDICATIONS STARTED DURING THIS VISIT:  New Prescriptions   No medications on file  Note:  This document was prepared using Dragon voice recognition software and may include unintentional dictation errors.    Merlyn Lot, MD 07/23/20 [phone]

## 2020-07-23 NOTE — ED Notes (Signed)
ED provider at bedside.

## 2020-07-23 NOTE — ED Triage Notes (Signed)
Pt states he is shob. Pt with history or pulmonary fibrosis. Pt states he has worsening shob. Pt with chronic oxygen use 3lpm at home. Pt is able to speak in full sentences, complains of "lung pain".

## 2020-07-24 ENCOUNTER — Encounter: Payer: Self-pay | Admitting: Emergency Medicine

## 2020-07-24 ENCOUNTER — Emergency Department
Admission: EM | Admit: 2020-07-24 | Discharge: 2020-07-24 | Disposition: A | Discharge status: Home / Self Care | Payer: Medicare Other | Attending: Emergency Medicine | Admitting: Emergency Medicine

## 2020-07-24 DIAGNOSIS — R0602 Shortness of breath: Secondary | ICD-10-CM | POA: Diagnosis present

## 2020-07-24 DIAGNOSIS — Z5321 Procedure and treatment not carried out due to patient leaving prior to being seen by health care provider: Secondary | ICD-10-CM | POA: Insufficient documentation

## 2020-07-24 NOTE — ED Triage Notes (Signed)
Pt in w/sob, returns after being seen last night. States his home O2 concentrator isn't working. Wears 3LNC home O2. Speaking in full sentences, sats 96% on 3L

## 2020-07-24 NOTE — ED Notes (Addendum)
Pt states is here for shob. Pt with his own pox in place with pox of 78% on ra. Pt did not have his oxygen concentrator on and states he needs oxygen 3lpm all the time. Pox up to 92% once pt placed on oxygen.

## 2020-07-24 NOTE — ED Notes (Signed)
Patient presented back to department after discharge earlier this evening. Pt with end stage pulmonary fibrosis and reported feeling SOB at home. Patient was indecisive on whether or not he wanted to be seen again. Upon getting patient into triage room it was found that patient's portable oxygen compressor was not on and delivering oxygen. Patient is meant to be on 3L [place] at all times. RN turned patient's compressor on and inquired as to what the patient wanted to do. Patient was allowed to sit and catch his breath while waiting to see if with the prescribed home oxygen his symptoms improved. During this trial period it was found that the patient's compressor was malfunctioning and not delivering oxygen to patient. Patient was then placed on hospital tank of oxygen at 3 L. Following this switch patient oxygen saturation improved from 91% to 97% which patient states is his baseline Spo2. Patient breathing became less labored and he was able to speak with greater ease. Patient was again asked if he wanted to continue with the triage process to be seen again. Pt states that he did not want to be seen and would rather go home, "I just wanted to make sure I could breathe." Patient was adamant that he has an oxygen machine at home that is functional and that he just had not connected to it upon returning home following discharge and had instead stayed on his portable compressor.It was repeatedly emphasized to patient that he can check back in for evaluation and pt declined stating he wanted to go home as he was just discharged and did not feel we would do anything different than when he was here before. RN explained to patient that while checking in would mean starting the assessment and treatment process over, RN could not speak to the decision making or potential treatment plan of the physician. Patient verbalized understanding of this and again stated that he wanted to go home. RN conferred with charge RN, ED physician,  and Doylene Canard about appropriate course of action for patient returning home. With permission from all aforementioned, patient left with a provided hospital oxygen tank to ensure pt safety and oxygenation until patient arrival back home where he would then connect to his home oxygen machine.  Patient instructed to return should he feel symptoms worsen and patient verbalized understanding. Patient states he will return hospital oxygen tank tomorrow after receiving replacement portable compressor from company supplier.

## 2020-07-29 ENCOUNTER — Encounter: Payer: Self-pay | Admitting: Nurse Practitioner

## 2020-07-29 ENCOUNTER — Other Ambulatory Visit: Payer: Medicare Other | Admitting: Nurse Practitioner

## 2020-07-29 ENCOUNTER — Other Ambulatory Visit: Payer: Self-pay

## 2020-07-29 DIAGNOSIS — Z515 Encounter for palliative care: Secondary | ICD-10-CM

## 2020-07-29 DIAGNOSIS — J84112 Idiopathic pulmonary fibrosis: Secondary | ICD-10-CM

## 2020-07-29 NOTE — Progress Notes (Signed)
[place] Consult Note Telephone: [phone]  Fax: [phone]  PATIENT NAME: Shane Reid. DOB: 11-03-1945 MRN: [id number]  PRIMARY CARE PROVIDER:   Elba Barman, MD  REFERRING PROVIDER:  Elba Barman, MD [address],  [place] [address]   RESPONSIBLE PARTY:   Self cell phone [phone]  1.Advance Care Planning;Full code with aggressive interventions;ongoing discussions of complex medical decision making including cpr  2. Goals of Care: Goals include to maximize quality of life and symptom management. Our advance care planning conversation included a discussion about:   The value and importance of advance care planning  Exploration of personal, cultural or spiritual beliefs that might influence medical decisions  Exploration of goals of care in the event of a sudden injury or illness  Identification and preparation of a healthcare agent  Review and updating or creation of anadvance directive document.  3.Palliative care encounter; Palliative care encounter; Palliative medicine team will continue to support patient, patient's family, and medical team. Visit consisted of counseling and education dealing with the complex and emotionally intense issues of symptom management and palliative care in the setting of serious and potentially life-threatening illness  4. f/u2weeks for ongoing monitoring of disease progression of pulmonary fibrosis, symptoms of dyspnea, complex medical decision making  5. Dyspnea; continue to use vest, nebulizer, requested in home respiratory therapist for education ILD, continue with Roxanol for severe dyspnea which Dr Loma Newton just started.   I spent 60 minutes providing this consultation,  from 10:30am to 11:30am. More than 50% of the time in this consultation was spent coordinating communication.   HISTORY OF PRESENT ILLNESS:  Shane Reid. is a 75  y.o. year old male with multiple medical problems including Idiopathic pulmonary fibrosis, coronary artery disease s/p stent, COPD, congestive heart failure, dysphasia, hypertension, heart murmur, diabetes, COPD, asthma, chronic chest pain, gerd, anxiety. I called Shane Reid confirmed PC f/u visit, covid negative. I visited Shane Reid. We talked about how he was feeling today. Shane Reid endorses he continues to have more difficulty with shortness of breath. Shane Reid endorses he continues to use nebulizer tid, his vest twice a day. We talked about 2 ED visits. We talked about chronic disease progression ILD. We talked about realistic expectations. We talked about O2 tubing being crimped or O2 concentrator not working correctly. We talked about in home respiratory assessment. Shane Reid in agreement. We talked about medical goals including aggressive, conservative, comfort care. Currently Shane Reid is a full code. Shane Reid endorses he continues to think about code status, he would not want to be on a ventilator. Shane Reid endorses he survived a respiratory arrest in the past. Shane Reid endorses she wants him to have CPR. We talked at length. We talked about appetite being decreased. We talked about supplements Mr Reid is drinking. We talked about newly started roxanol. Shane Reid endorses it really has help him rest when his breathing is severe. We talked about roxanol, education done. We talked about role of PC with f/u visit in 2 weeks after respiratory assessment, order sent in. Shane Reid in agreement, appointment scheduled. Emotional support provided. Questions answered to satisfaction. Contact information provided.   ROS; reviewed all negative except +wheezing; +shortness of breath; +fatigue; +anorexia  Palliative Care was asked to help to continue to address goals of care.   CODE STATUS: Full code  PPS: 50% HOSPICE ELIGIBILITY/DIAGNOSIS: TBD  PAST MEDICAL HISTORY:  Past  Medical History:   Diagnosis Date  . Anxiety   . Asthma   . Bradycardia   . CHF (congestive heart failure) ([place])   . Chronic Chest Pain   . COPD (chronic obstructive pulmonary disease) ([place])   . Coronary artery disease    a. 2008 s/p PCI to LAD;  b. 2013 Cath: patent stent->Med Rx;  c. 11/2013 MV: EF 67%, no ischemia/infarct.  . Depression   . Diabetes mellitus without complication ([place])   . GERD (gastroesophageal reflux disease)   . Heart murmur    a. 02/2010 Echo: EF 55-60%, no rwma, Gr 2 DD, triv MR, mildly dil LA, nl RV.  [name] Hypercholesteremia   . Hypertension   . Hypertensive heart disease   . Idiopathic pulmonary fibrosis ([place])    a. Seen by Dr. Stevenson Clinch 01/2015 and Rx Esbriet - pt cannot afford.  . Other social stressor    a. wife with mental illness    SOCIAL HX:  Social History   Tobacco Use  . Smoking status: Former Smoker    Packs/day: 0.50    Years: 1.00    Pack years: 0.50    Types: Cigarettes    Quit date: 05/09/1968    Years since quitting: 52.2  . Smokeless tobacco: Never Used  Substance Use Topics  . Alcohol use: No    ALLERGIES:  Allergies  Allergen Reactions  . Ofev [Nintedanib]   . Metoprolol Succinate Other (See Comments)    Reaction:  Unknown   . Niacin Other (See Comments)    Reaction:  Unknown   . Sertraline Hcl Other (See Comments)    Reaction:  Unknown      PERTINENT MEDICATIONS:  Outpatient Encounter Medications as of 07/29/2020  Medication Sig  . Accu-Chek Softclix Lancets lancets USE AS DIRECTED EVERY DAY  . albuterol (PROVENTIL) (2.5 MG/3ML) 0.083% nebulizer solution Take 3 mLs (2.5 mg total) by nebulization every 6 (six) hours as needed.  [name] albuterol (VENTOLIN HFA) 108 (90 Base) MCG/ACT inhaler Inhale 2 puffs into the lungs every 4 (four) hours as needed for wheezing.  [name] amLODipine (NORVASC) 5 MG tablet Take 1 tablet (5 mg total) by mouth daily.  [name] aspirin EC 81 MG tablet Take 81 mg by mouth every morning.  . benzonatate (TESSALON) 100 MG capsule Take 1  capsule (100 mg total) by mouth 3 (three) times daily as needed for cough.  . Blood Glucose Monitoring Suppl (ACCU-CHEK GUIDE ME) w/Device KIT DX E11.9 (MEDICAID PREFERRED)  . cefdinir (OMNICEF) 300 MG capsule Take 1 capsule (300 mg total) by mouth 2 (two) times daily.  . diclofenac Sodium (VOLTAREN) 1 % GEL Apply 1 application topically 4 (four) times daily.   [name] HYDROcodone-acetaminophen (NORCO/VICODIN) 5-325 MG tablet Take 1 tablet by mouth every 4 (four) hours as needed for moderate pain.  [name] lactulose (CHRONULAC) 10 GM/15ML solution Take 30 mLs (20 g total) by mouth daily as needed for mild constipation.  . meloxicam (MOBIC) 7.5 MG tablet Take 1 tablet (7.5 mg total) by mouth daily.  . metFORMIN (GLUCOPHAGE) 1000 MG tablet Take 1,000 mg by mouth 2 (two) times daily with a meal.  . Multiple Vitamins-Minerals (CENTRUM SILVER PO) Take 1 tablet by mouth daily.   . nitroGLYCERIN (NITROSTAT) 0.4 MG SL tablet Place 1 tablet (0.4 mg total) under the tongue every 5 (five) minutes as needed.  . pantoprazole (PROTONIX) 40 MG tablet Take 1 tablet (40 mg total) by mouth daily.  . predniSONE (DELTASONE) 20 MG tablet  Take 1 tablet (20 mg total) by mouth daily with breakfast.  . predniSONE (STERAPRED UNI-PAK 21 TAB) 10 MG (21) TBPK tablet Per packaging instructions  . pregabalin (LYRICA) 50 MG capsule Take 100 mg by mouth 2 (two) times daily.   . rosuvastatin (CRESTOR) 10 MG tablet TAKE 1 TABLET (10 MG TOTAL) BY MOUTH DAILY.   No facility-administered encounter medications on file as of 07/29/2020.    PHYSICAL EXAM:   General:  frail appearing, thin, pleasant male Cardiovascular: regular rate and rhythm Pulmonary: clear ant fields Neurological: ambulatory  Jadon Ressler Ihor Gully, NP

## 2020-08-10 ENCOUNTER — Encounter: Payer: Self-pay | Admitting: Nurse Practitioner

## 2020-08-10 ENCOUNTER — Other Ambulatory Visit: Payer: Medicare Other | Admitting: Nurse Practitioner

## 2020-08-10 ENCOUNTER — Other Ambulatory Visit: Payer: Self-pay

## 2020-08-10 DIAGNOSIS — Z515 Encounter for palliative care: Secondary | ICD-10-CM

## 2020-08-10 DIAGNOSIS — J84112 Idiopathic pulmonary fibrosis: Secondary | ICD-10-CM

## 2020-08-10 NOTE — Progress Notes (Signed)
[facility] Consult Note Telephone: [phone]  Fax: [phone]  PATIENT NAME: Shane Reid. DOB: Nov 30, 1945 MRN: [id number]  PRIMARY CARE PROVIDER:   Elba Barman, MD  REFERRING PROVIDER:  Elba Barman, MD [place],  [place] [address]   RESPONSIBLE PARTY:Self cell [id number]  0.Advance Care Planning;Full code with aggressive interventions;ongoing discussions of complex medical decision making including cpr  2. Goals of Care: Goals include to maximize quality of life and symptom management. Our advance care planning conversation included a discussion about:   The value and importance of advance care planning  Exploration of personal, cultural or spiritual beliefs that might influence medical decisions  Exploration of goals of care in the event of a sudden injury or illness  Identification and preparation of a healthcare agent  Review and updating or creation of anadvance directive document.  3.Palliative care encounter; Palliative care encounter; Palliative medicine team will continue to support patient, patient's family, and medical team. Visit consisted of counseling and education dealing with the complex and emotionally intense issues of symptom management and palliative care in the setting of serious and potentially life-threatening illness  4. f/u2weeks for ongoing monitoring of disease progression of pulmonary fibrosis, symptoms of dyspnea, complex medical decision making  5. Dyspnea; continue to use vest, nebulizer, requested in home respiratory therapist for education ILD, continue with Roxanol for severe dyspnea which has been effective relief.   I spent 60 minutes providing this consultation,  from 12:00pm to 1:00pm. More than 50% of the time in this consultation was spent coordinating communication.   HISTORY OF PRESENT ILLNESS:  Shane Reid. is a 75  y.o. year old male with multiple medical problems including Idiopathic pulmonary fibrosis, coronary artery disease s/p stent, COPD, congestive heart failure, dysphasia, hypertension, heart murmur, diabetes, COPD, asthma, chronic chest pain, gerd, anxiety.I called Shane Reid confirmed PC f/u visit, covid negative.  Shane Reid and I meant in his home. Shane Reid does continue to ambulate though becomes very short of breath and drops his oxygen levels to the 80s and 70s when ambulating. Shane Reid does remain on oxygen. We talked about symptoms of pain which currently is not experiencing. We talked about shortness of breath. We talked about shortness of breath with exertion, eating, ambulating, speaking. Shane Reid endorses the roxanol does help and he typically uses one dose every few days. We talked about nebulizer treatments. Shane Reid endorses he no longer wants to continue to see specialist. We talked about chronic disease progression of interstitial lung disease. We talked about realistic expectations. We talked about code status on going to discussion as he currently is a full code. Shane Reid endorses he wishes not to be although family including his wife Verdis Frederickson insist on him being a full code. We talked about wishes for Comfort at home. We talked about quality of life. We talked about option of Hospice Services Under Medicare benefit and what would be provided. Shane Reid and Shane Reid both in agreement to be open to Hospice Services at this time. Hospice Physicians to review case. Discuss with Shane Reid will contact his granddaughter Aldona Bar for update on plan and palliative care visit. Therapeutic listening, emotional support provided. Contact information. Questions answered to satisfaction  Palliative Care was asked to help address goals of care.   CODE STATUS: full code  PPS: 40% HOSPICE ELIGIBILITY/DIAGNOSIS: TBD  PAST MEDICAL HISTORY:  Past Medical History:  Diagnosis Date  .  Anxiety   .  Asthma   . Bradycardia   . CHF (congestive heart failure) ([facility])   . Chronic Chest Pain   . COPD (chronic obstructive pulmonary disease) ([facility])   . Coronary artery disease    a. 2008 s/p PCI to LAD;  b. 2013 Cath: patent stent->Med Rx;  c. 11/2013 MV: EF 67%, no ischemia/infarct.  . Depression   . Diabetes mellitus without complication ([facility])   . GERD (gastroesophageal reflux disease)   . Heart murmur    a. 02/2010 Echo: EF 55-60%, no rwma, Gr 2 DD, triv Shane, mildly dil LA, nl RV.  [name] Hypercholesteremia   . Hypertension   . Hypertensive heart disease   . Idiopathic pulmonary fibrosis ([facility])    a. Seen by Dr. Stevenson Clinch 01/2015 and Rx Esbriet - pt cannot afford.  . Other social stressor    a. wife with mental illness    SOCIAL HX:  Social History   Tobacco Use  . Smoking status: Former Smoker    Packs/day: 0.50    Years: 1.00    Pack years: 0.50    Types: Cigarettes    Quit date: 05/09/1968    Years since quitting: 52.2  . Smokeless tobacco: Never Used  Substance Use Topics  . Alcohol use: No    ALLERGIES:  Allergies  Allergen Reactions  . Ofev [Nintedanib]   . Metoprolol Succinate Other (See Comments)    Reaction:  Unknown   . Niacin Other (See Comments)    Reaction:  Unknown   . Sertraline Hcl Other (See Comments)    Reaction:  Unknown      PERTINENT MEDICATIONS:  Outpatient Encounter Medications as of 08/10/2020  Medication Sig  . Accu-Chek Softclix Lancets lancets USE AS DIRECTED EVERY DAY  . albuterol (PROVENTIL) (2.5 MG/3ML) 0.083% nebulizer solution Take 3 mLs (2.5 mg total) by nebulization every 6 (six) hours as needed.  [name] albuterol (VENTOLIN HFA) 108 (90 Base) MCG/ACT inhaler Inhale 2 puffs into the lungs every 4 (four) hours as needed for wheezing.  [name] amLODipine (NORVASC) 5 MG tablet Take 1 tablet (5 mg total) by mouth daily.  [name] aspirin EC 81 MG tablet Take 81 mg by mouth every morning.  . benzonatate (TESSALON) 100 MG capsule Take 1 capsule (100 mg total) by mouth  3 (three) times daily as needed for cough.  . Blood Glucose Monitoring Suppl (ACCU-CHEK GUIDE ME) w/Device KIT DX E11.9 (MEDICAID PREFERRED)  . cefdinir (OMNICEF) 300 MG capsule Take 1 capsule (300 mg total) by mouth 2 (two) times daily.  . diclofenac Sodium (VOLTAREN) 1 % GEL Apply 1 application topically 4 (four) times daily.   [name] HYDROcodone-acetaminophen (NORCO/VICODIN) 5-325 MG tablet Take 1 tablet by mouth every 4 (four) hours as needed for moderate pain.  [name] lactulose (CHRONULAC) 10 GM/15ML solution Take 30 mLs (20 g total) by mouth daily as needed for mild constipation.  . meloxicam (MOBIC) 7.5 MG tablet Take 1 tablet (7.5 mg total) by mouth daily.  . metFORMIN (GLUCOPHAGE) 1000 MG tablet Take 1,000 mg by mouth 2 (two) times daily with a meal.  . Multiple Vitamins-Minerals (CENTRUM SILVER PO) Take 1 tablet by mouth daily.   . nitroGLYCERIN (NITROSTAT) 0.4 MG SL tablet Place 1 tablet (0.4 mg total) under the tongue every 5 (five) minutes as needed.  . pantoprazole (PROTONIX) 40 MG tablet Take 1 tablet (40 mg total) by mouth daily.  . predniSONE (DELTASONE) 20 MG tablet Take 1 tablet (20 mg total) by  mouth daily with breakfast.  . predniSONE (STERAPRED UNI-PAK 21 TAB) 10 MG (21) TBPK tablet Per packaging instructions  . pregabalin (LYRICA) 50 MG capsule Take 100 mg by mouth 2 (two) times daily.   . rosuvastatin (CRESTOR) 10 MG tablet TAKE 1 TABLET (10 MG TOTAL) BY MOUTH DAILY.   No facility-administered encounter medications on file as of 08/10/2020.    PHYSICAL EXAM:   General: chronically ill, pleasant, frail appearing, thin Cardiovascular: regular rate and rhythm Pulmonary: poor air movement Extremities: no edema, no joint deformities; muscle wasting Neurological: ambulatory  Christin Ihor Gully, NP

## 2020-08-12 ENCOUNTER — Other Ambulatory Visit: Payer: Self-pay

## 2020-08-12 ENCOUNTER — Encounter: Payer: Self-pay | Admitting: Nurse Practitioner

## 2020-08-12 ENCOUNTER — Other Ambulatory Visit: Payer: Medicare Other | Admitting: Nurse Practitioner

## 2020-08-12 DIAGNOSIS — Z515 Encounter for palliative care: Secondary | ICD-10-CM

## 2020-08-12 DIAGNOSIS — J84112 Idiopathic pulmonary fibrosis: Secondary | ICD-10-CM

## 2020-08-12 NOTE — Progress Notes (Signed)
[facility] Consult Note Telephone: [phone]  Fax: [phone]  PATIENT NAME: Shane Reid. DOB: 08/08/1945 MRN: [id number]  PRIMARY CARE PROVIDER:   Elba Barman, MD  REFERRING PROVIDER:  Elba Barman, MD [place],  [place] [address]  RESPONSIBLE PARTY:Self cell [id number]  1.Advance Care Planning;Full code with aggressive interventions;ongoing discussions of complex medical decision making including cpr; continue with Hospice admission visit  2. Goals of Care: Goals include to maximize quality of life and symptom management. Our advance care planning conversation included a discussion about:   The value and importance of advance care planning  Exploration of personal, cultural or spiritual beliefs that might influence medical decisions  Exploration of goals of care in the event of a sudden injury or illness  Identification and preparation of a healthcare agent  Review and updating or creation of anadvance directive document.  3.Palliative care encounter; Palliative care encounter; Palliative medicine team will continue to support patient, patient's family, and medical team. Visit consisted of counseling and education dealing with the complex and emotionally intense issues of symptom management and palliative care in the setting of serious and potentially life-threatening illness  I spent 35 minutes providing this consultation,  from 10:00am to 10:35am. More than 50% of the time in this consultation was spent coordinating communication.   HISTORY OF PRESENT ILLNESS:  Shane Fluharty. is a 75 y.o. year old male with multiple medical problems including Idiopathic pulmonary fibrosis, coronary artery disease s/p stent, COPD, congestive heart failure, dysphasia, hypertension, heart murmur, diabetes, COPD, asthma, chronic chest pain, gerd, anxiety.I called Shane Reid confirmed  PC f/u visit, covid negative. Palliative care visit with Hospice AV RN to introduce Mr and Mrs. Windish to Hospice services. Attempted to contact granddaughter Shane Reid, message left as she was suppose to be at the visit. Shane Reid has mental health with severe debility. Shane Reid, Reid and I talked about purpose of visit, explained purpose for Hospice for Shane Reid. Mr Reid was in his room at the time. Shane Reid is currently a PC patient as well. We talked about Hospice services provided. We met with Shane Reid, talked about medical goals of care, what services are provided, symptoms, medications, chronic disease progression, duonebs, percussion vest. Therapeutic listening, emotional support provided. Shane Reid continue to agree with Hospice services, continued with admission. Questions answered.   Palliative Care was asked to help to continue to address goals of care.   CODE STATUS: Full code  PPS: 40% HOSPICE ELIGIBILITY/DIAGNOSIS: TBD  PAST MEDICAL HISTORY:  Past Medical History:  Diagnosis Date  . Anxiety   . Asthma   . Bradycardia   . CHF (congestive heart failure) ([facility])   . Chronic Chest Pain   . COPD (chronic obstructive pulmonary disease) ([facility])   . Coronary artery disease    a. 2008 s/p PCI to LAD;  b. 2013 Cath: patent stent->Med Rx;  c. 11/2013 MV: EF 67%, no ischemia/infarct.  . Depression   . Diabetes mellitus without complication ([facility])   . GERD (gastroesophageal reflux disease)   . Heart murmur    a. 02/2010 Echo: EF 55-60%, no rwma, Gr 2 DD, triv MR, mildly dil LA, nl RV.  [name] Hypercholesteremia   . Hypertension   . Hypertensive heart disease   . Idiopathic pulmonary fibrosis ([facility])    a. Seen by Dr. Stevenson Clinch 01/2015 and Rx Esbriet - pt cannot afford.  [name]  Other social stressor    a. wife with mental illness    SOCIAL HX:  Social History   Tobacco Use  . Smoking status: Former Smoker    Packs/day: 0.50    Years: 1.00    Pack years: 0.50     Types: Cigarettes    Quit date: 05/09/1968    Years since quitting: 52.2  . Smokeless tobacco: Never Used  Substance Use Topics  . Alcohol use: No    ALLERGIES:  Allergies  Allergen Reactions  . Ofev [Nintedanib]   . Metoprolol Succinate Other (See Comments)    Reaction:  Unknown   . Niacin Other (See Comments)    Reaction:  Unknown   . Sertraline Hcl Other (See Comments)    Reaction:  Unknown      PERTINENT MEDICATIONS:  Outpatient Encounter Medications as of 08/12/2020  Medication Sig  . Accu-Chek Softclix Lancets lancets USE AS DIRECTED EVERY DAY  . albuterol (PROVENTIL) (2.5 MG/3ML) 0.083% nebulizer solution Take 3 mLs (2.5 mg total) by nebulization every 6 (six) hours as needed.  [name] albuterol (VENTOLIN HFA) 108 (90 Base) MCG/ACT inhaler Inhale 2 puffs into the lungs every 4 (four) hours as needed for wheezing.  [name] amLODipine (NORVASC) 5 MG tablet Take 1 tablet (5 mg total) by mouth daily.  [name] aspirin EC 81 MG tablet Take 81 mg by mouth every morning.  . benzonatate (TESSALON) 100 MG capsule Take 1 capsule (100 mg total) by mouth 3 (three) times daily as needed for cough.  . Blood Glucose Monitoring Suppl (ACCU-CHEK GUIDE ME) w/Device KIT DX E11.9 (MEDICAID PREFERRED)  . cefdinir (OMNICEF) 300 MG capsule Take 1 capsule (300 mg total) by mouth 2 (two) times daily.  . diclofenac Sodium (VOLTAREN) 1 % GEL Apply 1 application topically 4 (four) times daily.   [name] HYDROcodone-acetaminophen (NORCO/VICODIN) 5-325 MG tablet Take 1 tablet by mouth every 4 (four) hours as needed for moderate pain.  [name] lactulose (CHRONULAC) 10 GM/15ML solution Take 30 mLs (20 g total) by mouth daily as needed for mild constipation.  . meloxicam (MOBIC) 7.5 MG tablet Take 1 tablet (7.5 mg total) by mouth daily.  . metFORMIN (GLUCOPHAGE) 1000 MG tablet Take 1,000 mg by mouth 2 (two) times daily with a meal.  . Multiple Vitamins-Minerals (CENTRUM SILVER PO) Take 1 tablet by mouth daily.   . nitroGLYCERIN  (NITROSTAT) 0.4 MG SL tablet Place 1 tablet (0.4 mg total) under the tongue every 5 (five) minutes as needed.  . pantoprazole (PROTONIX) 40 MG tablet Take 1 tablet (40 mg total) by mouth daily.  . predniSONE (DELTASONE) 20 MG tablet Take 1 tablet (20 mg total) by mouth daily with breakfast.  . predniSONE (STERAPRED UNI-PAK 21 TAB) 10 MG (21) TBPK tablet Per packaging instructions  . pregabalin (LYRICA) 50 MG capsule Take 100 mg by mouth 2 (two) times daily.   . rosuvastatin (CRESTOR) 10 MG tablet TAKE 1 TABLET (10 MG TOTAL) BY MOUTH DAILY.   No facility-administered encounter medications on file as of 08/12/2020.    PHYSICAL EXAM:   General: Chronically ill, O2 dependent, anxious, frail appearing, thin male Extremities: no edema, no joint deformities; muscle wasting Neurological: ambulatory  Christin Ihor Gully, NP

## 2020-09-04 IMAGING — RF DG ESOPHAGUS
11 series · 14 of 24 positions shown · non-contrast
Comparison: CT chest 07/07/2019.

CLINICAL DATA: Dysphagia, unspecified type.

EXAM:
ESOPHOGRAM / BARIUM SWALLOW / BARIUM TABLET STUDY
TECHNIQUE: Combined double contrast and single contrast examination performed
using effervescent crystals, thick barium liquid, and thin barium
liquid. The patient was observed with fluoroscopy swallowing a 13 mm
barium sulphate tablet.
FLUOROSCOPY TIME:  Fluoroscopy Time:  1 minutes 24 seconds
Radiation Exposure Index (if provided by the fluoroscopic device):
32.1 mGy

[Series 1: fluoro_barium 2fps_bw · 0.17mm/px · 2 of 9 frames shown (1 of 9)]
[frame 2/9]
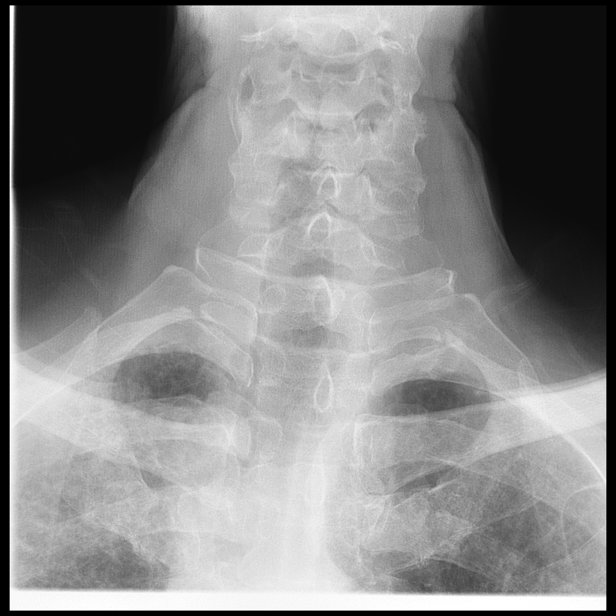
[frame 8/9]
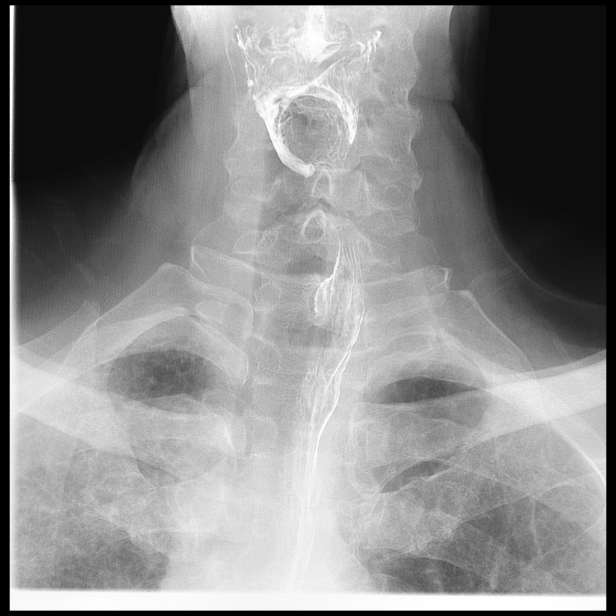

[Series 2: fluoro_barium 2fps_bw · 0.17mm/px · 1 of 7 frames shown (2 of 9)]
[frame 3/7]
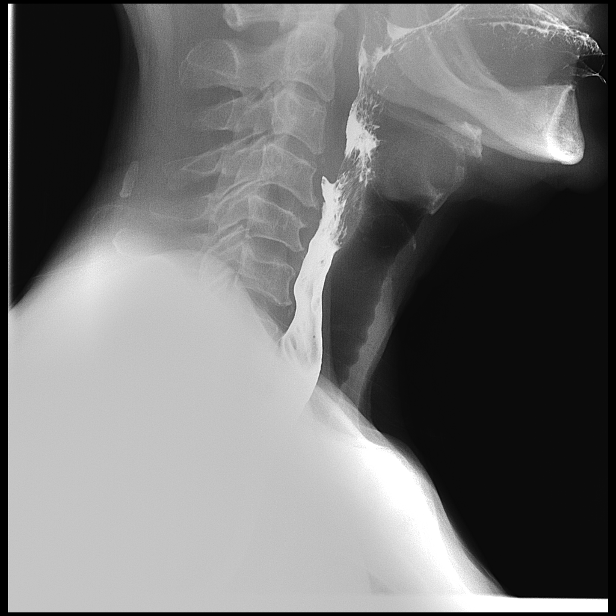

[Series 3: fluoro_barium 2fps_bw · 0.17mm/px · 1 of 1 slices shown (3 of 9)]
[im 1/1]
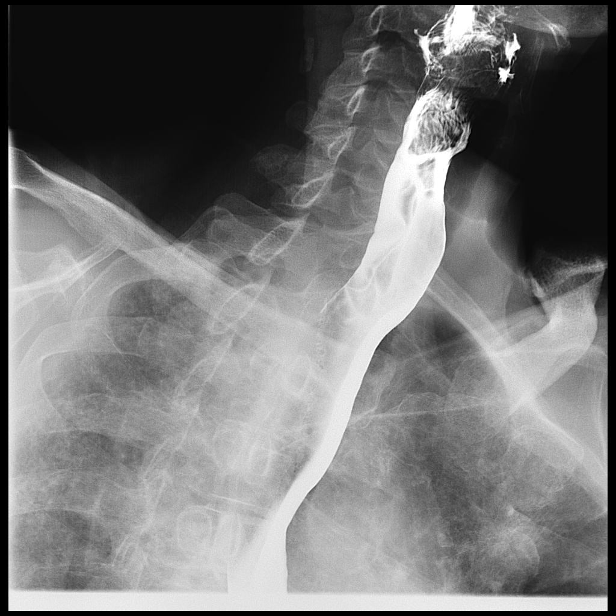

[Series 4: fluoro_barium 2fps_bw · 0.17mm/px · 1 of 1 slices shown (4 of 9)]
[im 1/1]
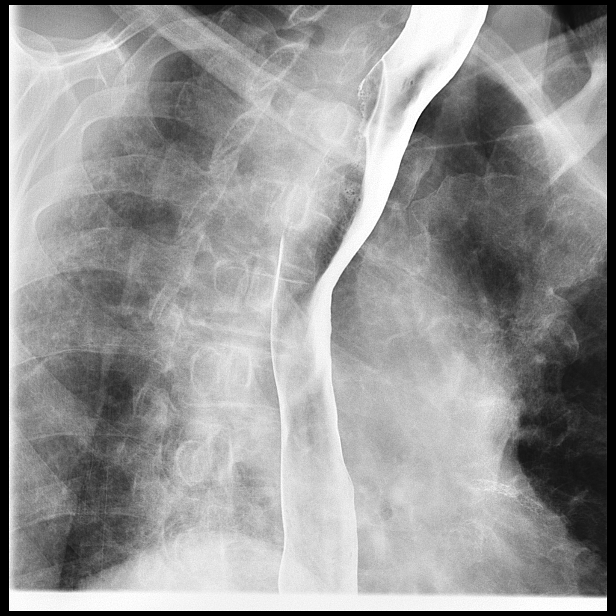

[Series 5: fluoro_barium 2fps_bw · 0.17mm/px · 1 of 2 frames shown (5 of 9)]
[frame 2/2]
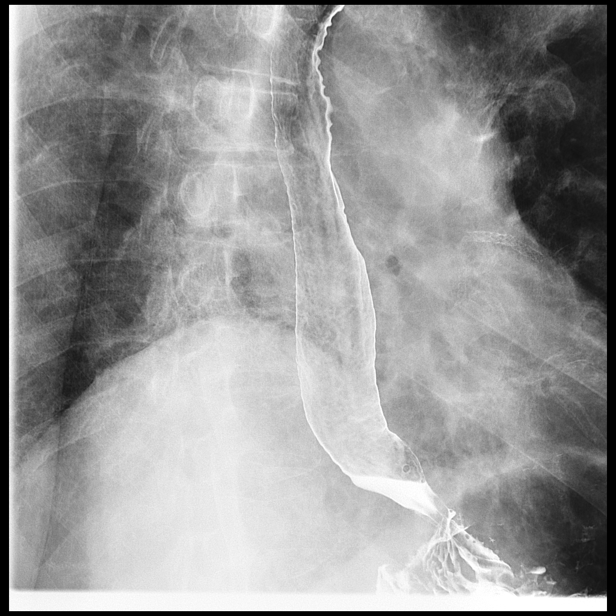

[Series 7: fluoro_barium 2fps_bw · 0.18mm/px · 2 of 6 frames shown (6 of 9)]
[frame 3/6]
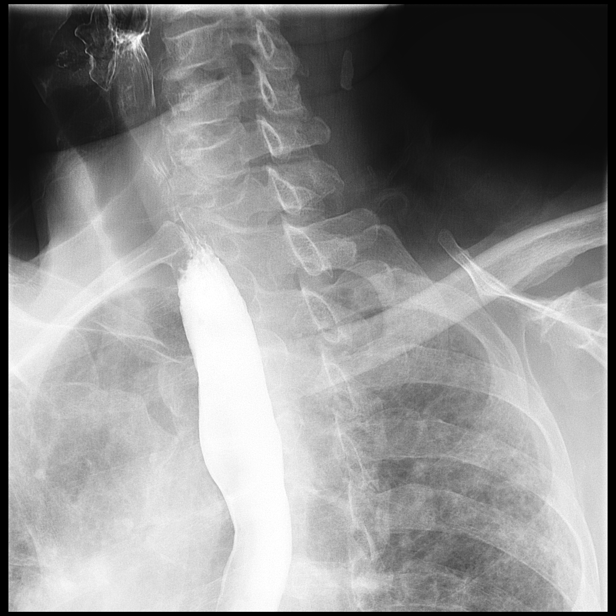
[frame 4/6]
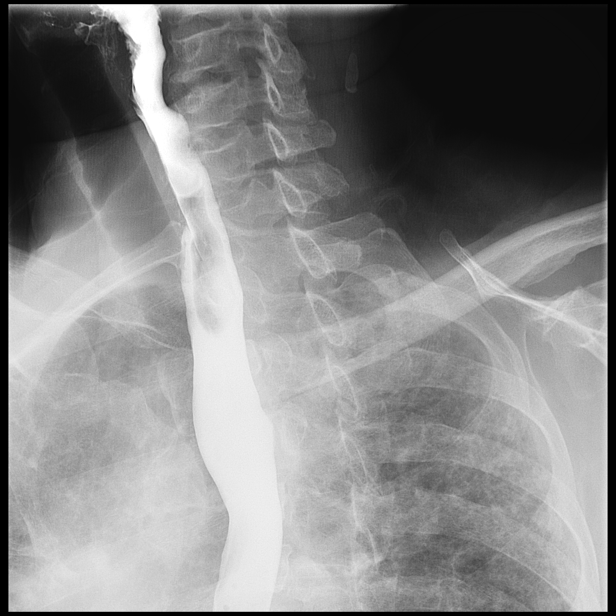

[Series 9: fluoro_barium 2fps_bw · 0.18mm/px · 1 of 1 slices shown (7 of 9)]
[im 1/1]
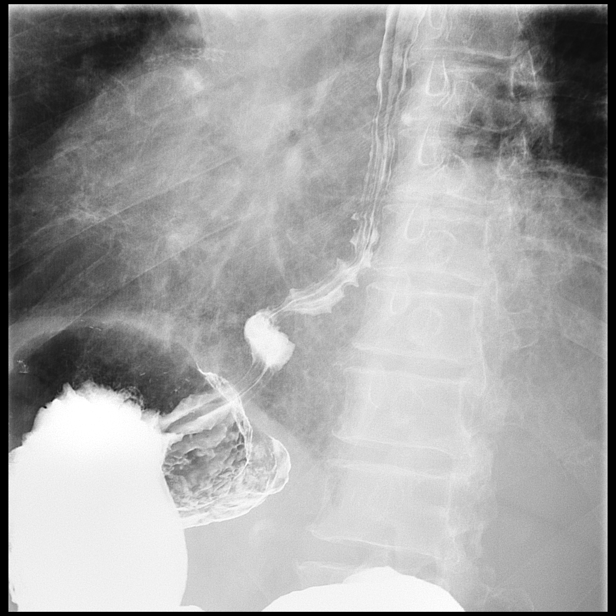

[Series 10: fluoro_barium 2fps_bw · 0.18mm/px · 1 of 9 frames shown (8 of 9)]
[frame 5/9]
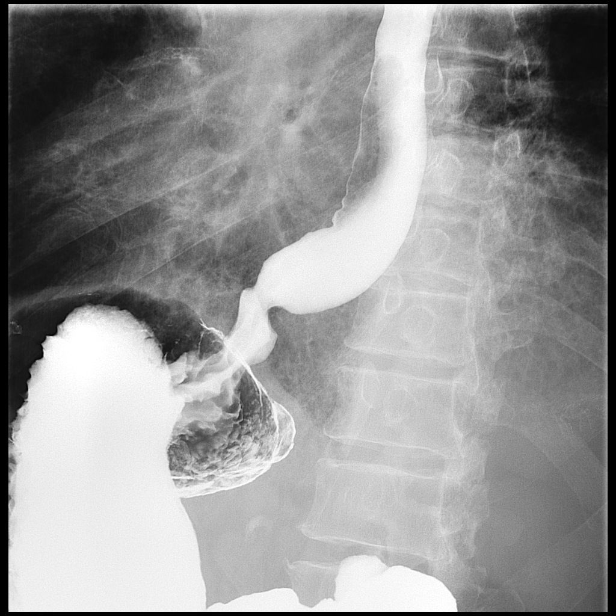

[Series 11: fluoro_barium 2fps_bw · 0.18mm/px · 2 of 7 frames shown (9 of 9)]
[frame 2/7]
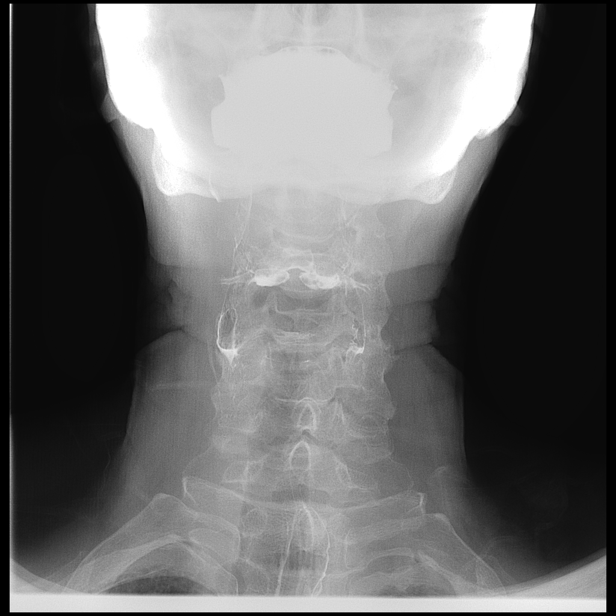
[frame 4/7]
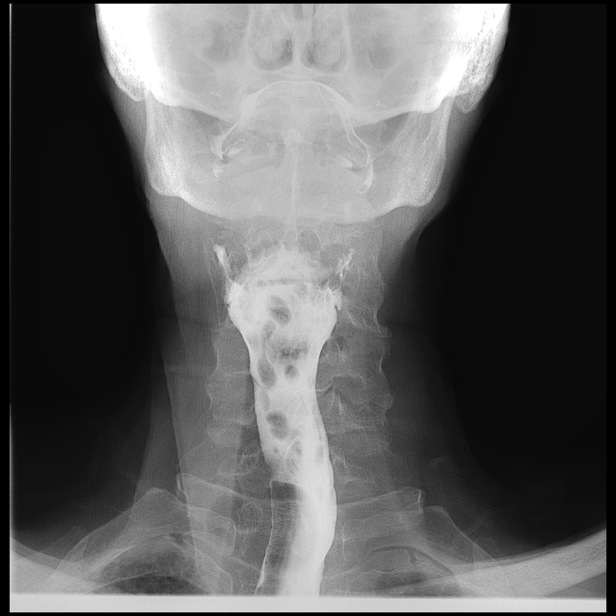

[Series 12: cp_standard · 0.18mm/px · 1 of 1 slices shown (1 of 2)]
[im 1/1]
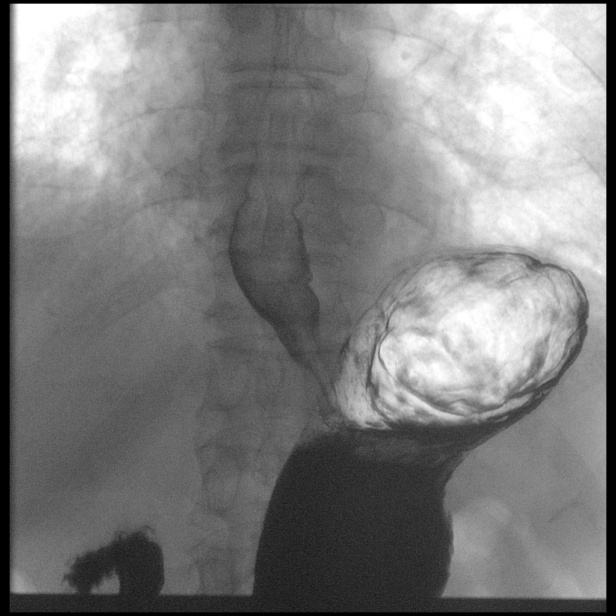

[Series 13: cp_standard · 0.18mm/px · 1 of 5 frames shown (2 of 2)]
[frame 5/5]
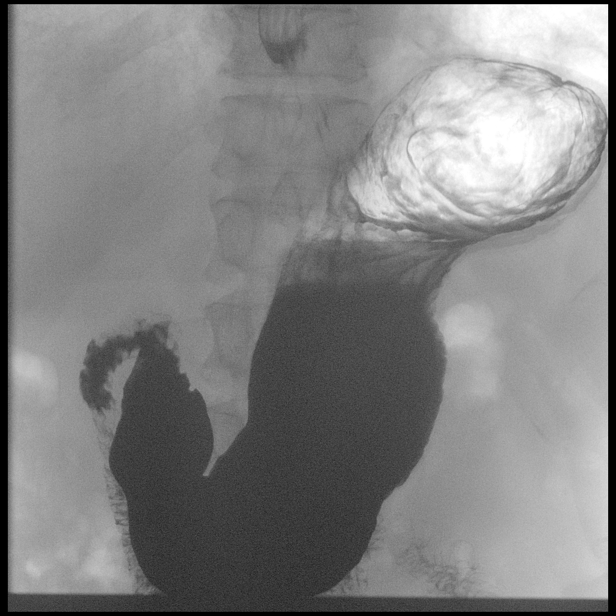

[14 of 24 positions shown; findings below may reference images not displayed]

FINDINGS: Cervical esophagus is widely patent. Small sliding hiatal hernia
with slight prominence of B ring. Mild tertiary esophageal
contractions consistent with presbyesophagus. No reflux.
Standardized barium tablet passes normally.
IMPRESSION: 1. Small sliding hiatal hernia with slightly prominent B ring. No
evidence of obstruction. Standardized barium tablet passes normally.

2. Mild tertiary esophageal contractions consistent presbyesophagus.
No reflux.

## 2020-10-18 ENCOUNTER — Encounter: Payer: Self-pay | Admitting: Cardiovascular Disease

## 2020-10-18 NOTE — Telephone Encounter (Signed)
Error

## 2020-10-19 ENCOUNTER — Ambulatory Visit (INDEPENDENT_AMBULATORY_CARE_PROVIDER_SITE_OTHER): Admitting: Cardiovascular Disease

## 2020-10-19 ENCOUNTER — Encounter: Payer: Self-pay | Admitting: Cardiovascular Disease

## 2020-10-19 ENCOUNTER — Other Ambulatory Visit: Payer: Self-pay

## 2020-10-19 VITALS — BP 110/62 | HR 73 | Ht 69.0 in | Wt 125.1 lb

## 2020-10-19 DIAGNOSIS — J9621 Acute and chronic respiratory failure with hypoxia: Secondary | ICD-10-CM | POA: Diagnosis not present

## 2020-10-19 DIAGNOSIS — R0789 Other chest pain: Secondary | ICD-10-CM | POA: Diagnosis not present

## 2020-10-19 DIAGNOSIS — I25118 Atherosclerotic heart disease of native coronary artery with other forms of angina pectoris: Secondary | ICD-10-CM

## 2020-10-19 DIAGNOSIS — J441 Chronic obstructive pulmonary disease with (acute) exacerbation: Secondary | ICD-10-CM | POA: Diagnosis not present

## 2020-10-19 DIAGNOSIS — J849 Interstitial pulmonary disease, unspecified: Secondary | ICD-10-CM | POA: Diagnosis not present

## 2020-10-19 NOTE — Progress Notes (Signed)
Patient ID: [name]., male   DOB: 09/11/1945, 75 y.o.   MRN: [id number] Cardiology Office Note  Date:  10/19/2020   ID:  Muneer Leider., DOB [birth year], MRN [id number]  PCP:  Elba Barman, MD   Chief Complaint  Patient presents with  . Shortness of Breath    Patient c/o shortness of breath and having a lot of stress and just feeling fatigue. Medications reviewed by the patient verbally.     HPI:  Russel Morain is a 75 year old gentleman with a history of  coronary artery disease, PCI with LAD PCI in 2008,  chronic chest pain which is musculoskeletal,  traumatic injury to the chest,   esophageal strictures, status post esophageal stretching,  h/o left neck pain radiating to the arm Felt to be musculoskeletal, and  Chronic shortness of breath With CT scan documenting pulmonary fibrosis  who follows up for his coronary artery disease  Last office visit May 2021 Seen in the emergency room February 2022 shortness of breath, pulmonary fibrosis  Other recent stressors, Ex-wife was in the hospital, intubated, now in hospice They have been together for 50 years Weight down 25 pounds in 1 year, No appetite BP running low, still on amlodipine  Followed by pulmonary at [facility] Dr. Lanney Gins CT scan documenting Pulmonary fibrosis (11/23/15)  Today wearing oxygen 76 2 to 3 liters Periodically while talking in the office has to stop, takes him 60 seconds to catch his breath Using flutter vest 3x a day  Chronic sputum Chronic fatigue Unable to walk, gets pushed around in a wheelchair secondary to respiratory distress on ambulation  EKG personally reviewed by myself on todays visit NSR rate 74 bpm , no ST or T wave changes No changes  Other past medical history Previous lung biopsy by Dr. Faith Rogue  Prior trauma to his mediastinum, Had severe rib pain, upper left mediastinal area,   previous stress test for chest pain 11/27/2013. There was no ischemia,   cardiac  catheterization in 2013 showing patent stent, no severe stenoses that would contribute to his chest pain. Right heart catheterization was done at the same time that showed normal right heart pressures    PMH:   has a past medical history of Anxiety, Asthma, Bradycardia, CHF (congestive heart failure) ([place]), Chronic Chest Pain, COPD (chronic obstructive pulmonary disease) ([place]), Coronary artery disease, Depression, Diabetes mellitus without complication ([place]), GERD (gastroesophageal reflux disease), Heart murmur, Hypercholesteremia, Hypertension, Hypertensive heart disease, Idiopathic pulmonary fibrosis ([place]), and Other social stressor.  PSH:    Past Surgical History:  Procedure Laterality Date  . CARDIAC CATHETERIZATION    . CORONARY ANGIOPLASTY     stents times 4  . ESOPHAGOGASTRODUODENOSCOPY (EGD) WITH PROPOFOL N/A 10/22/2019   Procedure: ESOPHAGOGASTRODUODENOSCOPY (EGD) WITH PROPOFOL;  Surgeon: Virgel Manifold, MD;  Location: [facility] ENDOSCOPY;  Service: Endoscopy;  Laterality: N/A;  . Esophagus stretch    . EYE SURGERY     Bilateral cataracts  . LUNG BIOPSY    . NASAL ENDOSCOPY N/A 12/21/2016   Procedure: NASAL ENDOSCOPY;  Surgeon: Margaretha Sheffield, MD;  Location: [place];  Service: ENT;  Laterality: N/A;  . TESTICLE SURGERY    . VIDEO ASSISTED THORACOSCOPY (VATS)/THOROCOTOMY Right 11/18/2014   Procedure: VIDEO ASSISTED THORACOSCOPY (VATS)/ wedge resection biopsy;  Surgeon: Nestor Lewandowsky, MD;  Location: [facility] ORS;  Service: General;  Laterality: Right;    Current Outpatient Medications  Medication Sig Dispense Refill  . Accu-Chek Softclix Lancets lancets USE AS DIRECTED EVERY  DAY    . albuterol (PROVENTIL) (2.5 MG/3ML) 0.083% nebulizer solution Take 3 mLs (2.5 mg total) by nebulization every 6 (six) hours as needed. 75 mL 11  . albuterol (VENTOLIN HFA) 108 (90 Base) MCG/ACT inhaler Inhale 2 puffs into the lungs every 4 (four) hours as needed for wheezing.    [name] ALPRAZolam  (XANAX) 0.5 MG tablet Take 0.5 mg by mouth every 4 (four) hours as needed.    [name] aspirin EC 81 MG tablet Take 81 mg by mouth every morning.    . benzonatate (TESSALON) 100 MG capsule Take 1 capsule (100 mg total) by mouth 3 (three) times daily as needed for cough. 20 capsule 0  . Blood Glucose Monitoring Suppl (ACCU-CHEK GUIDE ME) w/Device KIT DX E11.9 (MEDICAID PREFERRED)    . cefdinir (OMNICEF) 300 MG capsule Take 1 capsule (300 mg total) by mouth 2 (two) times daily. 20 capsule 0  . diclofenac Sodium (VOLTAREN) 1 % GEL Apply 1 application topically 4 (four) times daily.     [name] guaiFENesin-codeine 100-10 MG/5ML syrup Take 5 mLs by mouth every 4 (four) hours as needed.    [name] HYDROcodone-acetaminophen (NORCO/VICODIN) 5-325 MG tablet Take 1 tablet by mouth every 4 (four) hours as needed for moderate pain. 12 tablet 0  . ipratropium-albuterol (DUONEB) 0.5-2.5 (3) MG/3ML SOLN Take 3 mLs by nebulization 3 (three) times daily.    [name] lactulose (CHRONULAC) 10 GM/15ML solution Take 30 mLs (20 g total) by mouth daily as needed for mild constipation. 120 mL 0  . LORazepam (ATIVAN) 0.5 MG tablet Take 0.5 mg by mouth every 4 (four) hours as needed.    . meloxicam (MOBIC) 7.5 MG tablet Take 1 tablet (7.5 mg total) by mouth daily. 30 tablet 0  . metFORMIN (GLUCOPHAGE) 1000 MG tablet Take 1,000 mg by mouth 2 (two) times daily with a meal.    . Multiple Vitamins-Minerals (CENTRUM SILVER PO) Take 1 tablet by mouth daily.     . nitroGLYCERIN (NITROSTAT) 0.4 MG SL tablet Place 1 tablet (0.4 mg total) under the tongue every 5 (five) minutes as needed. 25 tablet 0  . oxyCODONE (ROXICODONE INTENSOL) 20 MG/ML concentrated solution SMARTSIG:0.5 Milliliter(s) By Mouth Every 4 Hours PRN    . pantoprazole (PROTONIX) 40 MG tablet Take 1 tablet (40 mg total) by mouth daily. 30 tablet 0  . predniSONE (DELTASONE) 20 MG tablet Take 1 tablet (20 mg total) by mouth daily with breakfast. 5 tablet 0  . predniSONE (STERAPRED UNI-PAK 21  TAB) 10 MG (21) TBPK tablet Per packaging instructions 21 tablet 0  . pregabalin (LYRICA) 50 MG capsule Take 100 mg by mouth 2 (two) times daily.     . rosuvastatin (CRESTOR) 10 MG tablet TAKE 1 TABLET (10 MG TOTAL) BY MOUTH DAILY. 90 tablet 2   No current facility-administered medications for this visit.     Allergies:   Ofev [nintedanib], Metoprolol succinate, Niacin, and Sertraline hcl   Social History:  The patient  reports that he quit smoking about 52 years ago. His smoking use included cigarettes. He has a 0.50 pack-year smoking history. He has never used smokeless tobacco. He reports that he does not drink alcohol and does not use drugs.   Family History:   family history includes Coronary artery disease in his mother; Depression in an other family member; Heart disease in an other family member; Heart failure in his mother.    Review of Systems: Review of Systems  Constitutional: Negative.   HENT:  Negative.   Respiratory: Positive for shortness of breath.   Cardiovascular: Negative.   Gastrointestinal: Negative.   Musculoskeletal: Positive for joint pain.  Neurological: Negative.   Psychiatric/Behavioral: Negative.   All other systems reviewed and are negative.   PHYSICAL EXAM: BP 110/62 (BP Location: Left Arm, Patient Position: Sitting, Cuff Size: Normal)   Pulse 73   Ht 5' 9"  (1.753 m)   Wt 125 lb 2 oz (56.8 kg)   SpO2 94% Comment: 3 liters of oxygen  BMI 18.48 kg/m  Constitutional:  oriented to person, place, and time. No distress.  HENT:  Head: Grossly normal Eyes:  no discharge. No scleral icterus.  Neck: No JVD, no carotid bruits  Cardiovascular: Regular rate and rhythm, no murmurs appreciated Pulmonary/Chest: Clear to auscultation bilaterally, no wheezes or rails Abdominal: Soft.  no distension.  no tenderness.  Musculoskeletal: Normal range of motion Neurological:  normal muscle tone. Coordination normal. No atrophy Skin: Skin warm and dry Psychiatric:  normal affect, pleasant  Recent Labs: 03/14/2020: B Natriuretic Peptide 71.2 03/15/2020: Magnesium 1.7; TSH 2.131 07/23/2020: ALT 11; BUN 13; Creatinine, Ser 0.89; Hemoglobin 12.4; Platelets 291; Potassium 3.8; Sodium 138    Lipid Panel Lab Results  Component Value Date   CHOL 94 (L) 10/02/2017   HDL 34 (L) 10/02/2017   LDLCALC 44 10/02/2017   TRIG 81 10/02/2017      Wt Readings from Last 3 Encounters:  10/19/20 125 lb 2 oz (56.8 kg)  07/23/20 146 lb (66.2 kg)  03/14/20 154 lb 1.6 oz (69.9 kg)      ASSESSMENT AND PLAN:  Chest pain, unspecified chest pain type -  Stable, long history atypical chest pain Hx of Atypical pain, "hurts to breath"  Syncope, unspecified syncope type -  Stable, no recent episodes Recommend he try to maintain his oxygenation  Hypertensive heart disease without heart failure Blood pressure is well controlled on today's visit. No changes made to the medications.  Shortness of breath Long history of pulmonary fibrosis followed by pulmonary at [facility] On oxygen 2-3 liters Chronic cough , no change Outlook is poor, needs to consider palliative given massive weight loss, high risk of sudden death, terminal lung disease  ILD (interstitial lung disease) ([place]) Seen by pulmonary on chronic oxygen  Cad with stable angina Currently with no symptoms of angina. No further workup at this time. Continue current medication regimen.   Total encounter time more than 25 minutes  Greater than 50% was spent in counseling and coordination of care with the patient   Orders Placed This Encounter  Procedures  . EKG 12-Lead     Signed, Esmond Plants, M.D., Ph.D. 10/19/2020  [place], [place]

## 2020-10-19 NOTE — Patient Instructions (Addendum)
Medication Instructions:  Stop amlodipine Stay on crestor 10 mg daily  If you need a refill on your cardiac medications before your next appointment, please call your pharmacy.    Lab work: No new labs needed   If you have labs (blood work) drawn today and your tests are completely normal, you will receive your results only by: [name] MyChart Message (if you have MyChart) OR . A paper copy in the mail If you have any lab test that is abnormal or we need to change your treatment, we will call you to review the results.   Testing/Procedures: No new testing needed   Follow-Up: At [facility], you and your health needs are our priority.  As part of our continuing mission to provide you with exceptional heart care, we have created designated Provider Care Teams.  These Care Teams include your primary Cardiologist (physician) and Advanced Practice Providers (APPs -  Physician Assistants and Nurse Practitioners) who all work together to provide you with the care you need, when you need it.  . You will need a follow up appointment in 12 months  . Providers on your designated Care Team:   . Nicolasa Ducking, NP . Eula Listen, PA-C . Marisue Ivan, PA-C  Any Other Special Instructions Will Be Listed Below (If Applicable).  COVID-19 Vaccine Information can be found at: [email or web address] For questions related to vaccine distribution or appointments, please email [email or web address] or call [phone].

## 2020-10-20 ENCOUNTER — Ambulatory Visit: Payer: Medicare Other | Admitting: Cardiovascular Disease

## 2020-10-26 ENCOUNTER — Ambulatory Visit: Payer: Medicare Other | Admitting: Physician Assistant

## 2021-02-14 ENCOUNTER — Other Ambulatory Visit: Payer: Self-pay | Admitting: Cardiovascular Disease

## 2021-02-16 ENCOUNTER — Encounter: Payer: Self-pay | Admitting: Emergency Medicine

## 2021-02-16 ENCOUNTER — Other Ambulatory Visit: Payer: Self-pay

## 2021-02-16 DIAGNOSIS — Z7951 Long term (current) use of inhaled steroids: Secondary | ICD-10-CM | POA: Diagnosis not present

## 2021-02-16 DIAGNOSIS — I110 Hypertensive heart disease with heart failure: Secondary | ICD-10-CM | POA: Diagnosis not present

## 2021-02-16 DIAGNOSIS — E119 Type 2 diabetes mellitus without complications: Secondary | ICD-10-CM | POA: Insufficient documentation

## 2021-02-16 DIAGNOSIS — Z87891 Personal history of nicotine dependence: Secondary | ICD-10-CM | POA: Diagnosis not present

## 2021-02-16 DIAGNOSIS — J449 Chronic obstructive pulmonary disease, unspecified: Secondary | ICD-10-CM | POA: Insufficient documentation

## 2021-02-16 DIAGNOSIS — Z7982 Long term (current) use of aspirin: Secondary | ICD-10-CM | POA: Insufficient documentation

## 2021-02-16 DIAGNOSIS — I509 Heart failure, unspecified: Secondary | ICD-10-CM | POA: Diagnosis not present

## 2021-02-16 DIAGNOSIS — Z955 Presence of coronary angioplasty implant and graft: Secondary | ICD-10-CM | POA: Insufficient documentation

## 2021-02-16 DIAGNOSIS — J45909 Unspecified asthma, uncomplicated: Secondary | ICD-10-CM | POA: Insufficient documentation

## 2021-02-16 DIAGNOSIS — I2510 Atherosclerotic heart disease of native coronary artery without angina pectoris: Secondary | ICD-10-CM | POA: Diagnosis not present

## 2021-02-16 DIAGNOSIS — Z7984 Long term (current) use of oral hypoglycemic drugs: Secondary | ICD-10-CM | POA: Diagnosis not present

## 2021-02-16 DIAGNOSIS — R0789 Other chest pain: Secondary | ICD-10-CM | POA: Diagnosis not present

## 2021-02-16 NOTE — ED Triage Notes (Signed)
Pt to ED from home c/o right side pain that started last night around 2330 that woke him up out of sleep, tender to touch and with movement, denies urinary changes.  States called hospice nurse for advice and told to come to ED.  Hx of pulmonary fibrosis.  Chronic 3L [place].  Pt A&Ox4, chest rise even and unlabored, skin WNL.

## 2021-02-17 ENCOUNTER — Emergency Department
Admission: EM | Admit: 2021-02-17 | Discharge: 2021-02-17 | Disposition: A | Discharge status: Home / Self Care | Attending: Emergency Medicine | Admitting: Emergency Medicine

## 2021-02-17 ENCOUNTER — Emergency Department

## 2021-02-17 DIAGNOSIS — R0789 Other chest pain: Secondary | ICD-10-CM

## 2021-02-17 LAB — URINALYSIS, COMPLETE (UACMP) WITH MICROSCOPIC
Bacteria, UA: NONE SEEN
Bilirubin Urine: NEGATIVE
Glucose, UA: >=500 mg/dL — AB
Ketones, ur: NEGATIVE mg/dL
Leukocytes,Ua: NEGATIVE
Nitrite: NEGATIVE
Protein, ur: NEGATIVE mg/dL
Specific Gravity, Urine: >1.046 — ABNORMAL HIGH (ref 1.005–1.030)
Squamous Epithelial / HPF: NONE SEEN (ref 0–5)
pH: 6 (ref 5.0–8.0)

## 2021-02-17 LAB — CBC
HCT: 46.1 % (ref 39.0–52.0)
Hemoglobin: 15.2 g/dL (ref 13.0–17.0)
MCH: 28.8 pg (ref 26.0–34.0)
MCHC: 33 g/dL (ref 30.0–36.0)
MCV: 87.3 fL (ref 80.0–100.0)
Platelets: 215 10*3/uL (ref 150–400)
RBC: 5.28 MIL/uL (ref 4.22–5.81)
RDW: 13.6 % (ref 11.5–15.5)
WBC: 12.1 10*3/uL — ABNORMAL HIGH (ref 4.0–10.5)
nRBC: 0 % (ref 0.0–0.2)

## 2021-02-17 LAB — BASIC METABOLIC PANEL
Anion gap: 9 (ref 5–15)
BUN: 15 mg/dL (ref 8–23)
CO2: 31 mmol/L (ref 22–32)
Calcium: 9.2 mg/dL (ref 8.9–10.3)
Chloride: 99 mmol/L (ref 98–111)
Creatinine, Ser: 0.87 mg/dL (ref 0.61–1.24)
GFR, Estimated: >60 mL/min (ref >60)
Glucose, Bld: 151 mg/dL — ABNORMAL HIGH (ref 70–99)
Potassium: 3.7 mmol/L (ref 3.5–5.1)
Sodium: 139 mmol/L (ref 135–145)

## 2021-02-17 MED ORDER — IOHEXOL 350 MG/ML SOLN
75.0000 mL | Freq: Once | INTRAVENOUS | Status: AC | PRN
  Administered 2021-02-17: 75 mL via INTRAVENOUS

## 2021-02-17 MED ORDER — LIDOCAINE 5 % EX PTCH
1.0000 | MEDICATED_PATCH | CUTANEOUS | Status: DC
  Administered 2021-02-17: 1 via TRANSDERMAL
  Filled 2021-02-17: qty 1

## 2021-02-17 NOTE — ED Provider Notes (Signed)
[facility] Emergency Department Provider Note   ____________________________________________   I have reviewed the triage vital signs and the nursing notes.   HISTORY  Chief Complaint Right sided pain   History limited by: Not Limited   HPI Shane Reid. is a 75 y.o. male who presents to the emergency department today because of concern for right sided pain. The patient states that he first had an episode of this pain last night. He describes it as severe. Located on the right side of his lower chest. It is worse with movement, touch and deep breaths. The patient states that the pain did go away after taking an oxycodone. However tonight the pain started again. The patient denies any trauma to his side. Denies any recent fevers or chills. Denies any recent illness.    Records reviewed. Per medical record review patient has a history of COPD, CHF, DM  Past Medical History:  Diagnosis Date   Anxiety    Asthma    Bradycardia    CHF (congestive heart failure) (HCC)    Chronic Chest Pain    COPD (chronic obstructive pulmonary disease) ([place])    Coronary artery disease    a. 2008 s/p PCI to LAD;  b. 2013 Cath: patent stent->Med Rx;  c. 11/2013 MV: EF 67%, no ischemia/infarct.   Depression    Diabetes mellitus without complication (HCC)    GERD (gastroesophageal reflux disease)    Heart murmur    a. 02/2010 Echo: EF 55-60%, no rwma, Gr 2 DD, triv MR, mildly dil LA, nl RV.   Hypercholesteremia    Hypertension    Hypertensive heart disease    Idiopathic pulmonary fibrosis ([place])    a. Seen by Dr. Stevenson Clinch 01/2015 and Rx Esbriet - pt cannot afford.   Other social stressor    a. 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   Hypertension 04/09/2018   Pain in limb 04/09/2018   Chronic rhinitis 06/27/2017   History of nasal surgery 06/27/2017   Anejaculation 03/01/2017   Benign localized hyperplasia of prostate with urinary obstruction 03/01/2017   Incomplete emptying of bladder 03/01/2017   Retention of urine, unspecified 03/01/2017   Scrotal pain 09/11/2016   Oropharyngeal dysphagia 09/21/2015   Hypertensive heart disease    Chronic Chest Pain    Ischemic heart disease    Hypercholesteremia    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   IPF (idiopathic pulmonary fibrosis) ([place]) 12/23/2014   Pre-operative cardiovascular examination 11/05/2014   ILD (interstitial lung disease) ([place]) 09/04/2014   Interstitial lung disease ([place]) 06/22/2014   Upper respiratory infection 12/03/2013   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   Cough 07/28/2010   CAROTID BRUIT, LEFT 05/12/2010   Hyperlipidemia 07/01/2009   CAD, NATIVE VESSEL 07/01/2009   SHORTNESS OF BREATH 07/01/2009   Atypical chest pain 07/01/2009   Disorder of male genital organs 07/13/2008   Bradycardia 11/04/2007   Family history of early CAD 01/23/2007   Anxiety and depression 02/02/2005    Past Surgical History:  Procedure Laterality Date   CARDIAC  CATHETERIZATION     CORONARY ANGIOPLASTY     stents times 4   ESOPHAGOGASTRODUODENOSCOPY (EGD) WITH PROPOFOL N/A 10/22/2019   Procedure: ESOPHAGOGASTRODUODENOSCOPY (EGD) WITH PROPOFOL;  Surgeon: Virgel Manifold, MD;  Location: [facility] ENDOSCOPY;  Service: Endoscopy;  Laterality: N/A;   Esophagus stretch     EYE SURGERY     Bilateral cataracts   LUNG BIOPSY     NASAL ENDOSCOPY N/A 12/21/2016   Procedure: NASAL ENDOSCOPY;  Surgeon: Margaretha Sheffield, MD;  Location: [place];  Service:  ENT;  Laterality: N/A;   TESTICLE SURGERY     VIDEO ASSISTED THORACOSCOPY (VATS)/THOROCOTOMY Right 11/18/2014   Procedure: VIDEO ASSISTED THORACOSCOPY (VATS)/ wedge resection biopsy;  Surgeon: Nestor Lewandowsky, MD;  Location: [facility] ORS;  Service: General;  Laterality: Right;    Prior to Admission medications   Medication Sig Start Date End Date Taking? Authorizing Provider  Accu-Chek Softclix Lancets lancets USE AS DIRECTED EVERY DAY 04/12/19   [provider]  albuterol (PROVENTIL) (2.5 MG/3ML) 0.083% nebulizer solution Take 3 mLs (2.5 mg total) by nebulization every 6 (six) hours as needed. 05/02/12   Minna Merritts, MD  albuterol (VENTOLIN HFA) 108 (90 Base) MCG/ACT inhaler Inhale 2 puffs into the lungs every 4 (four) hours as needed for wheezing. 01/22/06   [provider]  ALPRAZolam Duanne Moron) 0.5 MG tablet Take 0.5 mg by mouth every 4 (four) hours as needed. 09/23/20   [provider]  aspirin EC 81 MG tablet Take 81 mg by mouth every morning. 08/06/12   [provider]  benzonatate (TESSALON) 100 MG capsule Take 1 capsule (100 mg total) by mouth 3 (three) times daily as needed for cough. 07/23/20   Nance Pear, MD  Blood Glucose Monitoring Suppl (ACCU-CHEK GUIDE ME) w/Device KIT DX E11.9 (MEDICAID PREFERRED) 04/11/19   [provider]  cefdinir (OMNICEF) 300 MG capsule Take 1 capsule (300 mg total) by mouth 2 (two) times daily. 03/15/20   Patrecia Pour, MD  diclofenac Sodium (VOLTAREN) 1 % GEL Apply 1 application topically 4 (four) times daily.  11/07/19   [provider]  guaiFENesin-codeine 100-10 MG/5ML syrup Take 5 mLs by mouth every 4 (four) hours as needed. 09/28/20   [provider]  HYDROcodone-acetaminophen (NORCO/VICODIN) 5-325 MG tablet Take 1 tablet by mouth every 4 (four) hours as needed for moderate pain. 02/24/20   [name], Charline Bills, PA-C  ipratropium-albuterol (DUONEB) 0.5-2.5 (3) MG/3ML SOLN Take 3 mLs by  nebulization 3 (three) times daily. 10/10/20   [provider]  lactulose (CHRONULAC) 10 GM/15ML solution Take 30 mLs (20 g total) by mouth daily as needed for mild constipation. 12/28/19   Paulette Blanch, MD  LORazepam (ATIVAN) 0.5 MG tablet Take 0.5 mg by mouth every 4 (four) hours as needed. 09/17/20   [provider]  meloxicam (MOBIC) 7.5 MG tablet Take 1 tablet (7.5 mg total) by mouth daily. 02/24/20 02/23/21  [name], Charline Bills, PA-C  metFORMIN (GLUCOPHAGE) 1000 MG tablet Take 1,000 mg by mouth 2 (two) times daily with a meal.    [provider]  Multiple Vitamins-Minerals (CENTRUM SILVER PO) Take 1 tablet by mouth daily.     [provider]  nitroGLYCERIN (NITROSTAT) 0.4 MG SL tablet Place 1 tablet (0.4 mg total) under the tongue every 5 (five) minutes as needed. 07/30/17   Minna Merritts, MD  oxyCODONE (ROXICODONE INTENSOL) 20 MG/ML concentrated solution SMARTSIG:0.5 Milliliter(s) By Mouth Every 4 Hours PRN 10/06/20   [provider]  pantoprazole (PROTONIX) 40 MG tablet Take 1 tablet (40 mg total) by mouth daily. 11/10/19   Virgel Manifold, MD  predniSONE (DELTASONE) 20 MG tablet Take 1 tablet (20 mg total) by mouth daily with breakfast. 03/15/20   Patrecia Pour, MD  predniSONE (STERAPRED UNI-PAK 21 TAB) 10 MG (21) TBPK tablet Per packaging instructions 07/23/20   Nance Pear, MD  pregabalin (LYRICA) 50 MG capsule Take 100 mg by mouth 2 (two) times daily.  08/13/19   [provider]  rosuvastatin (CRESTOR) 10 MG tablet TAKE 1 TABLET BY MOUTH EVERY DAY 02/14/21   Minna Merritts, MD    Allergies Ofev [nintedanib], Metoprolol succinate, Niacin, and Sertraline hcl  Family History  Problem Relation Age of Onset   Coronary artery disease Mother        s/p CABG   Heart failure Mother    Heart disease Other    Depression Other     Social History Social History   Tobacco Use   Smoking status: Former    Packs/day: 0.50     Years: 1.00    Pack years: 0.50    Types: Cigarettes    Quit date: 05/09/1968    Years since quitting: 52.8   Smokeless tobacco: Never  Vaping Use   Vaping Use: Never used  Substance Use Topics   Alcohol use: No   Drug use: No    Review of Systems Constitutional: No fever/chills Eyes: No visual changes. ENT: No sore throat. Cardiovascular: Positive for right lower chest pain. Respiratory: Denies shortness of breath. Gastrointestinal: No abdominal pain.  No nausea, no vomiting.  No diarrhea.   Genitourinary: Negative for dysuria. Musculoskeletal: Negative for back pain. Skin: Negative for rash. Neurological: Negative for headaches, focal weakness or numbness.  ____________________________________________   PHYSICAL EXAM:  VITAL SIGNS: ED Triage Vitals  Enc Vitals Group     BP 02/16/21 2353 128/84     Pulse Rate 02/16/21 2353 87     Resp 02/16/21 2353 (!) 24     Temp 02/16/21 2353 98.2 F (36.8 C)     Temp Source 02/16/21 2353 Oral     SpO2 02/16/21 2353 95 %     Weight 02/16/21 2355 140 lb (63.5 kg)     Height 02/16/21 2355 5' 9"  (1.753 m)     Head Circumference --      Peak Flow --      Pain Score 02/16/21 2354 10   Constitutional: Alert and oriented.  Eyes: Conjunctivae are normal.  ENT      Head: Normocephalic and atraumatic.      Nose: No congestion/rhinnorhea.      Mouth/Throat: Mucous membranes are moist.      Neck: No stridor. Hematological/Lymphatic/Immunilogical: No cervical lymphadenopathy. Cardiovascular: Normal rate, regular rhythm.  No murmurs, rubs, or gallops.  Respiratory: Normal respiratory effort without tachypnea nor retractions. Breath sounds are clear and equal bilaterally. No wheezes/rales/rhonchi. Gastrointestinal: Soft and non tender. No rebound. No guarding.  Genitourinary: Deferred Musculoskeletal: Normal range of motion in all extremities. No lower extremity edema. Neurologic:  Normal speech and language. No gross focal neurologic  deficits are appreciated.  Skin:  Skin is warm, dry and intact. No rash noted. Psychiatric: Mood and affect are normal. Speech and behavior are normal. Patient exhibits appropriate insight and judgment.  ____________________________________________    LABS (pertinent positives/negatives)  BMP wnl except glu 151 CBC wbc 12.1, hgb 15.2, plt  215 UA not consistent with infection ____________________________________________  EKG  None  ____________________________________________    RADIOLOGY  CT angio chest No PE. No acute abnormality.   ____________________________________________   PROCEDURES  Procedures  ____________________________________________   INITIAL IMPRESSION / ASSESSMENT AND PLAN / ED COURSE  Pertinent labs & imaging results that were available during my care of the patient were reviewed by me and considered in my medical decision making (see chart for details).   Patient presents to the ER for lower right chest pain. On exam patient in no respiratory distress. Slight leukocytosis on blood, however CT angio without pneumonia. Also no evidence of PE. Did show gallstones however no evidence of cholecystitis and patient is not tender over the RUQ. At this point unclear etiology of the pain. Did offer lidoderm to see if that would help but patient declined.   ___________________________________________   FINAL CLINICAL IMPRESSION(S) / ED DIAGNOSES  Final diagnoses:  Right-sided chest wall pain     Note: This dictation was prepared with Dragon dictation. Any transcriptional errors that result from this process are unintentional     Nance Pear, MD 02/17/21 [phone]

## 2021-02-17 NOTE — ED Notes (Signed)
Patient transported to CT via wheelchair

## 2021-02-17 NOTE — ED Notes (Signed)
E signature pad not available. Pt signed paper consent to discharge.

## 2021-02-17 NOTE — Discharge Instructions (Addendum)
Please seek medical attention for any high fevers, chest pain, shortness of breath, change in behavior, persistent vomiting, bloody stool or any other new or concerning symptoms.  

## 2021-04-28 ENCOUNTER — Telehealth: Payer: Self-pay | Admitting: Nurse Practitioner

## 2021-04-28 NOTE — Telephone Encounter (Signed)
Spoke with patient and have scheduled an In-home Palliative Consult for 05/05/21 @ 3:45 PM

## 2021-05-05 ENCOUNTER — Other Ambulatory Visit: Payer: Self-pay

## 2021-05-05 ENCOUNTER — Other Ambulatory Visit: Payer: Medicare Other | Admitting: Nurse Practitioner

## 2021-05-05 DIAGNOSIS — Z515 Encounter for palliative care: Secondary | ICD-10-CM

## 2021-05-05 DIAGNOSIS — F4321 Adjustment disorder with depressed mood: Secondary | ICD-10-CM

## 2021-05-05 DIAGNOSIS — R0602 Shortness of breath: Secondary | ICD-10-CM

## 2021-05-05 DIAGNOSIS — J84112 Idiopathic pulmonary fibrosis: Secondary | ICD-10-CM

## 2021-05-05 NOTE — Progress Notes (Signed)
[place] [facility] Consult Note Telephone: [phone]  Fax: [phone]    Date of encounter: 05/05/21 6:40 PM PATIENT NAME: Shane Reid. [address] [place] [address]   [phone] (home)  DOB: Dec 30, 1945 MRN: [id number] PRIMARY CARE PROVIDER:    Elba Barman, MD,  [place] [address] [phone]  REFERRING PROVIDER:   Elba Barman, MD [place],  [place] [address] [phone]  RESPONSIBLE PARTY:    Contact Information     Name Relation Home Work Mobile   [place]   [phone]      I met face to face with patient in home. Palliative Care was asked to follow this patient by consultation request of  Wroth, Honor Loh, MD to address advance care planning and complex medical decision making. This is a follow up visit.                                  ASSESSMENT AND PLAN / RECOMMENDATIONS:  Symptom Management/Plan: 1. Advance Care Planning; will revisit with [name] since d/c from Hospice he is thinking about it  2. Shortness of breath secondary to Idiopathic pulmonary fibrosis; continue O2, increasing when severe dyspnea, continue oxycodone as prescribed; discussed at length about inhalation therapy, continue with daily prednisone, percussion vest.   3. Anxiety secondary to secondary to Idiopathic pulmonary fibrosis and grieving with recent loss of wife. Discussed will continue xanax as prescribed. Will re-involve PC SW to explore grief counseling. [name] carries Shane Welchel ashes from room to room with him throughout the day, cries every day. Very sad. We talked about coping strategies. We talked about grieving at length. We talked about managing anxiety to improving breathing. Emotional support provided.   4. Goals of Care: Goals include to maximize quality of life and symptom management. Our advance care planning conversation included a  discussion about:    The value and importance of advance care planning  Exploration of personal, cultural or spiritual beliefs that might influence medical decisions  Exploration of goals of care in the event of a sudden injury or illness  Identification and preparation of a healthcare agent  Review and updating or creation of an  advance directive document.   5. Palliative care encounter; Palliative care encounter; Palliative medicine team will continue to support patient, patient's family, and medical team. Visit consisted of counseling and education dealing with the complex and emotionally intense issues of symptom management and palliative care in the setting of serious and potentially life-threatening illness  Follow up Palliative Care Visit: Palliative care will continue to follow for complex medical decision making, advance care planning, and clarification of goals. Return 1 week for [facility] RN; 4 weeks for NP PC or prn.  I spent 62 minutes providing this consultation. More than 50% of the time in this consultation was spent in counseling and care coordination. PPS: 50%  Chief Complaint: Follow up palliative consult for complex medical decision making  HISTORY OF PRESENT ILLNESS:  Shane Reid. is a 75 y.o. year old male  with multiple medical problems including Idiopathic pulmonary fibrosis, coronary artery disease s/p stent, COPD, congestive heart failure, dysphasia, hypertension, heart murmur, diabetes, COPD, asthma, chronic chest pain, gerd, anxiety. I called Shane Reid to confirm in person PC visit and covid screening negative. We talked about how he has been  feeling. Shane Reid was very anxious, short of breath with speaking, exertion. Shane Reid and I talked about worsening of shortness of breath since d/c from hospice. Mr. Si endorses he continues to have to rest between steps, he does become dizzy at times. We talked about safety. Shane Reid talked about recent move to an  apartment with all his belongings in a storage place. Shane Reid endorses he has been struggling with all aspects of his life. He does bath, dress. He does not go outside his home as he is concerned about the safety in his neighborhood. Shane Reid endorses he is grieving every day, crying, carrying Shane Reid ashes from room to room. We talked about managing shortness of breath, anxiety. We talked about family dynamics. We talked about appetite which is declined. He does get meals on wheels. We talked about weight loss since hospice. Mr. Pakula endorses he does not have a scale. Measured. We talked about family dynamics. We talked about will re-visit Hospice option with Hospice Physician with what appears to be a further decline. We talked about role pc in poc. Therapeutic listening, emotional support proved. Questions answered.   History obtained from review of EMR, discussion with  Mr. Enriques.  I reviewed available labs, medications, imaging, studies and related documents from the EMR.  Records reviewed and summarized above.   ROS Full 10 system review of systems performed and negative with exception of: as per HPI.   Physical Exam: Constitutional: NAD General: frail appearing, thin, chronically ill male EYES: lids intact ENMT: oral mucous membranes moist CV: S1S2, RRR, no LE edema Pulmonary: crackles, decreased throughout, + increased work of breathing, +cough, O2 Abdomen: normo-active BS + 4 quadrants, soft and non tender MSK: ambulatory; muscle wasting Skin: warm and dry Neuro:  + generalized weakness,  no cognitive impairment Psych: +anxious affect, A and O x 3 Thank you for the opportunity to participate in the care of Shane Reid.  The palliative care team will continue to follow. Please call our office at [phone] if we can be of additional assistance.   Questions and concerns were addressed. The patient/family was encouraged to call with questions and/or concerns. My business card  was provided. Provided general support and encouragement, no other unmet needs identified  This chart was dictated using voice recognition software.  Despite best efforts to proofread,  errors can occur which can change the documentation meaning.    Z , NP   COVID-19 PATIENT SCREENING TOOL Asked and negative response unless otherwise noted:   Have you had symptoms of covid, tested positive or been in contact with someone with symptoms/positive test in the past 5-10 days? NO  

## 2021-05-09 ENCOUNTER — Telehealth: Payer: Self-pay

## 2021-05-09 NOTE — Telephone Encounter (Signed)
910 am.  Request from Christin Gusler, NP to make a home visit to follow up on patient's overall condition.  Phone call made  to patient and visit is scheduled for tomorrow at 9 am.

## 2021-05-10 ENCOUNTER — Other Ambulatory Visit: Payer: Self-pay

## 2021-05-10 ENCOUNTER — Other Ambulatory Visit: Payer: Medicare Other

## 2021-05-10 VITALS — BP 130/74 | HR 68 | Temp 97.3°F | Resp 32

## 2021-05-10 DIAGNOSIS — Z515 Encounter for palliative care: Secondary | ICD-10-CM

## 2021-05-10 NOTE — Progress Notes (Signed)
COMMUNITY PALLIATIVE CARE SW NOTE  PATIENT NAME: Shane Reid. DOB: 1945/07/16 MRN: [id number]  PRIMARY CARE PROVIDER: Elba Barman, MD  RESPONSIBLE PARTY:  Acct ID - Guarantor Home Phone Work Phone Relationship Acct Type  [id number] Shane, [name]  Self P/F     [place], [place] [address]     PLAN OF CARE and INTERVENTIONS:             GOALS OF CARE/ ADVANCE CARE PLANNING:  Goals include to maximize quality of life and symptom management. Our advance care planning conversation included a discussion about:    The value and importance of advance care planning  Review and updating or creation of an advance directive document.  Patient is currently a DNR. Patient's son, Shane Reid, holds POA over him. Patient does not have a Will.    2.         SOCIAL/EMOTIONAL/SPIRITUAL ASSESSMENT/ INTERVENTIONS:  SW and RN met with patient in patients home for monthly palliative care visit. Patient resides in a one bedroom apartment independently.   Functional changes/updates: Patient is a 75 y.o. year old male with dx of ILD, IPF, Chronic respiratory failure and COPD. Patient was recently discharged from hospice services due to being medically stable.  Patient is ambulatory w/o AD, but present with concerns around endurance which can affect his gait. No falls reported. Patient denies any pain, but does state concern around his weight loss. Patient does not drive currently due to not having a working vehicle.   Home & Environment assessment: There has one step to enter his apartment from the back door, which is the preferred entrance. Patient states that he does not feel safe in his environment and neighborhood, due to being unfamiliar with the surroundings, however patient did share that he walked up the street to the convenient store not long ago. Living quarter is open and useful enough for patient to maneuver. PC witnessed patient ambulating throughout house during visit  without issue. Patient does not have DME needs other than O2. Patient shared that he would like to move back to the area where he lived prior and has been told by his son that they will be looking into this option around the end of next month.  Protein calorie malnutrition/weight loss/Appetite: Patient share that his appetite is fair. Patient has MOW's and states that his son and DIL sometimes goes grocery shopping for him. Patient has food stamps and [employer] food assistance care, both of which his son has per patient. SW advised patient that he could order his groceries online and have them delivered, as he voiced some frustration around having to wait on his son and DIL to bring his groceries sometimes. However patient declined this option and stated that he is not too worried about it, he knows they will bring it when they can. No medication needs. Patient share that he uses [facility] and his DIL will go pick up his medications. SW discussed the option of having his medications delivered patient declined this option as well.   Psychosocial assessment: completed. No physical needs identified at this time. Patient lacks transportation, SW discussed [employer], patient is aware of this service and as the contact information for them. Patient receives MOW's and son goes grocery shopping other needs. Patient voiced concern around finances as he recently had to move out of his previous home into his current apartment and is unsure of what bills he has to pay due to  his mail still going to his old address. Patient has completed a change in address. Patients son is to go by his old home to check mail. Patient is a recent widow and continues to grieve the passing of his wife daily.  Patient shared that the Hospice chaplain checked in on hin a few times after the passing of his wife but that has been all the support he has received. Patient declined grief counseling support at this time. Patient share that he is now open to  getting out of the house more and doing things like going to restaurants to eat or visiting [employer], however he does not have a car and is not too interested in utilizing [facility] for transportation support. Ongoing support/resources will continued to be offered as needed.  Military hx: N/A   Medical assessment: RN reviewed medications and took vitals. RN team provided education around potential life threatening illness and symptom management. Patient uses pill box to assist with medication management. No other medication concerns.   Hospice discussion: N/A  SW discussed goals, reviewed care plan, provided emotional support, used active and reflective listening in the form of reciprocity emotional response. Questions and concerns were addressed. The patient was encouraged to call with any additional questions and/or concerns. PC Provided general support and encouragement, no other unmet needs identified. Will continue to follow.  3.         PATIENT/CAREGIVER EDUCATION/ COPING:   Appearance: well groomed, appropriate given situation. States he would like a haircut and son is to assist with this. Mental Status: Alert/oriented x3. Eye Contact: Good  Thought Process: rational  Thought Content: No signs of SI/SA/HA  Speech: Normal rate, volume, tone  Mood: Normal and calm, considering circumstances Affect: Congruent to endorsed mood, full ranging Insight: normal Judgement: normal  Interaction Style: Cooperative  Patient A&O x3. Patient able to engage in casual fluid conversation. Patient seems to be in minimal distress today, but does become tearful and saddened when talking about his recent wife. Patient has anxiety and depression. Patient exhibited anxiety symptoms during visit, witnessed when MOW's were being delivered and patient rushed to the door, after this encounter patient appeared to become light headed and unsteady on his feet and was instructed to take deep breaths. Patients takes oxycodone  and zantac to assist with breathing and anxiety. PHQ 9 of 10 indicating moderate depression. Patient feels lack of support from family, especially since the passing of his wife.  4.         PERSONAL EMERGENCY PLAN:  Patient will call 9-1-1 for emergencies.   5.         COMMUNITY RESOURCES COORDINATION/ HEALTH CARE NAVIGATION:  patient manages his care.  6.  FINANCIAL CONCERNS/NEEDS: None.   Primary Health Insurance: [facility] Medicare Advantage Secondary Health Insurance: [employer] Prescription Coverage: Yes, no history of difficulty obtaining or affording prescriptions reported     SOCIAL HX:  Social History   Tobacco Use   Smoking status: Former    Packs/day: 0.50    Years: 1.00    Pack years: 0.50    Types: Cigarettes    Quit date: 05/09/1968    Years since quitting: 53.0   Smokeless tobacco: Never  Substance Use Topics   Alcohol use: No    CODE STATUS: DNR  ADVANCED DIRECTIVES: Y. Son Shane Reid is POA MOST FORM COMPLETE:  N HOSPICE EDUCATION PROVIDED: N  PPS: Patient is independent with all ADL's at this time Patient is able to cook small things.  Patient is A&O x3 with average insight and judgement.    Time spent: 1 hr    [place], [place]

## 2021-05-10 NOTE — Progress Notes (Signed)
PATIENT NAME: Shane Reid. DOB: 1945-11-19 MRN: [id number]  PRIMARY CARE PROVIDER: Mickel Fuchs, MD  RESPONSIBLE PARTY:  Acct ID - Guarantor Home Phone Work Phone Relationship Acct Type  [id number] Shane Reid, Shane Reid* [phone]  Self P/F     [address], [place], [place] [address]    PLAN OF CARE and INTERVENTIONS:               1.  GOALS OF CARE/ ADVANCE CARE PLANNING:  Patient would like to move from his current location.  He does not feel safe as he is not familiar with [place].  Patient would like to remain independent for as long as possible.               2.  PATIENT/CAREGIVER EDUCATION:  SW provided education on counseling.                 4. PERSONAL EMERGENCY PLAN:  Activate 911 for emergencies.               5.  DISEASE STATUS:  Anorexia:  Patient feels he has lost weight over the last 1-2 months. Patient is frail and thin in appearance.  Muscle wasting present and temporal wasting noted.  Currently has meals on wheels.   Has children but their involvement has been very limited.  Left mid-arm circumference is 9 inches today.  Unable to weigh patient as his scale did not come with him from his previous home.  Food box left for patient including Boost supplement drinks.  Patient continues to drink supplement drinks 2-3 x a day.   Dyspnea:  Patient continues with shortness of breath with minimal exertion.  Currently on 2.5 L via [place].   Denies need to increase oxygen but states he has dropped the liter flow to 1 L on occasion and O2 sats remained in the 90's.  Not currently using his  percussion vest as it is in storage.  Patient is using a nebulizer 3 x daily that has been effective.   Occasional cough but no sputum production.  Diabetes:  Checking blood sugars routinely.  CBG this am 257.   Grief:  Patient actively grieving the recent loss of his wife.  Allowed patient to share events over the last year and passing of his wife. Patient tearful throughout conversation.  SW  discussed counseling with patient.   Anxiety:  Patient is taking xanax to aide in anxiety and notes this has been effective.    HISTORY OF PRESENT ILLNESS:  Shane Reid. is a 75 y.o. year old male  with multiple medical problems including Idiopathic pulmonary fibrosis, coronary artery disease s/p stent, COPD, congestive heart failure, dysphasia, hypertension, heart murmur, diabetes, COPD, asthma, chronic chest pain, gerd,  and anxiety.   Patient is being followed by Palliative Care every 4 weeks and PRN.   CODE STATUS: DNR -uploaded in Vynca by SW.  ADVANCED DIRECTIVES: No MOST FORM: No PPS: 50%   PHYSICAL EXAM:   VITALS: Today's Vitals   05/10/21 0914  BP: 130/74  Pulse: 68  Resp: (!) 32  Temp: (!) 97.3 F (36.3 C)  SpO2: 95%  PainSc: 0-No pain    LUNGS: crackles to bases, occasional cough without sputum production, + SOB with exertion.  CARDIAC: Cor RRR}  EXTREMITIES: - for edema SKIN: Skin color, texture, turgor normal. No rashes or lesions or mobility and turgor normal  NEURO: positive for dizziness and weakness       Shane Fanning  Despina Hick, RN

## 2021-06-01 ENCOUNTER — Encounter: Payer: Self-pay | Admitting: Nurse Practitioner

## 2021-06-01 ENCOUNTER — Other Ambulatory Visit: Payer: Self-pay

## 2021-06-01 ENCOUNTER — Other Ambulatory Visit: Payer: Commercial Managed Care - HMO | Admitting: Nurse Practitioner

## 2021-06-01 VITALS — BP 128/70 | HR 60 | Temp 97.5°F | Wt 142.0 lb

## 2021-06-01 DIAGNOSIS — F4321 Adjustment disorder with depressed mood: Secondary | ICD-10-CM

## 2021-06-01 DIAGNOSIS — R0602 Shortness of breath: Secondary | ICD-10-CM

## 2021-06-01 DIAGNOSIS — J84112 Idiopathic pulmonary fibrosis: Secondary | ICD-10-CM

## 2021-06-01 DIAGNOSIS — Z515 Encounter for palliative care: Secondary | ICD-10-CM

## 2021-06-01 NOTE — Addendum Note (Signed)
Addended by: Shawn Stall on: 06/01/2021 03:28 PM   Modules accepted: Level of Service

## 2021-06-01 NOTE — Progress Notes (Signed)
[facility] Consult Note Telephone: [phone]  Fax: [phone]    Date of encounter: 06/01/21 1:54 PM PATIENT NAME: Shane Shane Reid. [place] [address]   [phone] (home)  DOB: 18-Nov-1945 MRN: [id number] PRIMARY CARE PROVIDER:    Elba Barman, MD,  [address] [place] [address] [phone]  RESPONSIBLE PARTY:    Contact Information     Name Relation Home Work Mobile   Shane Shane Reid   [phone]      Due to the COVID-19 crisis, this visit was done via telemedicine from my office and it was initiated and consent by this patient and or family.  I connected with Shane Shane Reid with Shane Shane Reid. [place] PROXY on 06/01/21 by a video enabled telemedicine application and verified that I am speaking with the correct person using two identifiers.   I discussed the limitations of evaluation and management by telemedicine. The patient expressed understanding and agreed to proceed.   I met face to face with patient and family in home connecting virtually with Shane Gather, Reid.  Palliative Care was asked to follow this patient by consultation request of  Wroth, Shane Loh, MD to address advance care planning and complex medical decision making. This is a follow up visit.                                  ASSESSMENT AND PLAN / RECOMMENDATIONS:  Advance Care Planning/Goals of Care: Goals include to maximize quality of life and symptom management. Our advance care planning conversation included a discussion about:     Experiences with loved ones who have been seriously ill or have died  Exploration of personal, cultural or spiritual beliefs that might influence medical decisions  Exploration of goals of care in the event of a sudden injury or illness  Decision not to resuscitate or to de-escalate disease focused treatments due to poor prognosis. CODE STATUS:  DNR  Symptom  Management/Plan: 1. Advance Care Planning; DNR; continue to monitor   2. Shortness of breath secondary to Idiopathic pulmonary fibrosis; continue O2, increasing when severe dyspnea, continue oxycodone as prescribed; discussed at length about inhalation therapy, continue with daily prednisone, percussion vest.    3. Anxiety secondary to secondary to Idiopathic pulmonary fibrosis and grieving with recent loss of wife. Discussed will continue xanax as prescribed. Will re-involve PC SW to explore grief counseling. Shane Shane Reid from room to room with him throughout the day, cries every day. Very sad. We talked about coping strategies. We talked about grieving at length. We talked about managing anxiety to improving breathing. Emotional support provided.    4. Goals of Care: Goals include to maximize quality of life and symptom management. Our advance care planning conversation included a discussion about:    The value and importance of advance care planning  Exploration of personal, cultural or spiritual beliefs that might influence medical decisions  Exploration of goals of care in the event of a sudden injury or illness  Identification and preparation of a healthcare agent  Review and updating or creation of an  advance directive document.   5. Palliative care encounter; Palliative care encounter; Palliative medicine team will continue to support patient, patient's family, and medical team. Visit consisted of counseling and education dealing with the complex and emotionally intense issues of  symptom management and palliative care in the setting of serious and potentially life-threatening illness Safety:  Patient reports a fall in the bathroom and sustaining a skin tear to the right hand.   Patient's balance is unsteady and only has a cane in the home.  Endorses having a rolling walker and wheelchair but they are in storage.  I have encouraged him to notify his Shane Reid to bring this to  the home for safety.  Grief:  Patient continues to grieve the loss of his wife last year.  Patient has been offered bereavement services but he continues to decline.   Diabetes:  Patient reports blood sugars ranging 200 to over 300.  Today his blood sugar is 232.  Patient states he will be following up with Dr. Loma Newton on Friday.  Follow up Palliative Care Visit: Palliative care will continue to follow for complex medical decision making, advance care planning, and clarification of goals. Return 4 weeks or prn.  I spent 46 minutes providing this consultation. More than 50% of the time in this consultation was spent in counseling and care coordination. PPS: 50%  Chief Complaint: Follow up palliative consult for complex medical decision making.  HISTORY OF PRESENT ILLNESS:  Shane Shane Reid. is a 76 y.o. year old male  with  . multiple medical problems including Idiopathic pulmonary fibrosis, coronary artery disease s/p stent, COPD, congestive heart failure, dysphasia, hypertension, heart murmur, diabetes, COPD, asthma, chronic chest pain, gerd, anxiety. F/U PC visit by telemedicine with Shane Shane Reid [facility] Reid, Shane Shane Reid and I. We talked about how Shane Shane Reid has been feeling. We reviewed ros, symptoms, physical exam, medications, symptom management of anxiety, shortness of breath, grieving. We talked about medical goals, currently living situation with hope to move to another apartment. We talked about his support structure in place with family dynamics. We talked about a counselor and Shane Shane Reid was in agreement to think about it. We talked about unsteady gait and importance of using a walker. We talked about fall risk. We talked about role pc in poc. Most of pc visit supportive, f/u visit scheduled. Therapeutic listening, emotional support provided. Questions answered.  History obtained from review of EMR, discussion with Shane Shane Reid with Shane Shane Reid.  I reviewed available labs, medications, imaging,  studies and related documents from the EMR.  Records reviewed and summarized above.   ROS  General: NAD EYES: denies vision changes ENMT: denies dysphagia Cardiovascular: denies chest pain, + DOE Pulmonary: denies cough, denies increased SOB Abdomen: endorses good appetite, denies constipation, endorses continence of bowel GU: denies dysuria, endorses continence of urine MSK:  denies weakness,  + for falls/unsteady balance Skin: denies rashes or wounds Neurological: denies pain, denies insomnia Psych: Endorses positive mood Heme/lymph/immuno: denies bruises, abnormal bleeding  Physical Exam: completed by Shane Shane Reid Current 142 lbs Constitutional: NAD General: frail appearing and thin EYES: lids intact ENMT: intact hearing, oral mucous membranes moist CV: S1S2, RRR, no LE edema Pulmonary: LCTA, no cough, currently on O2 Abdomen: intake 75-100%, normo-active BS + 4 quadrants, soft and non tender MSK: + sarcopenia, ambulatory unsteady gait; walker; +muscle wasting Skin: warm and dry Neuro:  + generalized weakness,  no cognitive impairment Psych: non-anxious affect, A and O x 3  This chart was dictated using voice recognition software.  Despite best efforts to proofread,  errors can occur which can change the documentation meaning.   Questions and concerns were addressed. The patient/family was encouraged to call with questions and/or concerns.  My contact information was provided. Provided general support and encouragement, no other unmet needs identified   Thank you for the opportunity to participate in the care of Shane. [place].  The palliative care team will continue to follow. Please call our office at [phone] if we can be of additional assistance.   Lorenza Burton, Reid   COVID-19 PATIENT SCREENING TOOL Asked and negative response unless otherwise noted:   Have you had symptoms of covid, tested positive or been in contact with someone with symptoms/positive test in the  past 5-10 days?  No

## 2021-06-08 ENCOUNTER — Other Ambulatory Visit: Payer: Commercial Managed Care - HMO | Admitting: Nurse Practitioner

## 2021-06-08 ENCOUNTER — Other Ambulatory Visit: Payer: Self-pay

## 2021-06-08 ENCOUNTER — Encounter: Payer: Self-pay | Admitting: Nurse Practitioner

## 2021-06-08 DIAGNOSIS — F419 Anxiety disorder, unspecified: Secondary | ICD-10-CM

## 2021-06-08 DIAGNOSIS — R0602 Shortness of breath: Secondary | ICD-10-CM

## 2021-06-08 DIAGNOSIS — L0291 Cutaneous abscess, unspecified: Secondary | ICD-10-CM

## 2021-06-08 DIAGNOSIS — Z515 Encounter for palliative care: Secondary | ICD-10-CM

## 2021-06-08 NOTE — Progress Notes (Signed)
[facility] Consult Note Telephone: [phone]  Fax: [phone]    Date of encounter: 06/08/21 7:03 PM PATIENT NAME: Shane Reid [id number]   [phone] (home)  DOB: 1946/03/18 MRN: [id number] PRIMARY CARE PROVIDER:    Elba Barman, MD,  [address] [place] [address] [phone]  RESPONSIBLE PARTY:    Contact Information     Name Relation Home Work Mobile   [place]   [phone]      I met face to face with patient in home. Palliative Care was asked to follow this patient by consultation request of  Wroth, Honor Loh, MD to address advance care planning and complex medical decision making. This is a follow up visit.                                  ASSESSMENT AND PLAN / RECOMMENDATIONS:  Symptom Management/Plan: 1. Advance Care Planning; DNR; continue to monitor   2. Shortness of breath secondary to Idiopathic pulmonary fibrosis; improved, continue O2, increasing when severe dyspnea, continue oxycodone as prescribed; discussed at length about inhalation therapy, continue with daily prednisone, percussion vest.    3. Anxiety secondary to secondary to Idiopathic pulmonary fibrosis and grieving with recent loss of wife improving; Discussed will continue xanax as prescribed.  We talked about managing anxiety to improving breathing. Emotional support provided.   4. Abscess left axillary, started doxycycline, warm moist compresses and f/u with Dr Loma Newton primary if not resolving may need I&D; monitor for fever  Rx: Doxycycline 139m po bid x 10 days; #20; NO RF  Follow up Palliative Care Visit: Palliative care will continue to follow for complex medical decision making, advance care planning, and clarification of goals. Return 8 weeks or prn.  I spent 62 minutes providing this consultation. More than 50% of the time in this consultation was spent in counseling and  care coordination.  PPS: 50%  Chief Complaint: Follow up palliative consult for complex medical decision making  HISTORY OF PRESENT ILLNESS:  Shane Reid is a 76y.o. year old male  with multiple medical problems including Idiopathic pulmonary fibrosis, coronary artery disease s/p stent, COPD, congestive heart failure, dysphasia, hypertension, heart murmur, diabetes, COPD, asthma, chronic chest pain, gerd, anxiety. I called Shane Reid confirm PC f/u visit, covid screening negative. I visited Shane Reid his home. Shane Reid his continuous O2. Shane Reid he is feeling better. We talked about symptoms, ros, no recent falls, wounds, infections, hospitalizations. We talked about chronic disease, management of anxiety, shortness of breath. We talked about abscess under left arm, discussed plan. We talked about medical goals reviewed. We talked about appetite, no further weight loss. We talked about resources Shane Reid been receiving. We talked about current living situation and he believes he will likely stay where her is in this apartment if he can get his car fixed. We talked about family dynamics. We talked about realistic expectations of himself. We talked about role pc in poc. Therapeutic listening, emotional support provided. Discussed f/u visit. Questions answered.   History obtained from review of EMR, discussion with Shane Reid  I reviewed available labs, medications, imaging, studies and related documents from the EMR.  Records reviewed and summarized above.   ROS 10 point review system all negative except HPI  Physical  Exam: Constitutional: NAD General: frail appearing, thin, chronically ill, pleasant male EYES: lids intact ENMT:  oral mucous membranes moist CV: S1S2, RRR, no LE edema Pulmonary: decrease bases, few exp wheezes, +chronic  cough, O2 Abdomen: soft and non tender MSK: ambulatory; muscle wasting Skin: warm and dry; quarter size abscess, no  drainage, painful to touch, warm, erythema Neuro:  + generalized weakness,  no cognitive impairment Psych: non-anxious affect, A and O x 3  Thank you for the opportunity to participate in the care of Shane Reid.  The palliative care team will continue to follow. Please call our office at [phone] if we can be of additional assistance.   Questions and concerns were addressed. The patient/family was encouraged to call with questions and/or concerns. My business card was provided. Provided general support and encouragement, no other unmet needs identified   This chart was dictated using voice recognition software.  Despite best efforts to proofread,  errors can occur which can change the documentation meaning.   Dezirae Service Z Latrece Nitta, NP   COVID-19 PATIENT SCREENING TOOL Asked and negative response unless otherwise noted:   Have you had symptoms of covid, tested positive or been in contact with someone with symptoms/positive test in the past 5-10 days?  NO

## 2021-06-14 ENCOUNTER — Telehealth: Payer: Self-pay

## 2021-06-14 NOTE — Telephone Encounter (Signed)
908 am.  Request from Christin Gusler, NP to follow up with patient regarding an abscess under the left arm.  Visit scheduled for tomorrow at 10 am.

## 2021-06-15 ENCOUNTER — Other Ambulatory Visit: Payer: Commercial Managed Care - HMO

## 2021-06-15 ENCOUNTER — Other Ambulatory Visit: Payer: Self-pay

## 2021-06-15 VITALS — BP 142/80 | HR 57 | Temp 98.1°F | Wt 142.9 lb

## 2021-06-15 DIAGNOSIS — Z515 Encounter for palliative care: Secondary | ICD-10-CM

## 2021-06-15 NOTE — Progress Notes (Signed)
PATIENT NAME: Shane Reid. DOB: Dec 15, 1945 MRN: [id number]  PRIMARY CARE PROVIDER: Mickel Fuchs, MD  RESPONSIBLE PARTY:  Acct ID - Guarantor Home Phone Work Phone Relationship Acct Type  [id number] SABIEN, UMLAND* [phone]  Self P/F     [address] [name], [place] [address]    PLAN OF CARE and INTERVENTIONS:               1.  GOALS OF CARE/ ADVANCE CARE PLANNING:  DNR in place.  Patient desires to remain home and independent.                 2.  PATIENT/CAREGIVER EDUCATION:  Abscess               4. PERSONAL EMERGENCY PLAN:  Activate 911 for emergencies.                5.  DISEASE STATUS:  Visit completed at the request of Christin Gusler, NP to follow up on abscess.  Left axillary abscess:  Redness and bruising present. No warmth noted.  Patient notes he was doing warm compresses and area drained a small amount.  He also poked a needle to this area.  He continues with doxycyline 2 x daily.   Dr. Wyn Quaker has been updated on abscess and current treatment.  No changes at this time.   Patient instructed to follow up with PCP if area worsens.  Patient will continue with warm compresses.  Food: Patient continues with MOW.  He relies on his son to obtain groceries for him but this does not always happen.  Patient has very little food in the home.  Items picked up from the [facility] and brought to patient.  Update provided to Christin Gusler, NP.   HISTORY OF PRESENT ILLNESS:   Shane Reid. is a 76 y.o. year old male  with multiple medical problems including Idiopathic pulmonary fibrosis, coronary artery disease s/p stent, COPD, congestive heart failure, dysphasia, hypertension, heart murmur, diabetes, COPD, asthma, chronic chest pain, gerd, anxiety  CODE STATUS: DNR ADVANCED DIRECTIVES: No MOST FORM: No PPS: 50%   PHYSICAL EXAM:   VITALS: Today's Vitals   06/15/21 1026  BP: (!) 142/80  Pulse: (!) 57  Temp: 98.1 F (36.7 C)  SpO2: 96%  Weight: 142 lb  14.4 oz (64.8 kg)          Truitt Merle, RN

## 2021-06-24 DIAGNOSIS — Z515 Encounter for palliative care: Secondary | ICD-10-CM

## 2021-06-24 NOTE — Progress Notes (Signed)
[facility] SW NOTE  PATIENT NAME: Shane Reid. DOB: 04/18/1946 MRN: [id number]  PRIMARY CARE PROVIDER: Elba Barman, MD  RESPONSIBLE PARTY: There is no guarantor information entered for this encounter.  PC SW outreached patient via telephone.   SW followed up with patient in regard to possible social determinants of health need regarding food.   Patient denied the need of food at this time. Patient shared that his son and DIL has his food stamps and [employer] benefit card and goes to pick up food and bring it to him when they get time. SW advised patient that he can possess his cards him self and order food to be delivered instead of waiting on his sn and DIL. Patient denied this notion stating "I'm fine" and that he has plenty of food right now. Patient continues to receive MOW's as well.   Patient shared during call that he has been having sharp chest pains all morning. Patient took nitro nearly 1 hr ago, the chest pains has decreased but he fears they will start again. Patient stated that his breathing is good and knows when he is pushing himself to hard and when to take a break. Patient has liquid codeine, oxycodone and zanax that he can take to assist with any anxious feelings. Patient denied going to the hospital or doctors office and states that he will be fine.  SW implemented empathetic response and reflective listening to patient during call as he shared that his family rarely calls to just check on him and he tends to walk up the street to the local corner store to just chat. Patient shared that his son has promised to get his car fixed but this has yet to happen. Patient shared that he enjoys doing cross word puzzles and large puzzles, and would like the [employer] prayer puzzle to complete, as this was his wife's favorite.   Patient share that he has a f/u PC encounter with PC NP on 2/15 and is aware that he can outreach PC at anytime prior to that appt for  assistance/needs.  PC Provided general support and encouragement, no other unmet needs identified. Will continue to follow.     SOCIAL HX:  Social History   Tobacco Use   Smoking status: Former    Packs/day: 0.50    Years: 1.00    Pack years: 0.50    Types: Cigarettes    Quit date: 05/09/1968    Years since quitting: 53.1   Smokeless tobacco: Never  Substance Use Topics   Alcohol use: No         [place] Henrene Pastor, [place]

## 2021-07-01 ENCOUNTER — Telehealth: Payer: Self-pay

## 2021-07-01 NOTE — Telephone Encounter (Signed)
340 pm.  Message received to contact patient due to shortness of breath.  Return call made to patient who advised that he walked to the store this afternoon and was very short of breath.  Blood pressure was 138/80 and O2 sats 96% and blood sugar 268.  Patient states he took xanax and oxycodone to address his shortness of breath.  Breathing has improved.  Patient is currently without his prednisone.  Medication is ready for pick up but patient's son has not picked it up yet.   Patient is going to contact his son to advise medication needs to be picked up as soon as possible.   No other needs at this time.

## 2021-07-04 ENCOUNTER — Other Ambulatory Visit: Payer: Medicare Other

## 2021-07-04 ENCOUNTER — Other Ambulatory Visit: Payer: Self-pay

## 2021-07-04 DIAGNOSIS — Z515 Encounter for palliative care: Secondary | ICD-10-CM

## 2021-07-04 NOTE — Progress Notes (Signed)
PATIENT NAME: Shane Reid. DOB: 09-29-1945 MRN: [id number]  PRIMARY CARE PROVIDER: Mickel Fuchs, MD  RESPONSIBLE PARTY:  Acct ID - Guarantor Home Phone Work Phone Relationship Acct Type  [id number] DOMINIQUE, CALVEY* [phone]  Self P/F     [address] [name], [place] [address]   Due to the COVID-19 crisis, this visit was done via telemedicine from my office and it was initiated and consent by this patient and or family.  I connected with  Shane Reid. OR PROXY on 07/04/21 by telephone and verified that I am speaking with the correct person using two identifiers.   I discussed the limitations of evaluation and management by telemedicine. The patient expressed understanding and agreed to proceed.   PLAN OF CARE and INTERVENTIONS:               1.  GOALS OF CARE/ ADVANCE CARE PLANNING:  Remain home and independent for as long as possible.                2.  PATIENT/CAREGIVER EDUCATION:  Medications and blood sugars               4. PERSONAL EMERGENCY PLAN:  Activate 911 for emergencies.               5.  DISEASE STATUS:  Chest Pain:  Patient reports chest pain over the weekend.  He endorses taking a nitro tablet which was effective in relieving pain.  Dizziness:  Patient continues to report spells of dizziness.  No falls reported.  Patient is using a cane for safe ambulation.   DM:  Blood sugars are being checked periodically.  Last blood sugar was 263 on Friday.  Patient states he has not checked it since last week. Encouraged patient to continue to routinely as ordered.   Shortness of breath:  Patient endorses shortness of breath.  He continues with breathing treatments 3 x daily.   When shortness of breath is severe he is taking a xanax and oxycodone.  Medication Management:  Patient endorses having his prednisone filled and is in the home.  He will be out of his xanax and oxycodone soon but he contacted PCP today and medication scripts are being sent to his  pharmacy.    HISTORY OF PRESENT ILLNESS:  Osualdo Hansell. is a 76 y.o. year old male  with multiple medical problems including Idiopathic pulmonary fibrosis, coronary artery disease s/p stent, COPD, congestive heart failure, dysphasia, hypertension, heart murmur, diabetes, COPD, asthma, chronic chest pain, gerd, anxiety  CODE STATUS: DNR ADVANCED DIRECTIVES: No MOST FORM: No PPS: 50%        Truitt Merle, RN

## 2021-07-12 ENCOUNTER — Other Ambulatory Visit: Payer: Self-pay

## 2021-07-12 ENCOUNTER — Encounter: Payer: Self-pay | Admitting: Nurse Practitioner

## 2021-07-12 ENCOUNTER — Other Ambulatory Visit: Payer: Medicare Other | Admitting: Nurse Practitioner

## 2021-07-12 DIAGNOSIS — Z515 Encounter for palliative care: Secondary | ICD-10-CM

## 2021-07-12 DIAGNOSIS — R0602 Shortness of breath: Secondary | ICD-10-CM

## 2021-07-12 DIAGNOSIS — F419 Anxiety disorder, unspecified: Secondary | ICD-10-CM

## 2021-07-12 DIAGNOSIS — J84112 Idiopathic pulmonary fibrosis: Secondary | ICD-10-CM

## 2021-07-12 NOTE — Progress Notes (Signed)
Therapist, nutritional Palliative Care Consult Note Telephone: [phone]  Fax: [phone]    Date of encounter: 07/12/21 1:48 PM PATIENT NAME: Shane Reid. [address] [place] [address]   [phone] (home)  DOB: 1945/09/07 MRN: [id number] PRIMARY CARE PROVIDER:    Mickel Fuchs, MD,  [address] [place] [address] [phone] RESPONSIBLE PARTY:    Contact Information     Name Relation Home Work Mobile   [place] Son   [phone]      Due to the COVID-19 crisis, this visit was done via telemedicine from my office and it was initiated and consent by this patient and or family.  I connected with  Shane Reid. OR PROXY on 07/12/21 by a telephone as video not available enabled telemedicine application and verified that I am speaking with the correct person using two identifiers.   I discussed the limitations of evaluation and management by telemedicine. The patient expressed understanding and agreed to proceed. Palliative Care was asked to follow this patient by consultation request of  Wroth, Sydnee Cabal, MD to address advance care planning and complex medical decision making. This is a follow up visit.                                  ASSESSMENT AND PLAN / RECOMMENDATIONS:  Symptom Management/Plan: 1. Advance Care Planning; DNR; continue to monitor   2. Shortness of breath secondary to Idiopathic pulmonary fibrosis; continues to improve, continue O2, increasing when severe dyspnea, continue oxycodone as prescribed; discussed at length about inhalation therapy, continue with daily prednisone; we talked about mobility, ambulating; Shane Reid endorses he spends most time in the apartment.    3. Anxiety secondary to secondary to Idiopathic pulmonary fibrosis, COPD; continues to improve. Counseling done, coping strategies, Continue xanax as prescribed by Shane Reid which has been helping considerable.  We talked  about managing anxiety to improving breathing. Emotional support provided.   4. 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 Receiving meals on wheels. Discussed blood sugars have been running 200's at times. Shane Reid endorses it has been from meals he has been eating from meals on wheels. We talked about meals, healthy choices, dm education completed.   Follow up Palliative Care Visit: Palliative care will continue to follow for complex medical decision making, advance care planning, and clarification of goals. Return 8 for Palliative NP then 4 weeks for [facility] RN or prn.  I spent 47 minutes providing this consultation. More than 50% of the time in this consultation was spent in counseling and care coordination. PPS: 50% Chief Complaint: Follow up palliative consult for complex medical decision making  HISTORY OF PRESENT ILLNESS:  Shane Batiz. is a 76 y.o. year old male  with multiple medical problems including Idiopathic pulmonary fibrosis, coronary artery disease s/p stent, COPD, congestive heart failure, dysphasia, hypertension, heart murmur, diabetes, COPD, asthma, chronic chest pain, gerd, anxiety. I called Shane Reid for telemedicine telephonic as video not available for f/u PC visit. We talked about how Shane Reid has been feeling. Shane Reid endorses he is feeling better, overall doing much better, blood sugars have been running some high but will come down to 100's. We talked about food, appetite, nutrition, weight gain. We talked about fatigue and shortness of breath. We talked about endurance, walking from one  room to the next and becoming sob, having to rest. We talked about daily routine. We talked about symptoms, ros, no recent falls, wounds, infections, hospitalizations. We talked about chronic disease, management of anxiety, shortness of breath. We talked about abscess under left arm, resolved. We talked about medical goals reviewed. We  talked about staying in the apartment he is currently residing in. We talked about family dynamics. We talked about realistic expectations of himself. We talked about role pc in poc. Therapeutic listening, emotional support provided. Discussed f/u visit. Questions answered.    History obtained from review of EMR, discussion with Shane Reid.  I reviewed available labs, medications, imaging, studies and related documents from the EMR.  Records reviewed and summarized above.   ROS 10 point system reviewed with Shane Reid all negative except HPI  Physical Exam: Deferred  Thank you for the opportunity to participate in the care of Shane Reid.  The palliative care team will continue to follow. Please call our office at [phone] if we can be of additional assistance.   Shane Greenberger [place], NP

## 2021-07-14 ENCOUNTER — Other Ambulatory Visit: Payer: Medicare Other | Admitting: Nurse Practitioner

## 2021-08-01 ENCOUNTER — Other Ambulatory Visit: Payer: Medicare Other

## 2021-08-01 DIAGNOSIS — Z515 Encounter for palliative care: Secondary | ICD-10-CM

## 2021-08-01 NOTE — Progress Notes (Signed)
PATIENT NAME: Shane Reid. ?DOB: 03-13-1946 ?MRN: [id number] ? ?PRIMARY CARE PROVIDER: Mickel Fuchs, MD ? ?RESPONSIBLE PARTY:  ?Acct ID - Guarantor Home Phone Work Phone Relationship Acct Type  ?[id number] Shane Reid* [phone]  Self P/F  ?   [address] [name], [place] [address]  ? ?Due to the COVID-19 crisis, this visit was done via telemedicine from my office and it was initiated and consent by this patient and or family. ? ?I connected with  Shane Reid. OR PROXY on 08/01/21 by telephone and verified that I am speaking with the correct person using two identifiers. ?  ?I discussed the limitations of evaluation and management by telemedicine. The patient expressed understanding and agreed to proceed.  ? ?PLAN OF CARE and INTERVENTIONS: ?              1.  GOALS OF CARE/ ADVANCE CARE PLANNING:  Remain home and independent for as long as possible.  ?              2.  PATIENT/CAREGIVER EDUCATION:  Diabetes ?              4. PERSONAL EMERGENCY PLAN:  Activate 911 for emergencies.  ?              5.  DISEASE STATUS: ? ?Return call made to patient.  He advises his blood sugar was 309 this am.  He has rechecked his sugar while we are on the phone and it is 166.  Patient reports a recent fall in the bathroom.  No injuries reported.  Patient continues to have periods of dizziness.  Currently out of oxycodone but has contacted his PCP.  Weight is 145 lbs.  Continues with MOW.  Still having some trouble with family bringing groceries to him.   Visit scheduled for next week 3/16 @ 9 am.  Pearletha Furl, NP updated on this phone call.  ? ?HISTORY OF PRESENT ILLNESS:   Shane Reid. is a 76 y.o. year old male  with multiple medical problems including Idiopathic pulmonary fibrosis, coronary artery disease s/p stent, COPD, congestive heart failure, dysphasia, hypertension, heart murmur, diabetes, COPD, asthma, chronic chest pain, gerd, anxiety.  Patient is being followed by Palliative Care every  4-8 weeks and PRN.  ? ?CODE STATUS: DNR ?ADVANCED DIRECTIVES: No ?MOST FORM: No ?PPS: 50% ? ? ? ? ? ? ?Truitt Merle, RN ? ?

## 2021-08-02 ENCOUNTER — Other Ambulatory Visit: Payer: Self-pay

## 2021-08-08 ENCOUNTER — Telehealth: Payer: Self-pay

## 2021-08-08 NOTE — Telephone Encounter (Signed)
330 pm.  Message received from patient requesting a call back.   Patient confirmed our appointment for Thursday at 9 am.  States he recently saw his provider and was started on Actos.  His first dose was today.  Last blood sugar checked yesterday was 147.  I requested he check his blood sugar while we are on the phone.  Blood sugar reading is 156. No other concerns voiced at this time.  Patient will be seen Thursday. ?

## 2021-08-09 ENCOUNTER — Other Ambulatory Visit: Payer: Self-pay | Admitting: Cardiovascular Disease

## 2021-08-11 ENCOUNTER — Other Ambulatory Visit: Payer: Medicare Other

## 2021-08-11 ENCOUNTER — Other Ambulatory Visit: Payer: Self-pay

## 2021-08-11 ENCOUNTER — Telehealth: Payer: Self-pay

## 2021-08-11 VITALS — BP 122/70 | HR 66 | Temp 97.7°F | Resp 24 | Wt 146.0 lb

## 2021-08-11 NOTE — Progress Notes (Signed)
PATIENT NAME: [name] ?DOB: [birth year] ?MRN: [id number] ? ?PRIMARY CARE PROVIDER: Mickel Fuchs, MD ? ?RESPONSIBLE PARTY:  ?Acct ID - Guarantor Home Phone Work Phone Relationship Acct Type  ?[id number] TIERRA, DIVELBISS* [phone]  Self P/F  ?   [address] [name], [place] [address]  ? ? ?PLAN OF CARE and INTERVENTIONS: ?              1.  GOALS OF CARE/ ADVANCE CARE PLANNING:  Patient is a DNR.  Desires to remain home and independent with assistance of his son.  ?              2.  PATIENT/CAREGIVER EDUCATION:  Home Podiatry ?              4. PERSONAL EMERGENCY PLAN:  Activate 911 for emergencies. ?              5.  DISEASE STATUS: ? ?Diabetes:  Patient was recently started on Actos.  Blood sugars have improved.  Today's reading was 114.  Patient reports yesterday evening, reading was over 200 but he could not remember if he obtained this after eating.  ? ?Neuropathy:  Patient reports numbness at times to his left great toe. Having some new pain to his left great toe possibly in-grown toe nail.  SW will [facility] that does home visits.  ? ?Medication Management:  Patient advised he has called in refills for medications.   Reviewed medication profile.  Patient is taking Jardiance 25 mg daily, Quetiapine Fumarate 25 mg at bedtime,pioglitazone 30 mg daily, Amlodipine 5 mg daily, Docusate 100 mg daily that are not listed on medication profile.  ? ?Shortness of breath:  Patient has ongoing dyspnea.  Using nebulizer as ordered.  Has tessalon pearls and cough medication he takes as needed.  Continues with mucous production mostly white in color and sometimes greyish in color.  ? ?Safety:  No falls reported this week.  Ongoing education provided on use of cane for stability.  ? ?Update provided to Pearletha Furl, NP.  ? ?HISTORY OF PRESENT ILLNESS:   ?Nakia Koble. is a 76 y.o. year old male  with multiple medical problems including Idiopathic pulmonary fibrosis, coronary artery  disease s/p stent, COPD, congestive heart failure, dysphasia, hypertension, heart murmur, diabetes, COPD, asthma, chronic chest pain, gerd, anxiety ? ?CODE STATUS: DNR ?ADVANCED DIRECTIVES: No ?MOST FORM: No ?PPS: 50% ? ? ?PHYSICAL EXAM:  ? ?VITALS: ?Today's Vitals  ? 08/11/21 0843  ?BP: 122/70  ?Pulse: 66  ?Resp: (!) 24  ?Temp: 97.7 ?F (36.5 ?C)  ?SpO2: 92%  ?Weight: 146 lb (66.2 kg)  ?PainSc: 0-No pain  ?  ?LUNGS:  rales present to bilateral lower lobes rhonchi present to right lower lobe ?CARDIAC: Cor RRR}  ?EXTREMITIES: - for edema ?SKIN: Skin color, texture, turgor normal. No rashes or lesions or mobility and turgor normal  ?NEURO: positive for dizziness and gait problems ? ? ? ? ? ? ?Truitt Merle, RN ? ?

## 2021-08-11 NOTE — Progress Notes (Signed)
COMMUNITY PALLIATIVE CARE SW NOTE ? ?PATIENT NAME: Shane Reid. ?DOB: [birth year] ?MRN: [id number] ? ?PRIMARY CARE PROVIDER: Elba Barman, MD ? ?RESPONSIBLE PARTY:  ?Acct ID - Guarantor Home Phone Work Phone Relationship Acct Type  ?[id number] LORENZA, [id number]  Self P/F  ?   [address] [place], [place] [address]  ? ? ? ?PLAN OF CARE and INTERVENTIONS:             ? ?GOALS OF CARE/ ADVANCE CARE PLANNING: Goals include to maximize quality of life and symptom management. Our advance care planning conversation included a discussion about:    ?The value and importance of advance care planning  ?Review and updating or creation of an advance directive document.  ?  ?Patient is currently a DNR. Patient's son, [name], holds POA over him. Patient does not have a Will.  ?  ?Palliative care encounter: Palliative medicine team will continue to support patient, patient's family, and medical team. Visit consisted of counseling and education dealing with the complex and emotionally intense issues of symptom management and palliative care in the setting of serious and potentially life-threatening illness. ?SW and RN met with patient in home visit. Patient lives in one story home alone.  ? ?Functional changes/updates: Patient has not had any functional changes since his recent PC visit. Patients continues to suffer from Chronic respiratory failure and COPD. Patient was sitting in living during visit. Patient denies recent falls. Fall and safety precautions discussed due to O2 tubing on floor. Patient continues to be ambulatory w/o AD.  Patient continues to be INDP with all ADL's.  ? ?Home & Environment assessment: No concerns. Patient feels more safe in his and home environment. Patient shares that he now walks around his neighborhood and walks to the local convenient store. Patients home is one level. Patient is able to maneuver around her home without issue.  ? ?Protein calorie malnutrition/weight  loss/Appetite: Patient's appetite is fair. Patients current weight is 147lbs. Patient is sleeping well at night. ? ?Psychosocial assessment: completed. No additional physical needs identified at this time. Patient receives MOW's and son goes grocery shopping other needs. Patient receives food stamps and UHC u card as well that he can use to purchase food and OTC medications. Patient continues to decline counseling services. Patient shares that he has two male friends that he met on face book which he enjoys talking to patient share that he is cautious to only having conversations and not offer or give money. Ongoing support/resources will continued to be offered if needed.  ? ?Military hx: N/A  ? ?Medical assessment: RN reviewed medications and took vitals.  ? ?Hospice discussion: N/A ? ?SW discussed goals, reviewed care plan, provided emotional support, used active and reflective listening in the form of reciprocity emotional response. Questions and concerns were addressed. The patient/family was encouraged to call with any additional questions and/or concerns. PC Provided general support and encouragement, no other unmet needs identified. Will continue to follow. ? ?3.         PATIENT/CAREGIVER EDUCATION/ COPING:   ?Appearance: well groomed, appropriate given situation  ?Mental Status: Alert/oriented. ?Eye Contact: Good. Able to engage in proper eye contact  ?Thought Process: rational  ?Thought Content: not assessed  ?Speech: Normal rate, volume, tone  ?Mood: Normal and calm ?Affect: Congruent to endorsed mood, full ranging ?Insight: normal ?Judgement: normal  ?Interaction Style: Cooperative ?Patient A&O, patient engaged in fluent conversation and answered all questions appropriately. Patients mood was jovial during  visit today. PHQ 9 is 0. Patient does suffer from anxiety which he takes Xanax daily for. Patient enjoys sitting outside and taking dog for walks. ? ?4.         PERSONAL EMERGENCY PLAN:  Patient will  call 9-1-1 for emergencies.   ? ?5.         COMMUNITY RESOURCES COORDINATION/ HEALTH CARE NAVIGATION:  Patient manages his care. ? ?6.      FINANCIAL CONCERNS/NEEDS: None. ?                       Primary Health Insurance: [facility] Medicare ?Secondary Health Insurance: Medicaid ?Prescription Coverage: Yes, no history of difficulty obtaining or affording prescriptions reported ?   ? ?SOCIAL HX:  ?Social History  ? ?Tobacco Use  ? Smoking status: Former  ?  Packs/day: 0.50  ?  Years: 1.00  ?  Pack years: 0.50  ?  Types: Cigarettes  ?  Quit date: 05/09/1968  ?  Years since quitting: 53.2  ? Smokeless tobacco: Never  ?Substance Use Topics  ? Alcohol use: No  ? ? ?CODE STATUS: DNR ?ADVANCED DIRECTIVES: Y ?MOST FORM COMPLETE:  Y ?HOSPICE EDUCATION PROVIDED: N ? ?PPS:  ?Patient is INDP with all ADL's at this time. Patient is A&O with good insight and judgement.  ? ? ?Time spent: 45 min ? ?Doreene Eland, LCSW ? ?

## 2021-08-11 NOTE — Telephone Encounter (Signed)
335 pm.  Update provided to Pearletha Furl, NP on today's visit.   She has requested I follow up with patient regarding a provider that makes home visit.  Spoke with patient regarding this and he is open to having a provider that sees him in his home.  Son is currently taking off from work to drive him there, return to work and then come back to PCP office to pick patient up and return him home.  Patient would want to ensure his xanax and oxycodone would be filled by a new provider.  Advised that I would notify NP of this request.   Christin Gusler, NP updated on above.  ?

## 2021-08-12 ENCOUNTER — Telehealth: Payer: Self-pay

## 2021-08-12 NOTE — Telephone Encounter (Signed)
857 am.  Phone call made to [facility] to follow up on services.  Spoke with Cassandria Santee who advised they do medication refills for patient.  I have provided contact information for patient.  [facility] will contact patient to provide additional information on services. ? ?9 am.  Phone call made to patient to expect a call from [facility] as they will provide additional information on their services.  Patient verbalized understanding and stated he is willing to change to a provider that makes home visits as long a medications can continue to be refilled. ? ?Christin Gusler, NP updated on above.  ?

## 2021-08-23 ENCOUNTER — Other Ambulatory Visit: Payer: Medicare Other

## 2021-08-23 ENCOUNTER — Other Ambulatory Visit: Payer: Self-pay

## 2021-08-23 DIAGNOSIS — Z515 Encounter for palliative care: Secondary | ICD-10-CM

## 2021-08-23 NOTE — Progress Notes (Signed)
PATIENT NAME: Shane Reid. ?DOB: 09/13/1945 ?MRN: [id number] ? ?PRIMARY CARE PROVIDER: Mickel Fuchs, MD ? ?RESPONSIBLE PARTY:  ?Acct ID - Guarantor Home Phone Work Phone Relationship Acct Type  ?[id number] MELISSA, PULIDO* [phone]  Self P/F  ?   [address] [name], [place] [address]  ? ?Due to the COVID-19 crisis, this visit was done via telemedicine from my office and it was initiated and consent by this patient and or family. ? ?I connected with  Shane Reid. OR PROXY on 08/23/21 by telephone and verified that I am speaking with the correct person using two identifiers. ?  ?I discussed the limitations of evaluation and management by telemedicine. The patient expressed understanding and agreed to proceed.  ? ?PLAN OF CARE and INTERVENTIONS: ?              1.  GOALS OF CARE/ ADVANCE CARE PLANNING:  Remain home with the assistance of family.  ?              2. PERSONAL EMERGENCY PLAN:  Activate 911 for emergencies.  ?              3.  DISEASE STATUS:  Incoming call from patient to provide and update on overall condition.  Advised family is bringing in puzzles every 2 weeks.  Patient states this has been helpful to keep him busy and to keep his depression at bay.  Patient is currently out of his oxycodone but has contacted his provider for a refill.  Other medications were picked up by his son.  Patient continues with routine blood sugar checks.  Blood sugar has been higher this week.  Ranging from 157-250.  [employer] is delivering hot meals again.  Patient advised he just received his meal.  Po intake remains good and there has been no changes with his weight.  Patient advised [facility] will be seeing him next week.  Advised that I would follow up next week with a phone call to complete a check in.  ? ? ?HISTORY OF PRESENT ILLNESS:   Shane Reid. is a 76 y.o. 76 y.o.  with multiple medical problems including Idiopathic pulmonary fibrosis, coronary artery disease s/p  stent, COPD, congestive heart failure, dysphasia, hypertension, heart murmur, diabetes, COPD, asthma, chronic chest pain, gerd, anxiety.  Patient is being followed by Palliative Care every 4-8 weeks and PRN.  ? ?CODE STATUS: DNR ?ADVANCED DIRECTIVES: No ?MOST FORM: No ?PPS: 50% ? ? ? ? ? ? ? ?Truitt Merle, RN ? ?

## 2021-09-01 ENCOUNTER — Other Ambulatory Visit: Payer: Medicare Other

## 2021-09-01 DIAGNOSIS — Z515 Encounter for palliative care: Secondary | ICD-10-CM

## 2021-09-01 NOTE — Progress Notes (Signed)
PATIENT NAME: Shane Reid. ?DOB: 1945/06/17 ?MRN: [id number] ? ?PRIMARY CARE PROVIDER: Mickel Fuchs, MD ? ?RESPONSIBLE PARTY:  ?Acct ID - Guarantor Home Phone Work Phone Relationship Acct Type  ?[id number] Shane Reid, CAMINO* [phone]  Self P/F  ?   [address] [name], [place] [address]  ? ?I connected with  Shane Reid. on 09/01/21 by telephone application and verified that I am speaking with the correct person using two identifiers. ?  ?I discussed the limitations of evaluation and management by telemedicine. The patient expressed understanding and agreed to proceed.  ?PLAN OF CARE and INTERVENTIONS: ?              1.  GOALS OF CARE/ ADVANCE CARE PLANNING:  Patient desires to remain home and independent for as long as possible.  ?              2.  PATIENT/CAREGIVER EDUCATION:  Transportation ?              4. PERSONAL EMERGENCY PLAN:  Activate 911 for emergencies ?              5.  DISEASE STATUS: ? ?Call made to patient to follow up on [facility] visit.  Patient advised he was seen this week and has established services. [facility] will do a follow up in 2 weeks.   Patient is in need of transportation.  He went to [employer] off [address] and tried to walk back home.  He was able to contact his son who picked him up.  Patient was trying to a cab the services were closed due to a shortage of drivers.  It does not look like [employer] comes close to patient's current location.   I will contact SW regarding options for transportation.  Patient advised he does not need any refills at this time.  ? ? ? ?HISTORY OF PRESENT ILLNESS:   ? Shane Devan. is a 76 y.o. year old male  with multiple medical problems including Idiopathic pulmonary fibrosis, coronary artery disease s/p stent, COPD, congestive heart failure, dysphasia, hypertension, heart murmur, diabetes, COPD, asthma, chronic chest pain, gerd, anxiety.  Patient is being followed by Palliative Care every 4-8 weeks and  PRN.  ? ?CODE STATUS: DNR ?ADVANCED DIRECTIVES: N ?MOST FORM: No ?PPS: 50% ? ? ? ? ? ? ? ? ?Truitt Merle, RN ? ?

## 2021-09-13 ENCOUNTER — Telehealth: Payer: Self-pay

## 2021-09-13 NOTE — Telephone Encounter (Signed)
838 am.  Message received from patient wanting to cancel his appointment on Thursday as he now has [facility].  Spoke with Margarita Grizzle at [facility] and there is no issue with PC remaining in place.  Spoke patient and explained both [facility] and Palliative Care can be involved in his care.  Patient states he does want to continue with Palliative Care. We will keep the appointment for Thursday. ? ?Update provided to Natalia Leatherwood, NP.  ?

## 2021-09-13 NOTE — Telephone Encounter (Signed)
258 pm.  Incoming call from patient.  Power is currently out in his apartment and will be off till 5 pm.  He is currently using his portable tank but this will not last until 5.  He does not have a back up tank in the home.  Patient provided name of [facility] Specialist and phone number.  Advised that I would contact them regarding a back up tank.  Phone call made to DME provider and advised patient was in need of a back up tank.  Provider will not be able to deliver this until tomorrow.  Phone call made to patient and he will utilize his daughter-in-law's portable tank and she will be able to re-charge his.  Patient may also need to stay at his son's home with concentrator being brought there as patient just received a new message stating it would be 715 pm before power is restored. Patient aware that back up tank will be delivered by [facility] Specialist tomorrow and they will call before they arrive.  ?

## 2021-09-14 ENCOUNTER — Telehealth: Payer: Self-pay

## 2021-09-14 NOTE — Telephone Encounter (Signed)
915 am.  Incoming call from patient.  He advised that [facility] Specialist arrived at the home at 5 pm yesterday with his back up tank.  Patient advised he was without oxygen for about 1 hour.  Power turned back on about the same time back up oxygen arrived. Patient advised he is doing fine and no issues with breathing.  ?

## 2021-09-15 ENCOUNTER — Other Ambulatory Visit: Payer: Medicare Other | Admitting: Nurse Practitioner

## 2021-09-19 ENCOUNTER — Other Ambulatory Visit: Payer: Medicare Other | Admitting: Nurse Practitioner

## 2021-09-19 ENCOUNTER — Encounter: Payer: Self-pay | Admitting: Nurse Practitioner

## 2021-09-19 DIAGNOSIS — Z515 Encounter for palliative care: Secondary | ICD-10-CM

## 2021-09-19 DIAGNOSIS — F419 Anxiety disorder, unspecified: Secondary | ICD-10-CM

## 2021-09-19 DIAGNOSIS — R0602 Shortness of breath: Secondary | ICD-10-CM

## 2021-09-19 DIAGNOSIS — J84112 Idiopathic pulmonary fibrosis: Secondary | ICD-10-CM

## 2021-09-19 NOTE — Progress Notes (Signed)
? ? ?Manufacturing engineer ?[facility] Consult Note ?Telephone: [phone]  ?Fax: [phone]  ? ? ?Date of encounter: 09/19/21 ?8:07 PM ?PATIENT NAME: Shane Reid. ?[place] [address]   ?[phone] (home)  ?DOB: [birth year] ?MRN: [id number] ?PRIMARY CARE PROVIDER:   [facility] ?Elba Barman, MD,  ?[place] ?[place] [address] ?[phone] ? ?RESPONSIBLE PARTY:    ?Contact Information   ? ? Name Relation Home Work Mobile  ? Rodgers, Likes Son   [phone]  ? ?  ? ?I met face to face with patient in facility. Palliative Care was asked to follow this patient by consultation request of [facility] to address advance care planning and complex medical decision making. This is a follow up visit.                                  ?ASSESSMENT AND PLAN / RECOMMENDATIONS:  ?Symptom Management/Plan: ?1. Advance Care Planning; DNR; continue to monitor ?  ?2. Shortness of breath secondary to Idiopathic pulmonary fibrosis; continues to improve, continue O2, increasing when severe dyspnea, continue oxycodone as prescribed; discussed at length about inhalation therapy, continue with continuous O2, he talked about mobility, ambulating; Shane Reid endorses he spends most time in the apartment.  ?  ?3. Anxiety secondary to secondary to Idiopathic pulmonary fibrosis, COPD; continues to improve. Counseling done, coping strategies, Continue xanax continues to helping considerable.  We talked about managing anxiety to improving breathing. Emotional support provided.  ?  ?4. 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 Receiving meals on wheels. We talked about nutrition, monitoring his blood sugars, continue to monitor weights ?Follow up Palliative Care Visit: Palliative care will continue to follow for complex medical decision making, advance care planning, and clarification of goals. Return 6 weeks or prn. ? ?I spent 41 minutes  providing this consultation. More than 50% of the time in this consultation was spent in counseling and care coordination. ? ?PPS: 50% ? ?Chief Complaint: Follow up palliative consult for complex medical decision making ? ?HISTORY OF PRESENT ILLNESS:  Shane Reid. is a 76 y.o. year old male  with multiple medical problems including Idiopathic pulmonary fibrosis, coronary artery disease s/p stent, COPD, congestive heart failure, dysphasia, hypertension, heart murmur, diabetes, COPD, asthma, chronic chest pain, gerd, anxiety. I called Shane Reid to confirm in person PC visit, Shane Reid in agreement. We talked about how Shane Reid has been feeling. Shane Reid endorses he is feeling better, overall doing much better, blood sugars have been running some high which [facility] has been adjusting. We talked about food, appetite, nutrition, weight gain. We talked about fatigue and shortness of breath. We talked about endurance, walking from one room to the next and becoming sob, having to rest though has been improved. Shane Reid endorses he is able to walk to the dumpster to dump trash and mailbox with his O2 tubing. We talked about daily routine. We talked about symptoms, ros, no recent falls, wounds, infections, hospitalizations. We talked about chronic disease, management of anxiety, shortness of breath. We talked about abscess under left arm, resolved. We talked about medical goals reviewed. We talked about staying in the apartment he is currently residing in. We talked about family dynamics. We talked about realistic expectations of himself. We talked about Shane Reid working on his puzzles; he has been doing many  pieces. Praised Shane Reid for getting a hobbies he appears very content. We talked about role pc in poc. Therapeutic listening, emotional support provided. Discussed f/u visit. Questions answered.    ? ?History obtained from review of EMR, discussion with Shane Reid.  ?I reviewed available labs,  medications, imaging, studies and related documents from the EMR.  Records reviewed and summarized above.  ? ?ROS ?10 point system reviewed all negative except HPI ? ?Physical Exam: ?Constitutional: NAD ?General: frail appearing, thin, pleasant male ?EYES: lids intact ?ENMT: oral mucous membranes moist ?CV: S1S2, RRR ?Pulmonary: Crackles bases, no increased work of breathing, no cough ?Abdomen: soft and non tender ?MSK: ambulatory; +muscle wasting ?Skin: warm and dry ?Neuro:  no generalized weakness,  no cognitive impairment ?Psych: non-anxious affect, A and O x 3 ?Thank you for the opportunity to participate in the care of Shane Reid.  The palliative care team will continue to follow. Please call our office at [phone] if we can be of additional assistance.  ? ?Lemya Greenwell Z Cornellius Kropp, NP  ? ?COVID-19 PATIENT SCREENING TOOL ?Asked and negative response unless otherwise noted:  ? ?Have you had symptoms of covid, tested positive or been in contact with someone with symptoms/positive test in the past 5-10 days? NO   ?

## 2021-09-23 ENCOUNTER — Telehealth: Payer: Self-pay

## 2021-09-23 NOTE — Telephone Encounter (Signed)
255 pm.  Request received from Christin Gusler, NP to contact Dr. Anthonette Legato office regarding [facility] involvement as patient is mostly homebound and has limited transportation.  Message left with receptionist regarding above.  ?

## 2021-10-07 ENCOUNTER — Other Ambulatory Visit: Payer: Medicare Other

## 2021-10-07 DIAGNOSIS — Z515 Encounter for palliative care: Secondary | ICD-10-CM

## 2021-10-07 NOTE — Progress Notes (Signed)
PATIENT NAME: Shane Reid. ?DOB: 05-13-1946 ?MRN: [id number] ? ?PRIMARY CARE PROVIDER: Elba Barman, MD ? ?RESPONSIBLE PARTY:  ?Acct ID - Guarantor Home Phone Work Phone Relationship Acct Type  ?[id number] LAVONTE, [phone]  Self P/F  ?   [address] [place], [place] [address]  ? ? ?Patient calling in due to weight gain. Up to 149 lbs.  He does not feel his breathing is any worse and clothes are fitting the same.   Uncertain if scale is off or patient is truly experiencing weight gain.  Advised visit would be made on Monday for assessment to be completed.  ? ? ? ? ? ?Lorenza Burton, RN ? ?

## 2021-10-10 ENCOUNTER — Other Ambulatory Visit: Payer: Medicare Other

## 2021-10-10 VITALS — BP 120/70 | HR 73 | Temp 98.3°F | Resp 36 | Wt 146.4 lb

## 2021-10-10 DIAGNOSIS — Z515 Encounter for palliative care: Secondary | ICD-10-CM

## 2021-10-10 NOTE — Progress Notes (Addendum)
PATIENT NAME: Shane Reid. ?DOB: 16-Mar-1946 ?MRN: [id number] ? ?PRIMARY CARE PROVIDER: Mickel Fuchs, MD ? ?RESPONSIBLE PARTY:  ?Acct ID - Guarantor Home Phone Work Phone Relationship Acct Type  ?[id number] [name], STUCK* [phone]  Self P/F  ?   [address] [name], [name]  ? ? ?PLAN OF CARE and INTERVENTIONS: ?              1.  GOALS OF CARE/ ADVANCE CARE PLANNING:  Remain home and independent for as long as possible.  ?              2.  PATIENT/CAREGIVER EDUCATION:  Diabetes.  ?              4. PERSONAL EMERGENCY PLAN:  Activate 911 for emergencies. ?              5.  DISEASE STATUS: ? ?Home visit made due to concern weight gain.   ? ?Diabetes: Blood sugar checked on this visit with reading 219.   Patient endorses blood sugar running over 200 on average.  Discussed current diet which is high in carbohydrates and processed foods.   Education provided on diabetic diet including healthy choices of fruits, veggies, legumes and lean protein.  Patient's son is doing most of his grocery shopping so foods are limited.   Patient continues with meals on wheels.   ? ?Falls:  Patient continues with falls.  Most recent fall yesterday.  Patient advised he tripped over his oxygen tubing.  Phone call made to [facility] insurance to check on coverage of life alert.  Life alert to be delivered in 1-2 weeks.  ? ?Shortness of breath:  Ongoing with exertion.  Remains on O2 at 2 L via .  Patient endorses a non-productive cough.  Nasal cannula with debris present.  Cannula replaced today and education provided on importance of changing tubing monthly.  ? ?Weight gain:  Reassessed weight.  Today scale is reading 150 lbs.  Patient denies clothes are tighter fitting and breathing is no worse. Re-weighed patient on my scale and reading is 146.4 lbs. Patient scale is off by 3-4 lbs.  ? ? ?HISTORY OF PRESENT ILLNESS:  Shane Friedt. is a 76 y.o. year old male  with multiple medical problems including Idiopathic  pulmonary fibrosis, coronary artery disease s/p stent, COPD, congestive heart failure, dysphasia, hypertension, heart murmur, diabetes, COPD, asthma, chronic chest pain, gerd, anxiety.  Patient is followed by Palliative Care every 4-8 weeks and PRN.  ? ?CODE STATUS: DNR ?ADVANCED DIRECTIVES: No ?MOST FORM: Yes ?PPS: 50% ? ? ?PHYSICAL EXAM:  ? ?VITALS: BP 120/70 HR 73 R 36 O2 sats 95% Weight 146.4 lbs ?  ?LUNGS: tachypnea, crackles present to bilateral bases ?CARDIAC: Cor RRR}  ?EXTREMITIES: - for edema ?SKIN: Skin color, texture, turgor normal. No rashes or lesions or mobility and turgor normal  ?NEURO: positive for dizziness and gait problems ? ? ? ? ? ? ?Truitt Merle, RN ? ?

## 2021-10-12 ENCOUNTER — Telehealth: Payer: Self-pay

## 2021-10-12 NOTE — Telephone Encounter (Signed)
1245 pm.  Incoming call from patient. Advised that Dr. Seward Carol office has been calling to schedule a visit.  Patient was uncertain how to handle this call as he is being followed by [facility] an in-home provider due to transportation issues.  Advised patient that I would contact Dr. Seward Carol office to advise.  Phone call made to Dr. Seward Carol office to advised of above.  ?

## 2021-10-15 ENCOUNTER — Other Ambulatory Visit: Payer: Self-pay | Admitting: Cardiovascular Disease

## 2021-10-17 NOTE — Telephone Encounter (Signed)
Please contact pt for future appointment. Pt due for 12 month f/u. 

## 2021-10-17 NOTE — Telephone Encounter (Signed)
Attempted to schedule no ans no vm  

## 2021-10-18 ENCOUNTER — Other Ambulatory Visit: Payer: Medicare Other

## 2021-10-18 VITALS — BP 112/70 | HR 71 | Temp 97.8°F

## 2021-10-18 DIAGNOSIS — Z515 Encounter for palliative care: Secondary | ICD-10-CM

## 2021-10-18 NOTE — Telephone Encounter (Signed)
Attempted to schedule no ans no vm  

## 2021-10-18 NOTE — Progress Notes (Signed)
PATIENT NAME: Shane Reid. DOB: 1946/01/15 MRN: [id number]  PRIMARY CARE PROVIDER: Mickel Fuchs, MD  RESPONSIBLE PARTY:  Acct ID - Guarantor Home Phone Work Phone Relationship Acct Type  [id number] LAMARK, SCHUE* [phone]  Self P/F     [address] [name], [place] [address]    PLAN OF CARE and INTERVENTIONS:               1.  GOALS OF CARE/ ADVANCE CARE PLANNING:  Avoid hospitalizations and remain home and independent for as long as possible.               2.  PATIENT/CAREGIVER EDUCATION:  Living situation, Depression               4. PERSONAL EMERGENCY PLAN:  Activate 911 for emergencies.               5.  DISEASE STATUS:  Notified by [facility] that patient called into triage last night.  O2 sats dropping into the upper 80's, non-productive cough and pain to the right-side of the chest, and difficulty taking a deep breath and temp of 103 F.  On arrival, patient is flushed in the face, visibly short of breath, O2 in place, difficulty taking a deep breath in due to pain on the rightt side of his chest.  Patient concerned he may have pneumonia.  CXR has been ordered by [facility] and expected to be completed today.  Depression:  Patient advised his wife's 1 year passing is on Thursday.  He reflects on their time together and the challenges of the last year of her life.  Patient then discussed current living situation and desire to move.  He states he son and daughter-in-law wanted him to move to the [facility] in [place].  Patient voiced questions over this complex.  Spoke with Shane Reid, SW at the facility and she advised of two vacant apartments and short waiting list.  Application would need to be completed and if patient is approved additional financial documentation would be required.  Rent and lights are paid by patient and water/TV are paid by complex.  Rent is based on income.  Patient will think more about this and discuss with his family.  1145 am. Incoming call from  [facility] of patient.  She has additional questions about the [facility] and is interested in moving patient despite his current lease not expiring until December.  We discussed location, socialization, safety and complex layout.  I have directed Shane Reid to Shane Reid, SW for additional questions and to obtain an application.   345 pm.  Incoming call from patient to advised CXR was completed and he does have pneumonia.  Patient will be started on antibiotics and this has already been sent to the pharmacy.  Instructed patient to take his first dose today.  Patient will have a follow up visit from [facility] on Friday.    HISTORY OF PRESENT ILLNESS:  Shane Reid. is a 76 y.o. year old male  with multiple medical problems including Idiopathic pulmonary fibrosis, coronary artery disease s/p stent, COPD, congestive heart failure, dysphasia, hypertension, heart murmur, diabetes, COPD, asthma, chronic chest pain, gerd, anxiety.  Patient is followed by Palliative Care every 4-8 weeks and PRN.     CODE STATUS: DNR ADVANCED DIRECTIVES: No MOST FORM: No PPS: 50%   PHYSICAL EXAM:   VITALS: Today's Vitals   10/18/21 1015  BP: 112/70  Pulse: 71  Temp: 97.8 F (  36.6 C)  SpO2: 94%    LUNGS:  Crackles present to left middle and lower lobe, right middle lobe with diminished breath sounds and crackles to the lower lobe. CARDIAC: Cor RRR}  EXTREMITIES: - for edema SKIN: Skin color, texture, turgor normal. No rashes or lesions or mobility and turgor normal  NEURO: positive for dizziness and gait problems       Truitt Merle, RN

## 2021-10-19 NOTE — Telephone Encounter (Signed)
Scheduled

## 2021-10-20 ENCOUNTER — Other Ambulatory Visit: Payer: Medicare Other

## 2021-10-20 DIAGNOSIS — Z515 Encounter for palliative care: Secondary | ICD-10-CM

## 2021-10-20 NOTE — Progress Notes (Signed)
PATIENT NAME: Shane Reid. DOB: [birth year] MRN: [id number]  PRIMARY CARE PROVIDER: Mickel Fuchs, MD  RESPONSIBLE PARTY:  Acct ID - Guarantor Home Phone Work Phone Relationship Acct Type  [id number] BARRON, VANLOAN* [phone]  Self P/F     [address] [name], [place] [address]    Incoming call from patient expressing concern over fatigue.  Patient has not slept well over the last few nights and has recently been diagnosed with pneumonia.  Currently being treated with antibiotics.  Patient provided O2 sat reading of 91-95% on 1 1/2 liters of continuous O2.  Continues with a non-productive cough.  No fever this am.  I requested patient obtain his blood sugar level while on the phone.  Reading is 232 and yesterday was 191.  Patient advised this is normal readings for him.  We discussed current diagnosis of pneumonia, issues with insomnia, depression and today being the 1 year anniversary of his wife's passing.   Active listening and support provided to patient.  He advised he will await delivery of meals on wheels and then take a nap this afternoon.  Patient confirmed visit with [facility] is tomorrow at 1 pm.     Truitt Merle, RN

## 2021-10-23 ENCOUNTER — Other Ambulatory Visit: Payer: Self-pay | Admitting: Cardiovascular Disease

## 2021-10-25 ENCOUNTER — Other Ambulatory Visit: Payer: Medicare Other

## 2021-10-25 DIAGNOSIS — Z515 Encounter for palliative care: Secondary | ICD-10-CM

## 2021-10-25 NOTE — Progress Notes (Signed)
PATIENT NAME: Shane Reid. DOB: 1945-10-17 MRN: [id number]  PRIMARY CARE PROVIDER: Mickel Fuchs, MD  RESPONSIBLE PARTY:  Acct ID - Guarantor Home Phone Work Phone Relationship Acct Type  [id number] ASANTE, BLANDA* [phone]  Self P/F     [address] [name], [place] [address]   ncoming call from patient.  He has not received his life alert.  Advised patient it would be 1-2 weeks before arrival.  If life alert does not arrive this week, we will need to contact the insurance company for follow up.    Patient advises he is no longer having right sided rib cage pain.  Breathing has improved and he only has 2 days of antibiotics.  Patient shared releasing balloons on his wife's 1 year anniversary and that this was therapeutic for him.  Patient is aware of follow up with Christin Gusler, NP next month.  No other concerns voiced at this time.    Truitt Merle, RN

## 2021-10-27 ENCOUNTER — Telehealth: Payer: Self-pay

## 2021-10-27 NOTE — Telephone Encounter (Signed)
430 pm.  Incoming call from patient.  He has not received his life alert and would like assistance following up with insurance.  Joint call made to [facility] and patient gave authorization for information to be provided to this writer.  Confirmation received the life alert system was sent out on 5/16 and should be received any day.  No other concerns voiced by patient.

## 2021-11-01 ENCOUNTER — Telehealth: Payer: Self-pay

## 2021-11-01 NOTE — Telephone Encounter (Signed)
(  1:50 PM) PC SQ outreached patient to attempt to answer questions about medicaid paperwork he received in the mail.  Call unsuccessful. SW LVM with contact information.

## 2021-11-01 NOTE — Telephone Encounter (Signed)
920 am.  Incoming call from patient who advises that previous PCP office continues to call him  regarding an upcoming appointment.  Conference call made with patient to Dr. Candace Cruise office.  Advised receptionist that patient is followed by [facility] as they are able to see patient in his home.  Patient agreed to have  record will be removed so he will no longer receive any calls.

## 2021-11-03 ENCOUNTER — Encounter: Payer: Self-pay | Admitting: Nurse Practitioner

## 2021-11-03 ENCOUNTER — Other Ambulatory Visit: Payer: Medicare Other | Admitting: Nurse Practitioner

## 2021-11-03 DIAGNOSIS — J84112 Idiopathic pulmonary fibrosis: Secondary | ICD-10-CM

## 2021-11-03 DIAGNOSIS — R0602 Shortness of breath: Secondary | ICD-10-CM

## 2021-11-03 DIAGNOSIS — F419 Anxiety disorder, unspecified: Secondary | ICD-10-CM

## 2021-11-03 DIAGNOSIS — Z515 Encounter for palliative care: Secondary | ICD-10-CM

## 2021-11-03 NOTE — Progress Notes (Signed)
[name] Consult Note Telephone: [phone]  Fax: [phone]    Date of encounter: 11/03/21 5:24 PM PATIENT NAME: Shane Reid. [place] [address]   [phone] (home)  DOB: 1945/10/19 MRN: [id number] PRIMARY CARE PROVIDER:    Elba Barman, MD,  [address] [place] [address] [phone] RESPONSIBLE PARTY:    Contact Information     Name Relation Home Work Mobile   [place]   [phone]      I met face to face with patient in home. Palliative Care was asked to follow this patient by consultation request of  Wroth, Honor Loh, MD to address advance care planning and complex medical decision making. This is a follow up visit.                                  ASSESSMENT AND PLAN / RECOMMENDATIONS:  Symptom Management/Plan: 1. Advance Care Planning; DNR; continue to monitor   2. Shortness of breath secondary to recent PNA which has resolved; Idiopathic pulmonary fibrosis; continues to improve, continue O2, increasing when severe dyspnea, continue oxycodone as prescribed; discussed at length about inhalation therapy, continue with continuous O2, he talked about mobility, ambulating; Shane Reid endorses he spends most time in the apartment.    3. Anxiety secondary to secondary to Idiopathic pulmonary fibrosis, COPD; continues to improve. Counseling done, coping strategies, Continue regimencontinues to helping considerable.  We talked about managing anxiety to improving breathing. Emotional support provided.    4. 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 Receiving meals on wheels. We talked about nutrition, monitoring his blood sugars, continue to monitor weights Follow up Palliative Care Visit: Palliative care will continue to follow for complex medical decision making, advance care planning, and clarification of goals. Return 6 weeks or prn.    I spent 63 minutes providing this consultation. More than 50% of the time in this consultation was spent in counseling and care coordination.   PPS: 50%   Chief Complaint: Follow up palliative consult for complex medical decision making   HISTORY OF PRESENT ILLNESS:  Shane Reid. is a 76 y.o. year old male  with multiple medical problems including Idiopathic pulmonary fibrosis, coronary artery disease s/p stent, COPD, congestive heart failure, dysphasia, hypertension, heart murmur, diabetes, COPD, asthma, chronic chest pain, gerd, anxiety. I called Shane Reid to confirm in person PC visit, Shane Reid in agreement. We talked about how Shane Reid has been feeling. Shane Reid endorses he is feeling better, overall doing much better, blood sugars have been running some high which [facility] has been adjusting. We talked about food, appetite, nutrition, weight gain. We talked about ros, symptoms, sleep patterns, appetite, weight, shortness of breath, pain, recent PNA which has resolved, continuous O2. We talked about medical goals reviewed. We talked about staying in the apartment he is currently residing in. We talked about family dynamics. We talked about realistic expectations of himself. We talked about his current living situation, he has the option to move to an senior community which would enable him to be able to get outside, meet other people, go to [employer], out to eat if he chooses. We talked about role pc in poc. Therapeutic listening, emotional support provided. Discussed f/u visit. Questions answered.      History obtained from review of EMR, discussion  with Shane Reid.  I reviewed available labs, medications, imaging, studies and related documents from the EMR.  Records reviewed and summarized above.    ROS 10 point system reviewed all negative except HPI   Physical Exam: Constitutional: NAD General: frail appearing, thin, pleasant male EYES: lids intact ENMT: oral mucous  membranes moist CV: S1S2, RRR Pulmonary: Crackles bases, no increased work of breathing, no cough Abdomen: soft and non tender MSK: ambulatory; +muscle wasting Skin: warm and dry Neuro:  no generalized weakness,  no cognitive impairment Psych: non-anxious affect, A and O x 3 Thank you for the opportunity to participate in the care of Shane Reid.  The palliative care team will continue to follow. Please call our office at [phone] if we can be of additional assistance.   Janaya Broy Z Racine Erby, NP   COVID-19 PATIENT SCREENING TOOL Asked and negative response unless otherwise noted:   Have you had symptoms of covid, tested positive or been in contact with someone with symptoms/positive test in the past 5-10 days?no

## 2021-11-14 ENCOUNTER — Other Ambulatory Visit: Payer: Medicare Other

## 2021-11-14 DIAGNOSIS — Z515 Encounter for palliative care: Secondary | ICD-10-CM

## 2021-11-14 NOTE — Progress Notes (Signed)
PATIENT NAME: Shane Reid. DOB: 12/17/1945 MRN: [id number]  PRIMARY CARE PROVIDER: Mickel Fuchs, MD  RESPONSIBLE PARTY:  Acct ID - Guarantor Home Phone Work Phone Relationship Acct Type  [id number] ELGAR, SCOGGINS* [phone]  Self P/F     [address] [place], [place], [place] [address]    I connected with  Shane Reid. on 11/14/21 by telephone  and verified that I am speaking with the correct person using two identifiers.   I discussed the limitations of evaluation and management by telemedicine. The patient expressed understanding and agreed to proceed.   Incoming call from patient.  Advised that he is having the same right sided lung pain as last month.  Believes pneumonia maybe back. Worsening shortness of breath and low grade temp of 99.4 F during the night.  He has contacted his PCP and awaiting a response back. Oxygen saturation 89% on 1 L via [place].  Patient states he nose is runny and   4 pm.  Spoke with Corrie Dandy at [facility] and portable CXR has been ordered, antibiotic ordered and patient will be seen on Thursday by provider.  Patient aware.     Truitt Merle, RN

## 2021-11-17 ENCOUNTER — Other Ambulatory Visit: Payer: Medicare Other

## 2021-11-17 DIAGNOSIS — Z515 Encounter for palliative care: Secondary | ICD-10-CM

## 2021-11-17 NOTE — Progress Notes (Signed)
PATIENT NAME: Shane Reid. DOB: Mar 12, 1946 MRN: [id number]  PRIMARY CARE PROVIDER: Mickel Fuchs, MD  RESPONSIBLE PARTY:  Acct ID - Guarantor Home Phone Work Phone Relationship Acct Type  [id number] [name], [name]* [phone]  Self P/F     [address] [name], [place] [address]    232 pm.  Incoming call from patient.  He advises he does have pneumonia and was started on antibiotics. He took a cab to the pharmacy yesterday to pick up medications as no one was able to pick them up.   Patient was seen yesterday by provider. Patient is calling today because he has not received his life alert.  We made a call on 10/10/21 to [facility] and were advised this would only take 1-2 weeks through [employer].  Joint conference call made with patient to [facility].  Representative connected [place] to [employer] who is the vender for life alert devices.  Order has been sitting in progress and it has been sent to the supervisor to escalate the process.  Delivery date should be 5-7 days business days.  Customer service number for [employer] is [phone].  Patient aware to contact this writer if he does not receive the device in 5-7 business days.   Time 230-300 pm.  Truitt Merle, RN

## 2021-11-23 ENCOUNTER — Other Ambulatory Visit: Payer: Medicare Other

## 2021-11-23 DIAGNOSIS — Z515 Encounter for palliative care: Secondary | ICD-10-CM

## 2021-11-23 NOTE — Progress Notes (Signed)
PATIENT NAME: Shane Reid. DOB: Dec 25, 1945 MRN: [id number]  PRIMARY CARE PROVIDER: Mickel Fuchs, MD  RESPONSIBLE PARTY:  Acct ID - Guarantor Home Phone Work Phone Relationship Acct Type  [id number] TAD, FANCHER* [phone]  Self P/F     [address] [name], [place] [address]    Message received from patient that his son is dealing with a family emergency.  Food supply is limited and patient has no way of obtaining more.  Food pantry for [employer] accessed and non-perishable items taken to patient.  Patient advised his antibiotic has been extended but he has been unable to obtain medication.  We discussed use of a pharmacy with delivery service but patient declines.  5 pm.  Incoming call from [place] with [facility].  She will be seeing the patient either tomorrow or Friday.  Advised of patient's situation and not obtaining antibiotics.  She will follow up with patient regarding pharmacy that provides delivery services.    Truitt Merle, RN

## 2021-11-25 ENCOUNTER — Telehealth: Payer: Self-pay

## 2021-11-25 ENCOUNTER — Other Ambulatory Visit: Payer: Medicare Other

## 2021-11-25 DIAGNOSIS — Z515 Encounter for palliative care: Secondary | ICD-10-CM

## 2021-11-25 NOTE — Progress Notes (Signed)
PATIENT NAME: Shane Reid. DOB: 1946/03/17 MRN: [id number]  PRIMARY CARE PROVIDER: Elba Barman, MD  RESPONSIBLE PARTY:  Acct ID - Guarantor Home Phone Work Phone Relationship Acct Type  [id number] CARMIN, [id number]  Self P/F     [address] [place], [place] [address]    Incoming call from patient to advise he fell at [place] this morning and EMS was called.  He declined an ED visit and has returned home.   Patient went to [employer] to access the bank when he fell backwards due to dizziness/light-headedness.  Patient advised he did not hit his head or loose consciousness.  He is sore around his waist where he hit a bag holder but otherwise feels fine.  Patient obtained his blood sugar while on the phone and reading is 165.  Today's weight is 146 lbs.  Patient is aware that [facility] nurse, Larena Glassman is on the way to complete a scheduled visit for today.  Patient advised he still has not been able to pick up his antibiotics.  We discussed [facility] who is able to deliver medications to patient. He will consider changing once he meets with the [facility] nurse today.  Life alert has still not arrived.  We will contact insurance company the first of next week if it has not arrived.    Spoke with daughter-in-law Larene Beach who provided an update on patient fall as well.  Family is concerned over patient safety.  They are willing to transport patient to the grocery store, bank etc when needed but patient does not always notify them.  Larene Beach is concerned patient maybe sending money to women he has met through social media.  They are continuing to talk to patient about this. Larene Beach advised of family emergency this week and this has limited their ability to assist with patient.   Update received from [place], [place] with [employer].  Visit completed and no abnormal findings. Blood work obtained today.  Orthostatic bp checked and no issues.  NP will follow up with patient over the  weekend.    Lorenza Burton, RN

## 2021-11-25 NOTE — Telephone Encounter (Signed)
(  10 AM): PC SW made TC to patient x2.  Call unsuccessful. SW unable to LVM.

## 2021-11-30 ENCOUNTER — Other Ambulatory Visit: Payer: Medicare Other

## 2021-11-30 DIAGNOSIS — Z515 Encounter for palliative care: Secondary | ICD-10-CM

## 2021-11-30 NOTE — Progress Notes (Signed)
PATIENT NAME: Shane Reid. DOB: [birth year] MRN: [id number]  PRIMARY CARE PROVIDER: Mickel Fuchs, MD  RESPONSIBLE PARTY:  Acct ID - Guarantor Home Phone Work Phone Relationship Acct Type  [id number] ALBIE, ARIZPE* [phone]  Self P/F     [address] [name], [place] [address]   Incoming call from patient to advise he has not received his life alert.  Joint call made to [employer] to follow up on status.  Per Freida Busman, device is expected to be delivered today.  Patient verbalized feeling more fatigued since his fall last week.  Continues with productive cough with white phlegm.  Patient confirmed restarting antibiotics for pneumonia. No longer having pain to the right lower lung.  Doing breathing treatments routinely.  Patient still having lower back/coccyx pain from his fall.   [facility] has changed him over to [facility] as they are able to deliver medications.     Truitt Merle, RN

## 2021-12-02 ENCOUNTER — Other Ambulatory Visit: Payer: Medicare Other

## 2021-12-02 VITALS — BP 100/60 | HR 75 | Temp 97.8°F | Resp 22

## 2021-12-02 DIAGNOSIS — Z515 Encounter for palliative care: Secondary | ICD-10-CM

## 2021-12-02 NOTE — Progress Notes (Signed)
PATIENT NAME: [name] DOB: 1945-10-06 MRN: [id number]  PRIMARY CARE PROVIDER: Mickel Fuchs, MD  RESPONSIBLE PARTY:  Acct ID - Guarantor Home Phone Work Phone Relationship Acct Type  [id number] CEASAR, DECANDIA* [phone]  Self P/F     [address] [place], [place] [address]    Home visit completed to set up life alert.  Life alert set up completed and activated.  Directions provided to patient and charge established.  No further questions voiced by patient.    PHYSICAL EXAM:   VITALS: Today's Vitals   12/02/21 0927  BP: 100/60  Pulse: 75  Resp: (!) 22  Temp: 97.8 F (36.6 C)  SpO2: 95%  PainSc: 4   PainLoc: Back         Truitt Merle, RN

## 2021-12-15 ENCOUNTER — Other Ambulatory Visit: Payer: Medicare Other

## 2021-12-15 DIAGNOSIS — Z515 Encounter for palliative care: Secondary | ICD-10-CM

## 2021-12-15 NOTE — Progress Notes (Signed)
PATIENT NAME: [name] DOB: [birth year] MRN: [id number]  PRIMARY CARE PROVIDER: Mickel Fuchs, MD  RESPONSIBLE PARTY:  Acct ID - Guarantor Home Phone Work Phone Relationship Acct Type  [id number] VINT, POLA* [phone]  Self P/F     [address] [place], [place], [place] [address]    I connected with  [name] on 12/15/21 by telephone and verified that I am speaking with the correct person using two identifiers.   I discussed the limitations of evaluation and management by telemedicine. The patient expressed understanding and agreed to proceed.   Return call made to patient who is voicing his concerns over delivery charge with [facility]. Current bill is $13 or $14 but patient is uncertain why.  Patient may want to change [facility] [place].  I contacted [facility] and spoke with Joni Reining.  She advised patient requested docusate which is not covered by his insurance.  Cost is $8.49 with a $3 delivery charge.   Should [facility] want to [facility] [place] he will need to contact them with the request.  [facility] would then transfer scripts to them with the exception of alprazolam and oxycodone.  Those would require new [facility].  Spoke patient and explained above.  He verbalized understanding and will notify either myself or [facility] if he decides to change pharmacies.   CODE STATUS: DNR ADVANCED DIRECTIVES: No MOST FORM: No PPS: 50%          Truitt Merle, RN

## 2021-12-19 ENCOUNTER — Encounter: Payer: Self-pay | Admitting: *Deleted

## 2021-12-19 NOTE — Progress Notes (Unsigned)
Patient ID: Shane Hales., male   DOB: 1945-11-13, 76 y.o.   MRN: [id number] Cardiology Office Note  Date:  12/20/2021   ID:  Shane Lipton., DOB 1946/04/05, MRN [id number]  PCP:  Elba Barman, MD   Chief Complaint  Patient presents with   12 month follow up     Patient c/o shortness of breath. Medications reviewed by the patient verbally.     HPI:  Shane Reid is a 76 year old gentleman with a history of  coronary artery disease, PCI with LAD PCI in 07-26-2006,  chronic chest pain which is musculoskeletal,  traumatic injury to the chest,  esophageal strictures, status post esophageal stretching,  h/o left neck pain radiating to the arm felt to be musculoskeletal,  Chronic shortness of breath With CT scan documenting pulmonary fibrosis  who follows up for his coronary artery disease  Last office visit May 2022 Notes indicating he is followed by palliative care CODE STATUS DNR Has a life alert  Has periodic productive cough with white phlegm Recent treatment with antibiotics for pneumonia Does breathing treatments on a regular basis  [place] [facility] November 25, 2021, "oxygen got too low", EMS called, declined trip to the ER  In the ER 9/22: right side chest pain  On chronic oxygen 1-3 liters  Seen in the emergency room 07/26/2020 shortness of breath, pulmonary fibrosis  EKG personally reviewed by myself on todays visit Nsr rate 80 bpm ST/T wave Abn in I and AVL  Other recent stressors, Ex-wife  passed away in 07/26/20, together for 50 years  Followed by pulmonary at [facility] in the past, now followed by [facility] CT scan documenting Pulmonary fibrosis (11/23/15)  Today wearing oxygen 2 to 3 liters Using flutter vest 3x a day  Other past medical history Previous lung biopsy by Dr. Faith Rogue  Prior trauma to his mediastinum, Had severe rib pain, upper left mediastinal area,   previous stress test for chest pain 11/27/2013. There was no ischemia,   cardiac  catheterization in 07-27-2011 showing patent stent, no severe stenoses that would contribute to his chest pain. Right heart catheterization was done at the same time that showed normal right heart pressures    PMH:   has a past medical history of Anxiety, Asthma, Bradycardia, CHF (congestive heart failure) ([place]), Chronic Chest Pain, COPD (chronic obstructive pulmonary disease) ([place]), Coronary artery disease, Depression, Diabetes mellitus without complication ([place]), GERD (gastroesophageal reflux disease), Heart murmur, Hypercholesteremia, Hypertension, Hypertensive heart disease, Idiopathic pulmonary fibrosis ([place]), and Other social stressor.  PSH:    Past Surgical History:  Procedure Laterality Date   CARDIAC CATHETERIZATION     CORONARY ANGIOPLASTY     stents times 4   ESOPHAGOGASTRODUODENOSCOPY (EGD) WITH PROPOFOL N/A 10/22/2019   Procedure: ESOPHAGOGASTRODUODENOSCOPY (EGD) WITH PROPOFOL;  Surgeon: Virgel Manifold, MD;  Location: [facility] ENDOSCOPY;  Service: Endoscopy;  Laterality: N/A;   Esophagus stretch     EYE SURGERY     Bilateral cataracts   LUNG BIOPSY     NASAL ENDOSCOPY N/A 12/21/2016   Procedure: NASAL ENDOSCOPY;  Surgeon: Margaretha Sheffield, MD;  Location: [facility];  Service: ENT;  Laterality: N/A;   TESTICLE SURGERY     VIDEO ASSISTED THORACOSCOPY (VATS)/THOROCOTOMY Right 11/18/2014   Procedure: VIDEO ASSISTED THORACOSCOPY (VATS)/ wedge resection biopsy;  Surgeon: Nestor Lewandowsky, MD;  Location: [facility] ORS;  Service: General;  Laterality: Right;    Current Outpatient Medications  Medication Sig Dispense Refill   Accu-Chek Softclix Lancets  lancets USE AS DIRECTED EVERY DAY     albuterol (PROVENTIL) (2.5 MG/3ML) 0.083% nebulizer solution Take 3 mLs (2.5 mg total) by nebulization every 6 (six) hours as needed. 75 mL 11   albuterol (VENTOLIN HFA) 108 (90 Base) MCG/ACT inhaler Inhale 2 puffs into the lungs every 4 (four) hours as needed for wheezing.     ALPRAZolam (XANAX) 0.5 MG  tablet Take 0.5 mg by mouth every 4 (four) hours as needed.     amLODipine (NORVASC) 5 MG tablet Take 5 mg by mouth daily.     aspirin EC 81 MG tablet Take 81 mg by mouth every morning.     benzonatate (TESSALON) 100 MG capsule Take 1 capsule (100 mg total) by mouth 3 (three) times daily as needed for cough. 20 capsule 0   Blood Glucose Monitoring Suppl (ACCU-CHEK GUIDE ME) w/Device KIT DX E11.9 (MEDICAID PREFERRED)     diclofenac Sodium (VOLTAREN) 1 % GEL Apply 1 application topically 4 (four) times daily.      docusate sodium (COLACE) 100 MG capsule Take 100 mg by mouth daily.     guaiFENesin-codeine 100-10 MG/5ML syrup Take 5 mLs by mouth every 4 (four) hours as needed.     HYDROcodone-acetaminophen (NORCO/VICODIN) 5-325 MG tablet Take 1 tablet by mouth every 4 (four) hours as needed for moderate pain. 12 tablet 0   ipratropium-albuterol (DUONEB) 0.5-2.5 (3) MG/3ML SOLN Take 3 mLs by nebulization 3 (three) times daily.     JARDIANCE 25 MG TABS tablet Take 25 mg by mouth daily.     lactulose (CHRONULAC) 10 GM/15ML solution Take 30 mLs (20 g total) by mouth daily as needed for mild constipation. 120 mL 0   LORazepam (ATIVAN) 0.5 MG tablet Take 0.5 mg by mouth every 4 (four) hours as needed.     nitroGLYCERIN (NITROSTAT) 0.4 MG SL tablet Place 1 tablet (0.4 mg total) under the tongue every 5 (five) minutes as needed. 25 tablet 0   oxyCODONE (ROXICODONE INTENSOL) 20 MG/ML concentrated solution SMARTSIG:0.5 Milliliter(s) By Mouth Every 4 Hours PRN     pantoprazole (PROTONIX) 40 MG tablet Take 1 tablet (40 mg total) by mouth daily. 30 tablet 0   predniSONE (DELTASONE) 20 MG tablet Take 1 tablet (20 mg total) by mouth daily with breakfast. 5 tablet 0   predniSONE (STERAPRED UNI-PAK 21 TAB) 10 MG (21) TBPK tablet Per packaging instructions 21 tablet 0   pregabalin (LYRICA) 50 MG capsule Take 100 mg by mouth 2 (two) times daily.      QUEtiapine (SEROQUEL) 25 MG tablet Take 25 mg by mouth at bedtime.      rosuvastatin (CRESTOR) 10 MG tablet Take 1 tablet (10 mg) by mouth once daily. You must keep your follow up appointment with Dr. Rockey Situ for further refills. 90 tablet 0   cefdinir (OMNICEF) 300 MG capsule Take 1 capsule (300 mg total) by mouth 2 (two) times daily. (Patient not taking: Reported on 12/20/2021) 20 capsule 0   metFORMIN (GLUCOPHAGE) 1000 MG tablet Take 1,000 mg by mouth 2 (two) times daily with a meal. (Patient not taking: Reported on 12/20/2021)     Multiple Vitamins-Minerals (CENTRUM SILVER PO) Take 1 tablet by mouth daily.  (Patient not taking: Reported on 08/11/2021)     No current facility-administered medications for this visit.     Allergies:   Nintedanib, Metoprolol, Metoprolol succinate, Niacin, Sertraline hcl, and Sertraline hcl   Social History:  The patient  reports that he quit smoking about 54  years ago. His smoking use included cigarettes. He has a 0.50 pack-year smoking history. He has never used smokeless tobacco. He reports that he does not drink alcohol and does not use drugs.   Family History:   family history includes Coronary artery disease in his mother; Depression in an other family member; Heart disease in an other family member; Heart failure in his mother.    Review of Systems: Review of Systems  Constitutional: Negative.   HENT: Negative.    Respiratory:  Positive for shortness of breath.   Cardiovascular: Negative.   Gastrointestinal: Negative.   Musculoskeletal:  Positive for joint pain.  Neurological: Negative.   Psychiatric/Behavioral: Negative.    All other systems reviewed and are negative.   PHYSICAL EXAM: BP 130/60 (BP Location: Left Arm, Patient Position: Sitting, Cuff Size: Normal)   Pulse 80   Ht 5' 9"  (1.753 m)   Wt 146 lb 6 oz (66.4 kg)   SpO2 93% Comment: 3 Liters  BMI 21.62 kg/m  Constitutional:  oriented to person, place, and time. No distress.  HENT:  Head: Grossly normal Eyes:  no discharge. No scleral icterus.  Neck:  No JVD, no carotid bruits  Cardiovascular: Regular rate and rhythm, no murmurs appreciated Pulmonary/Chest: Clear to auscultation bilaterally, no wheezes or rails Abdominal: Soft.  no distension.  no tenderness.  Musculoskeletal: Normal range of motion Neurological:  normal muscle tone. Coordination normal. No atrophy Skin: Skin warm and dry Psychiatric: normal affect, pleasant  Recent Labs: 02/17/2021: BUN 15; Creatinine, Ser 0.87; Hemoglobin 15.2; Platelets 215; Potassium 3.7; Sodium 139    Lipid Panel Lab Results  Component Value Date   CHOL 94 (L) 10/02/2017   HDL 34 (L) 10/02/2017   LDLCALC 44 10/02/2017   TRIG 81 10/02/2017      Wt Readings from Last 3 Encounters:  12/20/21 146 lb 6 oz (66.4 kg)  10/10/21 146 lb 6.4 oz (66.4 kg)  08/11/21 146 lb (66.2 kg)    ASSESSMENT AND PLAN:  Chest pain, unspecified chest pain type -  long history atypical chest pain Currently with no symptoms of angina. No further workup at this time. Continue current medication regimen.  Syncope, unspecified syncope type -  Stable, no recent episodes Chronic oxygen, BP stable  Hypertensive heart disease without heart failure Blood pressure is well controlled on today's visit. No changes made to the medications.  Shortness of breath/pulmonary fibrosis Long history of pulmonary fibrosis followed by pulmonary at [facility] On oxygen 2-3 liters On palliative  ILD (interstitial lung disease) ([place]) Seen by pulmonary at [facility] , on chronic oxygen  Cad with stable angina Currently with no symptoms of angina. No further workup at this time. Continue current medication regimen.   Total encounter time more than 30 minutes  Greater than 50% was spent in counseling and coordination of care with the patient   Orders Placed This Encounter  Procedures   EKG 12-Lead    Signed, Esmond Plants, M.D., Ph.D. 12/20/2021  [place], [place]

## 2021-12-20 ENCOUNTER — Ambulatory Visit (INDEPENDENT_AMBULATORY_CARE_PROVIDER_SITE_OTHER): Payer: Medicare Other | Admitting: Cardiovascular Disease

## 2021-12-20 ENCOUNTER — Encounter: Payer: Self-pay | Admitting: Cardiovascular Disease

## 2021-12-20 VITALS — BP 130/60 | HR 80 | Ht 69.0 in | Wt 146.4 lb

## 2021-12-20 DIAGNOSIS — R0789 Other chest pain: Secondary | ICD-10-CM | POA: Diagnosis not present

## 2021-12-20 DIAGNOSIS — J849 Interstitial pulmonary disease, unspecified: Secondary | ICD-10-CM | POA: Diagnosis not present

## 2021-12-20 DIAGNOSIS — J9621 Acute and chronic respiratory failure with hypoxia: Secondary | ICD-10-CM | POA: Diagnosis not present

## 2021-12-20 DIAGNOSIS — E782 Mixed hyperlipidemia: Secondary | ICD-10-CM

## 2021-12-20 DIAGNOSIS — I25118 Atherosclerotic heart disease of native coronary artery with other forms of angina pectoris: Secondary | ICD-10-CM

## 2021-12-20 DIAGNOSIS — J441 Chronic obstructive pulmonary disease with (acute) exacerbation: Secondary | ICD-10-CM | POA: Diagnosis not present

## 2021-12-20 MED ORDER — AMLODIPINE BESYLATE 5 MG PO TABS: 5.0000 mg | ORAL_TABLET | Freq: Every day | ORAL | 3 refills | Status: DC

## 2021-12-20 MED ORDER — ROSUVASTATIN CALCIUM 10 MG PO TABS: 10.0000 mg | ORAL_TABLET | Freq: Every day | ORAL | 3 refills | Status: DC

## 2021-12-20 NOTE — Patient Instructions (Signed)
Medication Instructions:  No changes  If you need a refill on your cardiac medications before your next appointment, please call your pharmacy.   Lab work: No new labs needed  Testing/Procedures: No new testing needed  Follow-Up: At [facility], you and your health needs are our priority.  As part of our continuing mission to provide you with exceptional heart care, we have created designated Provider Care Teams.  These Care Teams include your primary Cardiologist (physician) and Advanced Practice Providers (APPs -  Physician Assistants and Nurse Practitioners) who all work together to provide you with the care you need, when you need it.  You will need a follow up appointment in 12 months  Providers on your designated Care Team:   Christopher Berge, NP Ryan Dunn, PA-C Cadence Furth, PA-C  COVID-19 Vaccine Information can be found at: [email or web address] For questions related to vaccine distribution or appointments, please email [email or web address] or call [phone].   

## 2021-12-22 ENCOUNTER — Other Ambulatory Visit: Payer: Medicare Other

## 2021-12-22 DIAGNOSIS — Z515 Encounter for palliative care: Secondary | ICD-10-CM

## 2021-12-22 NOTE — Progress Notes (Signed)
PATIENT NAME: Shane Reid. DOB: 1945/12/27 MRN: [id number]  PRIMARY CARE PROVIDER: Mickel Fuchs, MD  RESPONSIBLE PARTY:  Acct ID - Guarantor Home Phone Work Phone Relationship Acct Type  [id number] [name], [name]* [phone]  Self P/F     [address] [name], [place] [address]    Incoming call from patient who advises he was started on antibiotics for pneumonia again.  Patient notes low grade fever, increase in cough and right-side lung pain with inspiration.  Patient states he has been treated 3 x in the last 3 months for pneumonia.  Patient advises no other changes.  Weight remains at 146 lbs.  He continues to eat well.   Patient states he has remained home over the last 2 weeks.  He does keep his life alert with him but has not activated it since my last visit.  Update provided to Pearletha Furl, NP.      Truitt Merle, RN

## 2022-01-04 ENCOUNTER — Telehealth: Payer: Self-pay

## 2022-01-04 ENCOUNTER — Other Ambulatory Visit: Payer: Medicare Other | Admitting: Nurse Practitioner

## 2022-01-04 ENCOUNTER — Encounter: Payer: Self-pay | Admitting: Nurse Practitioner

## 2022-01-04 DIAGNOSIS — J84112 Idiopathic pulmonary fibrosis: Secondary | ICD-10-CM

## 2022-01-04 DIAGNOSIS — R0602 Shortness of breath: Secondary | ICD-10-CM

## 2022-01-04 DIAGNOSIS — F419 Anxiety disorder, unspecified: Secondary | ICD-10-CM

## 2022-01-04 DIAGNOSIS — Z515 Encounter for palliative care: Secondary | ICD-10-CM

## 2022-01-04 NOTE — Progress Notes (Signed)
[facility] Palliative Care Consult Note Telephone: [phone]  Fax: [phone]    Date of encounter: 01/04/22 7:09 PM PATIENT NAME: Shane Reid. [place] [address]   [phone] (home)  DOB: July 21, 1945 MRN: [id number] PRIMARY CARE PROVIDER:    Elba Barman, MD,  [place] [address] [phone]  REFERRING PROVIDER:   Elba Barman, MD [address],  [place] [address] [phone]  RESPONSIBLE PARTY:    Contact Information     Name Relation Home Work Mobile   [place]   [phone]      I met face to face with patient in home. Palliative Care was asked to follow this patient by consultation request of  Wroth, Honor Loh, MD to address advance care planning and complex medical decision making. This is a follow up visit.                                  ASSESSMENT AND PLAN / RECOMMENDATIONS:  Symptom Management/Plan: 1. Advance Care Planning; DNR; continue to monitor   2. Shortness of breath secondary to recent PNA which has resolved; Idiopathic pulmonary fibrosis; continues to improve, continue O2, increasing when severe dyspnea, continue oxycodone as prescribed; discussed at length about inhalation therapy, continue with continuous O2, he talked about mobility, ambulating; Shane. Fearn endorses he spends most time in the apartment.    3. Anxiety secondary to secondary to Idiopathic pulmonary fibrosis, COPD; continues to improve. Counseling done, coping strategies, Continue regimencontinues to helping considerable.  We talked about managing anxiety to improving breathing. Emotional support provided.    4. 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 Receiving meals on wheels. We talked about nutrition, monitoring his blood sugars, continue to monitor weights Follow up Palliative Care Visit: Palliative care will continue to follow  for complex medical decision making, advance care planning, and clarification of goals. Return 6 weeks or prn.   I spent 43 minutes providing this consultation. More than 50% of the time in this consultation was spent in counseling and care coordination.   PPS: 50%   Chief Complaint: Follow up palliative consult for complex medical decision making   HISTORY OF PRESENT ILLNESS:  Shane Reid. is a 76 y.o. year old male  with multiple medical problems including Idiopathic pulmonary fibrosis, coronary artery disease s/p stent, COPD, congestive heart failure, dysphasia, hypertension, heart murmur, diabetes, COPD, asthma, chronic chest pain, gerd, anxiety. I called Shane. Bruhl to confirm in person PC visit, Shane. Swanger in agreement. I visited and observed Shane Reid in his home with Kateri Plummer NP student. We talked about how Shane Reid has been feeling. We talked about ROS, including pain, shortness of breath. We talked about vest which he has returned. We talked about multiple recurrent pna, currently feeling better. We talked about appetite, weights. We talked about nutrition. We talked about visits with [facility]. We talked about medical goals. Shane. Reid talked about not ready for hospice at this time. We talked about quality of life. Shane. Reid talked at length about his puzzles he is working on, things that bring him joy, coping strategies. We talked about role pc in poc. Therapeutic listening, emotional support provided. Discussed f/u visit. Questions answered.      History obtained from review of EMR, discussion with Shane. Turnage.  I reviewed available labs, medications, imaging, studies and related documents from the EMR.  Records reviewed and summarized above.    ROS 10 point system reviewed all negative except HPI   Physical Exam: Constitutional: NAD General: frail appearing, thin, pleasant male EYES: lids intact ENMT: oral mucous membranes moist CV: S1S2, RRR Pulmonary:  Crackles bases, no increased work of breathing, no cough Abdomen: soft and non tender MSK: ambulatory; +muscle wasting Skin: warm and dry Neuro:  no generalized weakness,  no cognitive impairment Psych: non-anxious affect, A and O x 3 Thank you for the opportunity to participate in the care of Shane. Reid.  The palliative care team will continue to follow. Please call our office at [phone] if we can be of additional assistance.   Johnnisha Forton Ihor Gully, NP

## 2022-01-04 NOTE — Telephone Encounter (Signed)
837 am.  Patient inquiring about retractable oxygen tubing as recommended by pcp.  Phone call made to [facility] Specialist who provide oxygen to patient.  Spoke with [facility] who advised they do not carry retractable oxygen tubing only standard 25/50 ft tubing.  Tubing may possibly be purchased at a local medical store.  Patient updated on above.

## 2022-03-07 ENCOUNTER — Other Ambulatory Visit: Payer: Medicare Other | Admitting: Nurse Practitioner

## 2022-03-08 ENCOUNTER — Telehealth: Payer: Self-pay

## 2022-03-08 NOTE — Telephone Encounter (Signed)
Late Entry for 03/07/22.  Patient telephoned in to advise he has not been feeling well over the last week.  [facility] saw patient last week and no changes are reported.  Patient endorses fatigue and increase in sleep.  No fever.  Coughing up white foam at times but no other symptoms.  Update provided to Natalia Leatherwood, NP.  NP to see patient on Thursday.

## 2022-03-09 ENCOUNTER — Other Ambulatory Visit: Payer: Medicare Other | Admitting: Nurse Practitioner

## 2022-03-09 ENCOUNTER — Encounter: Payer: Self-pay | Admitting: Nurse Practitioner

## 2022-03-09 ENCOUNTER — Telehealth: Payer: Self-pay

## 2022-03-09 DIAGNOSIS — Z515 Encounter for palliative care: Secondary | ICD-10-CM

## 2022-03-09 DIAGNOSIS — R0602 Shortness of breath: Secondary | ICD-10-CM

## 2022-03-09 DIAGNOSIS — J84112 Idiopathic pulmonary fibrosis: Secondary | ICD-10-CM

## 2022-03-09 DIAGNOSIS — J189 Pneumonia, unspecified organism: Secondary | ICD-10-CM

## 2022-03-09 NOTE — Telephone Encounter (Signed)
135 pm.  Connected with Larena Glassman, RN with [facility].  Advised of NP visit today and start of antibiotics for pneumonia.  Discussed decline in respiratory status and treatment of pneumonia 4 x since May.  Advised case is being reviewed for hospice eligibility and will likely need a hospice referral.    415 pm.  Per Dr. Lyman Speller patient has displayed a decline since hospice discharge.  Hospice referral has been requested from [facility].   Christin Gusler, NP updated.

## 2022-03-09 NOTE — Progress Notes (Signed)
[facility] Consult Note Telephone: [phone]  Fax: [phone]    Date of encounter: 03/09/22 1:53 PM PATIENT NAME: Shane Reid. [place] [address]   [phone] (home)  DOB: 30-Sep-1945 MRN: [id number] PRIMARY CARE PROVIDER:    Elba Barman, MD,  [address] [place] [address] [phone]  RESPONSIBLE PARTY:    Contact Information     Name Relation Home Work Mobile   [place]   [phone]       I met face to face with patient in home. Palliative Care was asked to follow this patient by consultation request of  Wroth, Honor Loh, MD to address advance care planning and complex medical decision making. This is a follow up visit.                                  ASSESSMENT AND PLAN / RECOMMENDATIONS:  Symptom Management/Plan: 1. Advance Care Planning; DNR; continue to monitor Discussed at length with Shane Reid about hospice services under Medicare. Shane. Reid in agreement to proceed with hospice.   Why hospice as previously under hospice services but d/c due to stability.   Recurrent PNA's with current exacerbations every 4 to 6 weeks. Severe shortness of breath with desaturations 70's with any exertion or more than 5 steps in the setting of interstitial disease. Weight reported 150 lbs which was still higher than when on hospice though initial weight loss on hospice was due to the loss of his wife, grieving.    2. Shortness of breath secondary PNA which has resolved; Idiopathic pulmonary fibrosis; continues to improve, continue O2, increasing when severe dyspnea, continue oxycodone as prescribed; discussed at length about inhalation therapy, continue with continuous O2, monitor respiratory status  3. PNA with clinical presentation, discussed with Shane. Reid will send in abx:  Rx: Augmentin 554m bid x 14 day; #28; No RF Rx: Doxycycline 1061mbid x 14 days; #28; No RF    4. Anxiety secondary to secondary to Idiopathic pulmonary fibrosis, COPD; continues to improve. Counseling done, coping strategies, Continue regimencontinues to helping considerable.  We talked about managing anxiety to improving breathing. Emotional support provided.    5. 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 Receiving meals on wheels. We talked about nutrition, monitoring his blood sugars, continue to monitor weights   I spent 61 minutes providing this consultation. More than 50% of the time in this consultation was spent in counseling and care coordination.   PPS: 50%   Chief Complaint: Follow up palliative consult for complex medical decision making   HISTORY OF PRESENT ILLNESS:  RaEphrem Carrickis a [age].o. year old male  with multiple medical problems including Idiopathic pulmonary fibrosis, coronary artery disease s/p stent, COPD, congestive heart failure, dysphasia, hypertension, heart murmur, diabetes, COPD, asthma, chronic chest pain, gerd, anxiety. I called Shane. BaLehigho confirm in person PC visit, Shane. BaLorimern agreement. I visited and observed Shane Reid his home. We talked about how he has been feeling. Shane. Reid "I think I have pneumonia again". "I feel bad, short of breath, weak, no appetite". "I have not been able to eat since became sick". We talked about ros. We talked about overall chronic disease progression, previously under hospice services. We talked about concern for recurrent pna's, and with  clinical presentation, current with pna. Discussed will send augmentin and doxycycline in to pharmacy. We talked about medical goals, he declines hospitalization. We talked about having hospice return for av visit, Shane Reid in agreement, notified [facility] for order. Therapeutic listening, emotional support provided. Discussed f/u visit. Questions answered.      History obtained from review of EMR, discussion with Shane.  Reid.  I reviewed available labs, medications, imaging, studies and related documents from the EMR.  Records reviewed and summarized above.    ROS 10 point system reviewed all negative except HPI   Physical Exam: Constitutional: NAD General: frail appearing, thin, pleasant male EYES: lids intact ENMT: oral mucous membranes moist CV: S1S2, RRR Pulmonary: +productive cough, Crackles right base to mid; fine crackles left base, remains O2, sob with exertion, excessive speaking Abdomen: soft and non tender MSK: ambulatory; +muscle wasting Skin: warm and dry Neuro:  no generalized weakness,  no cognitive impairment Psych: non-anxious affect, A and O x 3 Thank you for the opportunity to participate in the care of Shane. Reid.  The palliative care team will continue to follow. Please call our office at [phone] if we can be of additional assistance.   Verita Kuroda Ihor Gully, NP

## 2022-03-14 ENCOUNTER — Telehealth: Payer: Self-pay

## 2022-03-14 NOTE — Telephone Encounter (Signed)
1130 am.  Follow up call made to patient.  Hospice assessment completed on Friday and patient did not qualify for services.  He will continue with Palliative Care.  Home visit scheduled for Monday, October 30 @ 9 am.

## 2022-03-22 ENCOUNTER — Telehealth: Payer: Self-pay

## 2022-03-22 NOTE — Telephone Encounter (Signed)
1130 am.  Return call made to patient.  He advises his breathing is much better.  He will complete antibiotics on Thursday.  Patient engaging in conversation about his puzzles he is completing.  Meals on wheels has dropped food already and brought a new puzzle.  No new concerns voiced by patient.   Visit scheduled for next week.

## 2022-03-27 ENCOUNTER — Other Ambulatory Visit: Payer: Medicare Other

## 2022-03-27 VITALS — BP 122/64 | HR 71 | Temp 97.7°F | Wt 147.0 lb

## 2022-03-27 DIAGNOSIS — Z515 Encounter for palliative care: Secondary | ICD-10-CM

## 2022-03-27 NOTE — Progress Notes (Signed)
PATIENT NAME: Shane Reid. DOB: [birth year] MRN: [id number]  PRIMARY CARE PROVIDER: Elba Barman, MD  RESPONSIBLE PARTY:  Acct ID - Guarantor Home Phone Work Phone Relationship Acct Type  [id number] ASTIN, [phone]  Self P/F     [place], [place] [address]   Edema:  Left leg with 1+ pitting edema present left calf to ankle.  Right with trace edema.  Indentations to ankles from socks.  Encouraged elevation of lower extremities throughout the day.  Education provided on CHF.  Patient continues to weight himself daily.  Encouraged to call with weight gain/increase edema to lower extremities.  Fatigue: Patient endorses napping most days in the afternoon.  Ranges from 1-2 hours.  Sleeping well during the night.  Outings:  Patient endorses taking a taxi 1-2 x a month to [employer] just to get out.  Son [name] continues to pick up groceries, medications and pays bills for patient.  Respiratory:  Patient advises he is wearing oxygen as needed.  At rest O2 saturation is 90% with pulse 71.  Patient ambulating 134 ft without O2 and saturation drops to 79% HR up to 96.  O2 re-applied at 3 L vis [place].  O2 saturation up to 95% HR 65 after 5 minutes.   Shortness of breath with exertion noted.  Occasional cough reported with white tinged foam.  Continues with breathing treatments.  Oxygen tubing script is in place and new tubing has been received.  Patient also has a new light-weight portable tank.  Education provided on importance of keeping oxygen in place.    HISTORY OF PRESENT ILLNESS:  Shane Reid. is a 76 y.o. year old male  with multiple medical problems including Idiopathic pulmonary fibrosis, coronary artery disease s/p stent, COPD, congestive heart failure, dysphasia, hypertension, heart murmur, diabetes, COPD, asthma, chronic chest pain, gerd, anxiety.  CODE STATUS: DNR ADVANCED DIRECTIVES: No MOST FORM: No PPS: 50%   PHYSICAL EXAM:   VITALS: Today's  Vitals   03/27/22 0845  Pulse: 71  Temp: 97.7 F (36.5 C)  SpO2: 90%  Weight: 147 lb (66.7 kg)    LUNGS: clear to auscultation , decreased breath sounds CARDIAC: Cor RRR}  EXTREMITIES: see above SKIN: Skin color, texture, turgor normal. No rashes or lesions or dry skin noted.   NEURO: positive for gait problems       Lorenza Burton, RN

## 2022-04-06 ENCOUNTER — Telehealth: Payer: Self-pay | Admitting: Cardiovascular Disease

## 2022-04-06 MED ORDER — ROSUVASTATIN CALCIUM 10 MG PO TABS: 10.0000 mg | ORAL_TABLET | Freq: Every day | ORAL | 3 refills | Status: DC

## 2022-04-06 NOTE — Telephone Encounter (Signed)
*  STAT* If patient is at the pharmacy, call can be transferred to refill team.   1. Which medications need to be refilled? (please list name of each medication and dose if known) rosuvastatin (CRESTOR) 10 MG tablet   2. Which pharmacy/location (including street and city if local pharmacy) is medication to be sent to?  [id number] - [place], [place] - [address]    3. Do they need a 30 day or 90 day supply? 90

## 2022-04-10 ENCOUNTER — Telehealth: Payer: Self-pay

## 2022-04-10 NOTE — Telephone Encounter (Signed)
9 am.  Incoming call from patient.  He received a letter from [facility] stating Clyde Lundborg, NP is no longer an active provider for their insurance.  Patient will likely have to pay privately if he continues with this NP.  Advised patient that this provider is no longer with [facility] and he is currently followed by Alcide Clever and Janeth Rase, NP's.  Patient also advised he was told he no longer has Medicaid.  Encouraged him to follow up with [facility].

## 2022-04-12 ENCOUNTER — Other Ambulatory Visit: Payer: Medicare Other

## 2022-04-12 DIAGNOSIS — Z515 Encounter for palliative care: Secondary | ICD-10-CM

## 2022-04-12 NOTE — Progress Notes (Signed)
PATIENT NAME: Shane Reid. DOB: 1946/02/14 MRN: [id number]  PRIMARY CARE PROVIDER: Mickel Fuchs, MD  RESPONSIBLE PARTY:  Acct ID - Guarantor Home Phone Work Phone Relationship Acct Type  [id number] OSAMAH, SCHMADER* [phone]  Self P/F     [address] [place], [place], [place] [address]   I connected with  Shane Reid. on 04/12/22 by telephone and verified that I am speaking with the correct person using two identifiers.   I discussed the limitations of evaluation and management by telemedicine. The patient expressed understanding and agreed to proceed.   Patient called in voicing concerns over rising blood sugars.  He states they are consistently in the 200's and getting closer to 300's more recently.  No insulin use and states he is on jardiance.  Was on metformin a few years ago but but this was stopped.   Patient notes some blurry vision is occurring and he is concerned about his vision.  He is currently working to set up an appointment with his ophthalmologist for a follow up.  We discussed current diet which is mostly out of his control.  He receives meals on wheels and family is picking up groceries for him.  Patient typically eats a waffle with syrup or eggs and toast for breakfast.  Consistently eating white bread to make sandwiches on a daily basis.  Education provided on healthier choices for food options but patient is not certain his family will obtain recommended items.  Advised that I would follow up with his provider regarding his concerns.  Message left with Bethann Berkshire with [facility] regarding above.  Bethann Berkshire advised she is scheduled to see patient tomorrow and will address with patient's NP.          Truitt Merle, RN

## 2022-04-25 ENCOUNTER — Other Ambulatory Visit: Payer: Medicare Other

## 2022-04-25 VITALS — BP 130/74 | HR 81 | Temp 97.5°F

## 2022-04-25 DIAGNOSIS — Z515 Encounter for palliative care: Secondary | ICD-10-CM

## 2022-04-25 NOTE — Progress Notes (Signed)
PATIENT NAME: Shane Reid. DOB: 18-May-1946 MRN: [id number]  PRIMARY CARE PROVIDER: Mickel Fuchs, MD  RESPONSIBLE PARTY:  Acct ID - Guarantor Home Phone Work Phone Relationship Acct Type  [id number] Shane Reid, Shane Reid* [phone]  Self P/F     [address] [place], [place], [place] [address]   Blood Sugars:  Continue to run in the 200's.  Patient is working to adjust his diet and is asking for family to bring sugar-free items.  Ongoing education provided on diabetic diet.  PCP also aware and providing ongoing education.  Pain:  Patient has pain to right lower lobe/rib cage area when taking deep breaths, coughing, or touching this area.  No bruising noted to site.  Patient denies any trauma to area and no recent falls.  Patient notes this has been occurring for about 2 weeks but is not worse.  No fever reported.  Shortness of breath:  Patient continues with shortness of breath with exertion.   O2 sats drop to 85% ambulating 10-15 ft with O2 @ 2 L via [place].  Non-productive cough.   Notified Shane Reid with [facility] of current assessment and will update NP.   CODE STATUS: DNR ADVANCED DIRECTIVES: No MOST FORM: Yes PPS: 50%   PHYSICAL EXAM:   VITALS: Today's Vitals   04/25/22 1205  BP: 130/74  Pulse: 81  Temp: (!) 97.5 F (36.4 C)  SpO2: 94%    LUNGS:  Right lower lobe with rales, left lower lobe clear CARDIAC: Cor RRR}  EXTREMITIES: 1+ and pitting edema bilateral ankles SKIN: Skin color, texture, turgor normal. No rashes or lesions or ecchymoses - arm(s) left  NEURO: positive for dizziness and gait problems       Shane Merle, RN

## 2022-04-27 ENCOUNTER — Other Ambulatory Visit: Payer: Medicare Other

## 2022-04-28 ENCOUNTER — Other Ambulatory Visit: Payer: Medicare Other

## 2022-04-28 DIAGNOSIS — Z515 Encounter for palliative care: Secondary | ICD-10-CM

## 2022-04-28 NOTE — Progress Notes (Signed)
PATIENT NAME: Shane Reid. DOB: 1945/11/03 MRN: [id number]  PRIMARY CARE PROVIDER: Mickel Fuchs, MD  RESPONSIBLE PARTY:  Acct ID - Guarantor Home Phone Work Phone Relationship Acct Type  [id number] QUASIM, DOYON* [phone]  Self P/F     [address] [place], [place], [place] [address]    I connected with  Shane Reid. on 04/28/22 by telephone and verified that I am speaking with the correct person using two identifiers.   I discussed the limitations of evaluation and management by telemedicine. The patient expressed understanding and agreed to proceed.  Diabetes:  Patient endorses blood sugar of 320 and 357 yesterday.  He has spoken with PCP and Actos was increased to 45 mg daily.  Patient started this today.  Patient stated NP spoke with him about insulin and he is not agreeable to this.  He states he would not want to inject himself and the biggest factor. Patient will continue with routine blood sugar checks and notify PCP if levels continue to read high.  Pain:  Improved right rib cage pain.  Patient states he is taking prn oxycodone that has been effective.  Follow up visit scheduled for 05/09/22 at 9 am.   Truitt Merle, RN

## 2022-05-05 ENCOUNTER — Other Ambulatory Visit: Payer: Medicare Other

## 2022-05-05 DIAGNOSIS — Z515 Encounter for palliative care: Secondary | ICD-10-CM

## 2022-05-05 NOTE — Progress Notes (Signed)
PATIENT NAME: Shane Reid. DOB: 01-17-1946 MRN: [id number]  PRIMARY CARE PROVIDER: Mickel Fuchs, MD  RESPONSIBLE PARTY:  Acct ID - Guarantor Home Phone Work Phone Relationship Acct Type  [id number] [name], [name] [phone]  Self P/F     [address] [place], [place], [place] [address]  I connected with  Shane Reid. on 05/05/22 by telephone and verified that I am speaking with the correct person using two identifiers.   I discussed the limitations of evaluation and management by telemedicine. The patient expressed understanding and agreed to proceed.   Incoming call from patient.  He advised that he was having chest pain yesterday.  He took 1 nitroglycerin tablet that is about 77 years old.  Pain resolved.  Patient has contacted PCP and new order for nitroglycerin has been sent in.  Patient is waiting for son to pick this up.  No further issues reported.  Patient is scheduled for a follow up visit next Tuesday.     Truitt Merle, RN

## 2022-05-09 ENCOUNTER — Other Ambulatory Visit: Payer: Medicare Other

## 2022-05-09 VITALS — BP 136/76 | HR 61 | Temp 97.5°F | Wt 142.6 lb

## 2022-05-09 DIAGNOSIS — Z515 Encounter for palliative care: Secondary | ICD-10-CM

## 2022-05-09 NOTE — Progress Notes (Signed)
PATIENT NAME: Shane Reid. DOB: 08/15/1945 MRN: [id number]  PRIMARY CARE PROVIDER: Mickel Fuchs, MD  RESPONSIBLE PARTY:  Acct ID - Guarantor Home Phone Work Phone Relationship Acct Type  [id number] [name], [name]* [phone]  Self P/F     [address] [name], [place] [address]   Chest pain:  Now has new bottle of nitroglycerin tablets in the home.  No further issues with pain reported.  Diabetes:  Ongoing education provided on foods to eat.  Patient continues to struggle with his diet and advises his son is still getting groceries for him.  He is trying to limit processed and sweet foods.   Recent increase in Pioglitazone HCl 45 mg at the end of last month.  Blood sugar checks remain elevated in the 200-300's.   Life Alert:  Continues to be in place.  Patient is wearing his life alert and places it by the bedside at night.  Pain:  No further issues with right-sided rib cage pain.  Postherpetic Neuralgia:   Patient reports nerve pain around the back and left collar bone where he had singles in 2016/2019. We discussed possible options of lidocaine gel and gabapentin. He will follow up with [facility] regarding treatement.  Respiratory: Continues with dyspnea with exertion.  Remains on oxygen at 3 L via [place].  Occasional non-productive cough.  Denies fever or worsening cough.    CODE STATUS: DNR ADVANCED DIRECTIVES: No MOST FORM: Yes PPS: 50%   PHYSICAL EXAM:   VITALS: Today's Vitals   05/09/22 0849  BP: 136/76  Pulse: 61  Temp: (!) 97.5 F (36.4 C)  SpO2: 94%  Weight: 142 lb 9.6 oz (64.7 kg)    LUNGS: decreased breath sounds CARDIAC: Cor RRR}  EXTREMITIES: trace edema above the sock line on right lower extremity and 1+ edema above sock line on left lower extremity.  SKIN: Skin color, texture, turgor normal. No rashes or lesions or mobility and turgor normal  NEURO: positive for dizziness and gait problems       Truitt Merle, RN

## 2022-06-09 ENCOUNTER — Other Ambulatory Visit: Payer: 59

## 2022-06-09 VITALS — BP 124/64 | HR 71 | Temp 97.5°F

## 2022-06-09 DIAGNOSIS — Z515 Encounter for palliative care: Secondary | ICD-10-CM

## 2022-06-09 NOTE — Progress Notes (Signed)
PATIENT NAME: Shane Reid. DOB: 05-Oct-1945 MRN: [id number]  PRIMARY CARE PROVIDER: Elba Barman, MD  RESPONSIBLE PARTY:  Acct ID - Guarantor Home Phone Work Phone Relationship Acct Type  [id number] CON, [phone]  Self P/F     [place], [place], [place] [address]   Diabetes:  Continues with routine blood sugar checks.  Readings are now below 200.  Patient advised most readings are around 150.  He has cut back on sweets and eating more diabetic friendly foods.   Functional Status:  Continues to live independently with family bringing in items as needed.  Will take a taxi to [place] a month.  Able to cook meals and complete all ADL's.  Must pace himself and rest frequently due to shortness of breath.   Memory:  Patient frequently repeating stories about puzzles and family.  Patient forgetful he shared stories on last visit or phone call.   Respiratory:  Recently started on a flutter valve.  Re-educated patient on use and purpose.  Continues with productive cough.  Occasionally mucus is grey/white in color.  Shortness of breath with exertion and de-sats to the upper 80's.  Continues with O2 at 3 L via .   Safety:  Gait unsteady at times.  Patient wearing his life alert pendant when up.  Keeps it beside his bed at night.  No falls.   Follow up visit scheduled for next month.     CODE STATUS: DNR ADVANCED DIRECTIVES: No MOST FORM: No PPS: 50%   PHYSICAL EXAM:   VITALS: Today's Vitals   06/09/22 0858  BP: 124/64  Pulse: 71  Temp: (!) 97.5 F (36.4 C)  SpO2: 90%    LUNGS: clear to auscultation  CARDIAC: Cor RRR}  EXTREMITIES: - for edema SKIN: Skin color, texture, turgor normal. No rashes or lesions or mobility and turgor normal  NEURO: positive for gait problems       Lorenza Burton, RN

## 2022-06-13 ENCOUNTER — Other Ambulatory Visit: Payer: 59

## 2022-06-13 DIAGNOSIS — Z515 Encounter for palliative care: Secondary | ICD-10-CM

## 2022-06-13 NOTE — Progress Notes (Signed)
PATIENT NAME: Shane Reid. DOB: Aug 24, 1945 MRN: [id number]  PRIMARY CARE PROVIDER: Elba Barman, MD  RESPONSIBLE PARTY:  Acct ID - Guarantor Home Phone Work Phone Relationship Acct Type  [id number] RYAN, [phone]  Self P/F     [place], [place], [place] [address]   I connected with  Shane Reid. on 06/13/22 by telephone and verified that I am speaking with the correct person using two identifiers.   I discussed the limitations of evaluation and management by telemedicine. The patient expressed understanding and agreed to proceed.   Follow up call made to patient to assess overall status.  Right sided pain is mostly resolved.  PCP started patient on 40 mg of prednisone for 5 days per patient.  He notes issues with constipation.  Taking over the counter stool softener, miralax and also prune juice before getting relief.  Discussed remaining on a bowel regimen as to avoid issues with constipation.  Patient hesitant as he feels this does not always work.  Discussed diet alternations in addition to bowel regimen.  No further concerns voiced by patient.  Will follow up next month on scheduled visit date.      Lorenza Burton, RN

## 2022-06-22 ENCOUNTER — Other Ambulatory Visit: Payer: 59

## 2022-06-22 DIAGNOSIS — Z515 Encounter for palliative care: Secondary | ICD-10-CM

## 2022-06-22 NOTE — Progress Notes (Signed)
PATIENT NAME: Shane Reid. DOB: 1945/10/05 MRN: [id number]  PRIMARY CARE PROVIDER: Elba Barman, MD  RESPONSIBLE PARTY:  Acct ID - Guarantor Home Phone Work Phone Relationship Acct Type  [id number] NOLAND, [phone]  Self P/F     [place], [place], [place] [address]    I connected with  Shane Reid. on 06/22/22 by telephone and verified that I am speaking with the correct person using two identifiers.   I discussed the limitations of evaluation and management by telemedicine. The patient expressed understanding and agreed to proceed.   Patient left a message on 06/21/22 requesting a call back.  Return call made this am.  Patient reports concern for ongoing fatigue.  He believes this has worsened over the last month.  He wanted to go to [employer] yesterday but did not have the energy.  Continues with shortness of breath and productive cough.  Sputum remains white/greyish in appearance.  We discussed ILD and progression of diease as possible cause. Patient voiced understanding.  He has also placed a call into his PCP with update and is waiting for a call back. No other concerns voiced at this time.        Lorenza Burton, RN

## 2022-07-06 ENCOUNTER — Other Ambulatory Visit: Payer: 59

## 2022-07-06 DIAGNOSIS — Z515 Encounter for palliative care: Secondary | ICD-10-CM

## 2022-07-06 NOTE — Progress Notes (Signed)
PATIENT NAME: Shane Reid. DOB: Jun 20, 1945 MRN: [id number]  PRIMARY CARE PROVIDER: Elba Barman, MD  RESPONSIBLE PARTY:  Acct ID - Guarantor Home Phone Work Phone Relationship Acct Type  [id number] CAM, [phone]  Self P/F     [address] [place], [place] [address]   I connected with  Shannon-daughter in law for Lamarr Feenstra. on 07/06/22 by phone and verified that I am speaking with the correct person using two identifiers.   I discussed the limitations of evaluation and management by telemedicine. The patient expressed understanding and agreed to proceed.  Spoke with [facility] nurse Larena Glassman and she advised she has a scheduled appt today at 54 with patient.  She has been unable to reach him by phone.  I also attempted to reach patient but phone goes to VM.  Phone call made to [facility] (daughter-inl-law) for patient.  She advised patient has been turning his phone off at night and may still have his phone off.  She will go by and check on patient to make sure he is okay.  Larene Beach advised she felt patient has been doing well over all but having some depression from time to time.  Patient will usually call family when he if feeling lonely.   12 pm.  Incoming call from patient to advise he is okay and phone is working again.  [facility] to see him this afternoon and Palliative Care will follow up next week.  CODE STATUS: DNR ADVANCED DIRECTIVES: No MOST FORM:No PPS: 50%         Lorenza Burton, RN

## 2022-07-13 ENCOUNTER — Other Ambulatory Visit: Payer: 59

## 2022-07-13 VITALS — BP 112/68 | HR 75 | Temp 97.7°F

## 2022-07-13 DIAGNOSIS — Z515 Encounter for palliative care: Secondary | ICD-10-CM

## 2022-07-13 NOTE — Progress Notes (Signed)
PATIENT NAME: Shane Reid. DOB: [birth year] MRN: [id number]  PRIMARY CARE PROVIDER: Elba Barman, MD  RESPONSIBLE PARTY:  Acct ID - Guarantor Home Phone Work Phone Relationship Acct Type  [id number] JESSE, [phone]  Self P/F     [address] [place], [place] [address]   Home visit completed with patient.  Appetite:  Continues with meals on wheels.  Will do light meal prep and family continues to bring in groceries.  Patient reports good appetite.  Weight maintaining around 143 lbs per patient.   Constipation:  Patient recently started on Senna to address constipation.  He reports this is working well and he is having regular bowel movements.  Diabetes:  Blood sugars better managed.  Fasting 132 this am.  Patient is attempting to adjust his diet as his budget allows.  Mobility:  Continues to ambulate without assistive devices.   No falls reported.  Reports some dizziness when first getting up.  Safety:  Has life alert and wearing routinely.  Shortness of breath:  Ongoing with exertion.  Continues with O2 at 2.5 L via .  Frequent coughing with white/grey phlegm.  Continues with prn breathing treatments and taking prednisone daily.    CODE STATUS: DNR ADVANCED DIRECTIVES: N MOST FORM: No PPS: 50%   PHYSICAL EXAM:   VITALS: Today's Vitals   07/13/22 0906  BP: 112/68  Pulse: 75  Temp: 97.7 F (36.5 C)  SpO2: 92%    LUNGS: clear to auscultation  CARDIAC: Cor RRR}  EXTREMITIES: - for edema SKIN: Skin color, texture, turgor normal. No rashes or lesions or mobility and turgor normal  NEURO: positive for dizziness and gait problems       Lorenza Burton, RN

## 2022-07-24 ENCOUNTER — Other Ambulatory Visit: Payer: 59

## 2022-07-24 DIAGNOSIS — Z515 Encounter for palliative care: Secondary | ICD-10-CM

## 2022-07-24 NOTE — Progress Notes (Signed)
PATIENT NAME: Shane Reid. DOB: [birth year] MRN: [id number]  PRIMARY CARE PROVIDER: Elba Barman, MD  RESPONSIBLE PARTY:  Acct ID - Guarantor Home Phone Work Phone Relationship Acct Type  [id number] PERCEY, [phone]  Self P/F     [place], [place], [place] [address]   I connected with  Shane Reid. on 07/24/22 by telephone and verified that I am speaking with the correct person using two identifiers.   I discussed the limitations of evaluation and management by telemedicine. The patient expressed understanding and agreed to proceed.   CODE STATUS: DNR ADVANCED DIRECTIVES: No MOST FORM: No PPS: 50%  (850 am) Message received from patient requesting a call back.  Return call made to patient and he provides an update on recent changes.  COPD exacerbation noted last week with start of antibiotics, prednisone and florastor.  Patient notes his breathing has improved some but he feels his respiratory status has declined overall.  He reports dyspnea and fatigue as ongoing.   Blood sugar 110 this am.  Continues eating well.  Today's weight 147 lbs reported by patient.   Follow up visit scheduled for next month.      Lorenza Burton, RN

## 2022-07-28 ENCOUNTER — Other Ambulatory Visit: Payer: 59

## 2022-07-28 VITALS — Wt 145.0 lb

## 2022-07-28 DIAGNOSIS — Z515 Encounter for palliative care: Secondary | ICD-10-CM

## 2022-07-28 NOTE — Progress Notes (Signed)
PATIENT NAME: Shane Reid. DOB: 06/12/1945 MRN: [id number]  PRIMARY CARE PROVIDER: Elba Barman, MD  RESPONSIBLE PARTY:  Acct ID - Guarantor Home Phone Work Phone Relationship Acct Type  [id number] DENNON, [phone]  Self P/F     [place], [place], [place] [address]   I connected with  Shane Reid. on 07/28/22 by telephone and verified that I am speaking with the correct person using two identifiers.   I discussed the limitations of evaluation and management by telemedicine. The patient expressed understanding and agreed to proceed.   Depression:  Patient endorses feelings of sadness, tearfulness, loss of appetite, loss of interest in hobbies and fatigue.  We discussed pulmonary disease progression also a contributing factor to fatigue.   Active listening provided as patient does some life review.  Patient shared his sister is currently in the hospital with [facility].  He is concerned about her condition but has been able to communicate with her by telephone.  Patient shared his frustrations with being dependent on his son for groceries etc.   CODE STATUS: DNR ADVANCED DIRECTIVES: Yes-patient advises he recently completed paperwork for son to become financial power of attorney. MOST FORM: Yes PPS: 50%-weak   PHYSICAL EXAM:   VITALS: Today's Vitals   07/28/22 1010  Weight: 145 lb (65.8 kg)        Lorenza Burton, RN

## 2022-08-08 ENCOUNTER — Other Ambulatory Visit: Payer: 59

## 2022-08-08 VITALS — BP 110/64 | HR 73 | Temp 97.6°F | Wt 145.0 lb

## 2022-08-08 DIAGNOSIS — Z515 Encounter for palliative care: Secondary | ICD-10-CM

## 2022-08-08 NOTE — Progress Notes (Signed)
PATIENT NAME: Shane Reid. DOB: [birth year] MRN: [id number]  PRIMARY CARE PROVIDER: Elba Barman, MD  RESPONSIBLE PARTY:  Acct ID - Guarantor Home Phone Work Phone Relationship Acct Type  [id number] GERHARDT, [phone]  Self P/F     [address] [place], [place] [address]   Home visit completed with patient.   Appetite:  Patient endorses good appetite.  Receiving Meals on Wheels.   Family is bringing in groceries and patient is doing light meal prep.  Weight down to 145 lb.  Cough:  Continues with productive cough.  White in appearance.   Diabetes:  Patient voiced concern over elevated blood sugars.  Reviewed blood sugars and most recent readings are: 184, 313,360, 222, 356, 160, 165, 310, 179, 192, 129, 153, 152.  Patient currently on 20 mg of Prednisone.   Dyspnea:  Patient extremely short of breath when walking < 100 ft from back door to living room.  O2 saturations down to 71% on 2.5 L via .  Recovers to 92% after 5 minutes of complete rest.  Patient continues with nebulizer (duonebs) tid.  Patient verbalizes minimal activity causes dyspnea.  Currently on Levaquin and prednisone.  Will finish abt early next week and prednisone is for 1 month.   CODE STATUS: DNR ADVANCED DIRECTIVES: N MOST FORM: No PPS: 50%   PHYSICAL EXAM:   VITALS: Today's Vitals   08/08/22 0852  BP: 110/64  Pulse: 73  Temp: 97.6 F (36.4 C)  SpO2: 92%  Weight: 145 lb (65.8 kg)    LUNGS: clear to auscultation , decreased breath sounds CARDIAC: Cor RRR}  EXTREMITIES: - for edema. SKIN: Skin color, texture, turgor normal. No rashes or lesions or mobility and turgor normal  NEURO: positive for dizziness and gait problems       Lorenza Burton, RN

## 2022-08-22 ENCOUNTER — Other Ambulatory Visit: Payer: 59

## 2022-08-22 DIAGNOSIS — Z515 Encounter for palliative care: Secondary | ICD-10-CM

## 2022-08-22 NOTE — Progress Notes (Signed)
PATIENT NAME: Shane Reid. DOB: 1945-09-05 MRN: [id number]  PRIMARY CARE PROVIDER: Elba Barman, MD  RESPONSIBLE PARTY:  Acct ID - Guarantor Home Phone Work Phone Relationship Acct Type  [id number] ZACHAI, [phone]  Self P/F     [place], [place], [place] [address]   I connected with  Shane Reid. on 08/22/22 by telephone and verified that I am speaking with the correct person using two identifiers.   I discussed the limitations of evaluation and management by telemedicine. The patient expressed understanding and agreed to proceed.   Incoming call from patient.  Reports his great-granddaughter was sick this weekend and he was with her.  Today, feeling achy but no fever.  Continues with a productive cough with white foamy sputum.  No changes with breathing.  Patient has acetaminophen 325 mg prn q6 hours in place.  Instructed to take 1 tablet now to address muscle aches.  Patient aware he can call PCP or palliative care if symptoms worsen.      Lorenza Burton, RN

## 2022-09-04 ENCOUNTER — Other Ambulatory Visit: Payer: 59

## 2022-09-05 ENCOUNTER — Other Ambulatory Visit: Payer: 59

## 2022-09-05 DIAGNOSIS — Z515 Encounter for palliative care: Secondary | ICD-10-CM

## 2022-09-05 NOTE — Progress Notes (Signed)
PATIENT NAME: Shane Reid. DOB: 1945/08/14 MRN: [id number]  PRIMARY CARE PROVIDER: Mickel Fuchs, MD  RESPONSIBLE PARTY:  Acct ID - Guarantor Home Phone Work Phone Relationship Acct Type  [id number] [name], [name] [phone]  Self P/F     [address] [place], [place], [place] [address]   I connected with  Shane Reid. on 09/05/22 by telephone and verified that I am speaking with the correct person using two identifiers.   I discussed the limitations of evaluation and management by telemedicine. The patient expressed understanding and agreed to proceed.   Incoming call from patient asking what he can take as he does not feel well.  Asked patient to be more specific about symptoms.  He reports occasional chest discomfort and feeling fatigued.  We discussed use of nitro for chest pain.  Discussed activating life alert for EMS to evaluate.  Patient advised he would not allow transport to the ED for further evaluation.  He is willing to take nitro for chest pain.  Patient also checked his blood sugar and reports a reading of 524 and over 300 this am.  I have advised him to contact [facility] to address.  Discussed hospice support with patient.  He states he and his family have declined hospice support.  Patient states hospice did the same thing that [facility] and [facility] is currently doing.  I discussed patient has experienced disease progression since his discharge and he would benefit for additional support and close monitoring of symptoms.  Patient continues to decline evaluation.         Truitt Merle, RN

## 2022-09-06 ENCOUNTER — Other Ambulatory Visit: Payer: 59

## 2022-09-06 DIAGNOSIS — Z515 Encounter for palliative care: Secondary | ICD-10-CM

## 2022-09-06 NOTE — Progress Notes (Signed)
PATIENT NAME: Shane Reid. DOB: [birth year] MRN: [id number]  PRIMARY CARE PROVIDER: Mickel Fuchs, MD  RESPONSIBLE PARTY:  Acct ID - Guarantor Home Phone Work Phone Relationship Acct Type  [id number] [name], [name]* [phone]  Self P/F     [address] [place], [place], [place] [address]    I connected with  Shane Reid. on 09/06/22 by telephone and verified that I am speaking with the correct person using two identifiers.   I discussed the limitations of evaluation and management by telemedicine. The patient expressed understanding and agreed to proceed.   Incoming call from patient to advise blood sugar is 581.  He has not eaten anything today only consuming water.  Patient advised he spoke with [facility] yesterday and plan is to start insulin.  States his son and daughter-in-law are willing to assist if needed.  Phone call made to [facility] and spoke with Lisa-nurse.  She advised patient had refused insulin on yesterday's call.  She contacted patient this am to check on blood sugar and reading was over 200.  There is difficulty in dosing patient as he does not document readings.  I reviewed my note from 08/08/22 with readings from glucometer and advised that I would be seeing patient on Friday.  Misty Stanley has requested that I call back Friday with readings.   Truitt Merle, RN

## 2022-09-08 ENCOUNTER — Other Ambulatory Visit: Payer: 59

## 2022-09-08 VITALS — BP 130/68 | HR 93 | Temp 97.4°F

## 2022-09-08 DIAGNOSIS — Z515 Encounter for palliative care: Secondary | ICD-10-CM

## 2022-09-08 NOTE — Progress Notes (Signed)
PATIENT NAME: Shane Reid. DOB: 09-06-1945 MRN: [id number]  PRIMARY CARE PROVIDER: Mickel Fuchs, MD  RESPONSIBLE PARTY:  Acct ID - Guarantor Home Phone Work Phone Relationship Acct Type  [id number] MAMOUN, SPEARMAN* [phone]  Self P/F     [address] [name], [place] [address]   Home visit completed with patient.  Diabetes: Elevated blood sugars over the last week.  Time on glucometer is incorrect so unable to determine time of readings listed below.  Adjustment made on glucometer with correct time.  Now has an insulin pen but no needles.   Spoke with Lisa-nurse with [facility] and provided readings below.  She has called in the needles to [facility].  Connected with son by phone and he will have wife follow up with pharmacy.  Patient advised son is able to administer/teach the use of insulin pen as he is currently using one.  Insulin pen is not in the home but with son.  He will [facility] to [facility] once needles are picked up.   Readings:  09/03/22- 503 09/04/22 -470 09/05/22-348 09/04/21- 398 09/06/22 -524 410/24 -356 09/06/22- 205 09/07/22 - 581 09/07/22 - 541 09/07/22-  573 09/07/22- 237 09/07/22 - 283 09/07/22- 206 09/08/22- 313 09/08/22- 512 09/08/22 -269  Shortness of breath:  Patient endorses shortness of breath with minimal exertion. Continues on O2 at 3 L via [place].  O2 sats 88% on arrival but increased to 93% at rest.    Education:  Discussed importance of taking medication as prescribed. Discussed disease progression.  Discussed diabetic diet as patient advised he struggles with knowing what to eat on a day to day basis.       CODE STATUS: DNR ADVANCED DIRECTIVES: Yes-not on file MOST FORM: Yes PPS: 50%   PHYSICAL EXAM:   VITALS: Today's Vitals   09/08/22 0900  BP: 130/68  Pulse: 93  Temp: (!) 97.4 F (36.3 C)  SpO2: 92%    LUNGS: decreased breath sounds CARDIAC: Cor RRR}  EXTREMITIES: - for edema SKIN: Skin color, texture, turgor normal. No rashes or  lesions or mobility and turgor normal  NEURO: positive for dizziness and gait problems       Truitt Merle, RN

## 2022-09-11 ENCOUNTER — Other Ambulatory Visit: Payer: 59

## 2022-09-11 DIAGNOSIS — Z515 Encounter for palliative care: Secondary | ICD-10-CM

## 2022-09-11 NOTE — Progress Notes (Signed)
1610 Palliative Care  Follow Up Encounter Note   PATIENT NAME: Shane Reid. DOB: January 06, 1946 MRN: [id number]  PRIMARY CARE PROVIDER: Mickel Fuchs, MD  RESPONSIBLE PARTY:  Acct ID - Guarantor Home Phone Work Phone Relationship Acct Type  [id number] KEYONDRE, FATTORE* [phone]  Self P/F     [address] [name], [place] [address]   I connected with pt on 09/11/22 by telephone and verified that I am speaking with the correct person using two identifiers.   I discussed the limitations of evaluation and management by telemedicine. The patient expressed understanding and agreed to proceed.    HISTORY OF PRESENT ILLNESS:  77 yo year old male  with multiple medical problems including Idiopathic pulmonary fibrosis, coronary artery disease s/p stent, COPD, congestive heart failure, dysphasia, hypertension, heart murmur, diabetes, COPD, asthma, chronic chest pain, gerd, anxiety.    Blood sugars: RN inquired about pts blood sugars. Reports 0747- BS-188  / 1200- BS-399 / 1600 BS 277 while on phone with RN. States, "I am taking my insulin in the morning and at night like I was told to."   Cognitive: Alert and oriented x 4. Answers questions appropriately. Participates in conversation.   Respiratory: Pt sounds very short of breath when RN first called. Pt states, I get short of breath when I walk. Reports wearing his O2 at 3Lpm via N/C at all times. Also reports that he takes breathing treatments 3 times per day. Pt reports that his ankles and feet are a little more swollen than normal.  Pain: Pt denies any specific pain, but reports that he feels funny. Not able to specify funny feelings. Denies any chest pain.    Palliative Care/ Hospice: Pt states, he used to be in Hospice. Not interested in another eval.   Next appt scheduled: RN to call again in one week.          Barbette Merino, RN

## 2022-09-22 ENCOUNTER — Other Ambulatory Visit: Payer: 59

## 2022-09-22 DIAGNOSIS — Z515 Encounter for palliative care: Secondary | ICD-10-CM

## 2022-09-22 NOTE — Progress Notes (Signed)
1124 Palliative Care  Follow Up Encounter Note   PATIENT NAME: Shane Reid. DOB: 26-May-1946 MRN: [id number]  PRIMARY CARE PROVIDER: Mickel Fuchs, MD  RESPONSIBLE PARTY:  Acct ID - Guarantor Home Phone Work Phone Relationship Acct Type  [id number] Shane Reid, Shane Reid* [phone]  Self P/F     [address] [name], [place] [address]     I connected with pt on 09/22/22 by telephone and verified that I am speaking with the correct person using two identifiers.   I discussed the limitations of evaluation and management by telemedicine. The patient expressed understanding and agreed to proceed.      HISTORY OF PRESENT ILLNESS:  77 yo year old male  with multiple medical problems including Idiopathic pulmonary fibrosis, coronary artery disease s/p stent, COPD, congestive heart failure, dysphasia, hypertension, heart murmur, diabetes, COPD, asthma, chronic chest pain, gerd, anxiety.   Blood Sugars: NP from [facility] increased both am and pm doses. Started increased doses this am. Pt states, "I have it planned out. I check my blood sugars three times a day and write them down."  RN discussed s/s of low blood sugar with pt and what to do if he feels them. Pt voiced understanding.   Respiratory: Oxygen machine went out yesterday. Cleaned filter and turned it back on and yellow light wouldn't change to green. Pt called supplier and they came and switched out machines. New one is working good.    Next appt scheduled: RN to call in and check on pt next week.      Barbette Merino, RN

## 2022-09-28 ENCOUNTER — Other Ambulatory Visit: Payer: Self-pay

## 2022-09-28 NOTE — Progress Notes (Deleted)
1456 Palliative Care  Follow Up Encounter Note   PATIENT NAME: Shane Reid. DOB: 1946/03/31 MRN: [id number]  PRIMARY CARE PROVIDER: Mickel Fuchs, MD  RESPONSIBLE PARTY: There is no guarantor information entered for this encounter.    HISTORY OF PRESENT ILLNESS:    CODE STATUS:   Code Status: Prior  ADVANCED DIRECTIVES: N MOST FORM: {Responses; yes/no} PPS: {NUMBERS 0%-100%:21292}   PHYSICAL EXAM:   VITALS:There were no vitals filed for this visit.  LUNGS: {SYSTEM LUNGS ADULT/PED WUJW:11914} CARDIAC: {Mis exam cardio:32073}} *** EXTREMITIES: {Exam; extremity:10330} SKIN: {Findings; skin exam-one line:31329::"Skin color, texture, turgor normal. No rashes or lesions"}  NEURO: {Findings; ROS neuro:30532::"negative"}       Barbette Merino, RN

## 2022-09-28 NOTE — Progress Notes (Unsigned)
1456 Palliative Care  Follow Up Encounter Note   PATIENT NAME: Shane Reid. DOB: 11-07-1945 MRN: [id number]  PRIMARY CARE PROVIDER: Mickel Fuchs, MD  RESPONSIBLE PARTY: There is no guarantor information entered for this encounter.  I connected with pt on 09/28/22 by telephone and verified that I am speaking with the correct person using two identifiers.   I discussed the limitations of evaluation and management by telemedicine. The patient expressed understanding and agreed to proceed.    HISTORY OF PRESENT ILLNESS:  77 yo year old male  with multiple medical problems including Idiopathic pulmonary fibrosis, coronary artery disease s/p stent, COPD, congestive heart failure, dysphasia, hypertension, heart murmur, diabetes, COPD, asthma, chronic chest pain, gerd, anxiety.   Blood Sugars: Pt reports still taking insulin twice a day. 20 units in the am and 15 units at night. States, "it has not been changed but that once." Reports a BS of 61 this am before breakfast. Reports that he called the NP at [facility] and reported it. He then ate some cereal and it came up to 137 at 11am. He read off blood sugars for the last two days.   RN reinforced s/s of low blood sugar with pt and what to do if he feels them. Pt voiced understanding. Also reminded pt to make sure he ate a snack at bedtime.    Respiratory: "My breathing is rough. It is acting up right now. I am coughing." RN asked if he is checking his sats. He says no, but he checked while on the phone with RN- sats are     Next appt scheduled: RN to make home visit next week on 5/9.     Barbette Merino, RN

## 2022-10-05 ENCOUNTER — Other Ambulatory Visit: Payer: 59

## 2022-10-05 DIAGNOSIS — Z515 Encounter for palliative care: Secondary | ICD-10-CM

## 2022-10-16 ENCOUNTER — Other Ambulatory Visit: Payer: 59

## 2022-10-16 DIAGNOSIS — Z515 Encounter for palliative care: Secondary | ICD-10-CM

## 2022-10-16 NOTE — Progress Notes (Signed)
PATIENT NAME: Shane Reid. DOB: 1945/07/11 MRN: [id number]  PRIMARY CARE PROVIDER: Mickel Fuchs, MD  RESPONSIBLE PARTY:  Acct ID - Guarantor Home Phone Work Phone Relationship Acct Type  [id number] [name], [name] [phone]  Self P/F     [address] [place], [place], [place] [address]   I connected with  Shane Reid. on 10/16/22 by telephone and verified that I am speaking with the correct person using two identifiers.   I discussed the limitations of evaluation and management by telemedicine. The patient expressed understanding and agreed to proceed.   Connected with patient to complete a telephonic visit and schedule in-person visit for next week.  Patient shares his lung condition seems worse to him.  He experiences dyspnea with minimal exertion and reports ongoing fatigue.  Has used nitro 2-3 x in the last month for chest pain.  Patient states he will not go to the ER should the pain be unresolved.  Family has spoken to him to about having more support in the home or possibly moving in with them.  Patient does not wish to move in with his son but would like to remain independent for as long as possible.  He has traveled to [employer] by taxi and reports walking in the store became very challenging due to dyspnea.  Follow up home visit scheduled for next Friday.  Will speak with patient again about hospice support.      Truitt Merle, RN

## 2022-10-18 ENCOUNTER — Other Ambulatory Visit: Payer: 59

## 2022-10-18 VITALS — BP 104/68 | HR 93 | Temp 97.6°F | Wt 141.0 lb

## 2022-10-18 DIAGNOSIS — Z515 Encounter for palliative care: Secondary | ICD-10-CM

## 2022-10-18 NOTE — Progress Notes (Signed)
PATIENT NAME: Shane Reid. DOB: 1945/08/07 MRN: [id number]  PRIMARY CARE PROVIDER: Mickel Fuchs, MD  RESPONSIBLE PARTY:  Acct ID - Guarantor Home Phone Work Phone Relationship Acct Type  [id number] CLEBERT, PEREDA* [phone]  Self P/F     [address] [name], [place] [address]   Home visit completed with patient.  Connected with son Casimiro Needle via telephone.  ACP:  Currently DNR.  Discussed MOST form at length with both patient and son. Discussed advanced lung disease and current condition. Patient is uncertain what his wishes are at this time.  He does not feel like he would want intubation/ventilator support given his advanced lung disease.  Blank form left in the home and son already has a blank form.   Chest Pain: Reports frequent bouts of chest pain located on the left side of his chest.  No radiation.  Describes as a throbbing sensation.   Has taken nitro 3 x yesterday but no significant improvement.  Patient states he will not go to the ED.  Depression:  Patient's wife passed away 2 years ago this November 19, 2022.  He reports feelings of sadness and missing his wife.  Son stated he would be taking patient out this weekend so he is able to spend time with family.  Diabetes:  Checking blood sugars 4 x daily.  Fasting am ranges from 60-289,  Lunch ranges from 82-267, Dinner ranges from 98-350 and bedtime ranges 129-378.  ILD:  Patient is reporting worsening fatigue.  Having a harder time completing household chores due to fatigue and shortness of breath.  Increase coughing reported.  Benzonatate was ordered by PCP but patient states it cost $64 and he declined this from the pharmacy.  Currently on O2 @ 3 L via [place].   Desats to the 70's with minimal exertion.  Takes 5 minutes to recover at rest.   Education provided on disease progression.   Hospice:  Discussed hospice support with patient.  He was previously under hospice almost 2 years ago.  Patient is uncertain if he would want to go  back under services.  Feels hospice hastened his wife's death.  Education provided on hospice support and philosophy.     CODE STATUS: DNR ADVANCED DIRECTIVES: No MOST FORM: No PPS: 50%   PHYSICAL EXAM:   VITALS: Today's Vitals   10/18/22 0836  BP: 104/68  Pulse: 93  Temp: 97.6 F (36.4 C)  SpO2: 90%  Weight: 141 lb (64 kg)    LUNGS: clear to auscultation  CARDIAC: Cor RRR}  EXTREMITIES: - for edema SKIN: Skin color, texture, turgor normal. No rashes or lesions or mobility and turgor normal and left great toe with scab in place.  PCP aware per patient.   NEURO: positive for gait problems, weakness, and fatigue       Truitt Merle, RN

## 2022-10-18 NOTE — Progress Notes (Signed)
8469 Palliative Care Follow Up Encounter Note  PATIENT NAME: Shane Reid. DOB: 11-10-1945 MRN: [id number]  PRIMARY CARE PROVIDER: Mickel Fuchs, MD  RESPONSIBLE PARTY:  Acct ID - Guarantor Home Phone Work Phone Relationship Acct Type  [id number] [name], [name] [phone]  Self P/F     [address] [name], [place] [address]   I connected with pt on 10/05/22 by telephone and verified that I am speaking with the correct person using two identifiers.   I discussed the limitations of evaluation and management by telemedicine. The patient expressed understanding and agreed to proceed.    HISTORY OF PRESENT ILLNESS:  77 y.o. male patient with a history of CHF, stage IV CKD and gout.    Cardiac:  "I am not doing too good at all. I have all this swelling, and my breathing is terrible. I went back to the doctor and had a test done on my heart. He said my heart was too weak for dialysis. I feel like I am just not doing too good. They say my heart is the last stages and I have less than 6 months to live." Asked for nurse to come and see me? Hospice would make me leave my house." RN tried to talk with pt about hospice and pt was not able to understand. Will notify the home care team of request for visit.   Next appt scheduled: Pt agreeable to frequent check in calls.   Barbette Merino, RN

## 2022-10-24 ENCOUNTER — Emergency Department: Payer: 59

## 2022-10-24 ENCOUNTER — Inpatient Hospital Stay: Payer: 59

## 2022-10-24 ENCOUNTER — Inpatient Hospital Stay
Admission: EM | Admit: 2022-10-24 | Discharge: 2022-10-26 | DRG: 180 | Disposition: A | Discharge status: Hospice | Payer: 59 | Attending: Internal Medicine | Admitting: Internal Medicine

## 2022-10-24 DIAGNOSIS — J90 Pleural effusion, not elsewhere classified: Secondary | ICD-10-CM | POA: Diagnosis not present

## 2022-10-24 DIAGNOSIS — F32A Depression, unspecified: Secondary | ICD-10-CM | POA: Diagnosis present

## 2022-10-24 DIAGNOSIS — E119 Type 2 diabetes mellitus without complications: Secondary | ICD-10-CM

## 2022-10-24 DIAGNOSIS — J84112 Idiopathic pulmonary fibrosis: Secondary | ICD-10-CM | POA: Diagnosis present

## 2022-10-24 DIAGNOSIS — C3491 Malignant neoplasm of unspecified part of right bronchus or lung: Principal | ICD-10-CM | POA: Diagnosis present

## 2022-10-24 DIAGNOSIS — J9601 Acute respiratory failure with hypoxia: Secondary | ICD-10-CM

## 2022-10-24 DIAGNOSIS — J470 Bronchiectasis with acute lower respiratory infection: Secondary | ICD-10-CM | POA: Diagnosis present

## 2022-10-24 DIAGNOSIS — E1165 Type 2 diabetes mellitus with hyperglycemia: Secondary | ICD-10-CM | POA: Diagnosis present

## 2022-10-24 DIAGNOSIS — I272 Pulmonary hypertension, unspecified: Secondary | ICD-10-CM | POA: Diagnosis present

## 2022-10-24 DIAGNOSIS — B0229 Other postherpetic nervous system involvement: Secondary | ICD-10-CM | POA: Diagnosis present

## 2022-10-24 DIAGNOSIS — J9819 Other pulmonary collapse: Secondary | ICD-10-CM | POA: Diagnosis present

## 2022-10-24 DIAGNOSIS — Z888 Allergy status to other drugs, medicaments and biological substances status: Secondary | ICD-10-CM

## 2022-10-24 DIAGNOSIS — E785 Hyperlipidemia, unspecified: Secondary | ICD-10-CM | POA: Diagnosis present

## 2022-10-24 DIAGNOSIS — R0602 Shortness of breath: Secondary | ICD-10-CM

## 2022-10-24 DIAGNOSIS — Z66 Do not resuscitate: Secondary | ICD-10-CM | POA: Diagnosis present

## 2022-10-24 DIAGNOSIS — Z955 Presence of coronary angioplasty implant and graft: Secondary | ICD-10-CM

## 2022-10-24 DIAGNOSIS — I509 Heart failure, unspecified: Secondary | ICD-10-CM | POA: Diagnosis present

## 2022-10-24 DIAGNOSIS — G8929 Other chronic pain: Secondary | ICD-10-CM | POA: Diagnosis present

## 2022-10-24 DIAGNOSIS — J189 Pneumonia, unspecified organism: Secondary | ICD-10-CM | POA: Diagnosis present

## 2022-10-24 DIAGNOSIS — F419 Anxiety disorder, unspecified: Secondary | ICD-10-CM | POA: Diagnosis present

## 2022-10-24 DIAGNOSIS — Z7952 Long term (current) use of systemic steroids: Secondary | ICD-10-CM | POA: Diagnosis not present

## 2022-10-24 DIAGNOSIS — I2510 Atherosclerotic heart disease of native coronary artery without angina pectoris: Secondary | ICD-10-CM | POA: Diagnosis present

## 2022-10-24 DIAGNOSIS — Z8249 Family history of ischemic heart disease and other diseases of the circulatory system: Secondary | ICD-10-CM

## 2022-10-24 DIAGNOSIS — J9383 Other pneumothorax: Secondary | ICD-10-CM | POA: Diagnosis not present

## 2022-10-24 DIAGNOSIS — J948 Other specified pleural conditions: Secondary | ICD-10-CM | POA: Diagnosis present

## 2022-10-24 DIAGNOSIS — J440 Chronic obstructive pulmonary disease with acute lower respiratory infection: Secondary | ICD-10-CM | POA: Diagnosis present

## 2022-10-24 DIAGNOSIS — I110 Hypertensive heart disease with heart failure: Secondary | ICD-10-CM | POA: Diagnosis present

## 2022-10-24 DIAGNOSIS — R0789 Other chest pain: Secondary | ICD-10-CM | POA: Diagnosis present

## 2022-10-24 DIAGNOSIS — Z794 Long term (current) use of insulin: Secondary | ICD-10-CM | POA: Diagnosis not present

## 2022-10-24 DIAGNOSIS — E7800 Pure hypercholesterolemia, unspecified: Secondary | ICD-10-CM | POA: Diagnosis present

## 2022-10-24 DIAGNOSIS — Z818 Family history of other mental and behavioral disorders: Secondary | ICD-10-CM

## 2022-10-24 DIAGNOSIS — I10 Essential (primary) hypertension: Secondary | ICD-10-CM | POA: Diagnosis not present

## 2022-10-24 DIAGNOSIS — R7402 Elevation of levels of lactic acid dehydrogenase (LDH): Secondary | ICD-10-CM | POA: Diagnosis present

## 2022-10-24 DIAGNOSIS — Z9981 Dependence on supplemental oxygen: Secondary | ICD-10-CM

## 2022-10-24 DIAGNOSIS — Z79899 Other long term (current) drug therapy: Secondary | ICD-10-CM

## 2022-10-24 DIAGNOSIS — Z1152 Encounter for screening for COVID-19: Secondary | ICD-10-CM | POA: Diagnosis not present

## 2022-10-24 DIAGNOSIS — Z7982 Long term (current) use of aspirin: Secondary | ICD-10-CM

## 2022-10-24 DIAGNOSIS — J910 Malignant pleural effusion: Secondary | ICD-10-CM | POA: Diagnosis present

## 2022-10-24 DIAGNOSIS — K219 Gastro-esophageal reflux disease without esophagitis: Secondary | ICD-10-CM | POA: Diagnosis present

## 2022-10-24 DIAGNOSIS — J95811 Postprocedural pneumothorax: Secondary | ICD-10-CM | POA: Diagnosis not present

## 2022-10-24 DIAGNOSIS — Z7984 Long term (current) use of oral hypoglycemic drugs: Secondary | ICD-10-CM

## 2022-10-24 DIAGNOSIS — J9621 Acute and chronic respiratory failure with hypoxia: Secondary | ICD-10-CM | POA: Diagnosis present

## 2022-10-24 DIAGNOSIS — Z87891 Personal history of nicotine dependence: Secondary | ICD-10-CM

## 2022-10-24 DIAGNOSIS — R590 Localized enlarged lymph nodes: Secondary | ICD-10-CM | POA: Diagnosis present

## 2022-10-24 DIAGNOSIS — E782 Mixed hyperlipidemia: Secondary | ICD-10-CM | POA: Diagnosis not present

## 2022-10-24 DIAGNOSIS — E1140 Type 2 diabetes mellitus with diabetic neuropathy, unspecified: Secondary | ICD-10-CM | POA: Diagnosis present

## 2022-10-24 DIAGNOSIS — I25118 Atherosclerotic heart disease of native coronary artery with other forms of angina pectoris: Secondary | ICD-10-CM | POA: Diagnosis not present

## 2022-10-24 DIAGNOSIS — R0902 Hypoxemia: Secondary | ICD-10-CM

## 2022-10-24 LAB — CBC WITH DIFFERENTIAL/PLATELET
Abs Immature Granulocytes: 0.07 10*3/uL (ref 0.00–0.07)
Basophils Absolute: 0 10*3/uL (ref 0.0–0.1)
Basophils Relative: 0 %
Eosinophils Absolute: 0 10*3/uL (ref 0.0–0.5)
Eosinophils Relative: 0 %
HCT: 47.6 % (ref 39.0–52.0)
Hemoglobin: 15 g/dL (ref 13.0–17.0)
Immature Granulocytes: 1 %
Lymphocytes Relative: 2 %
Lymphs Abs: 0.2 10*3/uL — ABNORMAL LOW (ref 0.7–4.0)
MCH: 28.1 pg (ref 26.0–34.0)
MCHC: 31.5 g/dL (ref 30.0–36.0)
MCV: 89.3 fL (ref 80.0–100.0)
Monocytes Absolute: 0.3 10*3/uL (ref 0.1–1.0)
Monocytes Relative: 3 %
Neutro Abs: 11 10*3/uL — ABNORMAL HIGH (ref 1.7–7.7)
Neutrophils Relative %: 94 %
Platelets: 311 10*3/uL (ref 150–400)
RBC: 5.33 MIL/uL (ref 4.22–5.81)
RDW: 15.3 % (ref 11.5–15.5)
WBC: 11.7 10*3/uL — ABNORMAL HIGH (ref 4.0–10.5)
nRBC: 0 % (ref 0.0–0.2)

## 2022-10-24 LAB — BLOOD GAS, VENOUS
Acid-Base Excess: 5.1 mmol/L — ABNORMAL HIGH (ref 0.0–2.0)
Bicarbonate: 30.5 mmol/L — ABNORMAL HIGH (ref 20.0–28.0)
O2 Saturation: 87.3 %
Patient temperature: 37
pCO2, Ven: 47 mmHg (ref 44–60)
pH, Ven: 7.42 (ref 7.25–7.43)
pO2, Ven: 53 mmHg — ABNORMAL HIGH (ref 32–45)

## 2022-10-24 LAB — BASIC METABOLIC PANEL
Anion gap: 11 (ref 5–15)
BUN: 21 mg/dL (ref 8–23)
CO2: 29 mmol/L (ref 22–32)
Calcium: 9.1 mg/dL (ref 8.9–10.3)
Chloride: 98 mmol/L (ref 98–111)
Creatinine, Ser: 0.9 mg/dL (ref 0.61–1.24)
GFR, Estimated: >60 mL/min (ref >60)
Glucose, Bld: 263 mg/dL — ABNORMAL HIGH (ref 70–99)
Potassium: 4.6 mmol/L (ref 3.5–5.1)
Sodium: 138 mmol/L (ref 135–145)

## 2022-10-24 LAB — AMYLASE, PLEURAL OR PERITONEAL FLUID: Amylase, Fluid: 238 U/L

## 2022-10-24 LAB — RESP PANEL BY RT-PCR (RSV, FLU A&B, COVID)  RVPGX2
Influenza A by PCR: NEGATIVE
Influenza B by PCR: NEGATIVE
Resp Syncytial Virus by PCR: NEGATIVE
SARS Coronavirus 2 by RT PCR: NEGATIVE

## 2022-10-24 LAB — LACTATE DEHYDROGENASE, PLEURAL OR PERITONEAL FLUID: LD, Fluid: 458 U/L — ABNORMAL HIGH (ref 3–23)

## 2022-10-24 LAB — PROCALCITONIN: Procalcitonin: 0.11 ng/mL

## 2022-10-24 LAB — CBG MONITORING, ED
Glucose-Capillary: 218 mg/dL — ABNORMAL HIGH (ref 70–99)
Glucose-Capillary: 159 mg/dL — ABNORMAL HIGH (ref 70–99)

## 2022-10-24 LAB — GLUCOSE, PLEURAL OR PERITONEAL FLUID: Glucose, Fluid: 129 mg/dL

## 2022-10-24 LAB — PROTEIN, PLEURAL OR PERITONEAL FLUID: Total protein, fluid: 4.2 g/dL

## 2022-10-24 MED ORDER — GUAIFENESIN-CODEINE 100-10 MG/5ML PO SOLN: 5.0000 mL | ORAL | Status: DC | PRN

## 2022-10-24 MED ORDER — HYDROCODONE-ACETAMINOPHEN 5-325 MG PO TABS
1.0000 | ORAL_TABLET | ORAL | Status: DC | PRN
  Filled 2022-10-24: qty 1

## 2022-10-24 MED ORDER — INSULIN ASPART 100 UNIT/ML IJ SOLN
0.0000 [IU] | Freq: Three times a day (TID) | INTRAMUSCULAR | Status: DC
  Administered 2022-10-24: 3 [IU] via SUBCUTANEOUS
  Administered 2022-10-25: 7 [IU] via SUBCUTANEOUS
  Administered 2022-10-25: 1 [IU] via SUBCUTANEOUS
  Administered 2022-10-25: 3 [IU] via SUBCUTANEOUS
  Filled 2022-10-24 (×4): qty 1

## 2022-10-24 MED ORDER — QUETIAPINE FUMARATE 25 MG PO TABS
25.0000 mg | ORAL_TABLET | Freq: Every day | ORAL | Status: DC
  Administered 2022-10-24 – 2022-10-25 (×2): 25 mg via ORAL
  Filled 2022-10-24 (×2): qty 1

## 2022-10-24 MED ORDER — ENOXAPARIN SODIUM 40 MG/0.4ML IJ SOSY
40.0000 mg | PREFILLED_SYRINGE | INTRAMUSCULAR | Status: DC
  Administered 2022-10-24: 40 mg via SUBCUTANEOUS
  Filled 2022-10-24 (×2): qty 0.4

## 2022-10-24 MED ORDER — IPRATROPIUM-ALBUTEROL 0.5-2.5 (3) MG/3ML IN SOLN
6.0000 mL | Freq: Once | RESPIRATORY_TRACT | Status: AC
  Administered 2022-10-24: 6 mL via RESPIRATORY_TRACT
  Filled 2022-10-24: qty 6

## 2022-10-24 MED ORDER — SODIUM CHLORIDE 0.9 % IV SOLN
500.0000 mg | Freq: Once | INTRAVENOUS | Status: AC
  Administered 2022-10-24: 500 mg via INTRAVENOUS
  Filled 2022-10-24: qty 5

## 2022-10-24 MED ORDER — PANTOPRAZOLE SODIUM 40 MG PO TBEC
40.0000 mg | DELAYED_RELEASE_TABLET | Freq: Every day | ORAL | Status: DC
  Administered 2022-10-25 – 2022-10-26 (×2): 40 mg via ORAL
  Filled 2022-10-24 (×2): qty 1

## 2022-10-24 MED ORDER — EMPAGLIFLOZIN 25 MG PO TABS
25.0000 mg | ORAL_TABLET | Freq: Every day | ORAL | Status: DC
  Administered 2022-10-25 – 2022-10-26 (×2): 25 mg via ORAL
  Filled 2022-10-24 (×2): qty 1

## 2022-10-24 MED ORDER — PREDNISONE 20 MG PO TABS
20.0000 mg | ORAL_TABLET | Freq: Every day | ORAL | Status: DC
  Administered 2022-10-25 – 2022-10-26 (×2): 20 mg via ORAL
  Filled 2022-10-24 (×2): qty 1

## 2022-10-24 MED ORDER — INSULIN ASPART 100 UNIT/ML IJ SOLN: 0.0000 [IU] | Freq: Every day | INTRAMUSCULAR | Status: DC

## 2022-10-24 MED ORDER — SODIUM CHLORIDE 0.9% FLUSH
20.0000 mL | Freq: Four times a day (QID) | INTRAVENOUS | Status: DC
  Administered 2022-10-24 – 2022-10-25 (×3): 20 mL via INTRAPLEURAL

## 2022-10-24 MED ORDER — DOCUSATE SODIUM 100 MG PO CAPS
100.0000 mg | ORAL_CAPSULE | Freq: Every day | ORAL | Status: DC
  Administered 2022-10-25 – 2022-10-26 (×2): 100 mg via ORAL
  Filled 2022-10-24 (×2): qty 1

## 2022-10-24 MED ORDER — AMLODIPINE BESYLATE 5 MG PO TABS
5.0000 mg | ORAL_TABLET | Freq: Every day | ORAL | Status: DC
  Administered 2022-10-24 – 2022-10-26 (×3): 5 mg via ORAL
  Filled 2022-10-24 (×3): qty 1

## 2022-10-24 MED ORDER — LACTULOSE 10 GM/15ML PO SOLN: 20.0000 g | Freq: Every day | ORAL | Status: DC | PRN

## 2022-10-24 MED ORDER — ALBUTEROL SULFATE (2.5 MG/3ML) 0.083% IN NEBU: 2.5000 mg | INHALATION_SOLUTION | RESPIRATORY_TRACT | Status: DC | PRN

## 2022-10-24 MED ORDER — FUROSEMIDE 10 MG/ML IJ SOLN
60.0000 mg | Freq: Once | INTRAMUSCULAR | Status: AC
  Administered 2022-10-24: 60 mg via INTRAVENOUS
  Filled 2022-10-24: qty 8

## 2022-10-24 MED ORDER — SODIUM CHLORIDE 0.9 % IV SOLN
500.0000 mg | INTRAVENOUS | Status: DC
  Administered 2022-10-25: 500 mg via INTRAVENOUS
  Filled 2022-10-24: qty 5

## 2022-10-24 MED ORDER — ROSUVASTATIN CALCIUM 10 MG PO TABS
10.0000 mg | ORAL_TABLET | Freq: Every day | ORAL | Status: DC
  Administered 2022-10-25 – 2022-10-26 (×2): 10 mg via ORAL
  Filled 2022-10-24 (×2): qty 1

## 2022-10-24 MED ORDER — ASPIRIN 81 MG PO TBEC
81.0000 mg | DELAYED_RELEASE_TABLET | Freq: Every day | ORAL | Status: DC
  Administered 2022-10-25 – 2022-10-26 (×2): 81 mg via ORAL
  Filled 2022-10-24 (×2): qty 1

## 2022-10-24 MED ORDER — SODIUM CHLORIDE 0.9 % IV SOLN
1.0000 g | INTRAVENOUS | Status: DC
  Administered 2022-10-25: 1 g via INTRAVENOUS
  Filled 2022-10-24: qty 10

## 2022-10-24 MED ORDER — PREGABALIN 50 MG PO CAPS
100.0000 mg | ORAL_CAPSULE | Freq: Two times a day (BID) | ORAL | Status: DC
  Administered 2022-10-24 – 2022-10-26 (×4): 100 mg via ORAL
  Filled 2022-10-24 (×4): qty 2

## 2022-10-24 MED ORDER — SODIUM CHLORIDE 0.9 % IV SOLN
1.0000 g | Freq: Once | INTRAVENOUS | Status: AC
  Administered 2022-10-24: 1 g via INTRAVENOUS
  Filled 2022-10-24: qty 10

## 2022-10-24 NOTE — ED Notes (Signed)
Respiratory at bedside and patient placed on bipap.

## 2022-10-24 NOTE — H&P (Signed)
History and Physical    Patient: Shane Reid. ZOX:[id number] DOB: 1946-03-18 DOA: 10/24/2022 DOS: the patient was seen and examined on 10/24/2022 PCP: Mickel Fuchs, MD  Patient coming from: Home  Chief Complaint: Shortness of breath Chief Complaint  Patient presents with   Shortness of Breath   HPI: Shane Reid. is a 77 y.o. male with medical history significant of severe pulmonary fibrosis follows up with a pulmonologist, coronary artery disease status post 4 stent placement, COPD, diabetes mellitus with neuropathy, GERD, hyperlipidemia, hypertension, hypertensive heart disease who has been having gradual worsening of baseline shortness of breath for the last 1 month.  According to patient shortness of breath was associated with dry cough but no fever, chest pain, nausea vomiting or abdominal pain.  He follows up with cardiologist Dr. Mariah Milling.  His shortness of breath became more intense within the last few days and therefore called EMS to be brought to the emergency room for further management  ED course: Upon arrival to the emergency room temperature 97.7 pulse 111 blood pressure 144/94 saturating 82 on 50%. Patient was initially placed on nonrebreather oxygenation and saturation was in the 70s and subsequently placed on BiPAP with improvement in saturation to the high 90s. Chest x-ray obtained showed large right-sided pleural effusion as well as bilateral patchy opacities concerning for possible pneumonia/pulmonary edema. Hospitalist service was therefore contacted to admit patient for further management  Review of Systems: As mentioned in the history of present illness. All other systems reviewed and are negative. Past Medical History:  Diagnosis Date   Anxiety    Asthma    Bradycardia    CHF (congestive heart failure) (HCC)    Chronic Chest Pain    COPD (chronic obstructive pulmonary disease) (HCC)    Coronary artery disease    a. 2008 s/p PCI to LAD;  b. 2013  Cath: patent stent->Med Rx;  c. 11/2013 MV: EF 67%, no ischemia/infarct.   Depression    Diabetes mellitus without complication (HCC)    GERD (gastroesophageal reflux disease)    Heart murmur    a. 02/2010 Echo: EF 55-60%, no rwma, Gr 2 DD, triv MR, mildly dil LA, nl RV.   Hypercholesteremia    Hypertension    Hypertensive heart disease    Idiopathic pulmonary fibrosis (HCC)    a. Seen by Dr. Dema Severin 01/2015 and Rx Esbriet - pt cannot afford.   Other social stressor    a. wife with mental illness   Past Surgical History:  Procedure Laterality Date   CARDIAC CATHETERIZATION     CORONARY ANGIOPLASTY     stents times 4   ESOPHAGOGASTRODUODENOSCOPY (EGD) WITH PROPOFOL N/A 10/22/2019   Procedure: ESOPHAGOGASTRODUODENOSCOPY (EGD) WITH PROPOFOL;  Surgeon: Pasty Spillers, MD;  Location: [facility];  Service: Endoscopy;  Laterality: N/A;   Esophagus stretch     EYE SURGERY     Bilateral cataracts   LUNG BIOPSY     NASAL ENDOSCOPY N/A 12/21/2016   Procedure: NASAL ENDOSCOPY;  Surgeon: Vernie Murders, MD;  Location: [facility];  Service: ENT;  Laterality: N/A;   TESTICLE SURGERY     VIDEO ASSISTED THORACOSCOPY (VATS)/THOROCOTOMY Right 11/18/2014   Procedure: VIDEO ASSISTED THORACOSCOPY (VATS)/ wedge resection biopsy;  Surgeon: Hulda Marin, MD;  Location: [facility] ORS;  Service: General;  Laterality: Right;   Social History:  reports that he quit smoking about 54 years ago. His smoking use included cigarettes. He has a 0.50 pack-year smoking history. He has  never used smokeless tobacco. He reports that he does not drink alcohol and does not use drugs.  Allergies  Allergen Reactions   Nintedanib Other (See Comments)   Metoprolol Other (See Comments)   Metoprolol Succinate Other (See Comments)    Reaction:  Unknown    Niacin Other (See Comments)    Reaction:  Unknown    Sertraline Hcl Other (See Comments)    Reaction:  Unknown    Sertraline Hcl Other (See Comments)    Family  History  Problem Relation Age of Onset   Coronary artery disease Mother        s/p CABG   Heart failure Mother    Heart disease Other    Depression Other     Prior to Admission medications   Medication Sig Start Date End Date Taking? Authorizing Provider  Accu-Chek Softclix Lancets lancets USE AS DIRECTED EVERY DAY 04/12/19   [provider]  albuterol (PROVENTIL) (2.5 MG/3ML) 0.083% nebulizer solution Take 3 mLs (2.5 mg total) by nebulization every 6 (six) hours as needed. 05/02/12   Antonieta Iba, MD  albuterol (VENTOLIN HFA) 108 (90 Base) MCG/ACT inhaler Inhale 2 puffs into the lungs every 4 (four) hours as needed for wheezing. 01/22/06   [provider]  ALPRAZolam Prudy Feeler) 0.5 MG tablet Take 0.5 mg by mouth every 4 (four) hours as needed. 09/23/20   [provider]  amLODipine (NORVASC) 5 MG tablet Take 1 tablet (5 mg total) by mouth daily. 12/20/21   Antonieta Iba, MD  aspirin EC 81 MG tablet Take 81 mg by mouth every morning. 08/06/12   [provider]  benzonatate (TESSALON) 100 MG capsule Take 1 capsule (100 mg total) by mouth 3 (three) times daily as needed for cough. 07/23/20   Phineas Semen, MD  Blood Glucose Monitoring Suppl (ACCU-CHEK GUIDE ME) w/Device KIT DX E11.9 (MEDICAID PREFERRED) 04/11/19   [provider]  cefdinir (OMNICEF) 300 MG capsule Take 1 capsule (300 mg total) by mouth 2 (two) times daily. Patient not taking: Reported on 12/20/2021 03/15/20   Tyrone Nine, MD  diclofenac Sodium (VOLTAREN) 1 % GEL Apply 1 application topically 4 (four) times daily.  11/07/19   [provider]  docusate sodium (COLACE) 100 MG capsule Take 100 mg by mouth daily. 12/15/21   [provider]  guaiFENesin-codeine 100-10 MG/5ML syrup Take 5 mLs by mouth every 4 (four) hours as needed. 09/28/20   [provider]  HYDROcodone-acetaminophen (NORCO/VICODIN) 5-325 MG tablet Take 1 tablet by mouth every 4 (four) hours as  needed for moderate pain. 02/24/20   [name], Delorise Royals, PA-C  ipratropium-albuterol (DUONEB) 0.5-2.5 (3) MG/3ML SOLN Take 3 mLs by nebulization 3 (three) times daily. 10/10/20   [provider]  JARDIANCE 25 MG TABS tablet Take 25 mg by mouth daily. 10/28/21   [provider]  lactulose (CHRONULAC) 10 GM/15ML solution Take 30 mLs (20 g total) by mouth daily as needed for mild constipation. 12/28/19   Irean Hong, MD  LORazepam (ATIVAN) 0.5 MG tablet Take 0.5 mg by mouth every 4 (four) hours as needed. 09/17/20   [provider]  metFORMIN (GLUCOPHAGE) 1000 MG tablet Take 1,000 mg by mouth 2 (two) times daily with a meal. Patient not taking: Reported on 12/20/2021    [provider]  Multiple Vitamins-Minerals (CENTRUM SILVER PO) Take 1 tablet by mouth daily.  Patient not taking: Reported on 08/11/2021    [provider]  nitroGLYCERIN (NITROSTAT)  0.4 MG SL tablet Place 1 tablet (0.4 mg total) under the tongue every 5 (five) minutes as needed. 07/30/17   Antonieta Iba, MD  oxyCODONE (ROXICODONE INTENSOL) 20 MG/ML concentrated solution SMARTSIG:0.5 Milliliter(s) By Mouth Every 4 Hours PRN 10/06/20   [provider]  pantoprazole (PROTONIX) 40 MG tablet Take 1 tablet (40 mg total) by mouth daily. 11/10/19   Pasty Spillers, MD  predniSONE (DELTASONE) 20 MG tablet Take 1 tablet (20 mg total) by mouth daily with breakfast. 03/15/20   Tyrone Nine, MD  predniSONE (STERAPRED UNI-PAK 21 TAB) 10 MG (21) TBPK tablet Per packaging instructions 07/23/20   Phineas Semen, MD  pregabalin (LYRICA) 50 MG capsule Take 100 mg by mouth 2 (two) times daily.  08/13/19   [provider]  QUEtiapine (SEROQUEL) 25 MG tablet Take 25 mg by mouth at bedtime. 10/25/21   [provider]  rosuvastatin (CRESTOR) 10 MG tablet Take 1 tablet (10 mg total) by mouth daily. Take 1 tablet (10 mg) by mouth once daily. 04/06/22   Antonieta Iba, MD    Physical  Exam: Vitals:   10/24/22 1514 10/24/22 1535 10/24/22 1600 10/24/22 1630  BP:   (!) 141/82 (!) 144/94  Pulse:  (!) 105 (!) 108 (!) 111  Resp:  (!) 36 (!) 46 (!) 36  Temp:      TempSrc:      SpO2:  95% 96% 91%  Weight: 64 kg     Height: 5\' 9"  (1.753 m)      Patient seen and examined in some distress on BiPAP Respiratory: entry significantly decreased on the right side CVS: heart sounds 1 and 2 present Musculoskeletal: No edema Abdomen: Moves with respiration no tenderness elicited CNS: Alert and oriented x 3   Data Reviewed: I have reviewed patient's laboratory results showing sodium 138 potassium 4.6 creatinine 0.9 WBC 11.7 hemoglobin 15.0  Assessment and Plan:  Acute on chronic chronic hypoxic respiratory failure secondary to large right-sided pleural effusion Community-acquired pneumonia with possible parapneumonic effusion Patient uses 3 L of home oxygen IR guided thoracentesis requested Chest x-ray showed findings of large right-sided pleural effusion as well as bilateral infiltrate Follow-up on viral panel We will cover with ceftriaxone and azithromycin Monitor saturation closely Continue BiPAP, and wean off as tolerated Pulmonologist Dr. Aundria Rud consulted We will keep n.p.o. while on BiPAP, and at least n.p.o. after midnight for planned thoracentesis hopefully tomorrow  Essential hypertension with hypertensive heart disease Resumed home medications  Severe pulmonary fibrosis Patient follows up with pulmonologist Currently on oral prednisone we will continue  COPD Currently no wheezing or signs of acute exacerbation Continue current BiPAP We will keep on as needed nebulization of BiPAP   Coronary artery disease status post stent placement Continue statin and aspirin therapy  Diabetes mellitus with neuropathy Placed on sliding scale Continue to monitor glucose level  GERD Continue PPI therapy  Hyperlipidemia Continue statin therapy   DVT  prophylaxis-continue Lovenox    Advance Care Planning:   Code Status: Prior DNR I discussed this with patient and at this point he remains DNI DNR  Family Communication: Discussed with patient's son over the phone  Severity of Illness: The appropriate patient status for this patient is INPATIENT. Inpatient status is judged to be reasonable and necessary in order to provide the required intensity of service to ensure the patient's safety. The patient's presenting symptoms, physical exam findings, and initial radiographic and laboratory data in the context of their chronic  comorbidities is felt to place them at high risk for further clinical deterioration. Furthermore, it is not anticipated that the patient will be medically stable for discharge from the hospital within 2 midnights of admission.   * I certify that at the point of admission it is my clinical judgment that the patient will require inpatient hospital care spanning beyond 2 midnights from the point of admission due to high intensity of service, high risk for further deterioration and high frequency of surveillance required.*  Author: Loyce Dys, MD 10/24/2022 5:43 PM  For on call review [email or web address].

## 2022-10-24 NOTE — Consult Note (Signed)
NAME:  Shane Feik., MRN:  [id number], DOB:  14-Apr-1946, LOS: 0 ADMISSION DATE:  10/24/2022, CONSULTATION DATE:  10/24/22 REFERRING MD:  Dr. Meriam Sprague, CHIEF COMPLAINT:  Shortness of Breath  History of Present Illness:  77 yo M presenting to [facility] ED from home on 10/24/2022 for evaluation of gradual worsening of baseline shortness of breath over the last month.  History obtained per chart review and patient bedside report. Patient reported his shortness of breath which is poor at baseline worsened significantly over the last week and became more intense within the last few days.  He reported becoming extremely winded with minimal exertion as well as chest heaviness. He is on chronic 3 L nasal cannula.  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 His home health nurse documented that his SpO2 was consistently in the 70's with minimal exertion at home. He had also been prescribed Benzonatate by PCP but it is too expensive for him to obtain.  Dyspnea was associated with a dry cough.  Patient denied fever, chest pain, abdominal pain/ nausea/ vomiting/ diarrhea. EMS was called out for respiratory distress, reporting the patient to be severely hypoxic at 30% on room air.  He was placed on a nonrebreather with SpO2 of 70%, therefore transition to CPAP support.  He received Solu-Medrol & magnesium.  ED course: Upon arrival patient alert and oriented, tachypneic and tachycardic with significant work of breathing and was transitioned to BiPAP support.  Imaging notable for large right-sided pleural effusion as well as possible infection in the setting of severe ILD.  Labs significant for hyperglycemia, very mild leukocytosis and PCT 0.11.  TRH consulted for admission due to acute hypoxic respiratory and large right-sided pleural effusion needing thoracentesis  medications given: Lasix, DuoNebs,  azithromycin and ceftriaxone Initial Vitals: 36, 111, 144/94 and 91% on BiPAP 50% Significant labs: (Labs/ Imaging personally reviewed) I, Cheryll Cockayne Rust-Chester, AGACNP-BC, personally viewed and interpreted this ECG. EKG Interpretation: Date: 10/16/2022, EKG Time: 15:13, Rate: 108, Rhythm: ST, QRS Axis: RAD Intervals: Normal, ST/T Wave abnormalities: None, Narrative Interpretation: ST with RAD (challenging interpretation due to artifact) Chemistry: Na+: 130, K+: 4.6, BUN/Cr.:  21/0.90, Serum CO2/ AG: 29/11 Hematology: WBC: 11.7, Hgb: 15,  PCT: 0.11, COVID-19 & Influenza A/B: Negative VBG: 7.42/47/53/30.5 CXR 10/24/2022: Markedly decreased right lung volume due to large right pleural effusion.  Increased bilateral patchy opacities superimposed on reticulations which may represent pulmonary edema or infection on a background of ILD.  Pulmonology consult for assistance in management and monitoring due to acute hypoxic respiratory failure secondary to large right pleural effusion and possible CAP on BiPAP support.  Pertinent  Medical History  Severe pulmonary fibrosis CAD status post PCI x 4 COPD Diabetes mellitus with neuropathy Hypertension Hyperlipidemia GERD [facility] Significant Hospital Events: Including procedures, antibiotic start and stop dates in addition to other pertinent events   10/24/2022: Admit to PCU due to acute hypoxic respiratory failure secondary to large right pleural effusion and possible CAP on BiPAP support.  PCCM consult  Interim History / Subjective:  Patient alert and oriented on BiPAP, stable vitals including SpO2 > 96%.  -BiPAP support weaned successfully to heated high flow nasal cannula -Bedside lung POCUS performed revealing very large right-sided pleural effusion -Consulted with Dr. Aundria Rud, will perform urgent bedside chest tube placement overnight.  Objective   Blood pressure 101/70, pulse 97, temperature 97.7 F (36.5 C), temperature  source Axillary, resp.  rate 19, height 5\' 9"  (1.753 m), weight 64 kg, SpO2 98 %.    FiO2 (%):  [50 %-60 %] 60 %   Intake/Output Summary (Last 24 hours) at 10/24/2022 2348 Last data filed at 10/24/2022 2254 Gross per 24 hour  Intake 350 ml  Output 2005 ml  Net -1655 ml   Filed Weights   10/24/22 1514  Weight: 64 kg    Examination: General: Adult male, acutely ill, lying in bed, dyspneic but NAD HEENT: MM pink/moist, anicteric, atraumatic, neck supple Neuro: A&O x 4, able to follow commands, PERRL +3, MAE CV: s1s2 RRR,  on monitor, no r/m/g Pulm: Regular, dyspneic mildly labored on HHFNC 60%/50L, breath sounds expiratory and inspiratory wheezes-LUL & diminished-LLL /diminished lung sounds with some crackles on entire right side GI: soft, rounded, non tender, bs x 4 GU: Voiding in urinal with clear yellow urine Skin:  no rashes/lesions noted Extremities: warm/dry, pulses + 2 R/P, no edema noted  Resolved Hospital Problem list     Assessment & Plan:  Acute on chronic Hypoxic Respiratory Failure secondary to large right-sided pleural effusion Possible CAP  Severe Pulmonary Fibrosis Bedside Lung POCUS revealed large Right-sided pleural effusion. Bedside pigtail CT placement completed under [place] without complication.  - Pleural fluid sent for analysis - Initially CT ordered to water seal >> f/u CXR revealed moderate apical PTX, discussed with pulmonologist and CT changed to suction -20 >> f/u CXR showed CT possibly kinked with continued apical PTX. Discussed further with pulmonologist-Bedside adjustments made to dressing, alleviating kinking, suction maintained. F/u CT chest in AM - Clamp CT if patient experiences pain or output >/= 1.5 L - Continue HHFNC overnight, wean FiO2 as tolerated > BIPAP PRN - Supplemental O2 to maintain SpO2 > 88% - Intermittent chest x-ray & ABG PRN - Ensure adequate pulmonary hygiene  - F/u cultures, trend PCT - Continue CAP coverage: ceftriaxone & azithromycin -  bronchodilators PRN, outpatient prednisone  Best Practice (right click and "Reselect all SmartList Selections" daily)  Diet/type: NPO w/ oral meds DVT prophylaxis: LMWH GI prophylaxis: PPI Lines: N/A Foley:  N/A Code Status:  DNR Last date of multidisciplinary goals of care discussion [per primary]  Labs   CBC: Recent Labs  Lab 10/24/22 1527  WBC 11.7*  NEUTROABS 11.0*  HGB 15.0  HCT 47.6  MCV 89.3  PLT 311    Basic Metabolic Panel: Recent Labs  Lab 10/24/22 1527  NA 138  K 4.6  CL 98  CO2 29  GLUCOSE 263*  BUN 21  CREATININE 0.90  CALCIUM 9.1   GFR: Estimated Creatinine Clearance: 62.2 mL/min (by C-G formula based on SCr of 0.9 mg/dL). Recent Labs  Lab 10/24/22 1527  PROCALCITON 0.11  WBC 11.7*    Liver Function Tests: No results for input(s): "AST", "ALT", "ALKPHOS", "BILITOT", "PROT", "ALBUMIN" in the last 168 hours. No results for input(s): "LIPASE", "AMYLASE" in the last 168 hours. No results for input(s): "AMMONIA" in the last 168 hours.  ABG    Component Value Date/Time   PHART 7.45 09/04/2019 1425   PCO2ART 39 09/04/2019 1425   PO2ART 79 (L) 09/04/2019 1425   HCO3 30.5 (H) 10/24/2022 1525   TCO2 24 06/30/2011 1351   ACIDBASEDEF 0.1 08/05/2019 1500   O2SAT 87.3 10/24/2022 1525     Coagulation Profile: No results for input(s): "INR", "PROTIME" in the last 168 hours.  Cardiac Enzymes: No results for input(s): "CKTOTAL", "CKMB", "CKMBINDEX", "TROPONINI" in the last 168 hours.  HbA1C: Hgb A1c MFr Bld  Date/Time Value Ref Range Status  03/15/2020 03:29 AM 6.7 (H) 4.8 - 5.6 % Final    Comment:    (NOTE)         Prediabetes: 5.7 - 6.4         Diabetes: >6.4         Glycemic control for adults with diabetes: <7.0   10/02/2017 08:13 AM 6.4 (H) 4.8 - 5.6 % Final    Comment:             Prediabetes: 5.7 - 6.4          Diabetes: >6.4          Glycemic control for adults with diabetes: <7.0     CBG: Recent Labs  Lab 10/24/22 1854  10/24/22 2109  GLUCAP 218* 159*    Review of Systems: positives in BOLD  Gen: Denies fever, chills, weight change, fatigue, night sweats HEENT: Denies blurred vision, double vision, hearing loss, tinnitus, sinus congestion, rhinorrhea, sore throat, neck stiffness, dysphagia PULM: Denies shortness of breath, cough, sputum production, hemoptysis, wheezing CV: Denies chest pain, edema, orthopnea, paroxysmal nocturnal dyspnea, palpitations GI: Denies abdominal pain, nausea, vomiting, diarrhea, hematochezia, melena, constipation, change in bowel habits GU: Denies dysuria, hematuria, polyuria, oliguria, urethral discharge Endocrine: Denies hot or cold intolerance, polyuria, polyphagia or appetite change Derm: Denies rash, dry skin, scaling or peeling skin change Heme: Denies easy bruising, bleeding, bleeding gums Neuro: Denies headache, numbness, weakness, slurred speech, loss of memory or consciousness  Past Medical History:  He,  has a past medical history of Anxiety, Asthma, Bradycardia, CHF (congestive heart failure) (HCC), Chronic Chest Pain, COPD (chronic obstructive pulmonary disease) (HCC), Coronary artery disease, Depression, Diabetes mellitus without complication (HCC), GERD (gastroesophageal reflux disease), Heart murmur, Hypercholesteremia, Hypertension, Hypertensive heart disease, Idiopathic pulmonary fibrosis (HCC), and Other social stressor.   Surgical History:   Past Surgical History:  Procedure Laterality Date   CARDIAC CATHETERIZATION     CORONARY ANGIOPLASTY     stents times 4   ESOPHAGOGASTRODUODENOSCOPY (EGD) WITH PROPOFOL N/A 10/22/2019   Procedure: ESOPHAGOGASTRODUODENOSCOPY (EGD) WITH PROPOFOL;  Surgeon: Pasty Spillers, MD;  Location: [facility] END[facility]COPY;  Service: Endoscopy;  Laterality: N/A;   Esophagus stretch     EYE SURGERY     Bilateral cataracts   LUNG BIOPSY     NASAL ENDOSCOPY N/A 12/21/2016   Procedure: NASAL ENDOSCOPY;  Surgeon: Vernie Murders, MD;   Location: [facility];  Service: ENT;  Laterality: N/A;   TESTICLE SURGERY     VIDEO ASSISTED THORACOSCOPY (VATS)/THOROCOTOMY Right 11/18/2014   Procedure: VIDEO ASSISTED THORACOSCOPY (VATS)/ wedge resection biopsy;  Surgeon: Hulda Marin, MD;  Location: [facility] ORS;  Service: General;  Laterality: Right;     Social History:   reports that he quit smoking about 54 years ago. His smoking use included cigarettes. He has a 0.50 pack-year smoking history. He has never used smokeless tobacco. He reports that he does not drink alcohol and does not use drugs.   Family History:  His family history includes Coronary artery disease in his mother; Depression in an other family member; Heart disease in an other family member; Heart failure in his mother.   Allergies Allergies  Allergen Reactions   Nintedanib Other (See Comments)   Metoprolol Other (See Comments)   Metoprolol Succinate Other (See Comments)    Reaction:  Unknown    Niacin Other (See Comments)    Reaction:  Unknown    Sertraline  Hcl Other (See Comments)    Reaction:  Unknown    Sertraline Hcl Other (See Comments)     Home Medications  Prior to Admission medications   Medication Sig Start Date End Date Taking? Authorizing Provider  acetaminophen (TYLENOL) 325 MG tablet 325 MG AS NEEDED ORALLY EVERY 6 HOURS AS NEEDED FOR PAIN/FEVER 30 DAYS   Yes [provider]  albuterol (PROVENTIL) (2.5 MG/3ML) 0.083% nebulizer solution Take 3 mLs (2.5 mg total) by nebulization every 6 (six) hours as needed. 05/02/12  Yes Gollan, Tollie Pizza, MD  albuterol (VENTOLIN HFA) 108 (90 Base) MCG/ACT inhaler Inhale 2 puffs into the lungs every 4 (four) hours as needed for wheezing. 01/22/06  Yes [provider]  ALPRAZolam (XANAX) 0.5 MG tablet Take 0.5 mg by mouth every 8 (eight) hours as needed. 09/04/22  Yes [provider]  amLODipine (NORVASC) 5 MG tablet Take 1 tablet (5 mg total) by mouth daily. 12/20/21  Yes Antonieta Iba, MD  aspirin EC 81 MG tablet Take 81 mg by mouth every morning. 08/06/12  Yes [provider]  docusate sodium (COLACE) 100 MG capsule Take 100 mg by mouth daily. 12/15/21  Yes [provider]  JARDIANCE 25 MG TABS tablet Take 25 mg by mouth daily. 10/28/21  Yes [provider]  LEVEMIR FLEXPEN 100 UNIT/ML FlexPen 20u AM 15u bedtime Subcutaneous twice a day for 30 days 09/12/22  Yes [provider]  nitroGLYCERIN (NITROSTAT) 0.4 MG SL tablet Place 1 tablet (0.4 mg total) under the tongue every 5 (five) minutes as needed. 07/30/17  Yes Gollan, Tollie Pizza, MD  Oxycodone HCl 10 MG TABS Take 10 mg by mouth every 6 (six) hours. 10/05/22 10/17/23 Yes [provider]  pioglitazone (ACTOS) 45 MG tablet Take 45 mg by mouth daily.   Yes [provider]  predniSONE (DELTASONE) 20 MG tablet Take 1 tablet (20 mg total) by mouth daily with breakfast. 03/15/20  Yes Tyrone Nine, MD  pregabalin (LYRICA) 50 MG capsule Take 100 mg by mouth 2 (two) times daily.  08/13/19  Yes [provider]  QUEtiapine (SEROQUEL) 25 MG tablet Take 25 mg by mouth at bedtime. 10/25/21  Yes [provider]  rosuvastatin (CRESTOR) 10 MG tablet Take 1 tablet (10 mg total) by mouth daily. Take 1 tablet (10 mg) by mouth once daily. 04/06/22  Yes Antonieta Iba, MD  torsemide (DEMADEX) 20 MG tablet as needed Orally daily for 30 days 10/05/22  Yes [provider]  Accu-Chek Softclix Lancets lancets USE AS DIRECTED EVERY DAY 04/12/19   [provider]  benzonatate (TESSALON) 100 MG capsule Take 1 capsule (100 mg total) by mouth 3 (three) times daily as needed for cough. Patient not taking: Reported on 10/24/2022 07/23/20   Phineas Semen, MD  Blood Glucose Monitoring Suppl (ACCU-CHEK GUIDE ME) w/Device KIT DX E11.9 (MEDICAID PREFERRED) 04/11/19   [provider]  cefdinir (OMNICEF) 300 MG capsule Take 1 capsule (300 mg total) by mouth 2 (two) times  daily. Patient not taking: Reported on 12/20/2021 03/15/20   Tyrone Nine, MD  diclofenac Sodium (VOLTAREN) 1 % GEL Apply 1 application topically 4 (four) times daily.  Patient not taking: Reported on 10/24/2022 11/07/19   [provider]  guaiFENesin-codeine 100-10 MG/5ML syrup Take 5 mLs by mouth every 4 (four) hours as needed. Patient not taking: Reported on 10/24/2022 09/28/20   [provider]  HYDROcodone-acetaminophen (NORCO/VICODIN) 5-325 MG tablet Take 1 tablet by  mouth every 4 (four) hours as needed for moderate pain. Patient not taking: Reported on 10/24/2022 02/24/20   [name], Delorise Royals, PA-C  ipratropium-albuterol (DUONEB) 0.5-2.5 (3) MG/3ML SOLN Take 3 mLs by nebulization 3 (three) times daily. Patient not taking: Reported on 10/24/2022 10/10/20   [provider]  lactulose (CHRONULAC) 10 GM/15ML solution Take 30 mLs (20 g total) by mouth daily as needed for mild constipation. Patient not taking: Reported on 10/24/2022 12/28/19   Irean Hong, MD  LORazepam (ATIVAN) 0.5 MG tablet Take 0.5 mg by mouth every 4 (four) hours as needed. Patient not taking: Reported on 10/24/2022 09/17/20   [provider]  metFORMIN (GLUCOPHAGE) 1000 MG tablet Take 1,000 mg by mouth 2 (two) times daily with a meal. Patient not taking: Reported on 12/20/2021    [provider]  Multiple Vitamins-Minerals (CENTRUM SILVER PO) Take 1 tablet by mouth daily.  Patient not taking: Reported on 08/11/2021    [provider]  oxyCODONE (ROXICODONE INTENSOL) 20 MG/ML concentrated solution SMARTSIG:0.5 Milliliter(s) By Mouth Every 4 Hours PRN Patient not taking: Reported on 10/24/2022 10/06/20   [provider]  pantoprazole (PROTONIX) 40 MG tablet Take 1 tablet (40 mg total) by mouth daily. Patient not taking: Reported on 10/24/2022 11/10/19   Pasty Spillers, MD  predniSONE (STERAPRED UNI-PAK 21 TAB) 10 MG (21) TBPK tablet Per packaging instructions Patient not  taking: Reported on 10/24/2022 07/23/20   Phineas Semen, MD     Critical care time: 65 minutes       Betsey Holiday, AGACNP-BC Acute Care Nurse Practitioner [facility]   [phone] / [phone] Please see Amion for details.

## 2022-10-24 NOTE — ED Notes (Signed)
MD and NP at bedside with float RN to insert chest tube at this time

## 2022-10-24 NOTE — ED Notes (Signed)
Per Provider, clamp chest tube immediately should patient have >1.5L of output from chest tube in the first 24 hours.

## 2022-10-24 NOTE — Procedures (Signed)
Insertion of Chest Tube Procedure Note  Shane Reid  [id number]  Jul 16, 1945  Date:10/24/22  Time:10:47 PM    Provider Performing: Cecelia Byars Rust-Chester   Procedure: Pleural Catheter Insertion w/ Imaging Guidance ([id number])  Indication(s) Effusion  Consent Risks of the procedure as well as the alternatives and risks of each were explained to the patient and/or caregiver.  Consent for the procedure was obtained and is signed in the bedside chart  Anesthesia Topical only with 1% lidocaine    Time Out Verified patient identification, verified procedure, site/side was marked, verified correct patient position, special equipment/implants available, medications/allergies/relevant history reviewed, required imaging and test results available.   Sterile Technique Maximal sterile technique including full sterile barrier drape, hand hygiene, sterile gown, sterile gloves, mask, hair covering, sterile ultrasound probe cover (if used).   Procedure Description Ultrasound used to identify appropriate pleural anatomy for placement and overlying skin marked. Area of placement cleaned and draped in sterile fashion.  A 14 French pigtail pleural catheter was placed into the right pleural space using Seldinger technique. Appropriate return of fluid was obtained.  The tube was connected to atrium and placed on -20 cm H2O wall suction.   Complications/Tolerance None; patient tolerated the procedure well. Chest X-ray is ordered to verify placement.   EBL Minimal  Specimen(s) fluid   Betsey Holiday, AGACNP-BC Acute Care Nurse Practitioner  [facility]   [phone] / [phone] Please see Amion for details.

## 2022-10-24 NOTE — ED Notes (Signed)
Respiratory at bedside.

## 2022-10-24 NOTE — ED Notes (Signed)
Respiratory aware of VBG

## 2022-10-24 NOTE — ED Provider Notes (Signed)
[facility] Provider Note    Event Date/Time   First MD Initiated Contact with Patient 10/24/22 1511     (approximate)   History   Told to come in by nurse for antibiotics.    HPI  Shane Reid. is a 77 y.o. male  with history of pulmonary fibrosis who presents to the emergency department today at the advice of home health nurse for antibiotics.  He says that his breathing, which is poor at baseline, has been worse over the past week. He gets extremely winded with minimal exertion. The patient has had some chest heaviness. Denies any fevers. Has had pneumonias in the past. Was placed on CPAP by EMS.    Physical Exam   Triage Vital Signs: ED Triage Vitals  Enc Vitals Group     BP 10/24/22 1513 122/72     Pulse Rate 10/24/22 1513 (!) 107     Resp 10/24/22 1513 19     Temp --      Temp src --      SpO2 10/24/22 1509 (!) 82 %     Weight 10/24/22 1514 141 lb 1.5 oz (64 kg)     Height 10/24/22 1514 5\' 9"  (1.753 m)     Head Circumference --      Peak Flow --      Pain Score 10/24/22 1513 0     Pain Loc --      Pain Edu? --      Excl. in GC? --     Most recent vital signs: Vitals:   10/24/22 1512 10/24/22 1513  BP:  122/72  Pulse:  (!) 107  Resp:  19  SpO2: (!) 82% 92%   General: Awake, alert, oriented. CV:  Good peripheral perfusion. Regular rate and rhythm. Resp:  Increased respiratory effort. Diffuse expiratory wheezing. Abd:  No distention.   ED Results / Procedures / Treatments   Labs (all labs ordered are listed, but only abnormal results are displayed) Labs Reviewed  BLOOD GAS, VENOUS - Abnormal; Notable for the following components:      Result Value   pO2, Ven 53 (*)    Bicarbonate 30.5 (*)    Acid-Base Excess 5.1 (*)    All other components within normal limits  BASIC METABOLIC PANEL - Abnormal; Notable for the following components:   Glucose, Bld 263 (*)    All other components within normal limits  CBC WITH  DIFFERENTIAL/PLATELET - Abnormal; Notable for the following components:   WBC 11.7 (*)    Neutro Abs 11.0 (*)    Lymphs Abs 0.2 (*)    All other components within normal limits  RESP PANEL BY RT-PCR (RSV, FLU A&B, COVID)  [id number]     EKG  I, Phineas Semen, attending physician, personally viewed and interpreted this EKG  EKG Time: 1513 Rate: 108 Rhythm: sinus tachycardia Axis: normal Intervals: qtc 432 QRS: narrow, q waves v1, v2 ST changes: no st elevation Impression: abnormal ekg   RADIOLOGY I independently interpreted and visualized the CXR. My interpretation: Right sided pleural effusion Radiology interpretation:  IMPRESSION:  1. Markedly decreased right lung volume due to the large right  pleural effusion.  2. Increased bilateral patchy opacities superimposed on  reticulations, which may represent pulmonary edema or infection on a  background of interstitial lung disease.      PROCEDURES:  Critical Care performed: Yes  CRITICAL CARE Performed by: Phineas Semen   Total critical care time:  40 minutes  Critical care time was exclusive of separately billable procedures and treating other patients.  Critical care was necessary to treat or prevent imminent or life-threatening deterioration.  Critical care was time spent personally by me on the following activities: development of treatment plan with patient and/or surrogate as well as nursing, discussions with consultants, evaluation of patient's response to treatment, examination of patient, obtaining history from patient or surrogate, ordering and performing treatments and interventions, ordering and review of laboratory studies, ordering and review of radiographic studies, pulse oximetry and re-evaluation of patient's condition.   Procedures    MEDICATIONS ORDERED IN ED: Medications - No data to display   IMPRESSION / MDM / ASSESSMENT AND PLAN / ED COURSE  I reviewed the triage vital signs and the  nursing notes.                              Differential diagnosis includes, but is not limited to, pneumonia, COVID, worsening pulmonary fibrosis  Patient's presentation is most consistent with acute presentation with potential threat to life or bodily function.   The patient is on the cardiac monitor to evaluate for evidence of arrhythmia and/or significant heart rate changes.  Patient presented to the emergency department today via EMS for breathing difficulty.  Upon arrival patient was switched to BiPAP.  He did have diffuse expiratory wheezing.  Did appear to be in respiratory distress.  Blood work and chest x-ray were ordered.  Blood work showed very mild leukocytosis and procalcitonin of 0.11.  Chest x-ray was notable for large right-sided pleural effusion.  Radiology was also concern for possible infection.  Will start IV antibiotics although I have lower concern for infection causing the patient's increased respiratory difficulty, I do fact think it is more likely the pleural effusion.  Will give patient Lasix but did discuss with patient possible need for thoracentesis.  Did confirm with patient that he is DNR and he also stated he would not want to be intubated. Discussed with Dr. Meriam Sprague with the hospitalist service who will plan on admission.    FINAL CLINICAL IMPRESSION(S) / ED DIAGNOSES   Final diagnoses:  SOB (shortness of breath)  Hypoxia  Pleural effusion    Note:  This document was prepared using Dragon voice recognition software and may include unintentional dictation errors.    Phineas Semen, MD 10/24/22 [phone]

## 2022-10-24 NOTE — ED Triage Notes (Signed)
Pt presents to the ED with [facility] from home. EMS was called out for respiratory distress. Upon arrival FD stated that his O2 saturation was 30% on RA. They placed pt on non-rebreather with a O2 saturation of 70%. EMS placed pt on cpap with a max O2 saturation of 82% on cpap. Pt was given 125mg  solumedrol and 2 grams of mag with EMS. Pt has a hx of pulmonary fibrosis. Pt A&Ox4. Pt states that his son Casimiro Needle is his HPOA.

## 2022-10-25 ENCOUNTER — Other Ambulatory Visit: Payer: Self-pay

## 2022-10-25 ENCOUNTER — Inpatient Hospital Stay: Payer: 59

## 2022-10-25 ENCOUNTER — Encounter: Payer: Self-pay | Admitting: Internal Medicine

## 2022-10-25 DIAGNOSIS — J84112 Idiopathic pulmonary fibrosis: Secondary | ICD-10-CM

## 2022-10-25 DIAGNOSIS — I10 Essential (primary) hypertension: Secondary | ICD-10-CM

## 2022-10-25 DIAGNOSIS — E782 Mixed hyperlipidemia: Secondary | ICD-10-CM

## 2022-10-25 DIAGNOSIS — I25118 Atherosclerotic heart disease of native coronary artery with other forms of angina pectoris: Secondary | ICD-10-CM

## 2022-10-25 DIAGNOSIS — J9621 Acute and chronic respiratory failure with hypoxia: Secondary | ICD-10-CM | POA: Diagnosis not present

## 2022-10-25 DIAGNOSIS — J9601 Acute respiratory failure with hypoxia: Secondary | ICD-10-CM | POA: Diagnosis not present

## 2022-10-25 DIAGNOSIS — J90 Pleural effusion, not elsewhere classified: Secondary | ICD-10-CM

## 2022-10-25 DIAGNOSIS — E119 Type 2 diabetes mellitus without complications: Secondary | ICD-10-CM

## 2022-10-25 LAB — BASIC METABOLIC PANEL
Anion gap: 13 (ref 5–15)
BUN: 34 mg/dL — ABNORMAL HIGH (ref 8–23)
CO2: 31 mmol/L (ref 22–32)
Calcium: 9.3 mg/dL (ref 8.9–10.3)
Chloride: 99 mmol/L (ref 98–111)
Creatinine, Ser: 1.03 mg/dL (ref 0.61–1.24)
GFR, Estimated: >60 mL/min (ref >60)
Glucose, Bld: 176 mg/dL — ABNORMAL HIGH (ref 70–99)
Potassium: 5.4 mmol/L — ABNORMAL HIGH (ref 3.5–5.1)
Sodium: 143 mmol/L (ref 135–145)

## 2022-10-25 LAB — BODY FLUID CELL COUNT WITH DIFFERENTIAL
Eos, Fluid: 0 %
Lymphs, Fluid: 80 %
Monocyte-Macrophage-Serous Fluid: 18 %
Neutrophil Count, Fluid: 2 %
Total Nucleated Cell Count, Fluid: 305 cu mm

## 2022-10-25 LAB — RESPIRATORY PANEL BY PCR

## 2022-10-25 LAB — CBC WITH DIFFERENTIAL/PLATELET
Abs Immature Granulocytes: 0.06 10*3/uL (ref 0.00–0.07)
Basophils Absolute: 0 10*3/uL (ref 0.0–0.1)
Basophils Relative: 0 %
Eosinophils Absolute: 0 10*3/uL (ref 0.0–0.5)
Eosinophils Relative: 0 %
HCT: 46.3 % (ref 39.0–52.0)
Hemoglobin: 14.5 g/dL (ref 13.0–17.0)
Immature Granulocytes: 1 %
Lymphocytes Relative: 2 %
Lymphs Abs: 0.2 10*3/uL — ABNORMAL LOW (ref 0.7–4.0)
MCH: 28.4 pg (ref 26.0–34.0)
MCHC: 31.3 g/dL (ref 30.0–36.0)
MCV: 90.6 fL (ref 80.0–100.0)
Monocytes Absolute: 0.5 10*3/uL (ref 0.1–1.0)
Monocytes Relative: 5 %
Neutro Abs: 9.5 10*3/uL — ABNORMAL HIGH (ref 1.7–7.7)
Neutrophils Relative %: 92 %
Platelets: 247 10*3/uL (ref 150–400)
RBC: 5.11 MIL/uL (ref 4.22–5.81)
RDW: 15 % (ref 11.5–15.5)
WBC: 10.3 10*3/uL (ref 4.0–10.5)
nRBC: 0 % (ref 0.0–0.2)

## 2022-10-25 LAB — LACTATE DEHYDROGENASE: LDH: 247 U/L — ABNORMAL HIGH (ref 98–192)

## 2022-10-25 LAB — CBG MONITORING, ED
Glucose-Capillary: 238 mg/dL — ABNORMAL HIGH (ref 70–99)
Glucose-Capillary: 130 mg/dL — ABNORMAL HIGH (ref 70–99)
Glucose-Capillary: 308 mg/dL — ABNORMAL HIGH (ref 70–99)
Glucose-Capillary: 108 mg/dL — ABNORMAL HIGH (ref 70–99)

## 2022-10-25 LAB — PROTEIN, TOTAL: Total Protein: 6.9 g/dL (ref 6.5–8.1)

## 2022-10-25 LAB — BODY FLUID CULTURE W GRAM STAIN

## 2022-10-25 NOTE — IPAL (Signed)
  Interdisciplinary Goals of Care Family Meeting   Date carried out: 10/25/2022  Location of the meeting: Bedside  Member's involved: Physician and Patient  Durable Power of Attorney or acting medical decision maker: Patient, Roberth Kumari.    Discussion: We discussed goals of care for [employer].. I discussed with Mr. Aronow that his lung is not re-expanding and that his effusion will quickly recur. I also explained to him that with drainage of the pleural fluid, he will always have a pneumothorax. He wishes to go home, and would like to prioritize that. He understands the end stage nature of his disease, and clearly expressed to me that he wishes to pass away at home. I have explained to Mr. Ulrich that his options include removal of the chest tube, with fluid re-accumulation and ensuing respiratory failure and possible death vs placement of a pleurX catheter (with risk of bleeding and catheter infection) vs being on comfort measures as he is. He prefers to have a catheter that would allow mobility and for him to be at home. He understands the risks associated with having an indwelling pleural catheter, including risk of bleed, risk of infection, and risk of pain. Hospice had been following and will be able to follow along after discharge.  Code status:   Code Status: DNR   Disposition: Placement of an indwelling pleural catheter and discharge home with hospice. Patient has clearly stated his wishes of being DNR/DNI. I encouraged him to have a do not hospitalize order in place.  Time spent for the meeting: 30 mintues    [name], MD  10/25/2022, 2:50 PM

## 2022-10-25 NOTE — ED Notes (Signed)
Chest tube clamped at this time, per Cheryll Cockayne NP, once chest tube drains clamp chest tube. Chest tube removed from suction and tubing clamped. NP aware. Drainage at 

## 2022-10-25 NOTE — Assessment & Plan Note (Signed)
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 -Continue bronchodilators and BiPAP at night

## 2022-10-25 NOTE — Progress Notes (Signed)
[employer], [employer] [facility]) Hospital Liaison Note  Received request from Charlynn Court, LCSW , Transitions of Care Manager, for hospice services at home after discharge.  Spoke with patient to initiate education related to hospice philosophy, services, and team approach to care.  Patient verbalized understanding of information given.  Per discussion, the plan is for discharge home by son's private vehicle in the next couple of days.    DME needs discussed.  Patient has the following equipment in the home: 02 and a nebulizer Patient requests the following equipment in the home:  shower chair   Please send signed and completed DNR home with the patient/family.  Please provide prescriptions at discharge as needed to ensure ongoing symptom management.   [facility] information and contact numbers given to the Patient Above information shared with Charlynn Court, LCSW, Transitions of Care Manager.     Please call with any Hospice related questions or concerns.  Thank you for the opportunity to participate in this patient's care.  Redge Gainer, [facility] Liaison [phone]

## 2022-10-25 NOTE — Assessment & Plan Note (Signed)
Large right-sided pleural effusion.  Collapsed lung with hydropneumothorax. Patient uses 3 L of oxygen at baseline, currently on 8 L S/p chest tube placement by pulmonary-preliminary cultures negative. Concern of trapped lung with chronic pneumothorax with his end-stage underlying pulmonary fibrosis, resulted in pleural thickening and effusion.  Lung failed to expand with chest tube. Pulmonary is recommending home with hospice. Patient will decide about going home with chest tube versus Pleurx catheter as he will be high risk for recurrent effusions. -Pulmonary is on board

## 2022-10-25 NOTE — ED Notes (Signed)
Chest tube clamped per Dr. Marjie Skiff verbal order

## 2022-10-25 NOTE — ED Notes (Signed)
Pt given water and warm blanket at this time 

## 2022-10-25 NOTE — ED Notes (Signed)
Per Dr. Aundria Rud

## 2022-10-25 NOTE — ED Notes (Signed)
Chest tube unclamped by Dr. Aundria Rud; having discussion with patient regarding hospice care

## 2022-10-25 NOTE — ED Notes (Addendum)
Dr. Aundria Rud at bedside; gave verbal order to have chest tube remain clamped and he decreased O2 to 30L at 40%; if patient's O2 sat remains above 90% for 30 minutes then place patient on high flow.

## 2022-10-25 NOTE — ED Notes (Signed)
Patient placed on 5L [place] and weaned off 8L high flow per Dr. Doreene Adas verbal order.

## 2022-10-25 NOTE — Progress Notes (Signed)
Progress Note   Patient: Shane Reid. WUJ:[id number] DOB: 08/12/1945 DOA: 10/24/2022     1 DOS: the patient was seen and examined on 10/25/2022   Brief hospital course: Taken from prior notes.   Shane Reid. is a 77 y.o. male with medical history significant of severe pulmonary fibrosis follows up with a pulmonologist, coronary artery disease status post 4 stent placement, COPD, diabetes mellitus with neuropathy, GERD, hyperlipidemia, hypertension, hypertensive heart disease who has been having gradual worsening of baseline shortness of breath for the last 1 month, due to continuous worsening shortness of breath EMS was called.  ED course: Upon arrival to the emergency room temperature 97.7 pulse 111 blood pressure 144/94 saturating 82 on 50%. Patient was initially/ placed on nonrebreather oxygenation and saturation was in the 70s and subsequently placed on BiPAP with improvement in saturation to the high 90s.  Chest x-ray obtained showed large right-sided pleural effusion as well as bilateral patchy opacities concerning for possible pneumonia/pulmonary edema.   Pulmonary was also consulted.  Chest tube was placed by pulmonary, postprocedural x-ray with right apical pneumothorax.  5/29: Able to wean to 8 L of oxygen.  Respiratory viral panel negative.  Preliminary blood cultures negative.  Preliminary pleural fluid culture negative. Pleural fluid LDH elevated at 458, protein 4.2. CT chest today with kinked chest tube, right hydropneumothorax with small Hydro component and a moderate pneumo component.  Background advanced pulmonary fibrosis with progressive honeycomb lung disease and bronchiectasis.  Increased groundglass degeneration in the posterior left upper lobe which can be either due to interstitial disease or groundglass pneumonitis. Stable mediastinal and hilar adenopathy.  Concern of pulmonary hypertension.  Pulmonology think that he developed pneumothorax a while ago  as he was having worsening symptoms for couple of months, effusion developed to cover that area as he is now experiencing trapped lung which did not expand after removal of fluid.  Persistent hydropneumothorax.  Recommending hospice.  Patient already established with outpatient palliative care/hospice.  Patient has very negative pleural space pressure, chest tube clamped as drainage was more than 1.5 L to prevent reexpansion edema.  His pleural pressure is consistent with nonexpandable/trapped lung.  Patient wants to go home with Pleurx catheter-pulmonary to reevaluate in the afternoon.  Patient will be high risk for any infection which can cause rehospitalization's.  He wants to go home with hospice    Assessment and Plan: * Acute on chronic hypoxic respiratory failure (HCC) Large right-sided pleural effusion.  Collapsed lung with hydropneumothorax. Patient uses 3 L of oxygen at baseline, currently on 8 L S/p chest tube placement by pulmonary-preliminary cultures negative. Concern of trapped lung with chronic pneumothorax with his end-stage underlying pulmonary fibrosis, resulted in pleural thickening and effusion.  Lung failed to expand with chest tube. Pulmonary is recommending home with hospice. Patient will decide about going home with chest tube versus Pleurx catheter as he will be high risk for recurrent effusions. 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 -Continue bronchodilators and BiPAP at night  Hypertension -Blood pressure within goal -Continue home amlodipine  CAD, NATIVE VESSEL S/p stent placement. -Continue home aspirin and statin  Hyperlipidemia -Continue statin  Type 2 diabetes mellitus without complication (HCC) Patient takes Jardiance and Actos at home. -Continue Jardiance -SSI   Subjective: Patient was seen and examined today.  Continues to have  significant shortness of breath with minor exertion.  Wants to stay  at home with hospice.  No chest pain  Physical Exam: Vitals:   10/25/22 1200 10/25/22 1230 10/25/22 1300 10/25/22 1330  BP: 129/81 126/77 117/73 119/78  Pulse: 94 81 78 79  Resp:      Temp:   98.2 F (36.8 C)   TempSrc:   Oral   SpO2: 96% 99% 97% 99%  Weight:      Height:       General.  Frail and malnourished elderly man, in no acute distress. Pulmonary.  Decreased breath sound on right, normal respiratory effort. CV.  Regular rate and rhythm, no JVD, rub or murmur. Abdomen.  Soft, nontender, nondistended, BS positive. CNS.  Alert and oriented .  No focal neurologic deficit. Extremities.  No edema, no cyanosis, pulses intact and symmetrical. Psychiatry.  Judgment and insight appears normal.   Data Reviewed: Prior data reviewed  Family Communication: Discussed with son on phone  Disposition: Status is: Inpatient Remains inpatient appropriate because: Severity of illness.  Patient will be going home with hospice once everything is arranged  Planned Discharge Destination:  Home with hospice  DVT prophylaxis.  Lovenox Time spent: 50 minutes  This record has been created using Conservation officer, historic buildings. Errors have been sought and corrected,but may not always be located. Such creation errors do not reflect on the standard of care.   Author: Arnetha Courser, MD 10/25/2022 2:03 PM  For on call review [email or web address].

## 2022-10-25 NOTE — ED Notes (Signed)
Bambie RT at bedside, patient placed on 8L high flow; O2 sat 96%

## 2022-10-25 NOTE — Assessment & Plan Note (Signed)
Continue statin. 

## 2022-10-25 NOTE — ED Notes (Signed)
Bambie, RT made aware of heated high flow changes

## 2022-10-25 NOTE — Assessment & Plan Note (Signed)
Patient takes Jardiance and Actos at home. -Continue Jardiance -SSI

## 2022-10-25 NOTE — Progress Notes (Signed)
[facility]  ([facility]) Hospital Liaison note:  This patient is currently enrolled in [facility] outpatient-based palliative care.  [facility] will continue  to follow for discharge disposition.    Please call for any outpatient based palliative care related questions or concerns.  [facility] Liaison [phone] 

## 2022-10-25 NOTE — Assessment & Plan Note (Signed)
Blood pressure within goal. -Continue home amlodipine 

## 2022-10-25 NOTE — Assessment & Plan Note (Signed)
S/p stent placement. -Continue home aspirin and statin

## 2022-10-25 NOTE — Hospital Course (Addendum)
Taken from prior notes.   Shane Reid. is a 77 y.o. male with medical history significant of severe pulmonary fibrosis follows up with a pulmonologist, coronary artery disease status post 4 stent placement, COPD, diabetes mellitus with neuropathy, GERD, hyperlipidemia, hypertension, hypertensive heart disease who has been having gradual worsening of baseline shortness of breath for the last 1 month, due to continuous worsening shortness of breath EMS was called.  ED course: Upon arrival to the emergency room temperature 97.7 pulse 111 blood pressure 144/94 saturating 82 on 50%. Patient was initially/ placed on nonrebreather oxygenation and saturation was in the 70s and subsequently placed on BiPAP with improvement in saturation to the high 90s.  Chest x-ray obtained showed large right-sided pleural effusion as well as bilateral patchy opacities concerning for possible pneumonia/pulmonary edema.   Pulmonary was also consulted.  Chest tube was placed by pulmonary, postprocedural x-ray with right apical pneumothorax.  5/29: Able to wean to 8 L of oxygen.  Respiratory viral panel negative.  Preliminary blood cultures negative.  Preliminary pleural fluid culture negative. Pleural fluid LDH elevated at 458, protein 4.2. CT chest today with kinked chest tube, right hydropneumothorax with small Hydro component and a moderate pneumo component.  Background advanced pulmonary fibrosis with progressive honeycomb lung disease and bronchiectasis.  Increased groundglass degeneration in the posterior left upper lobe which can be either due to interstitial disease or groundglass pneumonitis. Stable mediastinal and hilar adenopathy.  Concern of pulmonary hypertension.  Pulmonology think that he developed pneumothorax a while ago as he was having worsening symptoms for couple of months, effusion developed to cover that area as he is now experiencing trapped lung which did not expand after removal of fluid.   Persistent hydropneumothorax.  Recommending hospice.  Patient already established with outpatient palliative care/hospice.  Patient has very negative pleural space pressure, chest tube clamped as drainage was more than 1.5 L to prevent reexpansion edema.  His pleural pressure is consistent with nonexpandable/trapped lung.  Patient wants to go home with Pleurx catheter-pulmonary to reevaluate in the afternoon.  Patient will be high risk for any infection which can cause rehospitalization's.  He wants to go home with hospice  5/30: Vitals, labs and saturations stable at 4 to 5 L of oxygen.  No significant recollection of fluid despite clamping chest tube the whole day yesterday.  Pulmonary drained the fluid and removed chest tube.  Patient need to follow-up with pulmonary next week for Pleurx catheter placement. Hospice will help that.  Patient is being discharged home with hospice.  Guarded prognosis due to end-stage pulmonary fibrosis and trapped right lung.  He will continue on current medications and need to have a close follow-up with his providers for further recommendations.

## 2022-10-26 ENCOUNTER — Telehealth: Payer: Self-pay | Admitting: Student in an Organized Health Care Education/Training Program

## 2022-10-26 ENCOUNTER — Inpatient Hospital Stay: Payer: 59

## 2022-10-26 DIAGNOSIS — J90 Pleural effusion, not elsewhere classified: Secondary | ICD-10-CM | POA: Diagnosis not present

## 2022-10-26 DIAGNOSIS — R0602 Shortness of breath: Secondary | ICD-10-CM | POA: Diagnosis not present

## 2022-10-26 DIAGNOSIS — J9621 Acute and chronic respiratory failure with hypoxia: Secondary | ICD-10-CM | POA: Diagnosis not present

## 2022-10-26 DIAGNOSIS — B0229 Other postherpetic nervous system involvement: Secondary | ICD-10-CM

## 2022-10-26 DIAGNOSIS — J84112 Idiopathic pulmonary fibrosis: Secondary | ICD-10-CM | POA: Diagnosis not present

## 2022-10-26 DIAGNOSIS — I10 Essential (primary) hypertension: Secondary | ICD-10-CM | POA: Diagnosis not present

## 2022-10-26 DIAGNOSIS — J9383 Other pneumothorax: Secondary | ICD-10-CM

## 2022-10-26 LAB — CBG MONITORING, ED: Glucose-Capillary: 109 mg/dL — ABNORMAL HIGH (ref 70–99)

## 2022-10-26 LAB — CBC WITH DIFFERENTIAL/PLATELET
Abs Immature Granulocytes: 0.06 10*3/uL (ref 0.00–0.07)
Basophils Absolute: 0 10*3/uL (ref 0.0–0.1)
Basophils Relative: 0 %
Eosinophils Absolute: 0 10*3/uL (ref 0.0–0.5)
Eosinophils Relative: 0 %
HCT: 43.4 % (ref 39.0–52.0)
Hemoglobin: 13.4 g/dL (ref 13.0–17.0)
Immature Granulocytes: 1 %
Lymphocytes Relative: 7 %
Lymphs Abs: 0.8 10*3/uL (ref 0.7–4.0)
MCH: 28 pg (ref 26.0–34.0)
MCHC: 30.9 g/dL (ref 30.0–36.0)
MCV: 90.8 fL (ref 80.0–100.0)
Monocytes Absolute: 0.9 10*3/uL (ref 0.1–1.0)
Monocytes Relative: 8 %
Neutro Abs: 9.6 10*3/uL — ABNORMAL HIGH (ref 1.7–7.7)
Neutrophils Relative %: 84 %
Platelets: 242 10*3/uL (ref 150–400)
RBC: 4.78 MIL/uL (ref 4.22–5.81)
RDW: 14.8 % (ref 11.5–15.5)
WBC: 11.4 10*3/uL — ABNORMAL HIGH (ref 4.0–10.5)
nRBC: 0 % (ref 0.0–0.2)

## 2022-10-26 LAB — BASIC METABOLIC PANEL
Anion gap: 8 (ref 5–15)
BUN: 30 mg/dL — ABNORMAL HIGH (ref 8–23)
CO2: 31 mmol/L (ref 22–32)
Calcium: 8.3 mg/dL — ABNORMAL LOW (ref 8.9–10.3)
Chloride: 100 mmol/L (ref 98–111)
Creatinine, Ser: 0.71 mg/dL (ref 0.61–1.24)
GFR, Estimated: >60 mL/min (ref >60)
Glucose, Bld: 120 mg/dL — ABNORMAL HIGH (ref 70–99)
Potassium: 4.1 mmol/L (ref 3.5–5.1)
Sodium: 139 mmol/L (ref 135–145)

## 2022-10-26 LAB — HEMOGLOBIN A1C
Hgb A1c MFr Bld: 9 % — ABNORMAL HIGH (ref 4.8–5.6)
Mean Plasma Glucose: 212 mg/dL

## 2022-10-26 LAB — TRIGLYCERIDES, BODY FLUIDS: Triglycerides, Fluid: 17 mg/dL

## 2022-10-26 MED ORDER — LEVEMIR FLEXPEN 100 UNIT/ML ~~LOC~~ SOPN: 10.0000 [IU] | PEN_INJECTOR | Freq: Every day | SUBCUTANEOUS | 0 refills | Status: DC

## 2022-10-26 NOTE — Telephone Encounter (Signed)
Joy with [facility] hospice is aware of scheduled pleurx catheter insertion.  She voiced her understanding and stated that she would make note in patient's chart.  Nothing further needed.

## 2022-10-26 NOTE — Telephone Encounter (Signed)
PleurX scheduled 11/01/2022 at 11:30. Carma Lair, RN will notify patient and patient's son of appt for epic secure chat.

## 2022-10-26 NOTE — H&P (View-Only) (Signed)
 NAME:  Shane Reid., MRN:  [id number], DOB:  06/22/1945, LOS: 2 ADMISSION DATE:  10/24/2022, CHIEF COMPLAINT:  respiratory failure, IPF, PTX   History of Present Illness:   76 yo M presenting to [facility] ED from home on 10/24/2022 for evaluation of gradual worsening of baseline shortness of breath over the last month.  Patient reported his shortness of breath which is poor at baseline worsened significantly over the last week and became more intense within the last few days. He also reported increased shortness of breath that was subacute over the past few months. He reported becoming extremely winded with minimal exertion as well as chest heaviness. He is on chronic 3 -5 L nasal cannula.  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 His home health nurse documented that his SpO2 was consistently in the 70's with minimal exertion at home. He had also been prescribed Benzonatate by PCP but it is too expensive for him to obtain.  Dyspnea was associated with a dry cough.  Patient denied fever, chest pain, abdominal pain/ nausea/ vomiting/ diarrhea.  EMS was called out for respiratory distress, reporting the patient to be severely hypoxic at 30% on room air.  He was placed on a nonrebreather with SpO2 of 70%, therefore transition to CPAP support.  He received Solu-Medrol & magnesium.   ED course:  Upon arrival patient alert and oriented, tachypneic and tachycardic with significant work of breathing and was transitioned to BiPAP support.  Imaging notable for large right-sided pleural effusion as well as possible infection in the setting of severe ILD.  Labs significant for hyperglycemia, very mild leukocytosis and PCT 0.11.  TRH consulted for admission due to acute hypoxic respiratory. Pulmonology consulted for respiratory failure and pleural effusion. A pig tail chest tube was placed at the  bedside.  Pertinent  Medical History  IPF CAD status post PCI x 4 COPD Diabetes mellitus with neuropathy Hypertension Hyperlipidemia GERD HOH  Significant Hospital Events: Including procedures, antibiotic start and stop dates in addition to other pertinent events   10/24/2022: Admit to PCU due to acute hypoxic respiratory failure secondary to large right pleural effusion and possible CAP on BiPAP support.  PCCM consult 10/25/2022: chest tube to water seal and suction, pneumothorax persisted 10/26/2022: repeat POCUS of the chest, minimal effusion. Chest tube discontinued   Interim History / Subjective:  Continues to be short of breath, close to baseline  Objective   Blood pressure 104/67, pulse 62, temperature 97.6 F (36.4 C), temperature source Oral, resp. rate 17, height 5' 9" (1.753 m), weight 64 kg, SpO2 100 %.        Intake/Output Summary (Last 24 hours) at 10/26/2022 0847 Last data filed at 10/26/2022 0815 Gross per 24 hour  Intake 250 ml  Output 1115 ml  Net -865 ml   Filed Weights   10/24/22 1514  Weight: 64 kg    Examination: Physical Exam Constitutional:      General: He is not in acute distress.    Appearance: He is ill-appearing.  Cardiovascular:     Rate and Rhythm: Normal rate and regular rhythm.     Pulses: Normal pulses.     Heart sounds: Normal heart sounds.  Pulmonary:     Comments: Decreased breath sounds over the right, rales over the left lung field Abdominal:     General: Abdomen is flat.       Palpations: Abdomen is soft.  Musculoskeletal:     Right lower leg: No edema.     Left lower leg: No edema.  Neurological:     General: No focal deficit present.     Mental Status: He is alert and oriented to person, place, and time. Mental status is at baseline.     Assessment & Plan:   #Acute on Chronic Hypoxic Respiratory Failure #Idiopathic Pulmonary Fibrosis/Usual Interstitial Pneumonia #Pneumothorax Ex-Vacuo  Shane Reid is a 76 year old  male with a history of biopsy proven IPF/UIP (wedge biopsy), previously followed by pulmonology at [facility] presenting with acute on chronic hypoxic respiratory failure. He is not on nintedanib (or other antifibrotics) secondary to intolerance of side effects. He is presenting to the hospital with increased shortness of breath and found to have a large right sided pleural effusion, confirmed to be simple appearing on ultrasound at the bedside.   He underwent pigtail chest tube placement on 5/28 with total drainage of 1.8 liters. Imaging showed a right sided pneumothorax, confirmed on chest Ct with pigtail in the correct position and the presence of a hydropneumothorax. Pleural pressure estimated to be negative <-20 cmH2O consistent with trapped/entrapped lung physiology. Furthermore there was good tidal movement in the water seal chamber with no continuous air leak to suggest a bronchopleural or alvelo-pleural fistula. Pleural fluid sent for cytology and pending.   I suspect that Shane Reid developed a pneumothorax a few months ago, coinciding with the onset of his symptoms. His lung did not re-expand and a pleural effusion ensued. He has trapped/entrapped lung with negative intrapleural pressures, and I am concerned that his right lung is not going to re-expand. With the drainage of the pleural fluid, his hypoxic respiratory failure has improved and he is now back on nasal cannula (albeit at a higher rate).  I discussed all this with Shane Reid yesterday, and spoke to his son over the phone. I am concerned that with chest tube discontinuation his pleural effusion will recur and he will again develop a large pleural effusion with recurring respiratory failure. 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 I recommended he re-engage with hospice and consider going home with hospice. Consideration could be given to an indwelling  pleural catheter for palliative purposes should he choose this pathway. We did clamp the chest tube to assess the rate with which his effusion is recurring, and on ultrasound this morning, the fluid pocket was small, and not safe for the placement of an indwelling pleural catheter. Given this, I elected to discontinue the chest tube and have the patient follow up with us in clinic for consideration for the placement of PleurX. This could also be done with interventional radiology.  Finally, he was on chronic prednisone based on his outpatient dispense report. There is no role for prednisone in IPF, and I recommend it be discontinued. If it is continued, he would require bactrim for PJP prophylaxis.  -d/c home with hospice -return to clinic for indwelling pleural catheter (pleurX) placement  Darnella Zeiter, MD [facility] 10/26/2022 9:00 AM   Labs   CBC: Recent Labs  Lab 10/24/22 1527 10/25/22 0418 10/26/22 0426  WBC 11.7* 10.3 11.4*  NEUTROABS 11.0* 9.5* 9.6*  HGB 15.0 14.5 13.4  HCT 47.6 46.3 43.4  MCV 89.3 90.6 90.8  PLT 311 247 242    Basic Metabolic Panel: Recent Labs    Lab 10/24/22 1527 10/25/22 0803 10/26/22 0426  NA 138 143 139  K 4.6 5.4* 4.1  CL 98 99 100  CO2 29 31 31  GLUCOSE 263* 176* 120*  BUN 21 34* 30*  CREATININE 0.90 1.03 0.71  CALCIUM 9.1 9.3 8.3*   GFR: Estimated Creatinine Clearance: 70 mL/min (by C-G formula based on SCr of 0.71 mg/dL). Recent Labs  Lab 10/24/22 1527 10/25/22 0418 10/26/22 0426  PROCALCITON 0.11  --   --   WBC 11.7* 10.3 11.4*    Liver Function Tests: Recent Labs  Lab 10/24/22 1518  PROT 6.9   No results for input(s): "LIPASE", "AMYLASE" in the last 168 hours. No results for input(s): "AMMONIA" in the last 168 hours.  ABG    Component Value Date/Time   PHART 7.45 09/04/2019 1425   PCO2ART 39 09/04/2019 1425   PO2ART 79 (L) 09/04/2019 1425   HCO3 30.5 (H) 10/24/2022 1525   TCO2 24 06/30/2011 1351    ACIDBASEDEF 0.1 08/05/2019 1500   O2SAT 87.3 10/24/2022 1525     Coagulation Profile: No results for input(s): "INR", "PROTIME" in the last 168 hours.  Cardiac Enzymes: No results for input(s): "CKTOTAL", "CKMB", "CKMBINDEX", "TROPONINI" in the last 168 hours.  HbA1C: Hgb A1c MFr Bld  Date/Time Value Ref Range Status  10/24/2022 03:27 PM 9.0 (H) 4.8 - 5.6 % Final    Comment:    (NOTE)         Prediabetes: 5.7 - 6.4         Diabetes: >6.4         Glycemic control for adults with diabetes: <7.0   03/15/2020 03:29 AM 6.7 (H) 4.8 - 5.6 % Final    Comment:    (NOTE)         Prediabetes: 5.7 - 6.4         Diabetes: >6.4         Glycemic control for adults with diabetes: <7.0     CBG: Recent Labs  Lab 10/25/22 0817 10/25/22 1140 10/25/22 1744 10/25/22 2129 10/26/22 0725  GLUCAP 238* 130* 308* 108* 109*    Past Medical History:  He,  has a past medical history of Anxiety, Asthma, Bradycardia, CHF (congestive heart failure) (HCC), Chronic Chest Pain, COPD (chronic obstructive pulmonary disease) (HCC), Coronary artery disease, Depression, Diabetes mellitus without complication (HCC), GERD (gastroesophageal reflux disease), Heart murmur, Hypercholesteremia, Hypertension, Hypertensive heart disease, Idiopathic pulmonary fibrosis (HCC), and Other social stressor.   Surgical History:   Past Surgical History:  Procedure Laterality Date   CARDIAC CATHETERIZATION     CORONARY ANGIOPLASTY     stents times 4   ESOPHAGOGASTRODUODENOSCOPY (EGD) WITH PROPOFOL N/A 10/22/2019   Procedure: ESOPHAGOGASTRODUODENOSCOPY (EGD) WITH PROPOFOL;  Surgeon: Tahiliani, Varnita B, MD;  Location: [facility];  Service: Endoscopy;  Laterality: N/A;   Esophagus stretch     EYE SURGERY     Bilateral cataracts   LUNG BIOPSY     NASAL ENDOSCOPY N/A 12/21/2016   Procedure: NASAL ENDOSCOPY;  Surgeon: Juengel, Paul, MD;  Location: [facility];  Service: ENT;  Laterality: N/A;   TESTICLE  SURGERY     VIDEO ASSISTED THORACOSCOPY (VATS)/THOROCOTOMY Right 11/18/2014   Procedure: VIDEO ASSISTED THORACOSCOPY (VATS)/ wedge resection biopsy;  Surgeon: Timothy Oaks, MD;  Location: [facility] ORS;  Service: General;  Laterality: Right;     Social History:   reports that he quit smoking about 54 years ago. His smoking use included cigarettes. He has a 0.50 pack-year   smoking history. He has never used smokeless tobacco. He reports that he does not drink alcohol and does not use drugs.   Family History:  His family history includes Coronary artery disease in his mother; Depression in an other family member; Heart disease in an other family member; Heart failure in his mother.   Allergies Allergies  Allergen Reactions   Nintedanib Other (See Comments)   Metoprolol Other (See Comments)   Metoprolol Succinate Other (See Comments)    Reaction:  Unknown    Niacin Other (See Comments)    Reaction:  Unknown    Sertraline Hcl Other (See Comments)    Reaction:  Unknown    Sertraline Hcl Other (See Comments)     Home Medications  Prior to Admission medications   Medication Sig Start Date End Date Taking? Authorizing Provider  acetaminophen (TYLENOL) 325 MG tablet 325 MG AS NEEDED ORALLY EVERY 6 HOURS AS NEEDED FOR PAIN/FEVER 30 DAYS   Yes [provider]  albuterol (PROVENTIL) (2.5 MG/3ML) 0.083% nebulizer solution Take 3 mLs (2.5 mg total) by nebulization every 6 (six) hours as needed. 05/02/12  Yes Gollan, [name], MD  albuterol (VENTOLIN HFA) 108 (90 Base) MCG/ACT inhaler Inhale 2 puffs into the lungs every 4 (four) hours as needed for wheezing. 01/22/06  Yes [provider]  ALPRAZolam (XANAX) 0.5 MG tablet Take 0.5 mg by mouth every 8 (eight) hours as needed. 09/04/22  Yes [provider]  amLODipine (NORVASC) 5 MG tablet Take 1 tablet (5 mg total) by mouth daily. 12/20/21  Yes Gollan, [name], MD  aspirin EC 81 MG tablet Take 81 mg by mouth every morning. 08/06/12   Yes [provider]  docusate sodium (COLACE) 100 MG capsule Take 100 mg by mouth daily. 12/15/21  Yes [provider]  JARDIANCE 25 MG TABS tablet Take 25 mg by mouth daily. 10/28/21  Yes [provider]  LEVEMIR FLEXPEN 100 UNIT/ML FlexPen 20u AM 15u bedtime Subcutaneous twice a day for 30 days 09/12/22  Yes [provider]  nitroGLYCERIN (NITROSTAT) 0.4 MG SL tablet Place 1 tablet (0.4 mg total) under the tongue every 5 (five) minutes as needed. 07/30/17  Yes Gollan, [name], MD  Oxycodone HCl 10 MG TABS Take 10 mg by mouth every 6 (six) hours. 10/05/22 10/17/23 Yes [provider]  pioglitazone (ACTOS) 45 MG tablet Take 45 mg by mouth daily.   Yes [provider]  predniSONE (DELTASONE) 20 MG tablet Take 1 tablet (20 mg total) by mouth daily with breakfast. 03/15/20  Yes Grunz, Ryan B, MD  pregabalin (LYRICA) 50 MG capsule Take 100 mg by mouth 2 (two) times daily.  08/13/19  Yes [provider]  QUEtiapine (SEROQUEL) 25 MG tablet Take 25 mg by mouth at bedtime. 10/25/21  Yes [provider]  rosuvastatin (CRESTOR) 10 MG tablet Take 1 tablet (10 mg total) by mouth daily. Take 1 tablet (10 mg) by mouth once daily. 04/06/22  Yes Gollan, [name], MD  torsemide (DEMADEX) 20 MG tablet as needed Orally daily for 30 days 10/05/22  Yes [provider]  Accu-Chek Softclix Lancets lancets USE AS DIRECTED EVERY DAY 04/12/19   [provider]  benzonatate (TESSALON) 100 MG capsule Take 1 capsule (100 mg total) by mouth 3 (three) times daily as needed for cough. Patient not taking: Reported on 10/24/2022 07/23/20   Goodman, Graydon, MD  Blood Glucose Monitoring Suppl (ACCU-CHEK GUIDE ME) w/Device KIT DX E11.9 (MEDICAID PREFERRED) 04/11/19     [provider]  cefdinir (OMNICEF) 300 MG capsule Take 1 capsule (300 mg total) by mouth 2 (two) times daily. Patient not taking: Reported on 12/20/2021 03/15/20   Grunz, Ryan B, MD   diclofenac Sodium (VOLTAREN) 1 % GEL Apply 1 application topically 4 (four) times daily.  Patient not taking: Reported on 10/24/2022 11/07/19   [provider]  guaiFENesin-codeine 100-10 MG/5ML syrup Take 5 mLs by mouth every 4 (four) hours as needed. Patient not taking: Reported on 10/24/2022 09/28/20   [provider]  HYDROcodone-acetaminophen (NORCO/VICODIN) 5-325 MG tablet Take 1 tablet by mouth every 4 (four) hours as needed for moderate pain. Patient not taking: Reported on 10/24/2022 02/24/20   [name], Jonathan D, PA-C  ipratropium-albuterol (DUONEB) 0.5-2.5 (3) MG/3ML SOLN Take 3 mLs by nebulization 3 (three) times daily. Patient not taking: Reported on 10/24/2022 10/10/20   [provider]  lactulose (CHRONULAC) 10 GM/15ML solution Take 30 mLs (20 g total) by mouth daily as needed for mild constipation. Patient not taking: Reported on 10/24/2022 12/28/19   Sung, Jade J, MD  LORazepam (ATIVAN) 0.5 MG tablet Take 0.5 mg by mouth every 4 (four) hours as needed. Patient not taking: Reported on 10/24/2022 09/17/20   [provider]  metFORMIN (GLUCOPHAGE) 1000 MG tablet Take 1,000 mg by mouth 2 (two) times daily with a meal. Patient not taking: Reported on 12/20/2021    [provider]  Multiple Vitamins-Minerals (CENTRUM SILVER PO) Take 1 tablet by mouth daily.  Patient not taking: Reported on 08/11/2021    [provider]  oxyCODONE (ROXICODONE INTENSOL) 20 MG/ML concentrated solution SMARTSIG:0.5 Milliliter(s) By Mouth Every 4 Hours PRN Patient not taking: Reported on 10/24/2022 10/06/20   [provider]  pantoprazole (PROTONIX) 40 MG tablet Take 1 tablet (40 mg total) by mouth daily. Patient not taking: Reported on 10/24/2022 11/10/19   Tahiliani, Varnita B, MD  predniSONE (STERAPRED UNI-PAK 21 TAB) 10 MG (21) TBPK tablet Per packaging instructions Patient not taking: Reported on 10/24/2022 07/23/20   Goodman, Graydon, MD      I spent  55 minutes caring for this patient today, including preparing to see the patient, obtaining a medical history , reviewing a separately obtained history, performing a medically appropriate examination and/or evaluation, counseling and educating the patient/family/caregiver, ordering medications, tests, or procedures, referring and communicating with other health care professionals (not separately reported), documenting clinical information in the electronic health record, and independently interpreting results (not separately reported/billed) and communicating results to the patient/family/caregiver    

## 2022-10-26 NOTE — Progress Notes (Signed)
NAME:  Shane Reid., MRN:  [id number], DOB:  May 06, 1946, LOS: 2 ADMISSION DATE:  10/24/2022, CHIEF COMPLAINT:  respiratory failure, IPF, PTX   History of Present Illness:   77 yo M presenting to [facility] ED from home on 10/24/2022 for evaluation of gradual worsening of baseline shortness of breath over the last month.  Patient reported his shortness of breath which is poor at baseline worsened significantly over the last week and became more intense within the last few days. He also reported increased shortness of breath that was subacute over the past few months. He reported becoming extremely winded with minimal exertion as well as chest heaviness. He is on chronic 3 -5 L nasal cannula.  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 His home health nurse documented that his SpO2 was consistently in the 70's with minimal exertion at home. He had also been prescribed Benzonatate by PCP but it is too expensive for him to obtain.  Dyspnea was associated with a dry cough.  Patient denied fever, chest pain, abdominal pain/ nausea/ vomiting/ diarrhea.  EMS was called out for respiratory distress, reporting the patient to be severely hypoxic at 30% on room air.  He was placed on a nonrebreather with SpO2 of 70%, therefore transition to CPAP support.  He received Solu-Medrol & magnesium.   ED course:  Upon arrival patient alert and oriented, tachypneic and tachycardic with significant work of breathing and was transitioned to BiPAP support.  Imaging notable for large right-sided pleural effusion as well as possible infection in the setting of severe ILD.  Labs significant for hyperglycemia, very mild leukocytosis and PCT 0.11.  TRH consulted for admission due to acute hypoxic respiratory. Pulmonology consulted for respiratory failure and pleural effusion. A pig tail chest tube was placed at the  bedside.  Pertinent  Medical History  IPF CAD status post PCI x 4 COPD Diabetes mellitus with neuropathy Hypertension Hyperlipidemia GERD [facility]  Significant Hospital Events: Including procedures, antibiotic start and stop dates in addition to other pertinent events   10/24/2022: Admit to PCU due to acute hypoxic respiratory failure secondary to large right pleural effusion and possible CAP on BiPAP support.  PCCM consult 10/25/2022: chest tube to water seal and suction, pneumothorax persisted 10/26/2022: repeat POCUS of the chest, minimal effusion. Chest tube discontinued   Interim History / Subjective:  Continues to be short of breath, close to baseline  Objective   Blood pressure 104/67, pulse 62, temperature 97.6 F (36.4 C), temperature source Oral, resp. rate 17, height 5\' 9"  (1.753 m), weight 64 kg, SpO2 100 %.        Intake/Output Summary (Last 24 hours) at 10/26/2022 0847 Last data filed at 10/26/2022 0815 Gross per 24 hour  Intake 250 ml  Output 1115 ml  Net -865 ml   Filed Weights   10/24/22 1514  Weight: 64 kg    Examination: Physical Exam Constitutional:      General: He is not in acute distress.    Appearance: He is ill-appearing.  Cardiovascular:     Rate and Rhythm: Normal rate and regular rhythm.     Pulses: Normal pulses.     Heart sounds: Normal heart sounds.  Pulmonary:     Comments: Decreased breath sounds over the right, rales over the left lung field Abdominal:     General: Abdomen is flat.  Palpations: Abdomen is soft.  Musculoskeletal:     Right lower leg: No edema.     Left lower leg: No edema.  Neurological:     General: No focal deficit present.     Mental Status: He is alert and oriented to person, place, and time. Mental status is at baseline.     Assessment & Plan:   #Acute on Chronic Hypoxic Respiratory Failure #Idiopathic Pulmonary Fibrosis/Usual Interstitial Pneumonia #Pneumothorax Ex-Vacuo  Mr. Hold is a 77 year old  male with a history of biopsy proven IPF/UIP (wedge biopsy), previously followed by pulmonology at [facility] presenting with acute on chronic hypoxic respiratory failure. He is not on nintedanib (or other antifibrotics) secondary to intolerance of side effects. He is presenting to the hospital with increased shortness of breath and found to have a large right sided pleural effusion, confirmed to be simple appearing on ultrasound at the bedside.   He underwent pigtail chest tube placement on 5/28 with total drainage of 1.8 liters. Imaging showed a right sided pneumothorax, confirmed on chest Ct with pigtail in the correct position and the presence of a hydropneumothorax. Pleural pressure estimated to be negative <-20 cmH2O consistent with trapped/entrapped lung physiology. Furthermore there was good tidal movement in the water seal chamber with no continuous air leak to suggest a bronchopleural or alvelo-pleural fistula. Pleural fluid sent for cytology and pending.   I suspect that Mr. Riano developed a pneumothorax a few months ago, coinciding with the onset of his symptoms. His lung did not re-expand and a pleural effusion ensued. He has trapped/entrapped lung with negative intrapleural pressures, and I am concerned that his right lung is not going to re-expand. With the drainage of the pleural fluid, his hypoxic respiratory failure has improved and he is now back on nasal cannula (albeit at a higher rate).  I discussed all this with Mr. Garg yesterday, and spoke to his son over the phone. I am concerned that with chest tube discontinuation his pleural effusion will recur and he will again develop a large pleural effusion with recurring respiratory failure. 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 I recommended he re-engage with hospice and consider going home with hospice. Consideration could be given to an indwelling  pleural catheter for palliative purposes should he choose this pathway. We did clamp the chest tube to assess the rate with which his effusion is recurring, and on ultrasound this morning, the fluid pocket was small, and not safe for the placement of an indwelling pleural catheter. Given this, I elected to discontinue the chest tube and have the patient follow up with [place] in clinic for consideration for the placement of PleurX. This could also be done with interventional radiology.  Finally, he was on chronic prednisone based on his outpatient dispense report. There is no role for prednisone in IPF, and I recommend it be discontinued. If it is continued, he would require bactrim for PJP prophylaxis.  -d/c home with hospice -return to clinic for indwelling pleural catheter (pleurX) placement  [name], MD [facility] 10/26/2022 9:00 AM   Labs   CBC: Recent Labs  Lab 10/24/22 1527 10/25/22 0418 10/26/22 0426  WBC 11.7* 10.3 11.4*  NEUTROABS 11.0* 9.5* 9.6*  HGB 15.0 14.5 13.4  HCT 47.6 46.3 43.4  MCV 89.3 90.6 90.8  PLT 311 247 242    Basic Metabolic Panel: Recent Labs  Lab 10/24/22 1527 10/25/22 0803 10/26/22 0426  NA 138 143 139  K 4.6 5.4* 4.1  CL 98 99 100  CO2 29 31 31   GLUCOSE 263* 176* 120*  BUN 21 34* 30*  CREATININE 0.90 1.03 0.71  CALCIUM 9.1 9.3 8.3*   GFR: Estimated Creatinine Clearance: 70 mL/min (by C-G formula based on SCr of 0.71 mg/dL). Recent Labs  Lab 10/24/22 1527 10/25/22 0418 10/26/22 0426  PROCALCITON 0.11  --   --   WBC 11.7* 10.3 11.4*    Liver Function Tests: Recent Labs  Lab 10/24/22 1518  PROT 6.9   No results for input(s): "LIPASE", "AMYLASE" in the last 168 hours. No results for input(s): "AMMONIA" in the last 168 hours.  ABG    Component Value Date/Time   PHART 7.45 09/04/2019 1425   PCO2ART 39 09/04/2019 1425   PO2ART 79 (L) 09/04/2019 1425   HCO3 30.5 (H) 10/24/2022 1525   TCO2 24 06/30/2011 1351    ACIDBASEDEF 0.1 08/05/2019 1500   O2SAT 87.3 10/24/2022 1525     Coagulation Profile: No results for input(s): "INR", "PROTIME" in the last 168 hours.  Cardiac Enzymes: No results for input(s): "CKTOTAL", "CKMB", "CKMBINDEX", "TROPONINI" in the last 168 hours.  HbA1C: Hgb A1c MFr Bld  Date/Time Value Ref Range Status  10/24/2022 03:27 PM 9.0 (H) 4.8 - 5.6 % Final    Comment:    (NOTE)         Prediabetes: 5.7 - 6.4         Diabetes: >6.4         Glycemic control for adults with diabetes: <7.0   03/15/2020 03:29 AM 6.7 (H) 4.8 - 5.6 % Final    Comment:    (NOTE)         Prediabetes: 5.7 - 6.4         Diabetes: >6.4         Glycemic control for adults with diabetes: <7.0     CBG: Recent Labs  Lab 10/25/22 0817 10/25/22 1140 10/25/22 1744 10/25/22 2129 10/26/22 0725  GLUCAP 238* 130* 308* 108* 109*    Past Medical History:  He,  has a past medical history of Anxiety, Asthma, Bradycardia, CHF (congestive heart failure) (HCC), Chronic Chest Pain, COPD (chronic obstructive pulmonary disease) (HCC), Coronary artery disease, Depression, Diabetes mellitus without complication (HCC), GERD (gastroesophageal reflux disease), Heart murmur, Hypercholesteremia, Hypertension, Hypertensive heart disease, Idiopathic pulmonary fibrosis (HCC), and Other social stressor.   Surgical History:   Past Surgical History:  Procedure Laterality Date   CARDIAC CATHETERIZATION     CORONARY ANGIOPLASTY     stents times 4   ESOPHAGOGASTRODUODENOSCOPY (EGD) WITH PROPOFOL N/A 10/22/2019   Procedure: ESOPHAGOGASTRODUODENOSCOPY (EGD) WITH PROPOFOL;  Surgeon: Pasty Spillers, MD;  Location: [facility] ENDOSCOPY;  Service: Endoscopy;  Laterality: N/A;   Esophagus stretch     EYE SURGERY     Bilateral cataracts   LUNG BIOPSY     NASAL ENDOSCOPY N/A 12/21/2016   Procedure: NASAL ENDOSCOPY;  Surgeon: Vernie Murders, MD;  [facility];  Service: ENT;  Laterality: N/A;   TESTICLE  SURGERY     VIDEO ASSISTED THORACOSCOPY (VATS)/THOROCOTOMY Right 11/18/2014   Procedure: VIDEO ASSISTED THORACOSCOPY (VATS)/ wedge resection biopsy;  Surgeon: Hulda Marin, MD;  Location: [facility] ORS;  Service: General;  Laterality: Right;     Social History:   reports that he quit smoking about 54 years ago. His smoking use included cigarettes. He has a 0.50 pack-year  smoking history. He has never used smokeless tobacco. He reports that he does not drink alcohol and does not use drugs.   Family History:  His family history includes Coronary artery disease in his mother; Depression in an other family member; Heart disease in an other family member; Heart failure in his mother.   Allergies Allergies  Allergen Reactions   Nintedanib Other (See Comments)   Metoprolol Other (See Comments)   Metoprolol Succinate Other (See Comments)    Reaction:  Unknown    Niacin Other (See Comments)    Reaction:  Unknown    Sertraline Hcl Other (See Comments)    Reaction:  Unknown    Sertraline Hcl Other (See Comments)     Home Medications  Prior to Admission medications   Medication Sig Start Date End Date Taking? Authorizing Provider  acetaminophen (TYLENOL) 325 MG tablet 325 MG AS NEEDED ORALLY EVERY 6 HOURS AS NEEDED FOR PAIN/FEVER 30 DAYS   Yes [provider]  albuterol (PROVENTIL) (2.5 MG/3ML) 0.083% nebulizer solution Take 3 mLs (2.5 mg total) by nebulization every 6 (six) hours as needed. 05/02/12  Yes Gollan, Tollie Pizza, MD  albuterol (VENTOLIN HFA) 108 (90 Base) MCG/ACT inhaler Inhale 2 puffs into the lungs every 4 (four) hours as needed for wheezing. 01/22/06  Yes [provider]  ALPRAZolam (XANAX) 0.5 MG tablet Take 0.5 mg by mouth every 8 (eight) hours as needed. 09/04/22  Yes [provider]  amLODipine (NORVASC) 5 MG tablet Take 1 tablet (5 mg total) by mouth daily. 12/20/21  Yes Antonieta Iba, MD  aspirin EC 81 MG tablet Take 81 mg by mouth every morning. 08/06/12   Yes [provider]  docusate sodium (COLACE) 100 MG capsule Take 100 mg by mouth daily. 12/15/21  Yes [provider]  JARDIANCE 25 MG TABS tablet Take 25 mg by mouth daily. 10/28/21  Yes [provider]  LEVEMIR FLEXPEN 100 UNIT/ML FlexPen 20u AM 15u bedtime Subcutaneous twice a day for 30 days 09/12/22  Yes [provider]  nitroGLYCERIN (NITROSTAT) 0.4 MG SL tablet Place 1 tablet (0.4 mg total) under the tongue every 5 (five) minutes as needed. 07/30/17  Yes Gollan, Tollie Pizza, MD  Oxycodone HCl 10 MG TABS Take 10 mg by mouth every 6 (six) hours. 10/05/22 10/17/23 Yes [provider]  pioglitazone (ACTOS) 45 MG tablet Take 45 mg by mouth daily.   Yes [provider]  predniSONE (DELTASONE) 20 MG tablet Take 1 tablet (20 mg total) by mouth daily with breakfast. 03/15/20  Yes Tyrone Nine, MD  pregabalin (LYRICA) 50 MG capsule Take 100 mg by mouth 2 (two) times daily.  08/13/19  Yes [provider]  QUEtiapine (SEROQUEL) 25 MG tablet Take 25 mg by mouth at bedtime. 10/25/21  Yes [provider]  rosuvastatin (CRESTOR) 10 MG tablet Take 1 tablet (10 mg total) by mouth daily. Take 1 tablet (10 mg) by mouth once daily. 04/06/22  Yes Antonieta Iba, MD  torsemide (DEMADEX) 20 MG tablet as needed Orally daily for 30 days 10/05/22  Yes [provider]  Accu-Chek Softclix Lancets lancets USE AS DIRECTED EVERY DAY 04/12/19   [provider]  benzonatate (TESSALON) 100 MG capsule Take 1 capsule (100 mg total) by mouth 3 (three) times daily as needed for cough. Patient not taking: Reported on 10/24/2022 07/23/20   Phineas Semen, MD  Blood Glucose Monitoring Suppl (ACCU-CHEK GUIDE ME) w/Device KIT DX E11.9 (MEDICAID PREFERRED) 04/11/19  [provider]  cefdinir (OMNICEF) 300 MG capsule Take 1 capsule (300 mg total) by mouth 2 (two) times daily. Patient not taking: Reported on 12/20/2021 03/15/20   Tyrone Nine, MD   diclofenac Sodium (VOLTAREN) 1 % GEL Apply 1 application topically 4 (four) times daily.  Patient not taking: Reported on 10/24/2022 11/07/19   [provider]  guaiFENesin-codeine 100-10 MG/5ML syrup Take 5 mLs by mouth every 4 (four) hours as needed. Patient not taking: Reported on 10/24/2022 09/28/20   [provider]  HYDROcodone-acetaminophen (NORCO/VICODIN) 5-325 MG tablet Take 1 tablet by mouth every 4 (four) hours as needed for moderate pain. Patient not taking: Reported on 10/24/2022 02/24/20   [name], Delorise Royals, PA-C  ipratropium-albuterol (DUONEB) 0.5-2.5 (3) MG/3ML SOLN Take 3 mLs by nebulization 3 (three) times daily. Patient not taking: Reported on 10/24/2022 10/10/20   [provider]  lactulose (CHRONULAC) 10 GM/15ML solution Take 30 mLs (20 g total) by mouth daily as needed for mild constipation. Patient not taking: Reported on 10/24/2022 12/28/19   Irean Hong, MD  LORazepam (ATIVAN) 0.5 MG tablet Take 0.5 mg by mouth every 4 (four) hours as needed. Patient not taking: Reported on 10/24/2022 09/17/20   [provider]  metFORMIN (GLUCOPHAGE) 1000 MG tablet Take 1,000 mg by mouth 2 (two) times daily with a meal. Patient not taking: Reported on 12/20/2021    [provider]  Multiple Vitamins-Minerals (CENTRUM SILVER PO) Take 1 tablet by mouth daily.  Patient not taking: Reported on 08/11/2021    [provider]  oxyCODONE (ROXICODONE INTENSOL) 20 MG/ML concentrated solution SMARTSIG:0.5 Milliliter(s) By Mouth Every 4 Hours PRN Patient not taking: Reported on 10/24/2022 10/06/20   [provider]  pantoprazole (PROTONIX) 40 MG tablet Take 1 tablet (40 mg total) by mouth daily. Patient not taking: Reported on 10/24/2022 11/10/19   Pasty Spillers, MD  predniSONE (STERAPRED UNI-PAK 21 TAB) 10 MG (21) TBPK tablet Per packaging instructions Patient not taking: Reported on 10/24/2022 07/23/20   Phineas Semen, MD      I spent  55 minutes caring for this patient today, including preparing to see the patient, obtaining a medical history , reviewing a separately obtained history, performing a medically appropriate examination and/or evaluation, counseling and educating the patient/family/caregiver, ordering medications, tests, or procedures, referring and communicating with other health care professionals (not separately reported), documenting clinical information in the electronic health record, and independently interpreting results (not separately reported/billed) and communicating results to the patient/family/caregiver

## 2022-10-26 NOTE — ED Notes (Addendum)
Pt provided with coffee per request, no further needs at this time. Pt is wearing jeans, gown offered, pt does not wish to change at this time. VSS, NAD. Call light is in reach. WCTM. Chest tube remains clamped at this time, drainage marking on atrium from previous RN is at 1600.

## 2022-10-26 NOTE — Telephone Encounter (Signed)
PT needs to set appt for another DG port in a week. Please call. Sending back [facility] due to PT has collapsed lung and I do not want this to have a call back in three weeks like some tel encounters. TY.

## 2022-10-26 NOTE — Telephone Encounter (Signed)
Spoke to patient and relayed below message. He voiced his understanding and had no further questions or concerns.  Nothing further needed.

## 2022-10-26 NOTE — ED Notes (Signed)
Personal tank partially charged at this time, pt endorsing he wants to go home and his ride is here. Pt taken to ED entrance via WC and oxygen by this RN. Pt transferred self to personal oxygen and into vehicle without difficulty.

## 2022-10-26 NOTE — Progress Notes (Signed)
Chest Tube Removal:  Pigtail catheter sutures were cut and chest tube site cleaned with chloroprep. Chest tube was removed while the patient was instructed to hum. Tube removed in one piece. Wound site was immediately occluded with Xeroform and sterile gauze, pressure held and then taped down with tegaderm. Patient tolerated removal well with no immediate complications.  [name], MD [facility] 10/26/2022 8:45 AM

## 2022-10-26 NOTE — Telephone Encounter (Signed)
According to appt desk, patient was dismissed from practice in 2016.  Lm for patient.

## 2022-10-26 NOTE — ED Notes (Addendum)
AVS explained to pt, pt verbalized understanding. Pt endorsing portable oxygen charged and ready to go. After disconnecting pt and removing IV's, portable machine will not turn on. Machine plugged in and charging at this time.

## 2022-10-26 NOTE — Progress Notes (Signed)
Patient to discharge home today with [facility] services at home. Carolin Coy liaison aware. Patient's son to transport home.   No further discharge needs identified at this time.   Darolyn Rua, [place], MSW, [place] [phone]

## 2022-10-26 NOTE — Discharge Summary (Signed)
Physician Discharge Summary   Patient: Shane Reid. MRN: [id number] DOB: 03/16/1946  Admit date:     10/24/2022  Discharge date: 10/26/22  Discharge Physician: Arnetha Courser   PCP: Mickel Fuchs, MD   Recommendations at discharge:  Patient is being discharged home with hospice Follow-up with pulmonology for Pleurx catheter placement in 1 to 2 weeks. Follow-up with primary care provider  Discharge Diagnoses: Principal Problem:   Acute on chronic hypoxic respiratory failure (HCC) Active Problems:   IPF (idiopathic pulmonary fibrosis) (HCC)   Hypertension   CAD, NATIVE VESSEL   Hyperlipidemia   SOB (shortness of breath)   Type 2 diabetes mellitus without complication (HCC)   Neuralgia, post-herpetic   Pleural effusion on right   Hospital Course: Taken from prior notes.   Shane Reid. is a 77 y.o. male with medical history significant of severe pulmonary fibrosis follows up with a pulmonologist, coronary artery disease status post 4 stent placement, COPD, diabetes mellitus with neuropathy, GERD, hyperlipidemia, hypertension, hypertensive heart disease who has been having gradual worsening of baseline shortness of breath for the last 1 month, due to continuous worsening shortness of breath EMS was called.  ED course: Upon arrival to the emergency room temperature 97.7 pulse 111 blood pressure 144/94 saturating 82 on 50%. Patient was initially/ placed on nonrebreather oxygenation and saturation was in the 70s and subsequently placed on BiPAP with improvement in saturation to the high 90s.  Chest x-ray obtained showed large right-sided pleural effusion as well as bilateral patchy opacities concerning for possible pneumonia/pulmonary edema.   Pulmonary was also consulted.  Chest tube was placed by pulmonary, postprocedural x-ray with right apical pneumothorax.  5/29: Able to wean to 8 L of oxygen.  Respiratory viral panel negative.  Preliminary blood cultures  negative.  Preliminary pleural fluid culture negative. Pleural fluid LDH elevated at 458, protein 4.2. CT chest today with kinked chest tube, right hydropneumothorax with small Hydro component and a moderate pneumo component.  Background advanced pulmonary fibrosis with progressive honeycomb lung disease and bronchiectasis.  Increased groundglass degeneration in the posterior left upper lobe which can be either due to interstitial disease or groundglass pneumonitis. Stable mediastinal and hilar adenopathy.  Concern of pulmonary hypertension.  Pulmonology think that he developed pneumothorax a while ago as he was having worsening symptoms for couple of months, effusion developed to cover that area as he is now experiencing trapped lung which did not expand after removal of fluid.  Persistent hydropneumothorax.  Recommending hospice.  Patient already established with outpatient palliative care/hospice.  Patient has very negative pleural space pressure, chest tube clamped as drainage was more than 1.5 L to prevent reexpansion edema.  His pleural pressure is consistent with nonexpandable/trapped lung.  Patient wants to go home with Pleurx catheter-pulmonary to reevaluate in the afternoon.  Patient will be high risk for any infection which can cause rehospitalization's.  He wants to go home with hospice  5/30: Vitals, labs and saturations stable at 4 to 5 L of oxygen.  No significant recollection of fluid despite clamping chest tube the whole day yesterday.  Pulmonary drained the fluid and removed chest tube.  Patient need to follow-up with pulmonary next week for Pleurx catheter placement. Hospice will help that.  Patient is being discharged home with hospice.  Guarded prognosis due to end-stage pulmonary fibrosis and trapped right lung.  He will continue on current medications and need to have a close follow-up with his providers for further recommendations.  Assessment and Plan: * Acute on  chronic hypoxic respiratory failure (HCC) Large right-sided pleural effusion.  Collapsed lung with hydropneumothorax. Patient uses 3 L of oxygen at baseline, currently on 8 L S/p chest tube placement by pulmonary-preliminary cultures negative. Concern of trapped lung with chronic pneumothorax with his end-stage underlying pulmonary fibrosis, resulted in pleural thickening and effusion.  Lung failed to expand with chest tube. Pulmonary is recommending home with hospice. Patient will decide about going home with chest tube versus Pleurx catheter as he will be high risk for recurrent effusions. 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 -Continue bronchodilators and BiPAP at night  Hypertension -Blood pressure within goal -Continue home amlodipine  CAD, NATIVE VESSEL S/p stent placement. -Continue home aspirin and statin  Hyperlipidemia -Continue statin  Type 2 diabetes mellitus without complication (HCC) Patient takes Jardiance and Actos at home. -Continue Jardiance -SSI   Pain control - [place] Controlled Substance Reporting System database was reviewed. and patient was instructed, not to drive, operate heavy machinery, perform activities at heights, swimming or participation in water activities or provide baby-sitting services while on Pain, Sleep and Anxiety Medications; until their outpatient Physician has advised to do so again. Also recommended to not to take more than prescribed Pain, Sleep and Anxiety Medications.  Consultants: Pulmonology Procedures performed: Chest tube Disposition: Hospice care Diet recommendation:  Discharge Diet Orders (From admission, onward)     Start     Ordered   10/26/22 0000  Diet - low sodium heart healthy        10/26/22 0947           Cardiac and Carb modified diet DISCHARGE MEDICATION: Allergies as of 10/26/2022        Reactions   Nintedanib Other (See Comments)   Metoprolol Other (See Comments)   Metoprolol Succinate Other (See Comments)   Reaction:  Unknown    Niacin Other (See Comments)   Reaction:  Unknown    Sertraline Hcl Other (See Comments)   Reaction:  Unknown    Sertraline Hcl Other (See Comments)        Medication List     STOP taking these medications    benzonatate 100 MG capsule Commonly known as: TESSALON   cefdinir 300 MG capsule Commonly known as: OMNICEF   HYDROcodone-acetaminophen 5-325 MG tablet Commonly known as: NORCO/VICODIN   lactulose 10 GM/15ML solution Commonly known as: CHRONULAC   LORazepam 0.5 MG tablet Commonly known as: ATIVAN   metFORMIN 1000 MG tablet Commonly known as: GLUCOPHAGE       TAKE these medications    Accu-Chek Guide Me w/Device Kit DX E11.9 (MEDICAID PREFERRED)   Accu-Chek Softclix Lancets lancets USE AS DIRECTED EVERY DAY   acetaminophen 325 MG tablet Commonly known as: TYLENOL 325 MG AS NEEDED ORALLY EVERY 6 HOURS AS NEEDED FOR PAIN/FEVER 30 DAYS   albuterol 108 (90 Base) MCG/ACT inhaler Commonly known as: VENTOLIN HFA Inhale 2 puffs into the lungs every 4 (four) hours as needed for wheezing.   albuterol (2.5 MG/3ML) 0.083% nebulizer solution Commonly known as: PROVENTIL Take 3 mLs (2.5 mg total) by nebulization every 6 (six) hours as needed.   ALPRAZolam 0.5 MG tablet Commonly known as: XANAX Take 0.5 mg by mouth every 8 (eight) hours as needed.   amLODipine 5 MG tablet Commonly known as: NORVASC Take 1 tablet (5 mg total) by mouth daily.   aspirin EC  81 MG tablet Take 81 mg by mouth every morning.   CENTRUM SILVER PO Take 1 tablet by mouth daily.   diclofenac Sodium 1 % Gel Commonly known as: VOLTAREN Apply 1 application topically 4 (four) times daily.   docusate sodium 100 MG capsule Commonly known as: COLACE Take 100 mg by mouth daily.   guaiFENesin-codeine 100-10 MG/5ML syrup Take 5 mLs by mouth  every 4 (four) hours as needed.   ipratropium-albuterol 0.5-2.5 (3) MG/3ML Soln Commonly known as: DUONEB Take 3 mLs by nebulization 3 (three) times daily.   Jardiance 25 MG Tabs tablet Generic drug: empagliflozin Take 25 mg by mouth daily.   Levemir FlexPen 100 UNIT/ML FlexPen Generic drug: insulin detemir Inject 10 Units into the skin at bedtime. What changed: See the new instructions.   nitroGLYCERIN 0.4 MG SL tablet Commonly known as: NITROSTAT Place 1 tablet (0.4 mg total) under the tongue every 5 (five) minutes as needed.   Oxycodone HCl 10 MG Tabs Take 10 mg by mouth every 6 (six) hours. What changed: Another medication with the same name was removed. Continue taking this medication, and follow the directions you see here.   pantoprazole 40 MG tablet Commonly known as: PROTONIX Take 1 tablet (40 mg total) by mouth daily.   pioglitazone 45 MG tablet Commonly known as: ACTOS Take 45 mg by mouth daily.   predniSONE 20 MG tablet Commonly known as: DELTASONE Take 1 tablet (20 mg total) by mouth daily with breakfast. What changed: Another medication with the same name was removed. Continue taking this medication, and follow the directions you see here.   pregabalin 50 MG capsule Commonly known as: LYRICA Take 100 mg by mouth 2 (two) times daily.   QUEtiapine 25 MG tablet Commonly known as: SEROQUEL Take 25 mg by mouth at bedtime.   rosuvastatin 10 MG tablet Commonly known as: CRESTOR Take 1 tablet (10 mg total) by mouth daily. Take 1 tablet (10 mg) by mouth once daily.   torsemide 20 MG tablet Commonly known as: DEMADEX as needed Orally daily for 30 days        Follow-up Information     Wroth, Sydnee Cabal, MD. Schedule an appointment as soon as possible for a visit in 1 week(s).   Specialty: Family Medicine Contact information: [address] [place] [address] [phone]         [name], MD. Schedule an appointment as soon as  possible for a visit in 1 week(s).   Specialty: Pulmonary Disease Contact information: [address] [place] [address] [phone]                Discharge Exam: Ceasar Mons Weights   10/24/22 1514  Weight: 64 kg   General.  Frail and malnourished elderly man, in no acute distress. Pulmonary.  Decreased breath sounds on right, normal respiratory effort. CV.  Regular rate and rhythm, no JVD, rub or murmur. Abdomen.  Soft, nontender, nondistended, BS positive. CNS.  Alert and oriented .  No focal neurologic deficit. Extremities.  No edema, no cyanosis, pulses intact and symmetrical. Psychiatry.  Judgment and insight appears normal.   Condition at discharge: stable  The results of significant diagnostics from this hospitalization (including imaging, microbiology, ancillary and laboratory) are listed below for reference.   Imaging Studies: DG Chest Port 1 View  Result Date: 10/26/2022 CLINICAL DATA:  Right pneumothorax EXAM: PORTABLE CHEST 1 VIEW COMPARISON:  Previous studies including the examination of 10/25/2022 FINDINGS:  There is interval decrease in size of the right pneumothorax. There is moderate residual right pneumothorax in the periphery of the right apex and right upper lung field. There is a pigtail right chest tube overlying the lateral aspect of the right lower lung field. Kink is noted in the distal course of the right chest tube. Increased interstitial markings in both lungs have not changed significantly. IMPRESSION: There is decrease in size of right pneumothorax with residual moderate right pneumothorax in the periphery of the right apex and right upper lung field. Electronically Signed   By: Ernie Avena M.D.   On: 10/26/2022 08:05   CT CHEST WO CONTRAST  Result Date: 10/25/2022 CLINICAL DATA:  Pulmonary fibrosis, with pigtail right chest tube placed early this morning for a large right pleural effusion, with follow-up chest film showing  evacuation of most of the pleural effusion but with a sizable right pneumothorax. EXAM: CT CHEST WITHOUT CONTRAST TECHNIQUE: Multidetector CT imaging of the chest was performed following the standard protocol without IV contrast. RADIATION DOSE REDUCTION: This exam was performed according to the departmental dose-optimization program which includes automated exposure control, adjustment of the mA and/or kV according to patient size and/or use of iterative reconstruction technique. COMPARISON:  Portable chest today at 12:09 a.m., portable chest yesterday at 11:07 p.m., CTA chest 02/17/2021 and CTA chest 07/23/2020. FINDINGS: Cardiovascular: A small pericardial effusion has developed since the prior studies. The cardiac size is normal. There are three-vessel coronary artery calcifications, old stenting of the LAD coronary artery and a probable prior stenting of the circumflex artery. There is atherosclerosis of the aorta and great vessels but there is no aortic aneurysm. The pulmonary trunk and main arteries are prominent as before indicating arterial hypertension with the pulmonary trunk 4 cm. Pulmonary veins are normal caliber. Mediastinum/Nodes: Stable mediastinal adenopathy with mildly enlarged lymph nodes in multiple locations. Largest is in the left-sided prevascular mediastinum measuring 3.2 x 1.4 cm on 2:55, stable. There are symmetric mildly prominent hilar lymph nodes better demonstrated with contrast but also not significantly changed. Thyroid gland and axillary spaces are unremarkable. Tracheomegaly appears similar and there are mild retained secretions and a chronic right posterolateral tracheal air diverticulum. The main bronchi are patent. No esophageal thickening. Lungs/Pleura: There is a pigtail right tube thoracostomy in place. The pigtail is in the posterolateral right chest gutter and the tube is kinked at the skin entry. There is a right hydropneumothorax. No appreciable mediastinal shift  comparing the current to the prior studies. The hydro component is small and layering posteriorly. The pneumo component is predominantly in the apical 1/3 of the chest but is also seen in the anterior and lateral lower thorax extending to portions the chest base. The pneumo component is estimated 40-50% of the right chest volume. Possibility of a bronchopleural fistula is not excluded. There is background advanced pulmonary fibrosis. There is progressively extensive and confluent honeycomb lung disease in the lower lobes, progressive bronchiectasis. Subpleural reticulation and honeycombing in the remaining lungs is less advanced and fairly similar to prior exams. There is increased ground-glass attenuation in the posterior left upper lobe which could be part of the interstitial disease or could reflect ground-glass pneumonitis. There is increased subpleural consolidation or atelectasis posteriorly in the right upper lobe. No other areas of confluent opacity are seen. There is no left pneumothorax or pleural effusion. Upper Abdomen: Multiple air containing cholesterol stones in the gallbladder are again noted, largest is 1.2 cm. There is no wall thickening  or biliary dilatation. No acute upper abdominal findings. The pancreas moderately atrophic. Musculoskeletal: Osteopenia and degenerative change thoracic spine. No chest wall mass is seen. No aggressive osseous lesion. There is no chest wall emphysema. IMPRESSION: 1. Right pigtail chest tube is kinked at the skin entry site. 2. Right hydropneumothorax with small hydro component and a moderate pneumo component, estimated 40-50% of the right chest volume. 3. No appreciable mediastinal shift comparing the current to the prior studies. Possibility of a bronchopleural fistula is not excluded. 4. Background advanced pulmonary fibrosis with progressive honeycomb lung disease and bronchiectasis in the lower lobes. 5. Increased ground-glass attenuation in the posterior left  upper lobe which could be part of the interstitial disease or could reflect ground-glass pneumonitis. 6. Increased subpleural consolidation or atelectasis posteriorly in the right upper lobe. 7. Stable mediastinal and hilar adenopathy. 8. Aortic and coronary artery atherosclerosis. 9. Prominent pulmonary trunk and main arteries consistent with arterial hypertension. 10. Cholelithiasis. Electronically Signed   By: Almira Bar M.D.   On: 10/25/2022 07:34   DG Chest Port 1 View  Result Date: 10/25/2022 CLINICAL DATA:  Chest tube in place EXAM: PORTABLE CHEST 1 VIEW COMPARISON:  10/24/2022 FINDINGS: Right chest tube again noted at the right lateral base. Chest tube appears kinked. Continued large right pneumothorax, unchanged. This likely is related to non expansion of the right lung following evacuation of the large right pleural effusion. Extensive bilateral airspace disease again noted, unchanged. IMPRESSION: No significant change in the size of the large ex vacuo right pneumothorax following evacuation of the large right pleural effusion. Diffuse bilateral airspace disease, unchanged. Electronically Signed   By: Charlett Nose M.D.   On: 10/25/2022 00:24   DG Chest Port 1 View  Result Date: 10/24/2022 CLINICAL DATA:  Chest tube placement EXAM: PORTABLE CHEST 1 VIEW COMPARISON:  10/24/2022 FINDINGS: Interval right basilar pigtail chest tube placement with subtotal evacuation of the right pleural effusion and development of a moderate right apical pneumothorax ex vacuo. Moderate residual pleural fluid noted dependently. Extensive superimposed diffuse pulmonary infiltrate persists in keeping with acute on chronic pulmonary infiltrates. No pneumothorax or pleural effusion. Cardiac size within normal limits. IMPRESSION: 1. Interval placement of right basilar pigtail chest tube with subtotal evacuation of the right pleural effusion and development of a moderate right apical pneumothorax ex vacuo. Moderate  residual pleural fluid. Electronically Signed   By: Helyn Numbers M.D.   On: 10/24/2022 23:19   DG Chest Port 1 View  Result Date: 10/24/2022 CLINICAL DATA:  Respiratory distress EXAM: PORTABLE CHEST 1 VIEW COMPARISON:  Chest radiograph dated 07/23/2020 FINDINGS: Markedly decreased right lung volume due to the large right pleural effusion. Increased bilateral patchy opacities superimposed on reticulations. No pneumothorax. The heart size and mediastinal contours are within normal limits. No acute osseous abnormality. IMPRESSION: 1. Markedly decreased right lung volume due to the large right pleural effusion. 2. Increased bilateral patchy opacities superimposed on reticulations, which may represent pulmonary edema or infection on a background of interstitial lung disease. Electronically Signed   By: Agustin Cree M.D.   On: 10/24/2022 16:35    Microbiology: Results for orders placed or performed during the hospital encounter of 10/24/22  Resp panel by RT-PCR (RSV, Flu A&B, Covid) Anterior Nasal Swab     Status: None   Collection Time: 10/24/22  3:18 PM   Specimen: Anterior Nasal Swab  Result Value Ref Range Status   SARS Coronavirus 2 by RT PCR NEGATIVE NEGATIVE Final  Comment: (NOTE) SARS-CoV-2 target nucleic acids are NOT DETECTED.  The SARS-CoV-2 RNA is generally detectable in upper respiratory specimens during the acute phase of infection. The lowest concentration of SARS-CoV-2 viral copies this assay can detect is 138 copies/mL. A negative result does not preclude SARS-Cov-2 infection and should not be used as the sole basis for treatment or other patient management decisions. A negative result may occur with  improper specimen collection/handling, submission of specimen other than nasopharyngeal swab, presence of viral mutation(s) within the areas targeted by this assay, and inadequate number of viral copies(<138 copies/mL). A negative result must be combined with clinical observations,  patient history, and epidemiological information. The expected result is Negative.  Fact Sheet for Patients:  BloggerCourse.com  Fact Sheet for Healthcare Providers:  SeriousBroker.it  This test is no t yet approved or cleared by the [place] FDA and  has been authorized for detection and/or diagnosis of SARS-CoV-2 by FDA under an Emergency Use Authorization (EUA). This EUA will remain  in effect (meaning this test can be used) for the duration of the COVID-19 declaration under Section 564(b)(1) of the Act, 21 U.S.C.section 360bbb-3(b)(1), unless the authorization is terminated  or revoked sooner.       Influenza A by PCR NEGATIVE NEGATIVE Final   Influenza B by PCR NEGATIVE NEGATIVE Final    Comment: (NOTE) The Xpert Xpress SARS-CoV-2/FLU/RSV plus assay is intended as an aid in the diagnosis of influenza from Nasopharyngeal swab specimens and should not be used as a sole basis for treatment. Nasal washings and aspirates are unacceptable for Xpert Xpress SARS-CoV-2/FLU/RSV testing.  Fact Sheet for Patients: BloggerCourse.com  Fact Sheet for Healthcare Providers: SeriousBroker.it  This test is not yet approved or cleared by the [place] FDA and has been authorized for detection and/or diagnosis of SARS-CoV-2 by FDA under an Emergency Use Authorization (EUA). This EUA will remain in effect (meaning this test can be used) for the duration of the COVID-19 declaration under Section 564(b)(1) of the Act, 21 U.S.C. section 360bbb-3(b)(1), unless the authorization is terminated or revoked.     Resp Syncytial Virus by PCR NEGATIVE NEGATIVE Final    Comment: (NOTE) Fact Sheet for Patients: BloggerCourse.com  Fact Sheet for Healthcare Providers: SeriousBroker.it  This test is not yet approved or cleared by the [place] FDA and has been authorized for detection and/or diagnosis of SARS-CoV-2 by FDA under an Emergency Use Authorization (EUA). This EUA will remain in effect (meaning this test can be used) for the duration of the COVID-19 declaration under Section 564(b)(1) of the Act, 21 U.S.C. section 360bbb-3(b)(1), unless the authorization is terminated or revoked.  Performed at [facility], [address] [place], [place] [address]   Blood culture (routine x 2)     Status: None (Preliminary result)   Collection Time: 10/24/22  5:36 PM   Specimen: BLOOD  Result Value Ref Range Status   Specimen Description BLOOD BLOOD RIGHT FOREARM  Final   Special Requests   Final    BOTTLES DRAWN AEROBIC AND ANAEROBIC Blood Culture adequate volume   Culture   Final    NO GROWTH 2 DAYS Performed at [facility], [address] [place], [place] [address]    Report Status PENDING  Incomplete  Blood culture (routine x 2)     Status: None (Preliminary result)   Collection Time: 10/24/22  5:38 PM   Specimen: BLOOD  Result Value Ref Range Status   Specimen Description BLOOD BLOOD  RIGHT ARM  Final   Special Requests   Final    BOTTLES DRAWN AEROBIC AND ANAEROBIC Blood Culture adequate volume   Culture   Final    NO GROWTH 2 DAYS Performed at [facility], [address] [place], [place] [address]    Report Status PENDING  Incomplete  Respiratory (~20 pathogens) panel by PCR     Status: None   Collection Time: 10/24/22  9:20 PM   Specimen: Nasopharyngeal Swab; Respiratory  Result Value Ref Range Status   Adenovirus NOT DETECTED NOT DETECTED Final   Coronavirus 229E NOT DETECTED NOT DETECTED Final    Comment: (NOTE) The Coronavirus on the Respiratory Panel, DOES NOT test for the novel  Coronavirus (2019 nCoV)    Coronavirus HKU1 NOT DETECTED NOT DETECTED Final   Coronavirus NL63 NOT DETECTED NOT DETECTED Final   Coronavirus OC43 NOT DETECTED NOT DETECTED Final    Metapneumovirus NOT DETECTED NOT DETECTED Final   Rhinovirus / Enterovirus NOT DETECTED NOT DETECTED Final   Influenza A NOT DETECTED NOT DETECTED Final   Influenza B NOT DETECTED NOT DETECTED Final   Parainfluenza Virus 1 NOT DETECTED NOT DETECTED Final   Parainfluenza Virus 2 NOT DETECTED NOT DETECTED Final   Parainfluenza Virus 3 NOT DETECTED NOT DETECTED Final   Parainfluenza Virus 4 NOT DETECTED NOT DETECTED Final   Respiratory Syncytial Virus NOT DETECTED NOT DETECTED Final   Bordetella pertussis NOT DETECTED NOT DETECTED Final   Bordetella Parapertussis NOT DETECTED NOT DETECTED Final   Chlamydophila pneumoniae NOT DETECTED NOT DETECTED Final   Mycoplasma pneumoniae NOT DETECTED NOT DETECTED Final    Comment: Performed at [facility], [address] [place], [place] [address]  Pleural fluid culture w Gram Stain     Status: None (Preliminary result)   Collection Time: 10/24/22 10:55 PM   Specimen: Pleural Fluid  Result Value Ref Range Status   Specimen Description   Final    PLEURAL RIGHT LUNG Performed at [facility], [address], [place], [place] [id number]    Special Requests   Final    NONE Performed at [facility], [address] [place], [place] [address]    Gram Stain   Final    RARE WBC PRESENT, PREDOMINANTLY MONONUCLEAR NO ORGANISMS SEEN    Culture   Final    NO GROWTH 1 DAY Performed at [facility], [address] [place], [place] [address]    Report Status PENDING  Incomplete    Labs: CBC: Recent Labs  Lab 10/24/22 1527 10/25/22 0418 10/26/22 0426  WBC 11.7* 10.3 11.4*  NEUTROABS 11.0* 9.5* 9.6*  HGB 15.0 14.5 13.4  HCT 47.6 46.3 43.4  MCV 89.3 90.6 90.8  PLT 311 247 242   Basic Metabolic Panel: Recent Labs  Lab 10/24/22 1527 10/25/22 0803 10/26/22 0426  NA 138 143 139  K 4.6 5.4* 4.1  CL 98 99 100  CO2 29 31 31   GLUCOSE 263* 176* 120*  BUN 21 34* 30*  CREATININE 0.90 1.03 0.71  CALCIUM 9.1 9.3  8.3*   Liver Function Tests: Recent Labs  Lab 10/24/22 1518  PROT 6.9   CBG: Recent Labs  Lab 10/25/22 0817 10/25/22 1140 10/25/22 1744 10/25/22 2129 10/26/22 0725  GLUCAP 238* 130* 308* 108* 109*    Discharge time spent: greater than 30 minutes.  This record has been created using Conservation officer, historic buildings. Errors have been sought and corrected,but may not always  be located. Such creation errors do not reflect on the standard of care.   Signed: Arnetha Courser, MD [facility] 10/26/2022

## 2022-10-27 ENCOUNTER — Other Ambulatory Visit: Payer: 59

## 2022-10-27 LAB — BODY FLUID CULTURE W GRAM STAIN: Culture: NO GROWTH

## 2022-10-28 LAB — BODY FLUID CULTURE W GRAM STAIN

## 2022-10-29 LAB — CULTURE, BLOOD (ROUTINE X 2)
Culture: NO GROWTH
Culture: NO GROWTH
Special Requests: ADEQUATE
Special Requests: ADEQUATE

## 2022-10-30 ENCOUNTER — Telehealth: Payer: Self-pay | Admitting: Student in an Organized Health Care Education/Training Program

## 2022-10-30 LAB — CYTOLOGY - NON PAP

## 2022-10-30 NOTE — Telephone Encounter (Signed)
Will hold message to ensure f/u. Will contact patient to remind him of appt.

## 2022-10-30 NOTE — Telephone Encounter (Signed)
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 I also called his son and updated him regarding the result. Patient will be presenting on Wednesday for PleurX catheter placement.  [name], MD [facility] 10/30/2022 6:16 PM

## 2022-10-31 NOTE — Telephone Encounter (Signed)
Spoke to patient and reminded him of catheter placement that is scheduled for tomorrow. He confirmed date/time.

## 2022-11-01 ENCOUNTER — Ambulatory Visit
Admission: RE | Admit: 2022-11-01 | Discharge: 2022-11-01 | Disposition: A | Discharge status: Home / Self Care | Attending: Student in an Organized Health Care Education/Training Program | Admitting: Student in an Organized Health Care Education/Training Program

## 2022-11-01 ENCOUNTER — Encounter: Payer: Self-pay | Admitting: Student in an Organized Health Care Education/Training Program

## 2022-11-01 ENCOUNTER — Encounter
Admission: RE | Disposition: A | Discharge status: Home / Self Care | Payer: Self-pay | Source: Home / Self Care | Attending: Student in an Organized Health Care Education/Training Program

## 2022-11-01 ENCOUNTER — Ambulatory Visit

## 2022-11-01 ENCOUNTER — Encounter: Payer: Self-pay | Admitting: Anesthesiology

## 2022-11-01 ENCOUNTER — Other Ambulatory Visit: Payer: Self-pay

## 2022-11-01 DIAGNOSIS — J90 Pleural effusion, not elsewhere classified: Secondary | ICD-10-CM

## 2022-11-01 DIAGNOSIS — R0600 Dyspnea, unspecified: Secondary | ICD-10-CM | POA: Diagnosis present

## 2022-11-01 DIAGNOSIS — I110 Hypertensive heart disease with heart failure: Secondary | ICD-10-CM | POA: Diagnosis not present

## 2022-11-01 HISTORY — PX: CHEST TUBE INSERTION: SHX231

## 2022-11-01 LAB — GLUCOSE, CAPILLARY: Glucose-Capillary: 251 mg/dL — ABNORMAL HIGH (ref 70–99)

## 2022-11-01 SURGERY — INSERTION, PLEURAL DRAINAGE CATHETER
Anesthesia: LOCAL | Laterality: Right

## 2022-11-01 MED ORDER — ACETAMINOPHEN 500 MG PO TABS: 1000.0000 mg | ORAL_TABLET | Freq: Four times a day (QID) | ORAL | Status: DC | PRN

## 2022-11-01 MED ORDER — LIDOCAINE-EPINEPHRINE 1 %-1:100000 IJ SOLN
20.0000 mL | Freq: Once | INTRAMUSCULAR | Status: DC
  Filled 2022-11-01: qty 20

## 2022-11-01 NOTE — Discharge Instructions (Addendum)
Today, we placed a PleurX catheter to help drain your fluid. Your hospice team should drain this every other day, and once the output is < 150 mL, this can be decreased to every 2-3 days. If the output is < 150 mL for over 10 days, please call our office.

## 2022-11-01 NOTE — Interval H&P Note (Signed)
S: patient feels increased shortness of breath following discharge  O: Vitals:   11/01/22 1036  BP: 119/79  Pulse: 100  Resp: (!) 22  Temp: (!) 96.3 F (35.7 C)  SpO2: 92%   General: ill-appearing, moderate respiratory distress Eyes: Anicteric, no conjunctival pallor HEENT: Mucous membranes moist, no evidence of postnasal drip Respiratory: Trachea is midline, increased respiratory rate. Decreased air entry over the right base. Bilateral crackles. Cardiovascular: Heart with regular rate and rhythm, normal S1 and S2, no murmurs, rubs, or gallops Gastrointestinal: Normoactive bowel sounds, soft and nontender Neuro: Alert and oriented, no gross focal deficits  A/P:  77 year old male with history of IPF/UIP, not on anti-fibrotics, on hospice, who presented to the hospital with respiratory distress, acute on chronic hypoxic respiratory failure secondary to right sided pleural effusion compounding his underlying interstitial lung disease. Pleural fluid was drained with small bore pig tail catheter, with pneumothorax ex-vacuo secondary to lung entrapment. Pleural fluid cytology has returned positive for adenocarcinoma of pulmonary origin. He is here for the placement of an indwelling pleural catheter for pleural fluid drainage for palliation. I have explained to the patient the risks and benefits of having the pleural catheter including risk of infection, risk of bleeding, and risk of pain. Given the lung did not re-expand during hospitalization (and ex-vacuo developed), he is unlikely to have pleurodesis or pleural approximation. He did feel significant symptomatic improvement following fluid drainage and would benefit from the PleurX placement for purposes of symptom management. Patient is on hospice and the hospice team will be following along for pleural drainage.  [name], MD [facility] 11/01/2022 11:14 AM

## 2022-11-01 NOTE — Progress Notes (Signed)
MD [facility] of the post procedure chest x-ray results.

## 2022-11-01 NOTE — Op Note (Signed)
PleurX Insertion Procedure Note  Shane Reid  [id number]  [birth year]  Date:11/01/22  Time:12:29 PM   Provider Performing:Idelia Caudell  Procedure: PleurX Tunneled Pleural Catheter Placement (32550)  Indication(s) Relief of dyspnea from recurrent effusion  Consent Risks of the procedure as well as the alternatives and risks of each were explained to the patient and/or caregiver.  Consent for the procedure was obtained.   Anesthesia Topical only with 1% lidocaine    Time Out Verified patient identification, verified procedure, site/side was marked, verified correct patient position, special equipment/implants available, medications/allergies/relevant history reviewed, required imaging and test results available.   Sterile Technique Maximal sterile technique including sterile barrier drape, hand hygiene, sterile gown, sterile gloves, mask, hair covering.    Procedure Description Ultrasound used to identify appropriate pleural anatomy for placement and overlying skin marked.  Area of drainage cleaned and draped in sterile fashion.   Lidocaine was used to anesthetize the skin and subcutaneous tissue.   1.5 cm incision made overlying fluid and another about 5 cm anterior to this along chest wall.  PleurX catheter inserted in usual sterile fashion using modified seldinger technique.  Interrupted silk sutures placed at catheter insertion and tunneling points which will be removed at later date.  PleurX catheter then hooked to suction.  After fluid aspirated, pleurX capped and sterile dressing applied.   Complications/Tolerance None; patient tolerated the procedure well. Chest X-ray is ordered to confirm no post-procedural complication.   EBL Minimal   Specimen(s) none   [name], MD [facility] 11/01/2022 12:29 PM

## 2022-11-02 ENCOUNTER — Encounter: Payer: Self-pay | Admitting: Student in an Organized Health Care Education/Training Program

## 2022-11-02 ENCOUNTER — Other Ambulatory Visit: Payer: 59

## 2022-11-02 LAB — CHOLESTEROL, BODY FLUID: Cholesterol, Fluid: 75 mg/dL

## 2022-11-08 ENCOUNTER — Ambulatory Visit: Payer: Medicare Other | Admitting: Cardiovascular Disease

## 2022-11-09 ENCOUNTER — Ambulatory Visit: Payer: Self-pay | Admitting: Cardiology

## 2022-11-18 ENCOUNTER — Other Ambulatory Visit: Payer: Self-pay | Admitting: Cardiovascular Disease

## 2022-12-28 DEATH — deceased
